# Patient Record
Sex: Female | Born: 1975
Health system: Southern US, Community
[De-identification: ages and names within clinical notes are randomized; demographics above are authoritative.]

## PROBLEM LIST (undated history)

## (undated) DIAGNOSIS — J310 Chronic rhinitis: Secondary | ICD-10-CM

## (undated) DIAGNOSIS — I10 Essential (primary) hypertension: Secondary | ICD-10-CM

## (undated) DIAGNOSIS — M1990 Unspecified osteoarthritis, unspecified site: Secondary | ICD-10-CM

## (undated) DIAGNOSIS — K219 Gastro-esophageal reflux disease without esophagitis: Secondary | ICD-10-CM

## (undated) DIAGNOSIS — N920 Excessive and frequent menstruation with regular cycle: Secondary | ICD-10-CM

## (undated) DIAGNOSIS — L309 Dermatitis, unspecified: Secondary | ICD-10-CM

## (undated) DIAGNOSIS — R21 Rash and other nonspecific skin eruption: Secondary | ICD-10-CM

## (undated) DIAGNOSIS — Z9889 Other specified postprocedural states: Secondary | ICD-10-CM

## (undated) DIAGNOSIS — L292 Pruritus vulvae: Secondary | ICD-10-CM

## (undated) DIAGNOSIS — M069 Rheumatoid arthritis, unspecified: Secondary | ICD-10-CM

## (undated) DIAGNOSIS — M797 Fibromyalgia: Secondary | ICD-10-CM

## (undated) DIAGNOSIS — D649 Anemia, unspecified: Secondary | ICD-10-CM

## (undated) DIAGNOSIS — D219 Benign neoplasm of connective and other soft tissue, unspecified: Secondary | ICD-10-CM

## (undated) DIAGNOSIS — N946 Dysmenorrhea, unspecified: Secondary | ICD-10-CM

## (undated) DIAGNOSIS — IMO0001 Reserved for inherently not codable concepts without codable children: Secondary | ICD-10-CM

## (undated) DIAGNOSIS — R112 Nausea with vomiting, unspecified: Secondary | ICD-10-CM

## (undated) DIAGNOSIS — E559 Vitamin D deficiency, unspecified: Secondary | ICD-10-CM

## (undated) HISTORY — PX: ESOPHAGOGASTRODUODENOSCOPY ENDOSCOPY: SHX5814

## (undated) HISTORY — DX: Benign neoplasm of connective and other soft tissue, unspecified: D21.9

## (undated) HISTORY — DX: Pruritus vulvae: L29.2

## (undated) HISTORY — DX: Dysmenorrhea, unspecified: N94.6

## (undated) HISTORY — DX: Reserved for inherently not codable concepts without codable children: IMO0001

## (undated) HISTORY — DX: Gastro-esophageal reflux disease without esophagitis: K21.9

## (undated) HISTORY — DX: Excessive and frequent menstruation with regular cycle: N92.0

## (undated) HISTORY — DX: Vitamin D deficiency, unspecified: E55.9

## (undated) HISTORY — DX: Chronic rhinitis: J31.0

## (undated) HISTORY — DX: Anemia, unspecified: D64.9

## (undated) HISTORY — DX: Dermatitis, unspecified: L30.9

## (undated) HISTORY — PX: WISDOM TOOTH EXTRACTION: SHX21

## (undated) HISTORY — DX: Rash and other nonspecific skin eruption: R21

---

## 2001-05-26 ENCOUNTER — Emergency Department (HOSPITAL_COMMUNITY): Admission: EM | Admit: 2001-05-26 | Discharge: 2001-05-26 | Payer: Self-pay | Admitting: Emergency Medicine

## 2001-05-26 ENCOUNTER — Encounter: Payer: Self-pay | Admitting: Emergency Medicine

## 2001-06-09 ENCOUNTER — Encounter (HOSPITAL_COMMUNITY): Admission: RE | Admit: 2001-06-09 | Discharge: 2001-07-09 | Payer: Self-pay | Admitting: Family Medicine

## 2006-03-14 ENCOUNTER — Encounter: Admission: RE | Admit: 2006-03-14 | Discharge: 2006-03-14 | Payer: Self-pay | Admitting: Rheumatology

## 2006-04-29 ENCOUNTER — Encounter: Admission: RE | Admit: 2006-04-29 | Discharge: 2006-04-29 | Payer: Self-pay | Admitting: Rheumatology

## 2008-02-06 ENCOUNTER — Other Ambulatory Visit: Admission: RE | Admit: 2008-02-06 | Discharge: 2008-02-06 | Payer: Self-pay | Admitting: Obstetrics and Gynecology

## 2009-02-17 ENCOUNTER — Other Ambulatory Visit: Admission: RE | Admit: 2009-02-17 | Discharge: 2009-02-17 | Payer: Self-pay | Admitting: Obstetrics & Gynecology

## 2010-07-30 ENCOUNTER — Emergency Department (HOSPITAL_COMMUNITY)
Admission: EM | Admit: 2010-07-30 | Discharge: 2010-07-30 | Payer: Self-pay | Source: Home / Self Care | Admitting: Emergency Medicine

## 2010-08-02 ENCOUNTER — Ambulatory Visit (HOSPITAL_COMMUNITY)
Admission: RE | Admit: 2010-08-02 | Discharge: 2010-08-02 | Payer: Self-pay | Source: Home / Self Care | Attending: Family Medicine | Admitting: Family Medicine

## 2010-08-10 ENCOUNTER — Ambulatory Visit (HOSPITAL_COMMUNITY)
Admission: RE | Admit: 2010-08-10 | Discharge: 2010-08-10 | Payer: Self-pay | Source: Home / Self Care | Attending: Internal Medicine | Admitting: Internal Medicine

## 2010-08-10 ENCOUNTER — Ambulatory Visit: Admit: 2010-08-10 | Payer: Self-pay | Admitting: Internal Medicine

## 2010-08-28 NOTE — Op Note (Signed)
  NAMEMELAYA, Burton             ACCOUNT NO.:  [id number]  MEDICAL RECORD NO.:  [id number]          PATIENT TYPE:  AMB  LOCATION:  DAY                           FACILITY:  [facility]  PHYSICIAN:  Lionel December, M.D.    DATE OF BIRTH:  02-12-1976  DATE OF PROCEDURE:  08/10/2010 DATE OF DISCHARGE:                              OPERATIVE REPORT   PROCEDURE:  Esophagogastroduodenoscopy.  INDICATIONS:  Karen Burton is a 35 year old African American female with history of rheumatoid arthritis who has been experiencing mid epigastric pain for the last 2 weeks associated with nausea and early satiety.  She is on PPI chronically for GERD with good control of her heartburn.  Dr. Gerda Diss increased her PPI to double dose, but without symptomatic relief. Her LFTs have been normal and hepatobiliary ultrasound was also unremarkable.  She is on Celebrex and she generally takes 200 mg once a day with meals.  She denies melena or rectal bleeding.  She is undergoing diagnostic EGD.  Procedure risks were reviewed with the patient and informed consent was obtained.  MEDICATIONS FOR CONSCIOUS SEDATION: 1. Cetacaine spray for oropharyngeal topical anesthesia. 2. Demerol 50 mg IV. 3. Versed 5 mg IV in divided dose.  FINDINGS:  Procedure performed in endoscopy suite.  The patient's vital signs and O2 sats were monitored during the procedure and remained stable.  The patient was placed in left lateral recumbent position and Pentax videoscope was passed through oropharynx without any difficulty into esophagus.  Esophagus, mucosa of the esophagus normal.  GE junction was located at 39-cm from the incisors and was unremarkable.  Stomach, it was empty and distended very well with insufflation.  Folds of the proximal stomach are normal.  Examination of mucosa at body was normal.  In the antrum, she had numerous erosions, but none had a depth. Pyloric channel was patent.  Few more erosions were noted over  the angularis.  Fundus and cardia were unremarkable.  Duodenum.  In the bulb, there were few small erosions along with few more erosions involving the second part of the duodenum.  Endoscope was withdrawn.  On the way out, biopsy was taken from these antral erosions.  The patient tolerated the procedure well.  FINAL DIAGNOSES: 1. Multiple antral erosions, possibly related to NSAID therapy would     need to rule out H. pylori infection or other etiologies. 2. Few bulbar and postbulbar erosions.  RECOMMENDATIONS: 1. She will continue omeprazole at 20 mg b.i.d. and for the time-being     continue Celebrex 200 mg once a day with meals. 2. I will be contacting the patient with results of biopsy and further     recommendations.     Lionel December, M.D.     NR/MEDQ  D:  08/10/2010  T:  08/11/2010  Job:  [id number]  cc:   Lorin Picket A. Gerda Diss, MD Fax: [phone]  Pollyann Savoy, M.D. Fax: [phone]  Electronically Signed by Lionel December M.D. on 08/28/2010 07:19:08 AM

## 2010-10-09 LAB — URINALYSIS, ROUTINE W REFLEX MICROSCOPIC
Bilirubin Urine: NEGATIVE
Glucose, UA: NEGATIVE mg/dL
Hgb urine dipstick: NEGATIVE
Ketones, ur: 15 mg/dL — AB
Leukocytes, UA: NEGATIVE
Nitrite: NEGATIVE
Protein, ur: 30 mg/dL — AB
Specific Gravity, Urine: >1.03 — ABNORMAL HIGH (ref 1.005–1.030)
Urobilinogen, UA: 0.2 mg/dL (ref 0.0–1.0)
pH: 6 (ref 5.0–8.0)

## 2010-10-09 LAB — URINE MICROSCOPIC-ADD ON

## 2010-12-20 ENCOUNTER — Other Ambulatory Visit: Payer: Self-pay | Admitting: Adult Health

## 2010-12-20 ENCOUNTER — Other Ambulatory Visit (HOSPITAL_COMMUNITY)
Admission: RE | Admit: 2010-12-20 | Discharge: 2010-12-20 | Disposition: A | Discharge status: Home / Self Care | Payer: BC Managed Care – PPO | Source: Ambulatory Visit | Attending: Obstetrics and Gynecology | Admitting: Obstetrics and Gynecology

## 2010-12-20 ENCOUNTER — Other Ambulatory Visit: Payer: Self-pay | Admitting: Obstetrics & Gynecology

## 2010-12-20 DIAGNOSIS — Z01419 Encounter for gynecological examination (general) (routine) without abnormal findings: Secondary | ICD-10-CM | POA: Insufficient documentation

## 2010-12-20 DIAGNOSIS — Z113 Encounter for screening for infections with a predominantly sexual mode of transmission: Secondary | ICD-10-CM | POA: Insufficient documentation

## 2010-12-20 DIAGNOSIS — N852 Hypertrophy of uterus: Secondary | ICD-10-CM

## 2010-12-26 ENCOUNTER — Ambulatory Visit (HOSPITAL_COMMUNITY): Payer: BC Managed Care – PPO

## 2010-12-26 ENCOUNTER — Ambulatory Visit (HOSPITAL_COMMUNITY)
Admission: RE | Admit: 2010-12-26 | Discharge: 2010-12-26 | Disposition: A | Discharge status: Home / Self Care | Payer: BC Managed Care – PPO | Source: Ambulatory Visit | Attending: Obstetrics & Gynecology | Admitting: Obstetrics & Gynecology

## 2010-12-26 DIAGNOSIS — N852 Hypertrophy of uterus: Secondary | ICD-10-CM | POA: Insufficient documentation

## 2010-12-26 DIAGNOSIS — D259 Leiomyoma of uterus, unspecified: Secondary | ICD-10-CM | POA: Insufficient documentation

## 2012-12-08 ENCOUNTER — Other Ambulatory Visit: Payer: Self-pay | Admitting: Family Medicine

## 2013-01-12 ENCOUNTER — Ambulatory Visit (INDEPENDENT_AMBULATORY_CARE_PROVIDER_SITE_OTHER): Payer: BC Managed Care – PPO | Admitting: Family Medicine

## 2013-01-12 ENCOUNTER — Encounter: Payer: Self-pay | Admitting: Family Medicine

## 2013-01-12 VITALS — BP 124/84 | Wt 148.0 lb

## 2013-01-12 DIAGNOSIS — I10 Essential (primary) hypertension: Secondary | ICD-10-CM | POA: Insufficient documentation

## 2013-01-12 DIAGNOSIS — M069 Rheumatoid arthritis, unspecified: Secondary | ICD-10-CM

## 2013-01-12 DIAGNOSIS — R21 Rash and other nonspecific skin eruption: Secondary | ICD-10-CM | POA: Insufficient documentation

## 2013-01-12 DIAGNOSIS — K219 Gastro-esophageal reflux disease without esophagitis: Secondary | ICD-10-CM

## 2013-01-12 MED ORDER — OMEPRAZOLE 20 MG PO CPDR: 20.0000 mg | DELAYED_RELEASE_CAPSULE | Freq: Every day | ORAL | Status: DC

## 2013-01-12 MED ORDER — TRIAMCINOLONE ACETONIDE 0.1 % EX CREA: TOPICAL_CREAM | Freq: Two times a day (BID) | CUTANEOUS | Status: DC

## 2013-01-12 MED ORDER — AMLODIPINE BESYLATE 5 MG PO TABS: 5.0000 mg | ORAL_TABLET | Freq: Every day | ORAL | Status: DC

## 2013-01-12 MED ORDER — HYDROCHLOROTHIAZIDE 12.5 MG PO CAPS: 12.5000 mg | ORAL_CAPSULE | ORAL | Status: DC

## 2013-01-12 NOTE — Patient Instructions (Signed)
Try to cut down salt intake

## 2013-01-12 NOTE — Progress Notes (Signed)
  Subjective:    Patient ID: Karen Burton, female    DOB: 1975/09/27, 37 y.o.   MRN: [id number]  Hypertension This is a chronic problem. The current episode started more than 1 year ago. The problem has been waxing and waning since onset. Associated symptoms include malaise/fatigue. Pertinent negatives include no blurred vision or chest pain. There are no associated agents to hypertension. Past treatments include calcium channel blockers and diuretics. The current treatment provides moderate improvement. Compliance problems include exercise and diet.  There is no history of angina or CAD/MI. There is no history of chronic renal disease.   Not exercising regularly these days due to rheum arthritis Reflux only sig with tomto sauce. Patient finds that the omeprazole definitely helps her.   patient has had a particularly on her neck. Pruritic in nature. Steroid creams helped considerably in the past.  Ongoing challenges with rheumatoid arthritis. Continue injection. Handling it reasonably well. flareup of her eczema recently.  Review of Systems  Constitutional: Positive for malaise/fatigue.  Eyes: Negative for blurred vision.  Cardiovascular: Negative for chest pain.  ROS otherwise negative      Objective:   Physical Exam    alert no acute distress. HEENT normal. Vitals reviewed. Blood pressure 124/80 on repeat. Lungs clear. Heart regular rate and rhythm. Abdomen benign. Skin reveals eczema changes on the neck.     Assessment & Plan:   impression #1 hypertension good control. #2 reflux clinically stable while on medicines. #3 eczema ongoing with need for refill on meds. Plan diet exercise discussed in encourage. Try to cut down salt intake. Meds refilled. Avoid tomato products. Triamcinolone cream twice a day to affected area. Check every 6 months. Rheumatologist follows blood work closely. WSL

## 2013-02-20 ENCOUNTER — Ambulatory Visit (INDEPENDENT_AMBULATORY_CARE_PROVIDER_SITE_OTHER): Payer: BC Managed Care – PPO | Admitting: Family Medicine

## 2013-02-20 ENCOUNTER — Encounter: Payer: Self-pay | Admitting: Family Medicine

## 2013-02-20 VITALS — BP 138/86 | Temp 98.4°F | Wt 148.0 lb

## 2013-02-20 DIAGNOSIS — Z23 Encounter for immunization: Secondary | ICD-10-CM

## 2013-02-20 DIAGNOSIS — L0291 Cutaneous abscess, unspecified: Secondary | ICD-10-CM

## 2013-02-20 DIAGNOSIS — L0390 Cellulitis, unspecified: Secondary | ICD-10-CM

## 2013-02-20 MED ORDER — HYDROCODONE-ACETAMINOPHEN 5-325 MG PO TABS: 1.0000 | ORAL_TABLET | ORAL | Status: DC | PRN

## 2013-02-20 MED ORDER — DOXYCYCLINE HYCLATE 100 MG PO CAPS: 100.0000 mg | ORAL_CAPSULE | Freq: Two times a day (BID) | ORAL | Status: DC

## 2013-02-20 NOTE — Progress Notes (Signed)
Patient ID: Karen Burton, female   DOB: 1976/05/30, 37 y.o.   MRN: [id number] This patient comes in with the enlarged area tender on the chin causes pain discomfort soreness swelling she denies any injury with it. She states that it is very tender painful keeping her awake at night. Progressive over the past several days. PMH benign. Family history benign. No history of MRSA. She treated it to a spider bite. On physical examination cheeks are normal eardrums normal throat normal neck no masses she does have an enlarged abscess on the left side of the chin.  Procedure-consent was obtained. 1% lidocaine with epinephrine injected into the area. Less than a half cc. #11 blade was used after prepping with Betadine. Small incision was made with a large amount of pus. Culture was sent. Bandage was put on by the nurse. Tetanus shot given.  Abscess-should gradually get better now that it has been drained. Dr. Cecil Cobbs from twice a day for the next 7-10 days await culture warning signs were discussed followup if ongoing trouble cautioned pain medication causes drowsiness cautioned drowsiness.

## 2013-02-23 LAB — WOUND CULTURE: Gram Stain: NONE SEEN

## 2013-03-04 ENCOUNTER — Encounter: Payer: Self-pay | Admitting: *Deleted

## 2013-07-29 ENCOUNTER — Telehealth: Payer: Self-pay | Admitting: Family Medicine

## 2013-07-29 NOTE — Telephone Encounter (Signed)
error 

## 2013-08-15 ENCOUNTER — Encounter (HOSPITAL_COMMUNITY): Payer: Self-pay | Admitting: Emergency Medicine

## 2013-08-15 ENCOUNTER — Emergency Department (HOSPITAL_COMMUNITY)
Admission: EM | Admit: 2013-08-15 | Discharge: 2013-08-15 | Disposition: A | Discharge status: Home / Self Care | Payer: BC Managed Care – PPO | Attending: Emergency Medicine | Admitting: Emergency Medicine

## 2013-08-15 DIAGNOSIS — Z8719 Personal history of other diseases of the digestive system: Secondary | ICD-10-CM | POA: Insufficient documentation

## 2013-08-15 DIAGNOSIS — Z792 Long term (current) use of antibiotics: Secondary | ICD-10-CM | POA: Insufficient documentation

## 2013-08-15 DIAGNOSIS — Z8709 Personal history of other diseases of the respiratory system: Secondary | ICD-10-CM | POA: Insufficient documentation

## 2013-08-15 DIAGNOSIS — K219 Gastro-esophageal reflux disease without esophagitis: Secondary | ICD-10-CM | POA: Insufficient documentation

## 2013-08-15 DIAGNOSIS — Z79899 Other long term (current) drug therapy: Secondary | ICD-10-CM | POA: Insufficient documentation

## 2013-08-15 DIAGNOSIS — Z8639 Personal history of other endocrine, nutritional and metabolic disease: Secondary | ICD-10-CM | POA: Insufficient documentation

## 2013-08-15 DIAGNOSIS — K644 Residual hemorrhoidal skin tags: Secondary | ICD-10-CM | POA: Insufficient documentation

## 2013-08-15 DIAGNOSIS — Z872 Personal history of diseases of the skin and subcutaneous tissue: Secondary | ICD-10-CM | POA: Insufficient documentation

## 2013-08-15 DIAGNOSIS — IMO0002 Reserved for concepts with insufficient information to code with codable children: Secondary | ICD-10-CM | POA: Insufficient documentation

## 2013-08-15 DIAGNOSIS — M129 Arthropathy, unspecified: Secondary | ICD-10-CM | POA: Insufficient documentation

## 2013-08-15 DIAGNOSIS — Z862 Personal history of diseases of the blood and blood-forming organs and certain disorders involving the immune mechanism: Secondary | ICD-10-CM | POA: Insufficient documentation

## 2013-08-15 HISTORY — DX: Unspecified osteoarthritis, unspecified site: M19.90

## 2013-08-15 HISTORY — DX: Fibromyalgia: M79.7

## 2013-08-15 NOTE — ED Notes (Signed)
Rectal bleeding, thinks it may be hemorrhoids but not sure

## 2013-08-15 NOTE — Discharge Instructions (Signed)
Hemorrhoids Hemorrhoids are swollen veins around the rectum or anus. There are two types of hemorrhoids:   Internal hemorrhoids. These occur in the veins just inside the rectum. They may poke through to the outside and become irritated and painful.  External hemorrhoids. These occur in the veins outside the anus and can be felt as a painful swelling or hard lump near the anus. CAUSES  Pregnancy.   Obesity.   Constipation or diarrhea.   Straining to have a bowel movement.   Sitting for long periods on the toilet.  Heavy lifting or other activity that caused you to strain.  Anal intercourse. SYMPTOMS   Pain.   Anal itching or irritation.   Rectal bleeding.   Fecal leakage.   Anal swelling.   One or more lumps around the anus.  DIAGNOSIS  Your caregiver may be able to diagnose hemorrhoids by visual examination. Other examinations or tests that may be performed include:   Examination of the rectal area with a gloved hand (digital rectal exam).   Examination of anal canal using a small tube (scope).   A blood test if you have lost a significant amount of blood.  A test to look inside the colon (sigmoidoscopy or colonoscopy). TREATMENT Most hemorrhoids can be treated at home. However, if symptoms do not seem to be getting better or if you have a lot of rectal bleeding, your caregiver may perform a procedure to help make the hemorrhoids get smaller or remove them completely. Possible treatments include:   Placing a rubber band at the base of the hemorrhoid to cut off the circulation (rubber band ligation).   Injecting a chemical to shrink the hemorrhoid (sclerotherapy).   Using a tool to burn the hemorrhoid (infrared light therapy).   Surgically removing the hemorrhoid (hemorrhoidectomy).   Stapling the hemorrhoid to block blood flow to the tissue (hemorrhoid stapling).  HOME CARE INSTRUCTIONS   Eat foods with fiber, such as whole grains, beans,  nuts, fruits, and vegetables. Ask your doctor about taking products with added fiber in them (fibersupplements).  Increase fluid intake. Drink enough water and fluids to keep your urine clear or pale yellow.   Exercise regularly.   Go to the bathroom when you have the urge to have a bowel movement. Do not wait.   Avoid straining to have bowel movements.   Keep the anal area dry and clean. Use wet toilet paper or moist towelettes after a bowel movement.   Medicated creams and suppositories may be used or applied as directed.   Only take over-the-counter or prescription medicines as directed by your caregiver.   Take warm sitz baths for 15 20 minutes, 3 4 times a day to ease pain and discomfort.   Place ice packs on the hemorrhoids if they are tender and swollen. Using ice packs between sitz baths may be helpful.   Put ice in a plastic bag.   Place a towel between your skin and the bag.   Leave the ice on for 15 20 minutes, 3 4 times a day.   Do not use a donut-shaped pillow or sit on the toilet for long periods. This increases blood pooling and pain.  SEEK MEDICAL CARE IF:  You have increasing pain and swelling that is not controlled by treatment or medicine.  You have uncontrolled bleeding.  You have difficulty or you are unable to have a bowel movement.  You have pain or inflammation outside the area of the hemorrhoids. MAKE SURE YOU:    Understand these instructions.  Will watch your condition.  Will get help right away if you are not doing well or get worse. Document Released: 07/13/2000 Document Revised: 07/02/2012 Document Reviewed: 05/20/2012 [employer] Patient Information 2014 [employer].  

## 2013-08-15 NOTE — ED Provider Notes (Signed)
CSN: [id number]     Arrival date & time 08/15/13  1531 History  This chart was scribed for Virgel Manifold, MD by Jenne Campus, ED Scribe. This patient was seen in room [facility] and the patient's care was started at 5:07 PM.   Chief Complaint  Patient presents with  . Rectal Bleeding    The history is provided by the patient. No language interpreter was used.    HPI Comments: Karen Burton is a 38 y.o. female with a h/o IBS and hemorrhoids who presents to the Emergency Department complaining of rectal bleeding that she noted this morning with mild associated twisting abdominal pain. Pt states that she had a normal BM yesterday without complication. She states that she developed rectal pain after a second BM yesterday which gradually worsened after two to three more subsequent BMs. Pt states that she tried hemorrhoid cream with no improvement. Pt states that she became concerned when she noted a dime sized drop of bright red blood this morning. She believed the bleeding to be from her anus due to noting the blood with wiping both her vaginal and anus separately. She states that she became concerned when she noted a second dime sized area and then a third streak like area of blood on her panties this afternoon. She denies any trouble urinating or recent constipation. She denies having any rectal pain or bleeding when she without a recent accompanied BM.   Past Medical History  Diagnosis Date  . Vitamin D deficiency   . Eczema     mild  . Reflux   . Rhinitis     chonic  . Rash   . Arthritis   . Fibromyalgia    No past surgical history on file. No family history on file. History  Substance Use Topics  . Smoking status: Never Smoker   . Smokeless tobacco: Not on file  . Alcohol Use: Not on file   No OB history provided.  Review of Systems  Gastrointestinal: Positive for abdominal pain and anal bleeding. Negative for nausea, vomiting and constipation.       + for rectal pain   All other systems reviewed and are negative.    Allergies  Ceftin  Home Medications   Current Outpatient Rx  Name  Route  Sig  Dispense  Refill  . amLODipine (NORVASC) 5 MG tablet   Oral   Take 1 tablet (5 mg total) by mouth daily.   30 tablet   5   . DULoxetine (CYMBALTA) 60 MG capsule   Oral   Take 60 mg by mouth daily.         . Golimumab (SIMPONI [place])   Subcutaneous   Inject into the skin. Once a month         . hydrochlorothiazide (MICROZIDE) 12.5 MG capsule   Oral   Take 1 capsule (12.5 mg total) by mouth 1 day or 1 dose.   30 capsule   5     Needs office visit   . omeprazole (PRILOSEC) 20 MG capsule   Oral   Take 1 capsule (20 mg total) by mouth daily.   30 capsule   5   . triamcinolone cream (KENALOG) 0.1 %   Topical   Apply topically 2 (two) times daily.   60 g   2   . doxycycline (VIBRAMYCIN) 100 MG capsule   Oral   Take 1 capsule (100 mg total) by mouth 2 (two) times daily.   20 capsule  0   . HYDROcodone-acetaminophen (NORCO/VICODIN) 5-325 MG per tablet   Oral   Take 1 tablet by mouth every 4 (four) hours as needed for pain.   30 tablet   0   . Multiple Vitamins-Minerals (MULTIVITAMIN WITH MINERALS) tablet   Oral   Take 1 tablet by mouth daily.          Triage Vitals: BP 121/77  Pulse 73  Temp(Src) 99.1 F (37.3 C) (Oral)  Resp 18  Ht 5\' 5"  (1.651 m)  Wt 152 lb (68.947 kg)  BMI 25.29 kg/m2  SpO2 100%  LMP 08/04/2013  Physical Exam  Nursing note and vitals reviewed. Constitutional: She is oriented to person, place, and time. She appears well-developed and well-nourished. No distress.  HENT:  Head: Normocephalic and atraumatic.  Eyes: EOM are normal.  Neck: Normal range of motion.  Cardiovascular: Normal rate, regular rhythm and normal heart sounds.   Pulmonary/Chest: Effort normal and breath sounds normal.  Abdominal: Soft. She exhibits no distension. There is no tenderness.  Genitourinary:  Non-thrombosed external  hemorrhoid at the 9 to 10 o'clock position, no blood present in rectum, chaperone present  Musculoskeletal: Normal range of motion.  Neurological: She is alert and oriented to person, place, and time.  Skin: Skin is warm and dry.  Psychiatric: She has a normal mood and affect. Judgment normal.    ED Course  Procedures (including critical care time)  DIAGNOSTIC STUDIES: Oxygen Saturation is 100% on RA, normal by my interpretation.    COORDINATION OF CARE: 5:12 PM-Discussed treatment plan which includes a rectal exam with pt at bedside and pt agreed to plan.   5:13 PM- Rectal exam performed with chaperone present. Pt tolerated well with minimal discomfort.   5:14 PM-Informed pt of hemorrhoid noted on rectal exam. Discussed discharge plan which includes preparation H and other hemorrhoid creams as well as sitz baths with pt and pt agreed to plan. Also advised pt to follow up as needed and pt agreed. Addressed symptoms to return for with pt.   Labs Review Labs Reviewed - No data to display Imaging Review No results found.  EKG Interpretation   None       MDM   1. External hemorrhoid    38 year old female with a non-thrombosed external hemorrhoid. No active bleeding on my exam. Plan symptomatic treatment. Return precautions were discussed. At this point would follow with PCP. If symptoms persist or worsen may need surgical referral.  I personally preformed the services scribed in my presence. The recorded information has been reviewed is accurate. Virgel Manifold, MD.   Virgel Manifold, MD 08/18/13 [phone]

## 2013-09-25 ENCOUNTER — Other Ambulatory Visit: Payer: Self-pay | Admitting: Family Medicine

## 2013-10-26 ENCOUNTER — Encounter: Payer: Self-pay | Admitting: Family Medicine

## 2013-10-26 ENCOUNTER — Ambulatory Visit (INDEPENDENT_AMBULATORY_CARE_PROVIDER_SITE_OTHER): Payer: BC Managed Care – PPO | Admitting: Family Medicine

## 2013-10-26 VITALS — BP 120/70 | Temp 98.4°F | Ht 65.0 in | Wt 149.0 lb

## 2013-10-26 DIAGNOSIS — M549 Dorsalgia, unspecified: Secondary | ICD-10-CM

## 2013-10-26 MED ORDER — ETODOLAC 400 MG PO TABS: 400.0000 mg | ORAL_TABLET | Freq: Two times a day (BID) | ORAL | Status: DC

## 2013-10-26 MED ORDER — CHLORZOXAZONE 500 MG PO TABS: 500.0000 mg | ORAL_TABLET | Freq: Three times a day (TID) | ORAL | Status: DC | PRN

## 2013-10-26 MED ORDER — AMLODIPINE BESYLATE 5 MG PO TABS: ORAL_TABLET | ORAL | Status: DC

## 2013-10-26 MED ORDER — HYDROCHLOROTHIAZIDE 12.5 MG PO CAPS: ORAL_CAPSULE | ORAL | Status: DC

## 2013-10-26 MED ORDER — HYDROCODONE-ACETAMINOPHEN 5-325 MG PO TABS: 1.0000 | ORAL_TABLET | Freq: Four times a day (QID) | ORAL | Status: DC | PRN

## 2013-10-26 NOTE — Progress Notes (Signed)
   Subjective:    Patient ID: Karen Burton, female    DOB: [birth year], 38 y.o.   MRN: [id number]  Back Pain This is a new problem. The current episode started in the past 7 days. The problem occurs intermittently. The problem is unchanged. The pain is present in the gluteal. The quality of the pain is described as stabbing. The pain does not radiate. The pain is at a severity of 10/10. The pain is moderate. The pain is the same all the time. The symptoms are aggravated by bending. She has tried NSAIDs for the symptoms. The treatment provided no relief.   Patient also needs a refill on her amlodipine. Patient has been instructed that she needs to schedule a appointment at the front desk when she leaves today for a chronic office visit. Patient was advised that the back pain would only be addressed at this visit because it is a same day visit. Patient verbalized understanding. Helping mother with fluids etc.  Severe right lumbar pain, fairly severe and sharp  Pt took trhee tyl  Tried a tramadol orig given via rheumatolgist  Substantial pain righ lumbar, region, stiff and sore and tender and worse with motion Bad pain with flewion of neck and thorax  Feels loopy and drowsy on tramadol  Did not rad down the leg, but felt bad  Exercise,  Rheum sees every four mo or so  Down to just a half tab of pred  Review of Systems  Musculoskeletal: Positive for back pain.   Chest pain no vomiting no diarrhea    Objective:   Physical Exam  Alert moderate malaise. Lungs clear. Heart regular in rhythm. Right lumbar region tender to palpation. Negative straight leg raise. Negative spinal tenderness. Negative CVAT      Assessment & Plan:  Impression lumbar strain plan Lodine twice a day with food. Chlorzoxazone 3 times a day. Local measures discussed. WSL

## 2013-10-26 NOTE — Patient Instructions (Signed)
Back Exercises Back exercises help treat and prevent back injuries. The goal of back exercises is to increase the strength of your abdominal and back muscles and the flexibility of your back. These exercises should be started when you no longer have back pain. Back exercises include:  Pelvic Tilt. Lie on your back with your knees bent. Tilt your pelvis until the lower part of your back is against the floor. Hold this position 5 to 10 sec and repeat 5 to 10 times.  Knee to Chest. Pull first 1 knee up against your chest and hold for 20 to 30 seconds, repeat this with the other knee, and then both knees. This may be done with the other leg straight or bent, whichever feels better.  Sit-Ups or Curl-Ups. Bend your knees 90 degrees. Start with tilting your pelvis, and do a partial, slow sit-up, lifting your trunk only 30 to 45 degrees off the floor. Take at least 2 to 3 seconds for each sit-up. Do not do sit-ups with your knees out straight. If partial sit-ups are difficult, simply do the above but with only tightening your abdominal muscles and holding it as directed.  Hip-Lift. Lie on your back with your knees flexed 90 degrees. Push down with your feet and shoulders as you raise your hips a couple inches off the floor; hold for 10 seconds, repeat 5 to 10 times.  Back arches. Lie on your stomach, propping yourself up on bent elbows. Slowly press on your hands, causing an arch in your low back. Repeat 3 to 5 times. Any initial stiffness and discomfort should lessen with repetition over time.  Shoulder-Lifts. Lie face down with arms beside your body. Keep hips and torso pressed to floor as you slowly lift your head and shoulders off the floor. Do not overdo your exercises, especially in the beginning. Exercises may cause you some mild back discomfort which lasts for a few minutes; however, if the pain is more severe, or lasts for more than 15 minutes, do not continue exercises until you see your caregiver.  Improvement with exercise therapy for back problems is slow.  See your caregivers for assistance with developing a proper back exercise program. Document Released: 08/23/2004 Document Revised: 10/08/2011 Document Reviewed: 05/17/2011 [employer] Patient Information 2014 [employer].  

## 2013-11-16 ENCOUNTER — Ambulatory Visit (INDEPENDENT_AMBULATORY_CARE_PROVIDER_SITE_OTHER): Payer: BC Managed Care – PPO | Admitting: Family Medicine

## 2013-11-16 ENCOUNTER — Encounter: Payer: Self-pay | Admitting: Family Medicine

## 2013-11-16 VITALS — BP 122/72 | Ht 65.0 in | Wt 151.0 lb

## 2013-11-16 DIAGNOSIS — I10 Essential (primary) hypertension: Secondary | ICD-10-CM

## 2013-11-16 DIAGNOSIS — K219 Gastro-esophageal reflux disease without esophagitis: Secondary | ICD-10-CM

## 2013-11-16 MED ORDER — HYDROCHLOROTHIAZIDE 12.5 MG PO CAPS: ORAL_CAPSULE | ORAL | Status: DC

## 2013-11-16 MED ORDER — AMLODIPINE BESYLATE 5 MG PO TABS: ORAL_TABLET | ORAL | Status: DC

## 2013-11-16 NOTE — Progress Notes (Signed)
   Subjective:    Patient ID: Karen Burton, female    DOB: 1976/01/02, 38 y.o.   MRN: [id number]  HPI  Patient is here today for a med check.  She needs a refill on her HTN meds.  Pt was seen here for back pain in March, and states her pain is doing a lot better.  Back overall is sig better  bp med faithfully, watches salt intake, Often prepared meals tho whch has sig salt  BP usually pretty good on the med  cymbalta has helped rxed by dr deveshwar every three mo, tries to walk with clients regularly  Patient reports low back pain is better. Less discomfort. Able to get around now. Generally does not exercise with her back.  Sees the rheumatologist every 3 months.  Review of Systems No chest pain no headache no abdominal pain no change in bowel habits no blood in stool ROS otherwise negative    Objective:   Physical Exam  Alert no apparent distress blood pressure excellent H&T normal. Lungs clear. Heart regular in rhythm. Back no particular tenderness abdomen benign      Assessment & Plan:  Impression 1 hypertension good control. #2 reflux clinically stable plan medications refilled. Diet exercise discussed. Check every 6 months. WSL

## 2014-04-07 ENCOUNTER — Encounter: Payer: Self-pay | Admitting: Family Medicine

## 2014-04-07 ENCOUNTER — Ambulatory Visit (INDEPENDENT_AMBULATORY_CARE_PROVIDER_SITE_OTHER): Payer: BC Managed Care – PPO | Admitting: Family Medicine

## 2014-04-07 VITALS — BP 118/80 | Temp 98.4°F | Ht 65.5 in | Wt 150.0 lb

## 2014-04-07 DIAGNOSIS — J329 Chronic sinusitis, unspecified: Secondary | ICD-10-CM

## 2014-04-07 DIAGNOSIS — J310 Chronic rhinitis: Secondary | ICD-10-CM

## 2014-04-07 MED ORDER — AMOXICILLIN-POT CLAVULANATE 875-125 MG PO TABS: 1.0000 | ORAL_TABLET | Freq: Two times a day (BID) | ORAL | Status: AC

## 2014-04-07 NOTE — Progress Notes (Signed)
   Subjective:    Patient ID: Karen Burton, female    DOB: March 24, 1976, 38 y.o.   MRN: [id number]  Sinusitis This is a new problem. The current episode started 1 to 4 weeks ago. Associated symptoms include coughing, ear pain, headaches and sneezing. (Sinus drainage, nausea) Past treatments include acetaminophen (mucinex, benadryl, aleve).   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   Respiratory: Positive for cough.   Neurological: Positive for headaches.   no vomiting or diarrhea     Objective:   Physical Exam Alert mild malaise. Vital stable HEENT moderate nasal congestion frontal tenderness. Pharynx some erythema neck supple. Lungs clear. Heart regular rate and rhythm.       Assessment & Plan:  Impression 1 rhinosinusitis, wondered if patient also has element of tension headaches with considerable recent stress. Discussed. Plan antibiotics prescribed. Symptomatic care discussed. Warning signs discussed. WSL

## 2014-05-17 ENCOUNTER — Ambulatory Visit: Payer: BC Managed Care – PPO | Admitting: Family Medicine

## 2014-09-10 ENCOUNTER — Ambulatory Visit (INDEPENDENT_AMBULATORY_CARE_PROVIDER_SITE_OTHER): Payer: BLUE CROSS/BLUE SHIELD | Admitting: Family Medicine

## 2014-09-10 ENCOUNTER — Encounter: Payer: Self-pay | Admitting: Family Medicine

## 2014-09-10 VITALS — BP 132/94 | Ht 65.5 in | Wt 152.0 lb

## 2014-09-10 DIAGNOSIS — I10 Essential (primary) hypertension: Secondary | ICD-10-CM

## 2014-09-10 MED ORDER — AMLODIPINE BESYLATE 5 MG PO TABS: ORAL_TABLET | ORAL | Status: DC

## 2014-09-10 MED ORDER — HYDROCHLOROTHIAZIDE 12.5 MG PO CAPS: ORAL_CAPSULE | ORAL | Status: DC

## 2014-09-10 NOTE — Progress Notes (Signed)
   Subjective:    Patient ID: Karen Burton, female    DOB: [birth year], 39 y.o.   MRN: [id number]  Hypertension This is a chronic problem. The current episode started more than 1 year ago. There are no compliance problems.   needs refill on amlodipine and HCTZ.  soreness on left side of neck. Started yesterday.   Exercising off and on, walks some   School working on the nursing degree, Higher education careers adviser,  woring plus full time  Tries to eat the right things  Tends to eat micro wave dinners  Eating more take outch nugg off and on  Sees dr devawhswar four to five months,   Review of Systems No headache no chest pain no back pain no abdominal pain no change in bowel habits    Objective:   Physical Exam Alert no acute distress. Lungs clear. Heart regular in rhythm. H&T in normal.       Assessment & Plan:  Impression 1 hypertension good control #2 nonspecific neck pain muscular skeletal plan medications refilled. Diet discussed. Exercise discussed. Recheck in 6 months. WSL

## 2014-10-01 ENCOUNTER — Encounter: Payer: Self-pay | Admitting: Family Medicine

## 2014-10-01 ENCOUNTER — Ambulatory Visit (INDEPENDENT_AMBULATORY_CARE_PROVIDER_SITE_OTHER): Payer: BLUE CROSS/BLUE SHIELD | Admitting: Family Medicine

## 2014-10-01 VITALS — BP 114/80 | Temp 98.0°F | Ht 65.5 in | Wt 154.0 lb

## 2014-10-01 DIAGNOSIS — J310 Chronic rhinitis: Secondary | ICD-10-CM

## 2014-10-01 DIAGNOSIS — J329 Chronic sinusitis, unspecified: Secondary | ICD-10-CM

## 2014-10-01 MED ORDER — AMOXICILLIN-POT CLAVULANATE 875-125 MG PO TABS: 1.0000 | ORAL_TABLET | Freq: Two times a day (BID) | ORAL | Status: DC

## 2014-10-01 NOTE — Progress Notes (Signed)
   Subjective:    Patient ID: Karen Burton, female    DOB: April 30, 1976, 39 y.o.   MRN: [id number]  Cough This is a new problem. The current episode started 1 to 4 weeks ago. The problem has been unchanged. The problem occurs every few hours. The cough is non-productive. Associated symptoms include headaches. Associated symptoms comments: Nausea, sneezing. Nothing aggravates the symptoms. Treatments tried: tylenol, aleve, nyquil, dayquil. The treatment provided no relief.   Patient states that she has no other concerns at this time.  Frontal cong and gunkiness  Diminished energy  achey at times   Deep cough  Review of Systems  Respiratory: Positive for cough.   Neurological: Positive for headaches.       Objective:   Physical Exam  Alert vital stable. Moderate malaise. Frontal maxillary tenderness evident pharynx erythematous. Neck supple lungs clear. Heart regular in rhythm.      Assessment & Plan:  Impression acute rhinosinusitis possible element of bronchitis plan antibiotics prescribed. Symptom care discussed. Warning signs discussed. WSL

## 2014-11-08 ENCOUNTER — Ambulatory Visit (INDEPENDENT_AMBULATORY_CARE_PROVIDER_SITE_OTHER): Payer: BLUE CROSS/BLUE SHIELD | Admitting: Family Medicine

## 2014-11-08 ENCOUNTER — Encounter: Payer: Self-pay | Admitting: Family Medicine

## 2014-11-08 VITALS — BP 138/90 | Temp 98.4°F | Ht 65.5 in | Wt 153.0 lb

## 2014-11-08 DIAGNOSIS — J301 Allergic rhinitis due to pollen: Secondary | ICD-10-CM

## 2014-11-08 DIAGNOSIS — J019 Acute sinusitis, unspecified: Secondary | ICD-10-CM

## 2014-11-08 DIAGNOSIS — B9689 Other specified bacterial agents as the cause of diseases classified elsewhere: Secondary | ICD-10-CM

## 2014-11-08 MED ORDER — LEVOFLOXACIN 500 MG PO TABS: 500.0000 mg | ORAL_TABLET | Freq: Every day | ORAL | Status: DC

## 2014-11-08 NOTE — Progress Notes (Signed)
   Subjective:    Patient ID: Karen Burton, female    DOB: 1976-07-11, 39 y.o.   MRN: [id number]  Sinus Problem This is a new problem. The current episode started in the past 7 days. Associated symptoms include congestion, coughing, ear pain, headaches and a sore throat. Pertinent negatives include no shortness of breath. Treatments tried: OTC cold med.   Started thursady  No seasonal allergies Low fever 99.7   Review of Systems  Constitutional: Negative for fever and activity change.  HENT: Positive for congestion, ear pain, rhinorrhea and sore throat.   Eyes: Negative for discharge.  Respiratory: Positive for cough. Negative for shortness of breath and wheezing.   Cardiovascular: Negative for chest pain.  Neurological: Positive for headaches.       Objective:   Physical Exam  Constitutional: She appears well-developed.  HENT:  Head: Normocephalic.  Nose: Nose normal.  Mouth/Throat: Oropharynx is clear and moist. No oropharyngeal exudate.  Neck: Neck supple.  Cardiovascular: Normal rate and normal heart sounds.   No murmur heard. Pulmonary/Chest: Effort normal and breath sounds normal. She has no wheezes.  Lymphadenopathy:    She has no cervical adenopathy.  Skin: Skin is warm and dry.  Nursing note and vitals reviewed.         Assessment & Plan:  Viral syndrome Secondary sinusitis Antibiotics prescribed Warning signs discussed Follow-up if problems

## 2015-04-08 ENCOUNTER — Encounter: Payer: Self-pay | Admitting: Family Medicine

## 2015-04-08 ENCOUNTER — Ambulatory Visit (INDEPENDENT_AMBULATORY_CARE_PROVIDER_SITE_OTHER): Payer: BLUE CROSS/BLUE SHIELD | Admitting: Family Medicine

## 2015-04-08 VITALS — BP 120/80 | Ht 65.5 in | Wt 153.1 lb

## 2015-04-08 DIAGNOSIS — M129 Arthropathy, unspecified: Secondary | ICD-10-CM

## 2015-04-08 DIAGNOSIS — M19071 Primary osteoarthritis, right ankle and foot: Secondary | ICD-10-CM

## 2015-04-08 MED ORDER — PREDNISONE 20 MG PO TABS: ORAL_TABLET | ORAL | Status: DC

## 2015-04-08 NOTE — Progress Notes (Signed)
   Subjective:    Patient ID: Karen Burton, female    DOB: 09-05-1975, 39 y.o.   MRN: [id number]  Ankle Pain  The incident occurred more than 1 week ago. The incident occurred at home. There was no injury mechanism. The pain is present in the right ankle. The quality of the pain is described as aching. The pain is moderate. She reports no foreign bodies present. The symptoms are aggravated by weight bearing. She has tried NSAIDs and acetaminophen for the symptoms. The treatment provided mild relief.   Patient states that she no other concerns at this time.   Takes bp meds primarily at night,  Started last weekend, very bad, could hardly walk  Hobbled around, throbbing and aching. Could not put heardly any wight on it   Rheum arthritis mostly in the wrists and sometimes in the knees  Usually with rheum arthritis other joints swell but this one has not   Tylenol and aleave advil causes some s e 's  To reflux    Review of Systems No fever no chills no injury    Objective:   Physical Exam  Alert vitals stable. Blood pressure good on repeat. Lungs clear. Heart regular in rhythm. HEENT normal. Ankles without edema. However tenderness and pain with involved ankle with flexion and extension      Assessment & Plan:  Impression #1 ankle arthritis. Likely related to rheumatoid arthritis discussed #2 hypertension stable plan prednisone taper. Local measures discussed WSL

## 2015-07-14 ENCOUNTER — Encounter: Payer: Self-pay | Admitting: Family Medicine

## 2015-07-14 ENCOUNTER — Ambulatory Visit (INDEPENDENT_AMBULATORY_CARE_PROVIDER_SITE_OTHER): Payer: BLUE CROSS/BLUE SHIELD | Admitting: Family Medicine

## 2015-07-14 VITALS — Temp 98.5°F | Ht 65.5 in | Wt 153.8 lb

## 2015-07-14 DIAGNOSIS — J019 Acute sinusitis, unspecified: Secondary | ICD-10-CM | POA: Diagnosis not present

## 2015-07-14 DIAGNOSIS — B9689 Other specified bacterial agents as the cause of diseases classified elsewhere: Secondary | ICD-10-CM

## 2015-07-14 MED ORDER — AMOXICILLIN-POT CLAVULANATE 875-125 MG PO TABS: 1.0000 | ORAL_TABLET | Freq: Two times a day (BID) | ORAL | Status: AC

## 2015-07-14 NOTE — Progress Notes (Signed)
   Subjective:    Patient ID: Karen Burton, female    DOB: 11-09-1975, 39 y.o.   MRN: [id number]  Sore Throat  This is a new problem. The current episode started yesterday. Associated symptoms include abdominal pain, congestion, ear pain and headaches. Associated symptoms comments: Body aches. She has tried acetaminophen (benadryl) for the symptoms.   Nose running off and on last wk, nostril dry  Sickness hit hard yest  Throat hurt bad, chest achey and feeling weird  Felt cong in th e chest  Energy level not good   Muscle aches and headaches  feeling rough    Review of Systems  HENT: Positive for congestion and ear pain.   Gastrointestinal: Positive for abdominal pain.  Neurological: Positive for headaches.       Objective:   Physical Exam   Alert moderate malaise hydration good HEENT moderate nasal congestion frontal neck supple lungs occasional rhonchi heart regular in     Assessment & Plan:  Impression 1 post viral rhinosinusitis/bronchitis plan antibiotics prescribed. Symptom care discussed warning signs discussed WSL

## 2015-08-12 ENCOUNTER — Other Ambulatory Visit: Payer: Self-pay | Admitting: Family Medicine

## 2015-09-16 ENCOUNTER — Encounter: Payer: Self-pay | Admitting: Family Medicine

## 2015-09-16 ENCOUNTER — Ambulatory Visit (INDEPENDENT_AMBULATORY_CARE_PROVIDER_SITE_OTHER): Payer: BLUE CROSS/BLUE SHIELD | Admitting: Family Medicine

## 2015-09-16 VITALS — BP 120/76 | Ht 65.5 in | Wt 150.4 lb

## 2015-09-16 DIAGNOSIS — R071 Chest pain on breathing: Secondary | ICD-10-CM

## 2015-09-16 DIAGNOSIS — I10 Essential (primary) hypertension: Secondary | ICD-10-CM

## 2015-09-16 DIAGNOSIS — J301 Allergic rhinitis due to pollen: Secondary | ICD-10-CM

## 2015-09-16 NOTE — Progress Notes (Signed)
   Subjective:    Patient ID: Karen Burton, female    DOB: 29-Nov-1975, 40 y.o.   MRN: [id number] Patient presents with several distinct concerns   Hypertension This is a chronic problem. The current episode started more than 1 year ago. The problem has been gradually improving since onset. There are no associated agents to hypertension. There are no known risk factors for coronary artery disease. Treatments tried: amlodipine, hctz. The current treatment provides moderate improvement. There are no compliance problems.   BP night time compliance, taking meds regulaly    Patient has a pain in her upper abdomen., upper mid abd tend erness,no lifting, no sig exercise, cp sharp and transient   noticedf when stretching,    Patient has some sinus issues also. (Dry nose, slight congestion), tendency to get congested or stuffy or nasal disch also got sore s in the nose, got dry, used saline spray  Sore and tender   Review of Systems No headache no chest pain other than that noted in present illness no back pain positive joint pain from rheumatoid arthritis no change in bowel habits    Objective:   Physical Exam Alert no major distress. Vitals stable blood pressure good on repeat. HEENT slight nasal congestion lungs clear. Heart regular in rhythm. Tenderness at the xiphoid process no abdominal tenderness per se no costal margin tenderness per se       Assessment & Plan:  Impression 1 hypertension good control meds reviewed to maintain same #2 chronic rhinitis primarily dry nasal passages #3 chest pain cartilage inflammation at site of xiphoid process plan anti-inflammatory medicine when necessary she recalls no injury or overuse symptom care discussed. From nasal passages saline hydration and intermittent antibiotics if necessary. Blood pressure meds refilled recheck in 6 months diet exercise discussed WSL

## 2015-09-27 ENCOUNTER — Other Ambulatory Visit: Payer: Self-pay | Admitting: Family Medicine

## 2015-12-20 ENCOUNTER — Encounter: Payer: Self-pay | Admitting: Adult Health

## 2015-12-20 ENCOUNTER — Ambulatory Visit (INDEPENDENT_AMBULATORY_CARE_PROVIDER_SITE_OTHER): Payer: BLUE CROSS/BLUE SHIELD | Admitting: Adult Health

## 2015-12-20 ENCOUNTER — Other Ambulatory Visit (HOSPITAL_COMMUNITY)
Admission: RE | Admit: 2015-12-20 | Discharge: 2015-12-20 | Disposition: A | Discharge status: Home / Self Care | Payer: BLUE CROSS/BLUE SHIELD | Source: Ambulatory Visit | Attending: Adult Health | Admitting: Adult Health

## 2015-12-20 VITALS — BP 130/90 | HR 86 | Ht 65.0 in | Wt 146.5 lb

## 2015-12-20 DIAGNOSIS — Z01419 Encounter for gynecological examination (general) (routine) without abnormal findings: Secondary | ICD-10-CM | POA: Diagnosis present

## 2015-12-20 DIAGNOSIS — Z01411 Encounter for gynecological examination (general) (routine) with abnormal findings: Secondary | ICD-10-CM

## 2015-12-20 DIAGNOSIS — D500 Iron deficiency anemia secondary to blood loss (chronic): Secondary | ICD-10-CM | POA: Diagnosis not present

## 2015-12-20 DIAGNOSIS — Z1151 Encounter for screening for human papillomavirus (HPV): Secondary | ICD-10-CM | POA: Insufficient documentation

## 2015-12-20 DIAGNOSIS — D219 Benign neoplasm of connective and other soft tissue, unspecified: Secondary | ICD-10-CM

## 2015-12-20 DIAGNOSIS — N920 Excessive and frequent menstruation with regular cycle: Secondary | ICD-10-CM | POA: Diagnosis not present

## 2015-12-20 DIAGNOSIS — N946 Dysmenorrhea, unspecified: Secondary | ICD-10-CM | POA: Insufficient documentation

## 2015-12-20 DIAGNOSIS — L292 Pruritus vulvae: Secondary | ICD-10-CM | POA: Diagnosis not present

## 2015-12-20 DIAGNOSIS — D259 Leiomyoma of uterus, unspecified: Secondary | ICD-10-CM

## 2015-12-20 DIAGNOSIS — Z1212 Encounter for screening for malignant neoplasm of rectum: Secondary | ICD-10-CM | POA: Diagnosis not present

## 2015-12-20 DIAGNOSIS — D649 Anemia, unspecified: Secondary | ICD-10-CM | POA: Insufficient documentation

## 2015-12-20 HISTORY — DX: Pruritus vulvae: L29.2

## 2015-12-20 HISTORY — DX: Benign neoplasm of connective and other soft tissue, unspecified: D21.9

## 2015-12-20 HISTORY — DX: Excessive and frequent menstruation with regular cycle: N92.0

## 2015-12-20 HISTORY — DX: Dysmenorrhea, unspecified: N94.6

## 2015-12-20 LAB — HEMOCCULT GUIAC POC 1CARD (OFFICE): FECAL OCCULT BLD: NEGATIVE

## 2015-12-20 MED ORDER — NYSTATIN-TRIAMCINOLONE 100000-0.1 UNIT/GM-% EX CREA
1.0000 | TOPICAL_CREAM | Freq: Two times a day (BID) | CUTANEOUS | Status: DC
Start: 2015-12-20 — End: 2018-01-16

## 2015-12-20 NOTE — Patient Instructions (Signed)
[place] next week then see me 2-3 days later Physical in 1 year Pap in 3 if normal Get mammogram at [facility], [phone]

## 2015-12-20 NOTE — Progress Notes (Signed)
Patient ID: Karen Burton, female   DOB: [birth year], 40 y.o.   MRN: [id number] History of Present Illness: Karen Burton is a 40 year old black female, in for well woman gyn exam and pap and complains of heavy period and cramps with clots, has pain left leg with period and ?rash and itch left labia after period.Periods last about 7 days and heavy first 2-3 days, she wears depends at night. PCP is Dr Mickie Hillier and she sees Dr Marjory Lies for RA. Her HGB was 9.0 and HCT 29.2,12/02/15 at Dr Arma Heading office.   Current Medications, Allergies, Past Medical History, Past Surgical History, Family History and Social History were reviewed in [employer] record.     Review of Systems: Patient denies any headaches, hearing loss, fatigue, blurred vision, shortness of breath, chest pain, problems with bowel movements, urination, or intercourse(not having sex at present). No mood swings.    Physical Exam:BP 130/90 mmHg  Pulse 86  Ht 5\' 5"  (1.651 m)  Wt 146 lb 8 oz (66.452 kg)  BMI 24.38 kg/m2  LMP 12/11/2015 General:  Well developed, well nourished, no acute distress Skin:  Warm and dry Neck:  Midline trachea, normal thyroid, good ROM, no lymphadenopathy Lungs; Clear to auscultation bilaterally Breast:  No dominant palpable mass, retraction, or nipple discharge Cardiovascular: Regular rate and rhythm Abdomen:  Soft, non tender, no hepatosplenomegaly Pelvic:  External genitalia is normal in appearance, no lesions.  The vagina is normal in appearance. Urethra has no lesions or masses. The cervix is smooth, pap with HPV performed.  Uterus is felt to be enlarged with ?fibroid on right.  No adnexal masses or tenderness noted.Bladder is non tender, no masses felt. Rectal: Good sphincter tone, no polyps, or hemorrhoids felt.  Hemoccult negative. Extremities/musculoskeletal:  No swelling or varicosities noted, no clubbing or cyanosis Psych:  No mood changes, alert and cooperative,seems  happy   Impression: Well woman gyn exam and pap Menorrhagia Dysmenorrhea Fibroids Vulva itch Anemia     Plan: Gyn [place] next week to assess uterus See me 2-3 days after [place] to discuss options Rx mytrex cream use 2-3 x daily prn with 2 refills Physical in 1 year, pap in 3 if normal  Get mammogram now and yearly, number given to [facility]

## 2015-12-21 ENCOUNTER — Other Ambulatory Visit: Payer: Self-pay | Admitting: Adult Health

## 2015-12-21 DIAGNOSIS — Z1231 Encounter for screening mammogram for malignant neoplasm of breast: Secondary | ICD-10-CM

## 2015-12-21 LAB — CYTOLOGY - PAP

## 2015-12-22 ENCOUNTER — Encounter: Payer: Self-pay | Admitting: Family Medicine

## 2015-12-22 ENCOUNTER — Ambulatory Visit (INDEPENDENT_AMBULATORY_CARE_PROVIDER_SITE_OTHER): Payer: BLUE CROSS/BLUE SHIELD | Admitting: Family Medicine

## 2015-12-22 VITALS — BP 122/80 | Resp 15 | Ht 66.0 in | Wt 146.2 lb

## 2015-12-22 DIAGNOSIS — Z139 Encounter for screening, unspecified: Secondary | ICD-10-CM

## 2015-12-22 DIAGNOSIS — D649 Anemia, unspecified: Secondary | ICD-10-CM | POA: Diagnosis not present

## 2015-12-22 DIAGNOSIS — I10 Essential (primary) hypertension: Secondary | ICD-10-CM | POA: Diagnosis not present

## 2015-12-22 DIAGNOSIS — M05711 Rheumatoid arthritis with rheumatoid factor of right shoulder without organ or systems involvement: Secondary | ICD-10-CM | POA: Diagnosis not present

## 2015-12-22 NOTE — Progress Notes (Signed)
   Subjective:    Patient ID: Karen Burton, female    DOB: Feb 15, 1976, 40 y.o.   MRN: [id number]  HPI The patient comes in today for a wellness visit. On further history does had a preventative women's visit with her OB/GYN just 48 hours ago. Discussed with patient that her insurance will not allow for 2 of them. In any rate patient has several substantial issues to discuss which warrants a separate visit anyway  Patient is developed progressive anemia. Hemoglobin quite low at times. Having particularly heavy cycles. History of fibroids. No recent ultrasound. One to be done soon.  Patient feels some fatigue and tiredness with her anemia.  Also notes on therapy for rheumatoid arthritis. Patient was not aware rheumatoid arthritis is also associated with anemia.  Patient needs a form filled out for school. Going back to nursing. Up-to-date on some of her vaccines but not on others.  A review of their health history was completed.  A review of medications was also completed.  Any needed refills; None  Eating habits: Patient states eats well. Tries to eat more fresh fruit, and vegetables. Eats lean meats.  Falls/  MVA accidents in past few months: None   Regular exercise: Patient states walks 30 minutes daily.  Specialist pt sees on regular basis: Rheumatology.  Preventative health issues were discussed.   Additional concerns: Patient would like to discuss results of Rheumatology.  Had female physical at GYN  Review of Systems Positive joint pain positive fatigue no headache no chest pain no abdominal pain no change in bowel habits no blood in stool ROS otherwise negative    Objective:   Physical Exam  Alert vitals stable. Blood pressure good on repeat. HEENT normal. Lungs clear. Heart regular in rhythm. Ankles without edema. Some chronic changes noted joints and hands      Assessment & Plan:  Impression anemia multi-factorial but most likely significant etiology is iron  deficiency anemia. Discussed at length. Needs to press on with workup of fibroids. Also needs additional blood work. Also reviewed done of all requirements for nursing done in presence of patient today. 25 minutes with the patient most in discussion of her complex needs. She needs a second MMR [facility]. Patient's had TB blood work which should suffice for testing. She needs testing of both varus Ella and rubella. WSL

## 2015-12-22 NOTE — Patient Instructions (Signed)
TB blood work should suffice for your tb testing  Two MMR ar required  Rubella and varicella blood work is now mandatory  Ideally, you would wait after your second MMR tat least a month to ck the rubella titer stuatus but you dont have the luxury of time here  Iron gluconate tablets one twice per day faith fully  Also stay with the vit yu are on   Take one scoop of miralax faithfully

## 2015-12-23 LAB — VARICELLA ZOSTER ABS, IGG/IGM: VARICELLA: 1420 {index} (ref >165)

## 2015-12-23 LAB — RUBELLA SCREEN: Rubella Antibodies, IGG: 4.79 {index}

## 2015-12-23 LAB — FERRITIN: Ferritin: 8 ng/mL — ABNORMAL LOW (ref 15–150)

## 2015-12-30 ENCOUNTER — Telehealth: Payer: Self-pay | Admitting: Family Medicine

## 2015-12-30 ENCOUNTER — Ambulatory Visit (INDEPENDENT_AMBULATORY_CARE_PROVIDER_SITE_OTHER): Payer: BLUE CROSS/BLUE SHIELD

## 2015-12-30 DIAGNOSIS — N946 Dysmenorrhea, unspecified: Secondary | ICD-10-CM

## 2015-12-30 DIAGNOSIS — D259 Leiomyoma of uterus, unspecified: Secondary | ICD-10-CM

## 2015-12-30 DIAGNOSIS — N920 Excessive and frequent menstruation with regular cycle: Secondary | ICD-10-CM

## 2015-12-30 NOTE — Progress Notes (Signed)
Pelvic/TV US today for AUB and known fibroids.  Anteverted uterus with numerous fibroids. The uterus measures 9.0 x 6.1 x 9.2 cm. Endometrium is thickened at 18.4 mm. Bilateral ovaries appear normal and mobile.  The four most distinctive fibroids measure as follows: Fib #1: Right, 4.9 x 4.6 x 4.7 cm.  Fib #2: Lateral Right, 4.3 x 4.0 x 3.8 cm. Fib #3: Anterior Fundus, 4.6 x 4.3x 4.1 cm.  Fib #4: Left Posterior, 3.7 x. 3.3 x 3.2 cm.

## 2015-12-30 NOTE — Telephone Encounter (Signed)
Pt dropped off a form to be filled out. Form was given to [facility] to be filled in.

## 2015-12-30 NOTE — Telephone Encounter (Signed)
Immunization portion filled out based off of vaccine information provided on shot record.Form forwarded to Dr.Steve for signature on immunization section of form. Form in Yellow folder in Dr.Steve's office.

## 2016-01-02 ENCOUNTER — Ambulatory Visit (HOSPITAL_COMMUNITY)
Admission: RE | Admit: 2016-01-02 | Discharge: 2016-01-02 | Disposition: A | Discharge status: Home / Self Care | Payer: BLUE CROSS/BLUE SHIELD | Source: Ambulatory Visit | Attending: Adult Health | Admitting: Adult Health

## 2016-01-02 DIAGNOSIS — Z1231 Encounter for screening mammogram for malignant neoplasm of breast: Secondary | ICD-10-CM

## 2016-01-03 ENCOUNTER — Ambulatory Visit (INDEPENDENT_AMBULATORY_CARE_PROVIDER_SITE_OTHER): Payer: BLUE CROSS/BLUE SHIELD | Admitting: Adult Health

## 2016-01-03 ENCOUNTER — Encounter: Payer: Self-pay | Admitting: Adult Health

## 2016-01-03 VITALS — BP 132/80 | HR 77 | Ht 65.0 in | Wt 148.0 lb

## 2016-01-03 DIAGNOSIS — N920 Excessive and frequent menstruation with regular cycle: Secondary | ICD-10-CM

## 2016-01-03 DIAGNOSIS — N946 Dysmenorrhea, unspecified: Secondary | ICD-10-CM | POA: Diagnosis not present

## 2016-01-03 DIAGNOSIS — D259 Leiomyoma of uterus, unspecified: Secondary | ICD-10-CM

## 2016-01-03 MED ORDER — NORETHIN-ETH ESTRAD-FE BIPHAS 1 MG-10 MCG / 10 MCG PO TABS: 1.0000 | ORAL_TABLET | Freq: Every day | ORAL | Status: DC

## 2016-01-03 NOTE — Progress Notes (Signed)
Subjective:     Patient ID: Karen Burton, female   DOB: [birth year], 40 y.o.   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   Review of Systems  +heavy, painful periods  Reviewed past medical,surgical, social and family history. Reviewed medications and allergies.     Objective:   Physical Exam BP 132/80 mmHg  Pulse 77  Ht 5\' 5"  (1.651 m)  Wt 148 lb (67.132 kg)  BMI 24.63 kg/m2  LMP 12/11/2015 Reviewed [place] with pt:   Uterus 9.0 x 6.1 x 9.2 cm, numerous fibroids visualized.  Endometrium 18.4 mm, symmetrical, thickened.  Right ovary 4.1 x 1.7 x 2.9 cm, mobile.  Left ovary 2.1 x 1.6 x 1.4 cm, mobile.  No adnexal tenderness noted with TV exam.   Technologist Comments:  Pelvic/TV US today for AUB and known fibroids. Anteverted uterus with numerous fibroids. The uterus measures 9.0 x 6.1 x 9.2 cm. Endometrium is thickened at 18.4 mm. Bilateral ovaries appear normal and mobile.  The four most distinctive fibroids measure as follows: Fib #1: Right, 4.9 x 4.6 x 4.7 cm.  Fib #2: Lateral Right, 4.3 x 4.0 x 3.8 cm. Fib #3: Anterior Fundus, 4.6 x 4.3x 4.1 cm.  Fib #4: Left Posterior, 3.7 x. 3.3 x 3.2 cm.  Discussed with Dr Elonda Husky, will try low dose OCs first, but discussed myomectomy and she wants to consider that. Face time 10 minutes.  Assessment:     Fibroids Menorrhagia Dysmenorrhea     Plan:     Rx lo loestrin disp 1 pack take 1 daily with 11 refills, start with next period Return in 2 weeks to discuss myomectomy with Dr Elonda Husky Review handout on myomectomy and fibroids

## 2016-01-03 NOTE — Patient Instructions (Signed)
Start lo loestrin with next period Return in 2 weeks to talk myomectomy with Dr Elonda Husky Uterine Fibroids Uterine fibroids are tissue masses (tumors) that can develop in the womb (uterus). They are also called leiomyomas. This type of tumor is not cancerous (benign) and does not spread to other parts of the body outside of the pelvic area, which is between the hip bones. Occasionally, fibroids may develop in the fallopian tubes, in the cervix, or on the support structures (ligaments) that surround the uterus. You can have one or many fibroids. Fibroids can vary in size, weight, and where they grow in the uterus. Some can become quite large. Most fibroids do not require medical treatment. CAUSES A fibroid can develop when a single uterine cell keeps growing (replicating). Most cells in the human body have a control mechanism that keeps them from replicating without control. SIGNS AND SYMPTOMS Symptoms may include:   Heavy bleeding during your period.  Bleeding or spotting between periods.  Pelvic pain and pressure.  Bladder problems, such as needing to urinate more often (urinary frequency) or urgently.  Inability to reproduce offspring (infertility).  Miscarriages. DIAGNOSIS Uterine fibroids are diagnosed through a physical exam. Your health care provider may feel the lumpy tumors during a pelvic exam. Ultrasonography and an MRI may be done to determine the size, location, and number of fibroids. TREATMENT Treatment may include:  Watchful waiting. This involves getting the fibroid checked by your health care provider to see if it grows or shrinks. Follow your health care provider's recommendations for how often to have this checked.  Hormone medicines. These can be taken by mouth or given through an intrauterine device (IUD).  Surgery.  Removing the fibroids (myomectomy) or the uterus (hysterectomy).  Removing blood supply to the fibroids (uterine artery embolization). If fibroids  interfere with your fertility and you want to become pregnant, your health care provider may recommend having the fibroids removed.  HOME CARE INSTRUCTIONS  Keep all follow-up visits as directed by your health care provider. This is important.  Take medicines only as directed by your health care provider.  If you were prescribed a hormone treatment, take the hormone medicines exactly as directed.  Do not take aspirin, because it can cause bleeding.  Ask your health care provider about taking iron pills and increasing the amount of dark green, leafy vegetables in your diet. These actions can help to boost your blood iron levels, which may be affected by heavy menstrual bleeding.  Pay close attention to your period and tell your health care provider about any changes, such as:  Increased blood flow that requires you to use more pads or tampons than usual per month.  A change in the number of days that your period lasts per month.  A change in symptoms that are associated with your period, such as abdominal cramping or back pain. SEEK MEDICAL CARE IF:  You have pelvic pain, back pain, or abdominal cramps that cannot be controlled with medicines.  You have an increase in bleeding between and during periods.  You soak tampons or pads in a half hour or less.  You feel lightheaded, extra tired, or weak. SEEK IMMEDIATE MEDICAL CARE IF:  You faint.  You have a sudden increase in pelvic pain.   This information is not intended to replace advice given to you by your health care provider. Make sure you discuss any questions you have with your health care provider.   Document Released: 07/13/2000 Document Revised: 08/06/2014 Document  Reviewed: 01/12/2014 Elsevier Interactive Patient Education 2016 [employer] Myomectomy Myomectomy is surgery to remove a noncancerous tumor (myoma) from the uterus. Myomas are tumors made up of fibrous tissue. They are often called fibroid tumors. Fibroid  tumors can range from the size of a pea to the size of a grapefruit. In a myomectomy, the fibroid tumor is removed without removing the uterus. Because these tumors are rarely cancerous, this surgery is usually done only if the tumor is growing or causing symptoms such as pain, pressure, bleeding, or pain with intercourse. LET [facility] CARE PROVIDER KNOW ABOUT:  Any allergies you have.  All medicines you are taking, including vitamins, herbs, eye drops, creams, and over-the-counter medicines.  Previous problems you or members of your family have had with the use of anesthetics.  Any blood disorders you have.  Previous surgeries you have had.  Medical conditions you have. RISKS AND COMPLICATIONS  Generally, this is a safe procedure. However, as with any procedure, complications can occur. Possible complications include:  Excessive bleeding.  Infection.  Injury to nearby organs.  Blood clots in the legs, chest, or brain.  Scar tissue on other organs and in the pelvis. This may require another surgery to remove the scar tissue. BEFORE THE PROCEDURE  Ask your health care provider about changing or stopping your regular medicines. Avoid taking aspirin or blood thinners as directed by your health care provider.  Do not  eat or drink anything after midnight on the night before surgery.  If you smoke, do not  smoke for 2 weeks before the surgery.  Do not  drink alcohol the day before the surgery.  Arrange for someone to drive you home after the procedure or after your hospital stay. Also arrange for someone to help you with activities during your recovery. PROCEDURE You will be given medicine to make you sleep through the procedure (general anesthetic). Any of the following methods may be used to perform a myomectomy:  Small monitors will be put on your body. They are used to check your heart, blood pressure, and oxygen level.  An IV access tube will be put into one of your  veins. Medicine will be able to flow directly into your body through this IV tube.  You might be given a medicine to help you relax (sedative).  You will be given a medicine to make you sleep (general anesthetic). A breathing tube will be placed into your lungs during the procedure.  A thin, flexible tube (catheter) will be inserted into your bladder to collect urine.  Any of the following methods may be used to perform a myomectomy:  Hysteroscopic myomectomy--This method may be used when the fibroid tumor is inside the cavity of the uterus. A long, thin tube that is like a telescope (hysteroscope) is inserted inside the uterus. A saline solution is put into your uterus. This expands the uterus and allows the surgeon to see the fibroids. Tools are passed through the hysteroscope to remove the fibroid tumor in pieces.  Laparoscopic myomectomy--A few small cuts (incisions) are made in the lower abdomen. A thin, lighted tube with a tiny camera on the end (laparoscope) is inserted through one of the incisions. This gives the surgeon a good view of the area. The fibroid tumor is removed through the other incisions. The incisions are then closed with stitches (sutures) or staples.  Abdominal myomectomy--This method is used when the fibroid tumor cannot be removed with a hysteroscope or laparoscope. The surgery is  performed through a larger surgical incision in the abdomen. The fibroid tumor is removed through this incision. The incision is closed with sutures or staples. AFTER THE PROCEDURE  If you had a laparoscopic or hysteroscopic myomectomy, you may be able to go home the same day, or you may need to stay in the hospital overnight.  If you had an abdominal myomectomy, you may need to stay in the hospital for a few days.  Your IV access tube and catheter will be removed in 1-2 days.  You may be given medicine for pain or to help you sleep.  You may be given an antibiotic medicine, if needed.     This information is not intended to replace advice given to you by your health care provider. Make sure you discuss any questions you have with your health care provider.   Document Released: 05/13/2007 Document Revised: 05/06/2013 Document Reviewed: 02/25/2013 Elsevier Interactive Patient Education [employer].

## 2016-01-16 ENCOUNTER — Encounter: Payer: Self-pay | Admitting: Obstetrics & Gynecology

## 2016-01-16 ENCOUNTER — Ambulatory Visit (INDEPENDENT_AMBULATORY_CARE_PROVIDER_SITE_OTHER): Payer: BLUE CROSS/BLUE SHIELD | Admitting: Obstetrics & Gynecology

## 2016-01-16 ENCOUNTER — Other Ambulatory Visit: Payer: Self-pay | Admitting: Obstetrics & Gynecology

## 2016-01-16 VITALS — BP 110/80 | HR 68 | Wt 145.0 lb

## 2016-01-16 DIAGNOSIS — N920 Excessive and frequent menstruation with regular cycle: Secondary | ICD-10-CM

## 2016-01-16 DIAGNOSIS — N946 Dysmenorrhea, unspecified: Secondary | ICD-10-CM

## 2016-01-16 DIAGNOSIS — D259 Leiomyoma of uterus, unspecified: Secondary | ICD-10-CM

## 2016-01-16 NOTE — Progress Notes (Signed)
Patient ID: Karen Burton, female   DOB: October 16, 1975, 40 y.o.   MRN: [id number] Follow up appointment for results  Chief Complaint  Patient presents with  . discuss    Myomectomy    Blood pressure 110/80, pulse 68, weight 145 lb (65.772 kg), last menstrual period 01/05/2016.  GYNECOLOGIC SONOGRAM   Karen Burton is a 40 y.o. G0P0000 with LMP 12/11/15 for a pelvic sonogram for AUB and known fibroids.  Uterus 9.0 x 6.1 x 9.2 cm, numerous fibroids visualized.  Endometrium 18.4 mm, symmetrical, thickened.  Right ovary 4.1 x 1.7 x 2.9 cm, mobile.  Left ovary 2.1 x 1.6 x 1.4 cm, mobile.  No adnexal tenderness noted with TV exam.   Technologist Comments:  Pelvic/TV US today for AUB and known fibroids. Anteverted uterus with numerous fibroids. The uterus measures 9.0 x 6.1 x 9.2 cm. Endometrium is thickened at 18.4 mm. Bilateral ovaries appear normal and mobile.  The four most distinctive fibroids measure as follows: Fib #1: Right, 4.9 x 4.6 x 4.7 cm.  Fib #2: Lateral Right, 4.3 x 4.0 x 3.8 cm. Fib #3: Anterior Fundus, 4.6 x 4.3x 4.1 cm.  Fib #4: Left Posterior, 3.7 x. 3.3 x 3.2 cm.     Georga Bora 12/30/2015 11:46 AM Clinical Impression and recommendations:  I have reviewed the sonogram results above. There has been progression in total uterine size as compared to ultrasound from 2012, with current uterine weight estimated at 250-300 g, 12-14 week size  Combined with the patient's current clinical course, below are my impressions and any appropriate recommendations for management based on the sonographic findings:  Patient will be seen in follow-up by Dr. Elonda Husky, and she wishes to consider myomectomy  FERGUSON,JOHN V     MEDS ordered this encounter: No orders of the defined types were placed in this encounter.    Orders for this encounter: No orders of the defined types were placed in this  encounter.    Plan: refeferred for consult for a uterine artery embolization for relatively small myltiple myomata as opposed to having a myomectomy  Consult scheduled with pt present  Follow Up:     Face to face time:  15 minutes  Greater than 50% of the visit time was spent in counseling and coordination of care with the patient.  The summary and outline of the counseling and care coordination is summarized in the note above.   All questions were answered.  Past Medical History  Diagnosis Date  . Vitamin D deficiency   . Eczema     mild  . Reflux   . Rhinitis     chonic  . Rash   . Arthritis   . Fibromyalgia   . Anemia   . Menorrhagia with regular cycle 12/20/2015  . Dysmenorrhea 12/20/2015  . Fibroids 12/20/2015  . Vulvar itching 12/20/2015    Past Surgical History  Procedure Laterality Date  . Esophagogastroduodenoscopy endoscopy      OB History    Gravida Para Term Preterm AB TAB SAB Ectopic Multiple Living   0 0 0 0 0 0 0 0 0 0       Allergies  Allergen Reactions  . Ceftin [Cefuroxime Axetil]     Itchy Rash on neck, no hives    Social History   Social History  . Marital Status: Single    Spouse Name: N/A  . Number of Children: N/A  . Years of Education: N/A   Social History Main Topics  .  Smoking status: Never Smoker   . Smokeless tobacco: Never Used  . Alcohol Use: No  . Drug Use: No  . Sexual Activity: Not Currently    Birth Control/ Protection: None   Other Topics Concern  . None   Social History Narrative    Family History  Problem Relation Age of Onset  . Diabetes Mother   . Hypertension Mother   . Asthma Mother   . Cancer Mother     breast  . Thyroid disease Mother   . Arthritis Mother   . Graves' disease Mother   . Cancer Maternal Grandmother     pancreatic

## 2016-01-23 ENCOUNTER — Ambulatory Visit: Payer: BLUE CROSS/BLUE SHIELD | Admitting: Family Medicine

## 2016-01-24 ENCOUNTER — Telehealth: Payer: Self-pay | Admitting: Family Medicine

## 2016-01-24 ENCOUNTER — Ambulatory Visit: Payer: BLUE CROSS/BLUE SHIELD | Admitting: Family Medicine

## 2016-01-24 NOTE — Telephone Encounter (Signed)
Ov tom hgb and ov

## 2016-01-24 NOTE — Telephone Encounter (Signed)
Spoke with patient and informed her per Dr.Steve Luking- Office visit tomorrow for hemoglobin. Patient verbalized understanding.

## 2016-01-24 NOTE — Telephone Encounter (Signed)
Pt is having issue with her teeth, needs to see an oral surgeon for extraction But he is telling her that her hemoglobin at a 9 is unacceptable and won't  Do this until she has a further testing. She is on the books here for  Friday for this but they want her do her tooth extraction Thursday morning. Can we get her in sometime tomorrow for the hemoglobin? She is having to take Ibuprofen like candy for the past few days from the pain that the exposed root is  Causing her.   Please advise

## 2016-01-25 ENCOUNTER — Ambulatory Visit (INDEPENDENT_AMBULATORY_CARE_PROVIDER_SITE_OTHER): Payer: BLUE CROSS/BLUE SHIELD | Admitting: Family Medicine

## 2016-01-25 ENCOUNTER — Encounter: Payer: Self-pay | Admitting: Family Medicine

## 2016-01-25 VITALS — BP 122/74 | Ht 66.0 in | Wt 146.0 lb

## 2016-01-25 DIAGNOSIS — D649 Anemia, unspecified: Secondary | ICD-10-CM

## 2016-01-25 LAB — POCT HEMOGLOBIN: HEMOGLOBIN: 11.5 g/dL — AB (ref 12.2–16.2)

## 2016-01-25 NOTE — Progress Notes (Signed)
   Subjective:    Patient ID: Karen Burton, female    DOB: 18-May-1976, 40 y.o.   MRN: [id number]  HPIFollow up on anemia. Takes iron bid. Hb today 11.5.   Pt needs copy of hemoglobin to take to dentist to have tooth extracted. Results added to after visit summary.   Pt having rough time with tooth, filling fell out, and still has sig residual pain and swelling    Review of Systems No headache, no major weight loss or weight gain, no chest pain no back pain abdominal pain no change in bowel habits complete ROS otherwise negative Overall more energy. Some symptoms with iron tablets.    Objective:   Physical Exam  Alert vital stable lungs clear. Heart rare rhythm blood pressure good H&T normal      Assessment & Plan:  Impression anemia much improved discussed. Some GI sensitivity plan decrease iron tablets one daily. Continue following up with OB/GYN for interventions for menorrhagia WSL

## 2016-01-25 NOTE — Patient Instructions (Signed)
Results for orders placed or performed in visit on 01/25/16  POCT hemoglobin  Result Value Ref Range   Hemoglobin 11.5 (A) 12.2 - 16.2 g/dL

## 2016-01-27 ENCOUNTER — Ambulatory Visit: Payer: BLUE CROSS/BLUE SHIELD | Admitting: Nurse Practitioner

## 2016-02-07 ENCOUNTER — Ambulatory Visit
Admission: RE | Admit: 2016-02-07 | Discharge: 2016-02-07 | Disposition: A | Discharge status: Home / Self Care | Payer: BLUE CROSS/BLUE SHIELD | Source: Ambulatory Visit | Attending: Obstetrics & Gynecology | Admitting: Obstetrics & Gynecology

## 2016-02-07 ENCOUNTER — Other Ambulatory Visit (HOSPITAL_COMMUNITY): Payer: Self-pay | Admitting: Interventional Radiology

## 2016-02-07 ENCOUNTER — Other Ambulatory Visit: Payer: Self-pay | Admitting: Radiology

## 2016-02-07 DIAGNOSIS — D259 Leiomyoma of uterus, unspecified: Secondary | ICD-10-CM

## 2016-02-07 DIAGNOSIS — N920 Excessive and frequent menstruation with regular cycle: Secondary | ICD-10-CM

## 2016-02-07 HISTORY — PX: IR GENERIC HISTORICAL: IMG1180011

## 2016-02-07 NOTE — Consult Note (Signed)
Chief Complaint: Systematic uterine fibroids, dysfunctional uterine bleeding.   Referring Physician(s): Eure,Luther H  History of Present Illness: Karen Burton is a 40 y.o. female with symptomatic uterine fibroids resulting in dysfunctional uterine bleeding. Review of her menstrual cycle performed. No current menopausal symptoms. Last menstrual cycle 01/26/2016. Menstrual cycle is proximally once a month. Cycle length is 7 days with 5 heavy days with passage of fresh blood clots. This requires frequent pad change every 2 hours. No interperiod bleeding. In addition to menorrhagia, she has bulk related symptoms with urinary frequency, abdominal pain and pressure, and back pain. Recent ultrasound confirms at least 4 uterine fibroids, largest measuring 5 cm. Uterine size approximately 12-14 weeks. She has been started on birth control pills by her OB/GYN. No recent fibroid surgery. No recent GYN infection. Recent Pap smear 12/20/2015 was negative.  Past Medical History  Diagnosis Date  . Vitamin D deficiency   . Eczema     mild  . Reflux   . Rhinitis     chonic  . Rash   . Arthritis   . Fibromyalgia   . Anemia   . Menorrhagia with regular cycle 12/20/2015  . Dysmenorrhea 12/20/2015  . Fibroids 12/20/2015  . Vulvar itching 12/20/2015    Past Surgical History  Procedure Laterality Date  . Esophagogastroduodenoscopy endoscopy      Allergies: Ceftin  Medications: Prior to Admission medications   Medication Sig Start Date End Date Taking? Authorizing Provider  acetaminophen (TYLENOL) 500 MG tablet Take 1,000 mg by mouth as needed.   Yes Historical Provider, MD  amLODipine (NORVASC) 5 MG tablet TAKE 1 TABLET BY MOUTH DAILY FOR BLOOD PRESSURE. 09/27/15  Yes Mikey Kirschner, MD  docusate sodium (COLACE) 100 MG capsule Take 100 mg by mouth 2 (two) times daily.   Yes Historical Provider, MD  DULoxetine (CYMBALTA) 60 MG capsule Take 60 mg by mouth daily.   Yes Historical  Provider, MD  ferrous sulfate 325 (65 FE) MG tablet Take 325 mg by mouth 2 (two) times daily with a meal.   Yes Historical Provider, MD  Golimumab ([place]) Inject into the skin. Takes once monthly.   Yes Historical Provider, MD  hydrochlorothiazide (MICROZIDE) 12.5 MG capsule TAKE ONE CAPSULE BY MOUTH EVERY MORNING. 09/27/15  Yes Mikey Kirschner, MD  ibuprofen (ADVIL,MOTRIN) 200 MG tablet Take 200 mg by mouth as needed.   Yes Historical Provider, MD  Multiple Vitamins-Minerals (MULTIVITAMIN WITH MINERALS) tablet Take 1 tablet by mouth daily.   Yes Historical Provider, MD  Norethindrone-Ethinyl Estradiol-Fe Biphas (LO LOESTRIN FE) 1 MG-10 MCG / 10 MCG tablet Take 1 tablet by mouth daily. Take 1 daily by mouth 01/03/16  Yes Estill Dooms, NP  nystatin-triamcinolone (MYCOLOG II) cream Apply 1 application topically 2 (two) times daily. 12/20/15  Yes Estill Dooms, NP  triamcinolone cream (KENALOG) 0.1 % Apply 1 application topically as needed.   Yes Historical Provider, MD  amoxicillin (AMOXIL) 500 MG capsule Reported on 02/07/2016 01/23/16   Historical Provider, MD     Family History  Problem Relation Age of Onset  . Diabetes Mother   . Hypertension Mother   . Asthma Mother   . Cancer Mother     breast  . Thyroid disease Mother   . Arthritis Mother   . Graves' disease Mother   . Cancer Maternal Grandmother     pancreatic    Social History   Social History  . Marital  Status: Single    Spouse Name: N/A  . Number of Children: N/A  . Years of Education: N/A   Social History Main Topics  . Smoking status: Never Smoker   . Smokeless tobacco: Never Used  . Alcohol Use: No  . Drug Use: No  . Sexual Activity: Not Currently    Birth Control/ Protection: None   Other Topics Concern  . Not on file   Social History Narrative     Review of Systems: A 12 point ROS discussed and pertinent positives are indicated in the HPI above.  All other systems are negative.  Review of  Systems  Vital Signs: BP 126/82 mmHg  Pulse 89  Temp(Src) 97.9 F (36.6 C) (Oral)  Resp 14  Ht 5\' 5"  (1.651 m)  Wt 146 lb (66.225 kg)  BMI 24.30 kg/m2  SpO2 99%  LMP 01/26/2016 (Exact Date)  Physical Exam  Constitutional: She is oriented to person, place, and time. She appears well-developed and well-nourished. No distress.  Cardiovascular: Normal rate, regular rhythm and intact distal pulses.   No murmur heard. Pulmonary/Chest: Effort normal and breath sounds normal. No respiratory distress.  Abdominal: Soft. Bowel sounds are normal. She exhibits no distension and no mass. There is no tenderness. No hernia.  Fibroid uterus is not palpable on abdominal exam.  Neurological: She is alert and oriented to person, place, and time.  Skin: Skin is warm and dry. She is not diaphoretic. No erythema. No pallor.  Psychiatric: She has a normal mood and affect. Her behavior is normal.    Mallampati Score:   2  Imaging: No results found.  Labs:  CBC:  Recent Labs  01/25/16 1421  HGB 11.5*     Assessment and Plan:  Systematic uterine fibroids resulting in menorrhagia, urinary frequency, abdominal and pelvic pain and pressure. Fibroid uterus has enlarged by ultrasound compared to 2012. Treatment options were reviewed today. Specifically, our discussion centered around uterine fibroid embolization. The procedure, risks, benefits and alternatives were reviewed as well as the expected goals and outcomes. All questions were addressed. She has a clear understanding of the procedure. She would like to proceed with obtaining a pelvic MRI without and with contrast to determine if her fibroid anatomy is amenable to embolization therapy. This will be scheduled in the next few days.  Plan: Schedule for pelvic MRI without and with contrast to assess fibroid anatomy.  Thank you for this interesting consult.  I greatly enjoyed meeting Karen Burton and look forward to participating in their  care.  A copy of this report was sent to the requesting provider on this date.  Electronically Signed: Greggory Keen 02/07/2016, 10:56 AM   I spent a total of  30 Minutes   in face to face in clinical consultation, greater than 50% of which was counseling/coordinating care for this patient with systematic uterine fibroids.

## 2016-02-09 LAB — CREATININE WITH EST GFR
Creat: 0.82 mg/dL (ref 0.50–1.10)
GFR, Est African American: >89 mL/min (ref >=60)

## 2016-02-09 LAB — BUN: BUN: 9 mg/dL (ref 7–25)

## 2016-02-17 ENCOUNTER — Ambulatory Visit (HOSPITAL_COMMUNITY)
Admission: RE | Admit: 2016-02-17 | Discharge: 2016-02-17 | Disposition: A | Discharge status: Home / Self Care | Payer: BLUE CROSS/BLUE SHIELD | Source: Ambulatory Visit | Attending: Interventional Radiology | Admitting: Interventional Radiology

## 2016-02-17 DIAGNOSIS — D259 Leiomyoma of uterus, unspecified: Secondary | ICD-10-CM | POA: Insufficient documentation

## 2016-02-17 DIAGNOSIS — N920 Excessive and frequent menstruation with regular cycle: Secondary | ICD-10-CM

## 2016-02-17 MED ORDER — GADOBENATE DIMEGLUMINE 529 MG/ML IV SOLN
15.0000 mL | Freq: Once | INTRAVENOUS | Status: AC | PRN
  Administered 2016-02-17: 13 mL via INTRAVENOUS

## 2016-02-27 ENCOUNTER — Other Ambulatory Visit (HOSPITAL_COMMUNITY): Payer: Self-pay | Admitting: Interventional Radiology

## 2016-02-27 DIAGNOSIS — D259 Leiomyoma of uterus, unspecified: Secondary | ICD-10-CM

## 2016-03-11 ENCOUNTER — Other Ambulatory Visit: Payer: Self-pay | Admitting: Family Medicine

## 2016-03-13 ENCOUNTER — Other Ambulatory Visit: Payer: Self-pay | Admitting: Interventional Radiology

## 2016-03-13 DIAGNOSIS — D250 Submucous leiomyoma of uterus: Secondary | ICD-10-CM

## 2016-04-13 ENCOUNTER — Encounter: Payer: Self-pay | Admitting: Family Medicine

## 2016-04-13 ENCOUNTER — Ambulatory Visit (INDEPENDENT_AMBULATORY_CARE_PROVIDER_SITE_OTHER): Payer: BLUE CROSS/BLUE SHIELD | Admitting: Family Medicine

## 2016-04-13 VITALS — BP 130/82 | Temp 98.4°F | Ht 66.0 in | Wt 148.4 lb

## 2016-04-13 DIAGNOSIS — J019 Acute sinusitis, unspecified: Secondary | ICD-10-CM

## 2016-04-13 DIAGNOSIS — J029 Acute pharyngitis, unspecified: Secondary | ICD-10-CM

## 2016-04-13 DIAGNOSIS — B9689 Other specified bacterial agents as the cause of diseases classified elsewhere: Secondary | ICD-10-CM

## 2016-04-13 LAB — POCT RAPID STREP A (OFFICE): RAPID STREP A SCREEN: NEGATIVE

## 2016-04-13 MED ORDER — AMOXICILLIN-POT CLAVULANATE 875-125 MG PO TABS: 1.0000 | ORAL_TABLET | Freq: Two times a day (BID) | ORAL | 0 refills | Status: AC

## 2016-04-13 NOTE — Progress Notes (Signed)
   Subjective:    Patient ID: Karen Burton, female    DOB: [birth year], 40 y.o.   MRN: [id number]  Sore Throat   This is a new problem. The current episode started in the past 7 days. Associated symptoms include headaches.   Frontal h a  Took two aleaves helped a little, shelped some  Pos prodctn of gunky and stuff and dranage  Burning also in the throat  Took benadryl and claritin  irrit in the throat  No sig cough   Results for orders placed or performed in visit on 04/13/16  POCT rapid strep A  Result Value Ref Range   Rapid Strep A Screen Negative Negative      Review of Systems  Neurological: Positive for headaches.       Objective:   Physical Exam  Alert, mild malaise. Hydration good Vitals stable. frontal/ maxillary tenderness evident positive nasal congestion. pharynx normal neck supple  lungs clear/no crackles or wheezes. heart regular in rhythm       Assessment & Plan:  Impression rhinosinusitis likely post viral, discussed with patient. plan antibiotics prescribed. Questions answered. Symptomatic care discussed. warning signs discussed. WSL

## 2016-04-23 ENCOUNTER — Other Ambulatory Visit: Payer: Self-pay | Admitting: Family Medicine

## 2016-04-23 MED ORDER — HYDROCHLOROTHIAZIDE 12.5 MG PO CAPS: 12.5000 mg | ORAL_CAPSULE | Freq: Every morning | ORAL | 0 refills | Status: DC

## 2016-04-23 MED ORDER — AMLODIPINE BESYLATE 5 MG PO TABS: 5.0000 mg | ORAL_TABLET | Freq: Every day | ORAL | 0 refills | Status: DC

## 2016-04-23 NOTE — Telephone Encounter (Signed)
Prescriptions sent electronically to pharmacy. Patient notified. °

## 2016-04-23 NOTE — Telephone Encounter (Signed)
Patient requesting refill on amlodipine 5mg  and hydrochlorothiazide 12.5 mg also most out of medication.

## 2016-04-27 ENCOUNTER — Encounter: Payer: Self-pay | Admitting: Interventional Radiology

## 2016-06-13 ENCOUNTER — Other Ambulatory Visit: Payer: Self-pay | Admitting: Family Medicine

## 2016-06-15 ENCOUNTER — Encounter: Payer: Self-pay | Admitting: Rheumatology

## 2016-06-15 ENCOUNTER — Ambulatory Visit (INDEPENDENT_AMBULATORY_CARE_PROVIDER_SITE_OTHER): Payer: BLUE CROSS/BLUE SHIELD | Admitting: Rheumatology

## 2016-06-15 VITALS — BP 123/80 | HR 80 | Resp 12 | Ht 65.0 in | Wt 155.0 lb

## 2016-06-15 DIAGNOSIS — Z79899 Other long term (current) drug therapy: Secondary | ICD-10-CM | POA: Diagnosis not present

## 2016-06-15 DIAGNOSIS — M62838 Other muscle spasm: Secondary | ICD-10-CM | POA: Diagnosis not present

## 2016-06-15 DIAGNOSIS — M797 Fibromyalgia: Secondary | ICD-10-CM

## 2016-06-15 DIAGNOSIS — G4700 Insomnia, unspecified: Secondary | ICD-10-CM | POA: Diagnosis not present

## 2016-06-15 DIAGNOSIS — M0579 Rheumatoid arthritis with rheumatoid factor of multiple sites without organ or systems involvement: Secondary | ICD-10-CM | POA: Diagnosis not present

## 2016-06-15 LAB — COMPLETE METABOLIC PANEL WITH GFR
ALT: 18 U/L (ref 6–29)
AST: 22 U/L (ref 10–30)
Albumin: 3.7 g/dL (ref 3.6–5.1)
Alkaline Phosphatase: 50 U/L (ref 33–115)
BUN: 10 mg/dL (ref 7–25)
CALCIUM: 9.1 mg/dL (ref 8.6–10.2)
CHLORIDE: 107 mmol/L (ref 98–110)
CO2: 28 mmol/L (ref 20–31)
Creat: 0.68 mg/dL (ref 0.50–1.10)
GFR, Est Non African American: >89 mL/min (ref >=60)
Glucose, Bld: 79 mg/dL (ref 65–99)
POTASSIUM: 4.1 mmol/L (ref 3.5–5.3)
Sodium: 141 mmol/L (ref 135–146)
Total Bilirubin: 0.3 mg/dL (ref 0.2–1.2)
Total Protein: 6.6 g/dL (ref 6.1–8.1)

## 2016-06-15 LAB — CBC WITH DIFFERENTIAL/PLATELET
Basophils Absolute: 39 cells/uL (ref 0–200)
Basophils Relative: 1 %
EOS ABS: 78 {cells}/uL (ref 15–500)
Eosinophils Relative: 2 %
HEMATOCRIT: 39.8 % (ref 35.0–45.0)
Hemoglobin: 12.7 g/dL (ref 11.7–15.5)
LYMPHS PCT: 29 %
Lymphs Abs: 1131 cells/uL (ref 850–3900)
MCH: 29.2 pg (ref 27.0–33.0)
MCHC: 31.9 g/dL — AB (ref 32.0–36.0)
MCV: 91.5 fL (ref 80.0–100.0)
MONO ABS: 351 {cells}/uL (ref 200–950)
MPV: 10 fL (ref 7.5–12.5)
Monocytes Relative: 9 %
NEUTROS PCT: 59 %
Neutro Abs: 2301 cells/uL (ref 1500–7800)
Platelets: 277 10*3/uL (ref 140–400)
RBC: 4.35 MIL/uL (ref 3.80–5.10)
RDW: 14.5 % (ref 11.0–15.0)
WBC: 3.9 10*3/uL (ref 3.8–10.8)

## 2016-06-15 MED ORDER — METHOCARBAMOL 500 MG PO TABS: 500.0000 mg | ORAL_TABLET | Freq: Three times a day (TID) | ORAL | 2 refills | Status: AC

## 2016-06-15 NOTE — Progress Notes (Signed)
Rheumatology Medication Review by a Pharmacist Does the patient feel that his/her medications are working for him/her?  Yes Has the patient been experiencing any side effects to the medications prescribed?  No Does the patient have any problems obtaining medications?  No  Issues to address at subsequent visits: None   Pharmacist comments:  Karen Burton is a pleasant 40yo F who presents to clinic for follow up of her Rheumatoid Arthritis.  She is currently taking Simponi 50 mg once a month.  She is due for her standing labs (CBC, CMP) today, and will be due for her TB Gold test again in January 2018.  Lab orders placed today.  Patient did not have any medication related questions at this time.    Karen Burton, Pharm.D., BCPS Clinical Pharmacist Pager: [phone] Phone: [phone] 06/15/2016 10:06 AM

## 2016-06-15 NOTE — Patient Instructions (Signed)
Standing Labs We placed an order today for your standing lab work.    Please come back and get your standing labs in February 2018 and every 3 months.  You will be due for your TB Gold test in January 2018.    We have open lab Monday through Friday from 8:30-11:30 AM and 1-4 PM at the office of Dr. Tresa Moore, PA.   The office is located at [address] [place], [place], [place] [address] No appointment is necessary.   Labs are drawn by [facility].  You may receive a bill from [facility] for your lab work.

## 2016-06-15 NOTE — Progress Notes (Signed)
Office Visit Note  Patient: Karen Burton             Date of Birth: 08/13/1975           MRN: [id number]             PCP: Mickie Hillier, MD Referring: Mikey Kirschner, MD Visit Date: 06/15/2016 Occupation: @GUAROCC @    Subjective:  No chief complaint on file. Follow-up on rheumatoid arthritis, high risk prescription (Simponi subcutaneous), fatigue and fibromyalgia.  History of Present Illness: Karen Burton is a 40 y.o. female  Last seen in our office on 12/16/2015. Patient is doing well with her rheumatoid arthritis on that visit as she is on this visit. There is no synovitis on examination Uses simponi without any problems except she does have long-standing anemia.   Patient's fibromyalgia is about 5 on a scale of 0-10. When she has a flare goes high as 8.  She has a lot of pain to the trapezius muscle area but we have decided not to do any cortisone injection at this time. She will try Voltaren gel first. She also has agreed to try a muscle relaxer to see if that will help her. 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 We discussed this at length and she knows to avoid it if it causes her drowsiness and she will call us and let us know and we can switch her to baclofen.   Activities of Daily Living:  Patient reports morning stiffness for 15 minutes.   Patient Denies nocturnal pain.  Difficulty dressing/grooming: Denies Difficulty climbing stairs: Denies Difficulty getting out of chair: Denies Difficulty using hands for taps, buttons, cutlery, and/or writing: Denies   Review of Systems  Constitutional: Positive for fatigue.  HENT: Negative for mouth sores and mouth dryness.   Eyes: Negative for dryness.  Respiratory: Negative for shortness of breath.   Gastrointestinal: Negative for constipation and diarrhea.  Musculoskeletal: Positive for myalgias and myalgias.  Skin: Negative for  sensitivity to sunlight.  Psychiatric/Behavioral: Positive for sleep disturbance. Negative for decreased concentration.    PMFS History:  Patient Active Problem List   Diagnosis Date Noted  . Menorrhagia with regular cycle 12/20/2015  . Dysmenorrhea 12/20/2015  . Fibroids 12/20/2015  . Vulvar itching 12/20/2015  . Anemia 12/20/2015  . Essential hypertension, benign 01/12/2013  . Esophageal reflux 01/12/2013  . Rheumatoid arthritis ([place]) 01/12/2013  . Rash and nonspecific skin eruption 01/12/2013    Past Medical History:  Diagnosis Date  . Anemia   . Arthritis   . Dysmenorrhea 12/20/2015  . Eczema    mild  . Fibroids 12/20/2015  . Fibromyalgia   . Menorrhagia with regular cycle 12/20/2015  . Rash   . Reflux   . Rhinitis    chonic  . Vitamin D deficiency   . Vulvar itching 12/20/2015    Family History  Problem Relation Age of Onset  . Diabetes Mother   . Hypertension Mother   . Asthma Mother   . Cancer Mother     breast  . Thyroid disease Mother   . Arthritis Mother   . Graves' disease Mother   . Cancer Maternal Grandmother     pancreatic   Past Surgical History:  Procedure Laterality Date  . ESOPHAGOGASTRODUODENOSCOPY ENDOSCOPY    . IR GENERIC HISTORICAL  02/07/2016   IR RADIOLOGIST EVAL & MGMT 02/07/2016 [name], MD [facility]  INTERV RAD  . WISDOM TOOTH EXTRACTION     Social History   Social History Narrative  . No narrative on file     Objective: Vital Signs: BP 123/80 (BP Location: Left Arm, Patient Position: Sitting, Cuff Size: Large)   Pulse 80   Resp 12   Ht 5\' 5"  (1.651 m)   Wt 155 lb (70.3 kg)   LMP 06/06/2016   BMI 25.79 kg/m    Physical Exam  Constitutional: She is oriented to person, place, and time. She appears well-developed and well-nourished.  HENT:  Head: Normocephalic and atraumatic.  Eyes: EOM are normal. Pupils are equal, round, and reactive to light.  Cardiovascular: Normal rate, regular rhythm and normal heart sounds.  Exam  reveals no gallop and no friction rub.   No murmur heard. Pulmonary/Chest: Effort normal and breath sounds normal. She has no wheezes. She has no rales.  Abdominal: Soft. Bowel sounds are normal. She exhibits no distension. There is no tenderness. There is no guarding. No hernia.  Musculoskeletal: Normal range of motion. She exhibits no edema, tenderness or deformity.  Lymphadenopathy:    She has no cervical adenopathy.  Neurological: She is alert and oriented to person, place, and time. Coordination normal.  Skin: Skin is warm and dry. Capillary refill takes less than 2 seconds. No rash noted.  Psychiatric: She has a normal mood and affect. Her behavior is normal.  Nursing note and vitals reviewed.    Musculoskeletal Exam:  Full range of motion of all joints Grip strength is equal and strong bilaterally Fiber myalgia tender points are 18 out of 18 positive.  CDAI Exam: CDAI Homunculus Exam:   Joint Counts:  CDAI Tender Joint count: 0 CDAI Swollen Joint count: 0  Global Assessments:  Patient Global Assessment: 5 Provider Global Assessment: 5  CDAI Calculated Score: 10    Investigation: No additional findings.   Imaging: No results found.  Speciality Comments: No specialty comments available.    Procedures:  No procedures performed Allergies: Ceftin [cefuroxime axetil]; Humira [adalimumab]; and Methotrexate derivatives   Assessment / Plan:     Visit Diagnoses: High risk medication use - Plan: CBC with Differential/Platelet, COMPLETE METABOLIC PANEL WITH GFR, CBC with Differential/Platelet, COMPLETE METABOLIC PANEL WITH GFR, Quantiferon tb gold assay (blood), Comprehensive metabolic panel, CBC with Differential/Platelet, CANCELED: Quantiferon tb gold assay (blood)  Rheumatoid arthritis with rheumatoid factor of multiple sites without organ or systems involvement (HCC)  Fibromyalgia  Insomnia, unspecified type   We have reviewed the patient's CBC with  differential CMP with GFR. The 03/07/2016 labs show CMP with GFR normal CBC with differential normal. She will get blood drawn today.  She will need CBC with differential CMP with GFR every 3 months starting February 2018.  I called in Robaxin 500 mg 1 by mouth 3 times a day when necessary dispense 30 days supply with 2 refills  She does not need a biologic refill at this time. She will call us when she does.  I've ordered TB gold today. 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  Orders: Orders Placed This Encounter  Procedures  . CBC with Differential/Platelet  . COMPLETE METABOLIC PANEL WITH GFR  . CBC with Differential/Platelet  . COMPLETE METABOLIC PANEL WITH GFR  . Quantiferon tb gold assay (blood)  . Comprehensive metabolic panel  . CBC with Differential/Platelet   Meds ordered this  encounter  Medications  . methocarbamol (ROBAXIN) 500 MG tablet    Sig: Take 1 tablet (500 mg total) by mouth 3 (three) times daily. Don't take if it makes you drowsy. Use as needed for muscle spasms.    Dispense:  90 tablet    Refill:  2    Order Specific Question:   Supervising Provider    Answer:   Bo Merino [phone]    Face-to-face time spent with patient was 30 minutes. 50% of time was spent in counseling and coordination of care.  Follow-Up Instructions: Return in about 5 months (around 11/13/2016) for RA, [place], [place].   Eliezer Lofts, PA-C   I examined and evaluated the patient with Eliezer Lofts PA. The plan of care was discussed as noted above.  Bo Merino, MD

## 2016-06-18 LAB — QUANTIFERON TB GOLD ASSAY (BLOOD)
INTERFERON GAMMA RELEASE ASSAY: NEGATIVE
Mitogen-Nil: 6.56 IU/mL
QUANTIFERON NIL VALUE: 0.02 [IU]/mL
Quantiferon Tb Ag Minus Nil Value: 0 IU/mL

## 2016-06-18 NOTE — Progress Notes (Signed)
#  1: TB gold is negative  #2: CBC with differential and CMP with GFR within normal limits.  #3: No change in treatment#4 please tell patient lab results and send copy to her PCP#5 thank you

## 2016-06-25 ENCOUNTER — Telehealth: Payer: Self-pay | Admitting: Pharmacist

## 2016-06-25 NOTE — Telephone Encounter (Signed)
Received a fax from patient's insurance regarding medication underuse of Simponi injections.  The last fill of the medication was 04/30/16.  "This patient may be taking their biologic medication differently than we expected based on our pharmacy claims record.  There could be many reasons for this.  If you have given this patient new instructions for use, please consider providing a new prescription to reflect this update.  You may have discontinued the medicine or changed the patient to an alternative.  If you haven't made a change to this patient's biologic therapy, your patient may be having difficulty due to side effects, cost, schedules or other reasons.  Please talk to your patient about the importance of adherence to their medicines and ways you can help them address individual barriers to adherence."    Patient had appointment on 06/15/16 and reported adherence with her medication.  She denied any medication related questions or concerns at that time.  I called patient to discuss.  There was no answer.  Left message for patient to call me back.    [name], Pharm.D., BCPS Clinical Pharmacist Pager: [phone] Phone: [phone] 06/25/2016 3:09 PM

## 2016-06-28 NOTE — Telephone Encounter (Signed)
Called patient regarding the letter from her insurance company.  She reports adherence with her monthly Simponi injections.  She denies any questions or concerns regarding the medication and confirms she had her prescription refilled this month.    [name], Pharm.D., BCPS Clinical Pharmacist Pager: [phone] Phone: [phone] 06/28/2016 10:45 AM

## 2016-07-12 ENCOUNTER — Telehealth: Payer: Self-pay | Admitting: Family Medicine

## 2016-07-12 MED ORDER — AMLODIPINE BESYLATE 5 MG PO TABS: 5.0000 mg | ORAL_TABLET | Freq: Every day | ORAL | 0 refills | Status: DC

## 2016-07-12 MED ORDER — HYDROCHLOROTHIAZIDE 12.5 MG PO CAPS: 12.5000 mg | ORAL_CAPSULE | Freq: Every morning | ORAL | 0 refills | Status: DC

## 2016-07-12 NOTE — Telephone Encounter (Signed)
Requesting 1 month refill on hydrochlorothiazide (MICROZIDE) 12.5 MG capsule and amLODipine (NORVASC) 5 MG tablet.  She has a follow up appointment with Dr. Richardson Landry on 08/03/16.  [employer]

## 2016-07-12 NOTE — Telephone Encounter (Signed)
Left message notifying patient refill sent to pharmacy.

## 2016-08-03 ENCOUNTER — Encounter: Payer: Self-pay | Admitting: Family Medicine

## 2016-08-03 ENCOUNTER — Ambulatory Visit (INDEPENDENT_AMBULATORY_CARE_PROVIDER_SITE_OTHER): Payer: BLUE CROSS/BLUE SHIELD | Admitting: Family Medicine

## 2016-08-03 VITALS — BP 134/80 | Ht 65.0 in | Wt 154.0 lb

## 2016-08-03 DIAGNOSIS — I10 Essential (primary) hypertension: Secondary | ICD-10-CM

## 2016-08-03 MED ORDER — HYDROCHLOROTHIAZIDE 12.5 MG PO CAPS: 12.5000 mg | ORAL_CAPSULE | Freq: Every morning | ORAL | 5 refills | Status: DC

## 2016-08-03 MED ORDER — AMLODIPINE BESYLATE 5 MG PO TABS: 5.0000 mg | ORAL_TABLET | Freq: Every day | ORAL | 5 refills | Status: DC

## 2016-08-03 NOTE — Progress Notes (Signed)
   Subjective:    Patient ID: Karen Burton, female    DOB: 03-21-1976, 41 y.o.   MRN: [id number]  Hypertension  This is a chronic problem. The current episode started more than 1 year ago. Compliance problems include exercise and diet.    Pt states no concerns.   Compliant with b p meds  Was walkin ,  Diet is good, watches salt intake closely, eat s a  Lot of microwave dinners,watching salt more closely  Blood pressure medicine and blood pressure levels reviewed today with patient. Compliant with blood pressure medicine. States does not miss a dose. No obvious side effects. Blood pressure generally good when checked elsewhere. Watching salt intake.  bp meds meed to improve diet and exr, but vey compliant   Review of Systems No headache, no major weight loss or weight gain, no chest pain no back pain abdominal pain no change in bowel habits complete ROS otherwise negative     Objective:   Physical Exam  Alert vitals stable, NAD. Blood pressure good on repeat. HEENT normal. Lungs clear. Heart regular rate and rhythm.       Assessment & Plan:  Impression hypertension good control discussed maintain same meds plan diet exercise discussed. Medications refilled. Recheck in 6 months WSL

## 2016-08-13 ENCOUNTER — Encounter: Payer: Self-pay | Admitting: Family Medicine

## 2016-08-13 ENCOUNTER — Ambulatory Visit (INDEPENDENT_AMBULATORY_CARE_PROVIDER_SITE_OTHER): Payer: BLUE CROSS/BLUE SHIELD | Admitting: Family Medicine

## 2016-08-13 VITALS — BP 132/88 | Temp 98.9°F | Ht 65.0 in | Wt 158.0 lb

## 2016-08-13 DIAGNOSIS — J329 Chronic sinusitis, unspecified: Secondary | ICD-10-CM | POA: Diagnosis not present

## 2016-08-13 DIAGNOSIS — J310 Chronic rhinitis: Secondary | ICD-10-CM

## 2016-08-13 MED ORDER — AZITHROMYCIN 250 MG PO TABS: ORAL_TABLET | ORAL | 0 refills | Status: DC

## 2016-08-13 NOTE — Progress Notes (Signed)
   Subjective:    Patient ID: Karen Burton, female    DOB: 01-Apr-1976, 41 y.o.   MRN: [id number]  Sinusitis  This is a new problem. Episode onset: 3 days. Associated symptoms include congestion, ear pain, headaches and a sore throat. (Nausea) Treatments tried: coldeeze, coricidin.   fri bad headache out of the blue  Next day bad throat pain   Bad thru out the weekend  Severe pain.   Ears painful Upper part of back and chrst   Chills and achey  Pt got flu shot  Little coughin gnot a lot  Today severe this morn and seere thrat pain    Review of Systems  HENT: Positive for congestion, ear pain and sore throat.   Neurological: Positive for headaches.       Objective:   Physical Exam Alert, mild malaise. Hydration good Vitals stable. frontal/ maxillary tenderness evident positive nasal congestion. pharynx normal neck supple  lungs clear/no crackles or wheezes. heart regular in rhythm        Assessment & Plan:  Impression rhinosinusitis likely post viral could well of started as the flu discussed, discussed with patient. plan antibiotics prescribed. Questions answered. Symptomatic care discussed. warning signs discussed. WSL

## 2016-08-27 ENCOUNTER — Other Ambulatory Visit: Payer: Self-pay | Admitting: Rheumatology

## 2016-08-27 NOTE — Telephone Encounter (Signed)
Last Visit: 06/15/16 Next Visit: 11/16/16 Labs: 06/15/16 WNL TB Gold: 06/15/16 Neg  Okay to refill Simponi?

## 2016-08-29 ENCOUNTER — Telehealth: Payer: Self-pay | Admitting: Adult Health

## 2016-08-29 NOTE — Telephone Encounter (Signed)
Left message x 1. JSY 

## 2016-08-29 NOTE — Telephone Encounter (Signed)
Pt called stating that she went to the pharmacy to ask for a refill of her Birth control medication. The pharmacy told her that her insurance will not pay for that birth control medication anymore. Pt would like to know if [facility] could call her something else like it or something her insurance will be able to accept. Please contact pt

## 2016-08-29 NOTE — Telephone Encounter (Signed)
Spoke with pt. Pt is on Lo Loestrin but it's to expensive. Pt to come by and get coupon card. [place]

## 2016-09-12 ENCOUNTER — Other Ambulatory Visit: Payer: Self-pay

## 2016-09-12 DIAGNOSIS — Z79899 Other long term (current) drug therapy: Secondary | ICD-10-CM

## 2016-09-12 LAB — COMPREHENSIVE METABOLIC PANEL
ALBUMIN: 3.8 g/dL (ref 3.6–5.1)
ALT: 19 U/L (ref 6–29)
AST: 24 U/L (ref 10–30)
Alkaline Phosphatase: 56 U/L (ref 33–115)
BUN: 11 mg/dL (ref 7–25)
CHLORIDE: 105 mmol/L (ref 98–110)
CO2: 27 mmol/L (ref 20–31)
CREATININE: 0.87 mg/dL (ref 0.50–1.10)
Calcium: 9.4 mg/dL (ref 8.6–10.2)
Glucose, Bld: 83 mg/dL (ref 65–99)
Potassium: 3.8 mmol/L (ref 3.5–5.3)
SODIUM: 140 mmol/L (ref 135–146)
Total Bilirubin: 0.4 mg/dL (ref 0.2–1.2)
Total Protein: 6.6 g/dL (ref 6.1–8.1)

## 2016-09-12 LAB — CBC WITH DIFFERENTIAL/PLATELET
BASOS PCT: 0 %
Basophils Absolute: 0 cells/uL (ref 0–200)
EOS ABS: 80 {cells}/uL (ref 15–500)
Eosinophils Relative: 2 %
HEMATOCRIT: 38.9 % (ref 35.0–45.0)
Hemoglobin: 12.8 g/dL (ref 11.7–15.5)
Lymphocytes Relative: 28 %
Lymphs Abs: 1120 cells/uL (ref 850–3900)
MCH: 29.1 pg (ref 27.0–33.0)
MCHC: 32.9 g/dL (ref 32.0–36.0)
MCV: 88.4 fL (ref 80.0–100.0)
MONO ABS: 440 {cells}/uL (ref 200–950)
MPV: 10.3 fL (ref 7.5–12.5)
Monocytes Relative: 11 %
NEUTROS ABS: 2360 {cells}/uL (ref 1500–7800)
Neutrophils Relative %: 59 %
Platelets: 197 10*3/uL (ref 140–400)
RBC: 4.4 MIL/uL (ref 3.80–5.10)
RDW: 14.5 % (ref 11.0–15.0)
WBC: 4 10*3/uL (ref 3.8–10.8)

## 2016-09-17 ENCOUNTER — Telehealth: Payer: Self-pay | Admitting: *Deleted

## 2016-09-17 MED ORDER — NORETHIN ACE-ETH ESTRAD-FE 1-20 MG-MCG(24) PO TABS
1.0000 | ORAL_TABLET | Freq: Every day | ORAL | 11 refills | Status: DC
Start: 2016-09-17 — End: 2016-09-17

## 2016-09-17 NOTE — Telephone Encounter (Signed)
Karen Burton is not covered by her insurance, is $170 and coupons are not valid for cash pay. Is there alternative? Please advise.

## 2016-09-17 NOTE — Telephone Encounter (Signed)
Pt says lo loestrin costs toomuch with new plan, will try loestrin 24 fe generic

## 2016-09-26 ENCOUNTER — Telehealth: Payer: Self-pay | Admitting: Pharmacist

## 2016-09-26 NOTE — Telephone Encounter (Signed)
Received a letter from [employer] company regarding underuse of Simponi.  "Based on pharmacy claims, this patient may be non-adherent to their biologic medication."  Last refill date was 08/09/16.    I called patient to discuss.  Patient confirms she is late for her Simponi dose.  She reports she had her last injection in mid-January, but has not had her Simponi yet this month.  Noted refill of Simponi was sent to [place] on 08/27/16.  Patient reports she missed the calls from the pharmacy to schedule delivery of her medication, and she forgot to call them back.  Patient denies any adverse effects from the medication and denies any concerns with medication cost.  I reviewed importance of adherence with Simponi to prevent flares.  Patient voiced understanding and reports she is going to do better about being adherent to her medications.  Advised setting a remind on her phone and patient voiced understanding.    [name], Pharm.D., BCPS, CPP Clinical Pharmacist Pager: [phone] Phone: [phone] 09/26/2016 1:13 PM

## 2016-11-02 NOTE — Progress Notes (Signed)
Office Visit Note  Patient: Karen Burton             Date of Birth: 1976/01/01           MRN: [id number]             PCP: Mickie Hillier, MD Referring: Mikey Kirschner, MD Visit Date: 11/09/2016 Occupation: @GUAROCC @    Subjective:  Fatigue   History of Present Illness: Karen Burton is a 41 y.o. female with history of sero positive rheumatoid arthritis. She states she's been having increased fatigue lately. She states for the last few months she was having discomfort and pain in her right wrist and she was experiencing some popping sensation. She never developed any swelling. She has some generalized pain from fibromyalgia. She states her lower extremity feels weak and tired.   Activities of Daily Living:  Patient reports morning stiffness for 0 minute.   Patient Denies nocturnal pain.  Difficulty dressing/grooming: Denies Difficulty climbing stairs: Denies Difficulty getting out of chair: Denies Difficulty using hands for taps, buttons, cutlery, and/or writing: Denies   Review of Systems  Constitutional: Positive for fatigue. Negative for night sweats, weight gain, weight loss and weakness.  HENT: Negative for mouth sores, trouble swallowing, trouble swallowing, mouth dryness and nose dryness.   Eyes: Negative for pain, redness, visual disturbance and dryness.  Respiratory: Negative for cough, shortness of breath and difficulty breathing.   Cardiovascular: Negative for chest pain, palpitations, hypertension, irregular heartbeat and swelling in legs/feet.  Gastrointestinal: Negative for blood in stool, constipation and diarrhea.  Endocrine: Negative for increased urination.  Genitourinary: Negative for vaginal dryness.  Musculoskeletal: Positive for arthralgias, joint pain and morning stiffness. Negative for joint swelling, myalgias, muscle weakness, muscle tenderness and myalgias.  Skin: Negative for color change, rash, hair loss, skin tightness, ulcers and  sensitivity to sunlight.  Allergic/Immunologic: Negative for susceptible to infections.  Neurological: Negative for dizziness, memory loss and night sweats.  Hematological: Negative for swollen glands.  Psychiatric/Behavioral: Positive for sleep disturbance. Negative for depressed mood. The patient is not nervous/anxious.     PMFS History:  Patient Active Problem List   Diagnosis Date Noted  . ANA positive 11/06/2016  . Fibromyalgia 11/06/2016  . Menorrhagia with regular cycle 12/20/2015  . Dysmenorrhea 12/20/2015  . Fibroids 12/20/2015  . Vulvar itching 12/20/2015  . Anemia 12/20/2015  . Essential hypertension, benign 01/12/2013  . Esophageal reflux 01/12/2013  . Rheumatoid arthritis ([place]) 01/12/2013  . Rash and nonspecific skin eruption 01/12/2013    Past Medical History:  Diagnosis Date  . Anemia   . Arthritis   . Dysmenorrhea 12/20/2015  . Eczema    mild  . Fibroids 12/20/2015  . Fibromyalgia   . Menorrhagia with regular cycle 12/20/2015  . Rash   . Reflux   . Rhinitis    chonic  . Vitamin D deficiency   . Vulvar itching 12/20/2015    Family History  Problem Relation Age of Onset  . Diabetes Mother   . Hypertension Mother   . Asthma Mother   . Cancer Mother     breast  . Thyroid disease Mother   . Arthritis Mother   . Graves' disease Mother   . Cancer Maternal Grandmother     pancreatic   Past Surgical History:  Procedure Laterality Date  . ESOPHAGOGASTRODUODENOSCOPY ENDOSCOPY    . IR GENERIC HISTORICAL  02/07/2016   IR RADIOLOGIST EVAL & MGMT 02/07/2016 [name], MD [facility] INTERV RAD  .  WISDOM TOOTH EXTRACTION     Social History   Social History Narrative  . No narrative on file     Objective: Vital Signs: BP 122/70   Pulse 78   Resp 16   Wt 158 lb (71.7 kg)   LMP 11/08/2016   BMI 26.29 kg/m    Physical Exam  Constitutional: She is oriented to person, place, and time. She appears well-developed and well-nourished.  HENT:  Head:  Normocephalic and atraumatic.  Eyes: Conjunctivae and EOM are normal.  Neck: Normal range of motion.  Cardiovascular: Normal rate, regular rhythm, normal heart sounds and intact distal pulses.   Pulmonary/Chest: Effort normal and breath sounds normal.  Abdominal: Soft. Bowel sounds are normal.  Lymphadenopathy:    She has no cervical adenopathy.  Neurological: She is alert and oriented to person, place, and time.  Skin: Skin is warm and dry. Capillary refill takes less than 2 seconds.  Psychiatric: She has a normal mood and affect. Her behavior is normal.  Nursing note and vitals reviewed.    Musculoskeletal Exam: C-spine and thoracic lumbar spine good range of motion. Shoulder joints elbow joints wrist joint MCPs PIPs DIPs with good range of motion with no synovitis. Hip joints knee joints ankles MTPs PIPs DIPs with good range of motion with no synovitis. Fibromyalgia tender points were 18 out of 18 positive. She generalize hyperalgesia.  CDAI Exam: CDAI Homunculus Exam:   Joint Counts:  CDAI Tender Joint count: 0 CDAI Swollen Joint count: 0  Global Assessments:  Patient Global Assessment: 3 Provider Global Assessment: 3  CDAI Calculated Score: 6    Investigation: Findings:  08/24/2015 After informed consent was obtained, ultrasound examination of bilateral hands was  performed using 12 megahertz transducer, gray scale and power Doppler.  Bilateral 2nd, 3rd and 5th MCP joints and bilateral wrist joints, both dorsal and volar aspects were evaluated to look for any synovitis or tenosynovitis.  The findings were she had some joint space narrowing in bilateral 2nd and 3rd MCP joints without any synovitis and tenosynovitis.  There was no synovitis/tenosynovitis noted in wrist joint.  Right median nerve was 0.09 cm2 and left median nerve was 0.08 cm2, which were within normal limits.    09/12/2016 CBC normal, CMP normal    Imaging: Xr Foot 2 Views Left  Result Date: 11/09/2016 No  MTP, PIP, DIP or intertarsal joint space narrowing was noted. No erosive changes were noted. Impression: Normal x-ray of the left foot  Xr Foot 2 Views Right  Result Date: 11/09/2016 No MTP PIP or DIP narrowing was noted. No erosive changes were noted. No intertarsal space narrowing was noted. Impression: Normal x-ray of the right foot  Xr Hand 2 View Left  Result Date: 11/09/2016 No MCP PIP/DIP or intercarpal joint space narrowing was noted. No erosive changes were noted. Impression: Normal x-ray of hand  Xr Hand 2 View Right  Result Date: 11/09/2016 No MCP PIP/DIP or intercarpal joint space narrowing was noted. No erosive changes were noted. Impression: Normal x-ray of hand   Speciality Comments: No specialty comments available.    Procedures:  No procedures performed Allergies: Ceftin [cefuroxime axetil]; Humira [adalimumab]; and Methotrexate derivatives   Assessment / Plan:     Visit Diagnoses: Rheumatoid arthritis involving multiple sites with positive rheumatoid factor (HCC) - Positive RF, negative anti-CCP, positive ANA. She had no synovitis on examination today. She had no tenderness on palpation over the right wrist joint. She complains of ongoing discomfort in her hands  and feet but no swelling. X-rays obtained of her hands and feet today were within normal limits. We will continue current treatment for right now.  High risk medication use - On Simponi subcutaneous, TB: 06/09/2017. Her labs were normal in February which include CBC and CMP  Pain in both hands - Plan: XR Hand 2 View Right, XR Hand 2 View Left. Her x-rays today were unremarkable without any joint space narrowing or erosive changes.  Pain in both feet - Plan: XR Foot 2 Views Right, XR Foot 2 Views Left. Her x-rays were within normal limits without any erosive changes.  Fibromyalgia: She is continues to have active disease with generalized pain and positive tender points.  Other fatigue: Appears to be related  to insomnia. Good sleep hygiene was discussed.  Other medical problems are listed as follows:  History of hypertension  History of gastroesophageal reflux (GERD)  Menorrhagia with regular cycle - Chronic anemia due to excessive bleeding     Orders: Orders Placed This Encounter  Procedures  . XR Hand 2 View Right  . XR Hand 2 View Left  . XR Foot 2 Views Right  . XR Foot 2 Views Left   No orders of the defined types were placed in this encounter.   Face-to-face time spent with patient was 30 minutes. 50% of time was spent in counseling and coordination of care.  Follow-Up Instructions: Return in about 5 months (around 04/11/2017) for Rheumatoid arthritis.   Bo Merino, MD  Note - This record has been created using Editor, commissioning.  Chart creation errors have been sought, but may not always  have been located. Such creation errors do not reflect on  the standard of medical care.

## 2016-11-06 DIAGNOSIS — R768 Other specified abnormal immunological findings in serum: Secondary | ICD-10-CM | POA: Insufficient documentation

## 2016-11-06 DIAGNOSIS — M797 Fibromyalgia: Secondary | ICD-10-CM | POA: Insufficient documentation

## 2016-11-09 ENCOUNTER — Encounter: Payer: Self-pay | Admitting: Rheumatology

## 2016-11-09 ENCOUNTER — Ambulatory Visit (INDEPENDENT_AMBULATORY_CARE_PROVIDER_SITE_OTHER): Payer: Self-pay

## 2016-11-09 ENCOUNTER — Ambulatory Visit (INDEPENDENT_AMBULATORY_CARE_PROVIDER_SITE_OTHER): Payer: BLUE CROSS/BLUE SHIELD | Admitting: Rheumatology

## 2016-11-09 VITALS — BP 122/70 | HR 78 | Resp 16 | Wt 158.0 lb

## 2016-11-09 DIAGNOSIS — M797 Fibromyalgia: Secondary | ICD-10-CM

## 2016-11-09 DIAGNOSIS — R5383 Other fatigue: Secondary | ICD-10-CM

## 2016-11-09 DIAGNOSIS — M79642 Pain in left hand: Secondary | ICD-10-CM | POA: Diagnosis not present

## 2016-11-09 DIAGNOSIS — M79671 Pain in right foot: Secondary | ICD-10-CM

## 2016-11-09 DIAGNOSIS — M79641 Pain in right hand: Secondary | ICD-10-CM | POA: Diagnosis not present

## 2016-11-09 DIAGNOSIS — Z79899 Other long term (current) drug therapy: Secondary | ICD-10-CM

## 2016-11-09 DIAGNOSIS — Z8679 Personal history of other diseases of the circulatory system: Secondary | ICD-10-CM | POA: Diagnosis not present

## 2016-11-09 DIAGNOSIS — Z8719 Personal history of other diseases of the digestive system: Secondary | ICD-10-CM

## 2016-11-09 DIAGNOSIS — M0579 Rheumatoid arthritis with rheumatoid factor of multiple sites without organ or systems involvement: Secondary | ICD-10-CM

## 2016-11-09 DIAGNOSIS — M79672 Pain in left foot: Secondary | ICD-10-CM

## 2016-11-09 DIAGNOSIS — N920 Excessive and frequent menstruation with regular cycle: Secondary | ICD-10-CM | POA: Diagnosis not present

## 2016-11-16 ENCOUNTER — Ambulatory Visit: Payer: BLUE CROSS/BLUE SHIELD | Admitting: Rheumatology

## 2016-11-26 ENCOUNTER — Other Ambulatory Visit: Payer: Self-pay | Admitting: Rheumatology

## 2016-11-26 NOTE — Telephone Encounter (Signed)
11/09/16 last visit  04/05/17 next visit Ok to refill per Dr Estanislado Pandy

## 2016-12-05 ENCOUNTER — Other Ambulatory Visit: Payer: Self-pay | Admitting: Rheumatology

## 2016-12-05 NOTE — Telephone Encounter (Signed)
11/09/16 last visit  04/05/17 next visit Labs: 09/12/16 WNL TB Gold: 06/15/16 Neg  Okay to refill Simponi?

## 2016-12-05 NOTE — Telephone Encounter (Signed)
Ok, labs due this month

## 2017-01-11 ENCOUNTER — Telehealth: Payer: Self-pay | Admitting: Pharmacist

## 2017-01-11 NOTE — Telephone Encounter (Signed)
I received a note from patient's insurance "Based on pharmacy claims, this patient may be non-adherent to their biologic medication... Medication history: 11/05/16."  I reviewed patient's chart.  Simponi was refilled on 12/05/16.  I spoke to [place] at [place] who confirms patient filled prescription on 12/07/16 and 01/08/17.    I spoke to patient who confirms she is taking Simponi 50 mg subcutaneous once monthly as prescribed.  I did notice that she is past due for labs as most recent labs were drawn 09/12/16.  Patient confirms she will go get labs next week.  Patient denies any further questions or concerns regarding her medications at this time.   [name], Pharm.D., BCPS, CPP Clinical Pharmacist Pager: [phone] Phone: [phone] 01/11/2017 3:45 PM

## 2017-02-20 ENCOUNTER — Other Ambulatory Visit: Payer: Self-pay | Admitting: *Deleted

## 2017-02-20 ENCOUNTER — Telehealth: Payer: Self-pay | Admitting: Rheumatology

## 2017-02-20 DIAGNOSIS — Z79899 Other long term (current) drug therapy: Secondary | ICD-10-CM

## 2017-02-20 NOTE — Telephone Encounter (Signed)
Lab orders released and faxed.  

## 2017-02-20 NOTE — Telephone Encounter (Signed)
Please release orders for [facility] lab on [address] in [place]. Patient will be going this afternoon to have labs drawn.

## 2017-02-27 LAB — CBC WITH DIFFERENTIAL/PLATELET
BASOS ABS: 0 {cells}/uL (ref 0–200)
Basophils Relative: 0 %
EOS ABS: 45 {cells}/uL (ref 15–500)
EOS PCT: 1 %
HCT: 40.4 % (ref 35.0–45.0)
HEMOGLOBIN: 13.3 g/dL (ref 11.7–15.5)
LYMPHS ABS: 1305 {cells}/uL (ref 850–3900)
Lymphocytes Relative: 29 %
MCH: 30 pg (ref 27.0–33.0)
MCHC: 32.9 g/dL (ref 32.0–36.0)
MCV: 91.2 fL (ref 80.0–100.0)
MPV: 10.3 fL (ref 7.5–12.5)
Monocytes Absolute: 495 cells/uL (ref 200–950)
Monocytes Relative: 11 %
NEUTROS ABS: 2655 {cells}/uL (ref 1500–7800)
NEUTROS PCT: 59 %
Platelets: 275 10*3/uL (ref 140–400)
RBC: 4.43 MIL/uL (ref 3.80–5.10)
RDW: 13.8 % (ref 11.0–15.0)
WBC: 4.5 10*3/uL (ref 3.8–10.8)

## 2017-02-28 LAB — COMPREHENSIVE METABOLIC PANEL
ALK PHOS: 49 U/L (ref 33–115)
ALT: 26 U/L (ref 6–29)
AST: 23 U/L (ref 10–30)
Albumin: 3.8 g/dL (ref 3.6–5.1)
BUN: 10 mg/dL (ref 7–25)
CALCIUM: 8.8 mg/dL (ref 8.6–10.2)
CO2: 27 mmol/L (ref 20–31)
Chloride: 102 mmol/L (ref 98–110)
Creat: 0.87 mg/dL (ref 0.50–1.10)
GLUCOSE: 78 mg/dL (ref 65–99)
POTASSIUM: 4 mmol/L (ref 3.5–5.3)
Sodium: 138 mmol/L (ref 135–146)
Total Bilirubin: 0.3 mg/dL (ref 0.2–1.2)
Total Protein: 6.7 g/dL (ref 6.1–8.1)

## 2017-02-28 NOTE — Progress Notes (Signed)
Within normal limits

## 2017-03-05 ENCOUNTER — Other Ambulatory Visit: Payer: Self-pay | Admitting: Rheumatology

## 2017-03-05 ENCOUNTER — Other Ambulatory Visit: Payer: Self-pay | Admitting: Family Medicine

## 2017-03-05 NOTE — Telephone Encounter (Signed)
11/09/16 last visit  04/05/17 next visit  Okay to refill per Dr. Estanislado Pandy

## 2017-03-13 ENCOUNTER — Encounter: Payer: Self-pay | Admitting: Family Medicine

## 2017-03-13 ENCOUNTER — Ambulatory Visit (INDEPENDENT_AMBULATORY_CARE_PROVIDER_SITE_OTHER): Payer: BLUE CROSS/BLUE SHIELD | Admitting: Family Medicine

## 2017-03-13 VITALS — BP 128/86 | Ht 65.0 in | Wt 165.0 lb

## 2017-03-13 DIAGNOSIS — I10 Essential (primary) hypertension: Secondary | ICD-10-CM | POA: Diagnosis not present

## 2017-03-13 MED ORDER — HYDROCHLOROTHIAZIDE 12.5 MG PO CAPS: 12.5000 mg | ORAL_CAPSULE | Freq: Every morning | ORAL | 5 refills | Status: DC

## 2017-03-13 MED ORDER — AMLODIPINE BESYLATE 5 MG PO TABS: 5.0000 mg | ORAL_TABLET | Freq: Every day | ORAL | 5 refills | Status: DC

## 2017-03-13 NOTE — Progress Notes (Signed)
   Subjective:    Patient ID: Edwyna Shell, female    DOB: [birth year], 41 y.o.   MRN: [id number]  Hypertension  This is a chronic problem. The current episode started more than 1 year ago.   Grad with [employer]. 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   BP often a bit elev at work, syst often around 150 or so   Watching salt intake  Work fairly stressful and will bump up the b p  Review of Systems No headache, no major weight loss or weight gain, no chest pain no back pain abdominal pain no change in bowel habits complete ROS otherwise negative     Objective:   Physical Exam Alert vitals stable, NAD. Blood pressure good on repeat. HEENT normal. Lungs clear. Heart regular rate and rhythm.        Assessment & Plan:  Impression hypertension good control discussed maintain same meds. Compliance discussed. Recheck in 6 months. Blood work done elsewhere reviewed for patient

## 2017-03-29 ENCOUNTER — Other Ambulatory Visit: Payer: Self-pay | Admitting: Rheumatology

## 2017-03-29 NOTE — Telephone Encounter (Signed)
11/09/16 last visit  04/05/17 next visit Labs: 02/27/17 WNL TB Gold: 06/15/16 Neg  Okay to refill per Dr. Estanislado Pandy

## 2017-03-31 NOTE — Progress Notes (Signed)
Office Visit Note  Patient: Karen Burton             Date of Birth: 09-25-1975           MRN: [id number]             PCP: Mikey Kirschner, MD Referring: Mikey Kirschner, MD Visit Date: 04/05/2017 Occupation: @GUAROCC @    Subjective:  Pain in hands.   History of Present Illness: Karen Burton is a 41 y.o. female with history of some sero positive rheumatoid arthritis. She states she's been doing quite well on Simponi subcutaneous. She braided her hair couple of weeks ago and after that she started experiencing some discomfort in her hands and her feet as she was standing for prolonged time. The pain is getting better. She denies any joint swelling. I'll see her fatigue is better  Activities of Daily Living:  Patient reports morning stiffness for 2  minute.   Patient Denies nocturnal pain.  Difficulty dressing/grooming: Reports Difficulty climbing stairs: Denies Difficulty getting out of chair: Denies Difficulty using hands for taps, buttons, cutlery, and/or writing: Denies   Review of Systems  Constitutional: Positive for fatigue, night sweats and weight gain. Negative for weight loss and weakness.  HENT: Positive for mouth dryness and nose dryness. Negative for mouth sores, trouble swallowing and trouble swallowing.   Eyes: Negative for pain, redness, visual disturbance and dryness.  Respiratory: Negative.  Negative for cough, shortness of breath and difficulty breathing.   Cardiovascular: Positive for hypertension. Negative for chest pain, palpitations, irregular heartbeat and swelling in legs/feet.  Gastrointestinal: Negative.  Negative for blood in stool, constipation and diarrhea.  Endocrine: Negative.  Negative for increased urination.  Genitourinary: Negative.  Negative for nocturia and vaginal dryness.  Musculoskeletal: Positive for arthralgias, joint pain and joint swelling. Negative for myalgias, muscle weakness, morning stiffness, muscle tenderness and  myalgias.  Skin: Negative.  Negative for color change, rash, hair loss, skin tightness, ulcers and sensitivity to sunlight.  Allergic/Immunologic: Negative for susceptible to infections.  Neurological: Negative.  Positive for night sweats. Negative for dizziness, headaches and memory loss.  Hematological: Negative.  Negative for swollen glands.  Psychiatric/Behavioral: Negative.  Negative for depressed mood and sleep disturbance. The patient is not nervous/anxious.     PMFS History:  Patient Active Problem List   Diagnosis Date Noted  . ANA positive 11/06/2016  . Fibromyalgia 11/06/2016  . Menorrhagia with regular cycle 12/20/2015  . Dysmenorrhea 12/20/2015  . Fibroids 12/20/2015  . Vulvar itching 12/20/2015  . Anemia 12/20/2015  . Essential hypertension, benign 01/12/2013  . Esophageal reflux 01/12/2013  . Rheumatoid arthritis ([place]) 01/12/2013  . Rash and nonspecific skin eruption 01/12/2013    Past Medical History:  Diagnosis Date  . Anemia   . Arthritis   . Dysmenorrhea 12/20/2015  . Eczema    mild  . Fibroids 12/20/2015  . Fibromyalgia   . Menorrhagia with regular cycle 12/20/2015  . Rash   . Reflux   . Rhinitis    chonic  . Vitamin D deficiency   . Vulvar itching 12/20/2015    Family History  Problem Relation Age of Onset  . Diabetes Mother   . Hypertension Mother   . Asthma Mother   . Cancer Mother        breast  . Thyroid disease Mother   . Arthritis Mother   . Graves' disease Mother   . Cancer Maternal Grandmother        pancreatic  Past Surgical History:  Procedure Laterality Date  . ESOPHAGOGASTRODUODENOSCOPY ENDOSCOPY    . IR GENERIC HISTORICAL  02/07/2016   IR RADIOLOGIST EVAL & MGMT 02/07/2016 [name], MD [facility] INTERV RAD  . WISDOM TOOTH EXTRACTION     Social History   Social History Narrative  . No narrative on file     Objective: Vital Signs: BP (!) 145/98   Pulse 95   Resp 14   Ht 5\' 5"  (1.651 m)   Wt 216 lb (98 kg)   BMI 35.94  kg/m    Physical Exam  Constitutional: She is oriented to person, place, and time. She appears well-developed and well-nourished.  HENT:  Head: Normocephalic and atraumatic.  Eyes: Conjunctivae and EOM are normal.  Neck: Normal range of motion.  Cardiovascular: Normal rate, regular rhythm, normal heart sounds and intact distal pulses.   Pulmonary/Chest: Effort normal and breath sounds normal.  Abdominal: Soft. Bowel sounds are normal.  Lymphadenopathy:    She has no cervical adenopathy.  Neurological: She is alert and oriented to person, place, and time.  Skin: Skin is warm and dry. Capillary refill takes less than 2 seconds.  Psychiatric: She has a normal mood and affect. Her behavior is normal.  Nursing note and vitals reviewed.    Musculoskeletal Exam: C-spine or acid lumbar spine good range of motion. Shoulder joints although joints wrist joint MCPs PIPs DIPs with good range of motion with no synovitis. Hip joints knee joints ankles MTPs PIPs DIPs are good range of motion with no synovitis.  CDAI Exam: CDAI Homunculus Exam:   Joint Counts:  CDAI Tender Joint count: 0 CDAI Swollen Joint count: 0  Global Assessments:  Patient Global Assessment: 4 Provider Global Assessment: 2  CDAI Calculated Score: 6    Investigation: No additional findings.TB Gold: Negative in 05/2016 CBC Latest Ref Rng & Units 02/27/2017 09/12/2016 06/15/2016  WBC 3.8 - 10.8 K/uL 4.5 4.0 3.9  Hemoglobin 11.7 - 15.5 g/dL 13.3 12.8 12.7  Hematocrit 35.0 - 45.0 % 40.4 38.9 39.8  Platelets 140 - 400 K/uL 275 197 277   CMP Latest Ref Rng & Units 02/27/2017 09/12/2016 06/15/2016  Glucose 65 - 99 mg/dL 78 83 79  BUN 7 - 25 mg/dL 10 11 10   Creatinine 0.50 - 1.10 mg/dL 0.87 0.87 0.68  Sodium 135 - 146 mmol/L 138 140 141  Potassium 3.5 - 5.3 mmol/L 4.0 3.8 4.1  Chloride 98 - 110 mmol/L 102 105 107  CO2 20 - 31 mmol/L 27 27 28   Calcium 8.6 - 10.2 mg/dL 8.8 9.4 9.1  Total Protein 6.1 - 8.1 g/dL 6.7 6.6 6.6    Total Bilirubin 0.2 - 1.2 mg/dL 0.3 0.4 0.3  Alkaline Phos 33 - 115 U/L 49 56 50  AST 10 - 30 U/L 23 24 22   ALT 6 - 29 U/L 26 19 18     Imaging: No results found.  Speciality Comments: No specialty comments available.    Procedures:  No procedures performed Allergies: Ceftin [cefuroxime axetil]; Humira [adalimumab]; and Methotrexate derivatives   Assessment / Plan:     Visit Diagnoses: Rheumatoid arthritis involving multiple sites with positive rheumatoid factor (HCC) - Positive RF, negative anti-CCP, positive ANA. She had mild flare with increased pain and discomfort after braiding her hair but the symptoms have subsided now. She had no synovitis on examination today.  High risk medication use - Simponi subcutaneous -we'll check labs in November and then every 3 months. Plan: CBC with Differential/Platelet, COMPLETE  METABOLIC PANEL WITH GFR, Quantiferon tb gold assay (blood) is also due in November.  Fibromyalgia: She does have some generalized pain and discomfort from underlying fibromyalgia. Cymbalta does help a refill on Cymbalta was also given.  Other fatigue: She reports ongoing fatigue due to insomnia.  History of insomnia: Good sleep hygiene was discussed.  History of hypertension: Blood pressure is a still elevated at advised her to monitor blood pressure closely.  History of gastroesophageal reflux (GERD)  Menorrhagia with regular cycle - Chronic anemia due to excessive bleeding     Orders: Orders Placed This Encounter  Procedures  . CBC with Differential/Platelet  . COMPLETE METABOLIC PANEL WITH GFR  . Quantiferon tb gold assay (blood)   Meds ordered this encounter  Medications  . DULoxetine (CYMBALTA) 60 MG capsule    Sig: Take 1 capsule (60 mg total) by mouth daily.    Dispense:  30 capsule    Refill:  5    Face-to-face time spent with patient was 30 minutes. Greater than 50% of time was spent in counseling and coordination of care.  Follow-Up  Instructions: Return in about 5 months (around 09/05/2017) for Rheumatoid arthritis FMS .   Bo Merino, MD  Note - This record has been created using Editor, commissioning.  Chart creation errors have been sought, but may not always  have been located. Such creation errors do not reflect on  the standard of medical care.

## 2017-04-05 ENCOUNTER — Encounter: Payer: Self-pay | Admitting: Rheumatology

## 2017-04-05 ENCOUNTER — Ambulatory Visit (INDEPENDENT_AMBULATORY_CARE_PROVIDER_SITE_OTHER): Payer: BLUE CROSS/BLUE SHIELD | Admitting: Rheumatology

## 2017-04-05 VITALS — BP 145/98 | HR 95 | Resp 14 | Ht 65.0 in | Wt 216.0 lb

## 2017-04-05 DIAGNOSIS — Z8679 Personal history of other diseases of the circulatory system: Secondary | ICD-10-CM

## 2017-04-05 DIAGNOSIS — M797 Fibromyalgia: Secondary | ICD-10-CM | POA: Diagnosis not present

## 2017-04-05 DIAGNOSIS — Z79899 Other long term (current) drug therapy: Secondary | ICD-10-CM

## 2017-04-05 DIAGNOSIS — Z87898 Personal history of other specified conditions: Secondary | ICD-10-CM | POA: Diagnosis not present

## 2017-04-05 DIAGNOSIS — Z8719 Personal history of other diseases of the digestive system: Secondary | ICD-10-CM | POA: Diagnosis not present

## 2017-04-05 DIAGNOSIS — N920 Excessive and frequent menstruation with regular cycle: Secondary | ICD-10-CM | POA: Diagnosis not present

## 2017-04-05 DIAGNOSIS — M0579 Rheumatoid arthritis with rheumatoid factor of multiple sites without organ or systems involvement: Secondary | ICD-10-CM | POA: Diagnosis not present

## 2017-04-05 DIAGNOSIS — R5383 Other fatigue: Secondary | ICD-10-CM | POA: Diagnosis not present

## 2017-04-05 MED ORDER — DULOXETINE HCL 60 MG PO CPEP: 60.0000 mg | ORAL_CAPSULE | Freq: Every day | ORAL | 5 refills | Status: DC

## 2017-04-05 NOTE — Patient Instructions (Signed)
Standing Labs We placed an order today for your standing lab work.    Please come back and get your standing labs in November with TB gold and every 3 months  We have open lab Monday through Friday from 8:30-11:30 AM and 1:30-4 PM at the office of Dr. Bo Merino.   The office is located at [address] [place], [place], [place] [address] No appointment is necessary.   Labs are drawn by [facility].  You may receive a bill from [facility] for your lab work. If you have any questions regarding directions or hours of operation,  please call [phone].

## 2017-06-14 ENCOUNTER — Other Ambulatory Visit: Payer: Self-pay | Admitting: Family Medicine

## 2017-06-14 DIAGNOSIS — Z1231 Encounter for screening mammogram for malignant neoplasm of breast: Secondary | ICD-10-CM

## 2017-06-24 ENCOUNTER — Ambulatory Visit (HOSPITAL_COMMUNITY)
Admission: RE | Admit: 2017-06-24 | Discharge: 2017-06-24 | Disposition: A | Discharge status: Home / Self Care | Payer: BLUE CROSS/BLUE SHIELD | Source: Ambulatory Visit | Attending: Family Medicine | Admitting: Family Medicine

## 2017-06-24 ENCOUNTER — Encounter (HOSPITAL_COMMUNITY): Payer: Self-pay

## 2017-06-24 DIAGNOSIS — Z1231 Encounter for screening mammogram for malignant neoplasm of breast: Secondary | ICD-10-CM | POA: Insufficient documentation

## 2017-07-08 ENCOUNTER — Other Ambulatory Visit: Payer: Self-pay | Admitting: Rheumatology

## 2017-07-08 DIAGNOSIS — Z79899 Other long term (current) drug therapy: Secondary | ICD-10-CM

## 2017-07-10 NOTE — Telephone Encounter (Signed)
Last Visit: 04/05/17 Next visit: 09/06/17 Labs: 02/27/17 WNL  TB Gold : 06/15/16 Neg   Patient to update labs 07/11/17  Okay to refill 30 day supply per Dr. Estanislado Pandy.

## 2017-07-14 LAB — CBC WITH DIFFERENTIAL/PLATELET
BASOS ABS: 40 {cells}/uL (ref 0–200)
BASOS PCT: 0.8 %
EOS ABS: 50 {cells}/uL (ref 15–500)
EOS PCT: 1 %
HEMATOCRIT: 37.1 % (ref 35.0–45.0)
HEMOGLOBIN: 12.7 g/dL (ref 11.7–15.5)
LYMPHS ABS: 1565 {cells}/uL (ref 850–3900)
MCH: 30.1 pg (ref 27.0–33.0)
MCHC: 34.2 g/dL (ref 32.0–36.0)
MCV: 87.9 fL (ref 80.0–100.0)
MPV: 10.7 fL (ref 7.5–12.5)
Monocytes Relative: 9.8 %
NEUTROS ABS: 2855 {cells}/uL (ref 1500–7800)
Neutrophils Relative %: 57.1 %
Platelets: 248 10*3/uL (ref 140–400)
RBC: 4.22 10*6/uL (ref 3.80–5.10)
RDW: 12.6 % (ref 11.0–15.0)
Total Lymphocyte: 31.3 %
WBC mixed population: 490 cells/uL (ref 200–950)
WBC: 5 10*3/uL (ref 3.8–10.8)

## 2017-07-14 LAB — COMPLETE METABOLIC PANEL WITH GFR
AG Ratio: 1.4 (calc) (ref 1.0–2.5)
ALBUMIN MSPROF: 3.8 g/dL (ref 3.6–5.1)
ALKALINE PHOSPHATASE (APISO): 45 U/L (ref 33–115)
ALT: 27 U/L (ref 6–29)
AST: 29 U/L (ref 10–30)
BUN: 11 mg/dL (ref 7–25)
CO2: 30 mmol/L (ref 20–32)
CREATININE: 0.84 mg/dL (ref 0.50–1.10)
Calcium: 8.9 mg/dL (ref 8.6–10.2)
Chloride: 104 mmol/L (ref 98–110)
GFR, EST AFRICAN AMERICAN: 100 mL/min/{1.73_m2} (ref >=60)
GFR, EST NON AFRICAN AMERICAN: 86 mL/min/{1.73_m2} (ref >=60)
GLOBULIN: 2.8 g/dL (ref 1.9–3.7)
Glucose, Bld: 103 mg/dL (ref 65–139)
Potassium: 3.5 mmol/L (ref 3.5–5.3)
SODIUM: 140 mmol/L (ref 135–146)
TOTAL PROTEIN: 6.6 g/dL (ref 6.1–8.1)
Total Bilirubin: 0.3 mg/dL (ref 0.2–1.2)

## 2017-07-14 LAB — QUANTIFERON-TB GOLD PLUS
Mitogen-NIL: >10 IU/mL
NIL: 0.03 [IU]/mL
QUANTIFERON-TB GOLD PLUS: NEGATIVE
TB1-NIL: 0 IU/mL
TB2-NIL: 0 IU/mL

## 2017-07-22 ENCOUNTER — Other Ambulatory Visit: Payer: Self-pay | Admitting: Rheumatology

## 2017-07-24 NOTE — Telephone Encounter (Signed)
Last Visit: 04/05/17 Next visit: 09/06/17 Labs: 12/14/18WNL  TB Gold : 07/12/17 Neg   Okay to refill per Dr. Estanislado Pandy

## 2017-08-12 ENCOUNTER — Ambulatory Visit (INDEPENDENT_AMBULATORY_CARE_PROVIDER_SITE_OTHER): Payer: BLUE CROSS/BLUE SHIELD | Admitting: Adult Health

## 2017-08-12 ENCOUNTER — Encounter: Payer: Self-pay | Admitting: Adult Health

## 2017-08-12 VITALS — BP 140/100 | HR 106 | Ht 65.25 in | Wt 169.0 lb

## 2017-08-12 DIAGNOSIS — Z3041 Encounter for surveillance of contraceptive pills: Secondary | ICD-10-CM | POA: Diagnosis not present

## 2017-08-12 DIAGNOSIS — Z01411 Encounter for gynecological examination (general) (routine) with abnormal findings: Secondary | ICD-10-CM

## 2017-08-12 DIAGNOSIS — D259 Leiomyoma of uterus, unspecified: Secondary | ICD-10-CM

## 2017-08-12 DIAGNOSIS — Z01419 Encounter for gynecological examination (general) (routine) without abnormal findings: Secondary | ICD-10-CM | POA: Insufficient documentation

## 2017-08-12 DIAGNOSIS — Z1211 Encounter for screening for malignant neoplasm of colon: Secondary | ICD-10-CM

## 2017-08-12 DIAGNOSIS — Z1212 Encounter for screening for malignant neoplasm of rectum: Secondary | ICD-10-CM

## 2017-08-12 DIAGNOSIS — I10 Essential (primary) hypertension: Secondary | ICD-10-CM

## 2017-08-12 LAB — HEMOCCULT GUIAC POC 1CARD (OFFICE): FECAL OCCULT BLD: NEGATIVE

## 2017-08-12 MED ORDER — JUNEL FE 24 1-20 MG-MCG(24) PO TABS: 1.0000 | ORAL_TABLET | Freq: Every day | ORAL | 4 refills | Status: DC

## 2017-08-12 NOTE — Progress Notes (Addendum)
Patient ID: Karen Burton, female   DOB: [birth year], 42 y.o.   MRN: [id number] History of Present Illness: Karen Burton is a 42 year old black female in for well woman gyn exam, she had normal pap with negative HPV 12/20/15. PCP is Dr Mickie Hillier and she sees Dr Rennis Chris.    Current Medications, Allergies, Past Medical History, Past Surgical History, Family History and Social History were reviewed in [employer] record.     Review of Systems: Patient denies any headaches, hearing loss, fatigue, blurred vision, shortness of breath, chest pain, abdominal pain, problems with bowel movements, urination, or intercourse(not having sex). No joint pain or mood swings.Has been sweating more. 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   Physical Exam:BP (!) 140/100 (BP Location: Right Arm, Patient Position: Sitting, Cuff Size: Normal)   Pulse (!) 106   Ht 5' 5.25" (1.657 m)   Wt 169 lb (76.7 kg)   BMI 27.91 kg/m  General:  Well developed, well nourished, no acute distress Skin:  Warm and dry Neck:  Midline trachea, normal thyroid, good ROM, no lymphadenopathy Lungs; Clear to auscultation bilaterally Breast:  No dominant palpable mass, retraction, or nipple discharge Cardiovascular: Regular rate and rhythm Abdomen:  Soft, non tender, no hepatosplenomegaly Pelvic:  External genitalia is normal in appearance, no lesions.  The vagina is normal in appearance. Urethra has no lesions or masses. The cervix is smooth.  Uterus is felt to be about 14 week size.  No adnexal masses or tenderness noted.Bladder is non tender, no masses felt. Rectal: Good sphincter tone, no polyps, or hemorrhoids felt.  Hemoccult negative. Extremities/musculoskeletal:  No swelling or varicosities noted, no clubbing or cyanosis Psych:  No mood changes, alert and  cooperative,seems happy PHQ 2 score 1.  Impression:  1. Encounter for well woman exam with routine gynecological exam   2. Screening for colorectal cancer   3. Uterine leiomyoma, unspecified location   4. Encounter for surveillance of contraceptive pills   5. Essential hypertension      Plan: Meds ordered this encounter  Medications  . JUNEL FE 24 1-20 MG-MCG(24) tablet    Sig: Take 1 tablet by mouth daily.    Dispense:  3 Package    Refill:  4    Order Specific Question:   Supervising Provider    Answer:   Tania Ade H [[id number]]  Pap and physical in 1 year Mammogram yearly Check BP in F/U Labs with PCP and Dr Estanislado Pandy

## 2017-08-16 ENCOUNTER — Encounter: Payer: Self-pay | Admitting: Family Medicine

## 2017-08-16 ENCOUNTER — Ambulatory Visit: Payer: BLUE CROSS/BLUE SHIELD | Admitting: Family Medicine

## 2017-08-16 VITALS — BP 134/88 | Ht 65.0 in | Wt 171.0 lb

## 2017-08-16 DIAGNOSIS — I10 Essential (primary) hypertension: Secondary | ICD-10-CM | POA: Diagnosis not present

## 2017-08-16 MED ORDER — AMLODIPINE BESYLATE 5 MG PO TABS: 5.0000 mg | ORAL_TABLET | Freq: Every day | ORAL | 5 refills | Status: DC

## 2017-08-16 MED ORDER — HYDROCHLOROTHIAZIDE 12.5 MG PO CAPS: 12.5000 mg | ORAL_CAPSULE | Freq: Every morning | ORAL | 5 refills | Status: DC

## 2017-08-16 NOTE — Patient Instructions (Signed)
Exercising to Stay Healthy Exercising regularly is important. It has many health benefits, such as:  Improving your overall fitness, flexibility, and endurance.  Increasing your bone density.  Helping with weight control.  Decreasing your body fat.  Increasing your muscle strength.  Reducing stress and tension.  Improving your overall health.  In order to become healthy and stay healthy, it is recommended that you do moderate-intensity and vigorous-intensity exercise. You can tell that you are exercising at a moderate intensity if you have a higher heart rate and faster breathing, but you are still able to hold a conversation. You can tell that you are exercising at a vigorous intensity if you are breathing much harder and faster and cannot hold a conversation while exercising. How often should I exercise? Choose an activity that you enjoy and set realistic goals. Your health care provider can help you to make an activity plan that works for you. Exercise regularly as directed by your health care provider. This may include:  Doing resistance training twice each week, such as: ? Push-ups. ? Sit-ups. ? Lifting weights. ? Using resistance bands.  Doing a given intensity of exercise for a given amount of time. Choose from these options: ? 150 minutes of moderate-intensity exercise every week. ? 75 minutes of vigorous-intensity exercise every week. ? A mix of moderate-intensity and vigorous-intensity exercise every week.  Children, pregnant women, people who are out of shape, people who are overweight, and older adults may need to consult a health care provider for individual recommendations. If you have any sort of medical condition, be sure to consult your health care provider before starting a new exercise program. What are some exercise ideas? Some moderate-intensity exercise ideas include:  Walking at a rate of 1 mile in 15  minutes.  Biking.  Hiking.  Golfing.  Dancing.  Some vigorous-intensity exercise ideas include:  Walking at a rate of at least 4.5 miles per hour.  Jogging or running at a rate of 5 miles per hour.  Biking at a rate of at least 10 miles per hour.  Lap swimming.  Roller-skating or in-line skating.  Cross-country skiing.  Vigorous competitive sports, such as football, basketball, and soccer.  Jumping rope.  Aerobic dancing.  What are some everyday activities that can help me to get exercise?  Yard work, such as: ? Pushing a lawn mower. ? Raking and bagging leaves.  Washing and waxing your car.  Pushing a stroller.  Shoveling snow.  Gardening.  Washing windows or floors. How can I be more active in my day-to-day activities?  Use the stairs instead of the elevator.  Take a walk during your lunch break.  If you drive, park your car farther away from work or school.  If you take public transportation, get off one stop early and walk the rest of the way.  Make all of your phone calls while standing up and walking around.  Get up, stretch, and walk around every 30 minutes throughout the day. What guidelines should I follow while exercising?  Do not exercise so much that you hurt yourself, feel dizzy, or get very short of breath.  Consult your health care provider before starting a new exercise program.  Wear comfortable clothes and shoes with good support.  Drink plenty of water while you exercise to prevent dehydration or heat stroke. Body water is lost during exercise and must be replaced.  Work out until you breathe faster and your heart beats faster. This information is not   intended to replace advice given to you by your health care provider. Make sure you discuss any questions you have with your health care provider. Document Released: 08/18/2010 Document Revised: 12/22/2015 Document Reviewed: 12/17/2013 Elsevier Interactive Patient Education  2018  Elsevier Inc.  

## 2017-08-16 NOTE — Progress Notes (Signed)
   Subjective:    Patient ID: Karen Burton, female    DOB: 1975/09/07, 42 y.o.   MRN: [id number]  HPI Patient is here today to follow up on Htn. She states she does eat healthy and does not get any exercise.She is taking Norvasc 5 mg daily and Hctz 12.5mg  one daily.   Blood pressure medicine and blood pressure levels reviewed today with patient. Compliant with blood pressure medicine. States does not miss a dose. No obvious side effects. Blood pressure generally good when checked elsewhere. Watching salt intake.   She sees Dr.Deveshrar Rheumatologist .She does not have any concerns today.    Not exrcising much at this point, considering kick boxing     Review of Systems No headache, no major weight loss or weight gain, no chest pain no back pain abdominal pain no change in bowel habits complete ROS otherwise negative     Objective:   Physical Exam  Alert vitals stable, NAD. Blood pressure good on repeat. HEENT normal. Lungs clear. Heart regular rate and rhythm.   Impression    Assessment & Plan:  Hypertension.  Good control.  Discussed.  Compliance with medication discussed.  Diet exercise discussed.  Meds refilled.  Recheck in 6 months continues to see specialist for chronic rheumatoid arthritis

## 2017-09-06 ENCOUNTER — Ambulatory Visit: Payer: BLUE CROSS/BLUE SHIELD | Admitting: Rheumatology

## 2017-09-09 NOTE — Progress Notes (Signed)
Office Visit Note  Patient: Karen Burton             Date of Birth: 22-Dec-1975           MRN: [id number]             PCP: Mikey Kirschner, MD Referring: Mikey Kirschner, MD Visit Date: 09/23/2017 Occupation: @GUAROCC @    Subjective:  Pain in multiple joints    History of Present Illness: Karen Burton is a 42 y.o. female with history of seropositive rheumatoid arthritis and fibromyalgia.  Patient states she is having increased pain in her hands, feet, and knees.  She states her hands, feet, ankles and knees have been swelling.  She states that her stiffness has been lasting all day.  She states that her right wrist has been popping and tender.  Her left thumb has also been very tender.  She states that she used to wear carpal tunnel braces on her bilateral wrists at night, which helped with her pain and swelling.  She reports numbness in her right 4th and 5th digits at night.  She has not been wearing the braces because they are old and worn down.  She states she has a new job that is not as physically demanding, yet she has been having more pain and stiffness.  She has been on Simponi injectable for about 3 years.   She reports her fibromyalgia causes pain in her mid back and trapezius region.  She states that trapezius trigger point injections helped with her muscle spasms and headaches.  She did get dimpling in this area, which as reversed.  She continues to have fatigue.  Her insomnia has improved.    Activities of Daily Living:  Patient reports morning stiffness for  all day.   Patient Denies nocturnal pain.  Difficulty dressing/grooming: Denies Difficulty climbing stairs: Denies Difficulty getting out of chair: Denies Difficulty using hands for taps, buttons, cutlery, and/or writing: Denies   Review of Systems  Constitutional: Positive for fatigue. Negative for weakness.  HENT: Positive for nose dryness ([place] ). Negative for mouth sores and mouth dryness.   Eyes:  Positive for dryness. Negative for pain, redness and visual disturbance.  Respiratory: Negative for cough, hemoptysis, shortness of breath and difficulty breathing.   Cardiovascular: Positive for hypertension. Negative for chest pain, palpitations, irregular heartbeat and swelling in legs/feet.  Gastrointestinal: Positive for constipation (takes stool softener). Negative for blood in stool and diarrhea.  Endocrine: Negative for increased urination.  Genitourinary: Negative for painful urination.  Musculoskeletal: Positive for arthralgias, joint pain, joint swelling, morning stiffness and muscle tenderness. Negative for myalgias, muscle weakness and myalgias.  Skin: Negative for color change, rash, hair loss, nodules/bumps, redness, skin tightness, ulcers and sensitivity to sunlight.  Allergic/Immunologic: Negative for susceptible to infections.  Neurological: Negative for dizziness, numbness and headaches.  Hematological: Negative for swollen glands.  Psychiatric/Behavioral: Positive for sleep disturbance. Negative for depressed mood. The patient is not nervous/anxious.     PMFS History:  Patient Active Problem List   Diagnosis Date Noted  . Encounter for surveillance of contraceptive pills 08/12/2017  . Encounter for well woman exam with routine gynecological exam 08/12/2017  . ANA positive 11/06/2016  . Fibromyalgia 11/06/2016  . Menorrhagia with regular cycle 12/20/2015  . Dysmenorrhea 12/20/2015  . Uterine leiomyoma 12/20/2015  . Vulvar itching 12/20/2015  . Anemia 12/20/2015  . Essential hypertension 01/12/2013  . Esophageal reflux 01/12/2013  . Rheumatoid arthritis ([place]) 01/12/2013  .  Rash and nonspecific skin eruption 01/12/2013    Past Medical History:  Diagnosis Date  . Anemia   . Arthritis   . Dysmenorrhea 12/20/2015  . Eczema    mild  . Fibroids 12/20/2015  . Fibromyalgia   . Menorrhagia with regular cycle 12/20/2015  . Rash   . Reflux   . Rhinitis    chonic  .  Vitamin D deficiency   . Vulvar itching 12/20/2015    Family History  Problem Relation Age of Onset  . Diabetes Mother   . Hypertension Mother   . Asthma Mother   . Cancer Mother        breast  . Thyroid disease Mother   . Arthritis Mother   . Syriah Delisi' disease Mother   . Cancer Maternal Grandmother        pancreatic   Past Surgical History:  Procedure Laterality Date  . ESOPHAGOGASTRODUODENOSCOPY ENDOSCOPY    . IR GENERIC HISTORICAL  02/07/2016   IR RADIOLOGIST EVAL & MGMT 02/07/2016 [name], MD [facility] INTERV RAD  . WISDOM TOOTH EXTRACTION     Social History   Social History Narrative  . Not on file     Objective: Vital Signs: BP (!) 140/93 (BP Location: Left Arm, Patient Position: Sitting, Cuff Size: Normal)   Pulse 84   Resp 17   Ht 5\' 5"  (1.651 m)   Wt 174 lb (78.9 kg)   BMI 28.96 kg/m    Physical Exam  Constitutional: She is oriented to person, place, and time. She appears well-developed and well-nourished.  HENT:  Head: Normocephalic and atraumatic.  Eyes: Conjunctivae and EOM are normal.  Neck: Normal range of motion.  Cardiovascular: Normal rate, regular rhythm, normal heart sounds and intact distal pulses.  Pulmonary/Chest: Effort normal and breath sounds normal.  Abdominal: Soft. Bowel sounds are normal.  Lymphadenopathy:    She has no cervical adenopathy.  Neurological: She is alert and oriented to person, place, and time.  Skin: Skin is warm and dry. Capillary refill takes less than 2 seconds.  Psychiatric: She has a normal mood and affect. Her behavior is normal.  Nursing note and vitals reviewed.    Musculoskeletal Exam: C-spine, thoracic, and lumbar spine good ROM.  No midline spinal tenderness.  No SI joint tenderness. Shoulder joints, elbow joints, wrist joints, MCPs, PIPs, and DIPs good ROM with no synovitis.  She has tenderness of her left 1st PIP.  Hip joints, knee joints, ankle joints, MTPs, PIPs, and DIPs good ROM with no synovitis.  She has  bilateral knee crepitus.  No warmth or effusion.  She has discomfort with left hip ROM.  No warmth or effusion of ankles. No trochanteric bursa tenderness.    CDAI Exam: CDAI Homunculus Exam:   Joint Counts:  CDAI Tender Joint count: 0 CDAI Swollen Joint count: 0  Global Assessments:  Patient Global Assessment: 8   CDAI Calculated Score: 8    Investigation: No additional findings.TB Gold: 07/12/2017 Negative  CBC Latest Ref Rng & Units 07/12/2017 02/27/2017 09/12/2016  WBC 3.8 - 10.8 Thousand/uL 5.0 4.5 4.0  Hemoglobin 11.7 - 15.5 g/dL 12.7 13.3 12.8  Hematocrit 35.0 - 45.0 % 37.1 40.4 38.9  Platelets 140 - 400 Thousand/uL 248 275 197   CMP Latest Ref Rng & Units 07/12/2017 02/27/2017 09/12/2016  Glucose 65 - 139 mg/dL 103 78 83  BUN 7 - 25 mg/dL 11 10 11   Creatinine 0.50 - 1.10 mg/dL 0.84 0.87 0.87  Sodium 135 - 146  mmol/L 140 138 140  Potassium 3.5 - 5.3 mmol/L 3.5 4.0 3.8  Chloride 98 - 110 mmol/L 104 102 105  CO2 20 - 32 mmol/L 30 27 27   Calcium 8.6 - 10.2 mg/dL 8.9 8.8 9.4  Total Protein 6.1 - 8.1 g/dL 6.6 6.7 6.6  Total Bilirubin 0.2 - 1.2 mg/dL 0.3 0.3 0.4  Alkaline Phos 33 - 115 U/L - 49 56  AST 10 - 30 U/L 29 23 24   ALT 6 - 29 U/L 27 26 19     Imaging: No results found.  Speciality Comments: No specialty comments available.    Procedures:  No procedures performed Allergies: Ceftin [cefuroxime axetil]; Humira [adalimumab]; and Methotrexate derivatives   Assessment / Plan:     Visit Diagnoses: Rheumatoid arthritis involving multiple sites with positive rheumatoid factor (HCC) - Positive RF, negative anti-CCP, positive ANA: She has no synovitis on exam.  She is on Simponi once a month.  She is due for her next injection on September 27, 2017. She reports she has been experiencing breakthrough pain 3 weeks after her injections.  She is going to be started on Arava 10 mg by mouth daily.  She will have labs rechecked in 2 weeks, if her labs are normal she will increase her  dose to 20 mg daily.  Recheck labs 2 weeks after increasing her dose to 20 mg.  She will then have labs in 2 month and then every 3 months once stable.  We discussed the indications, contraindications, and side effects.  All questions were addressed.  Consent was obtained today in the office.  A prescription was sent to the pharmacy.   Medication counseling:  TB Gold: 07/10/17  Pregnancy status:  She is on Junel Fe 24  Patient was counseled on the purpose, proper use, and adverse effects of leflunomide including risk of infection, nausea/diarrhea/weight loss, increase in blood pressure, rash, hair loss, tingling in the hands and feet, and signs and symptoms of interstitial lung disease.  Discussed the importance of frequent monitoring of liver function and blood counts, and patient was provided with instructions for standing labs.  Discussed importance of birth control while on leflunomide due to risk of congenital abnormalities, and patient confirms she is on OCPs.  Provided patient with educational materials on leflunomide and answered all questions.  Patient consented to [place] use, and consent will be uploaded into the media tab.     High risk medication use - Simponi inj once a month. TB gold 07/12/17. CBC/CMP 07/12/17.  Standing orders are in.  She will have labs in 2 weeks x2 and then 2 months, and then every 3 months.   Fibromyalgia: She is having generalized pain and muscle tension.  She has muscle tenderness and muscle tension in the trapezius region.    Other fatigue: She continues to have chronic fatigue.   History of insomnia: Her insomnia has improved.  Good sleep hygiene was discussed.   Other medical conditions are listed as follows:   History of gastroesophageal reflux (GERD)  History of hypertension  Menorrhagia with regular cycle - Chronic anemia due to excessive bleeding      Orders: No orders of the defined types were placed in this encounter.  No orders of the  defined types were placed in this encounter.   Face-to-face time spent with patient was 30 minutes. 50% of time was spent in counseling and coordination of care.  Follow-Up Instructions: Return in about 5 months (around 02/20/2018) for Rheumatoid arthritis, Fibromyalgia.  Ofilia Neas, PA-C  Note - This record has been created using Dragon software.  Chart creation errors have been sought, but may not always  have been located. Such creation errors do not reflect on  the standard of medical care.

## 2017-09-17 ENCOUNTER — Encounter: Payer: Self-pay | Admitting: Rheumatology

## 2017-09-23 ENCOUNTER — Encounter: Payer: Self-pay | Admitting: Physician Assistant

## 2017-09-23 ENCOUNTER — Ambulatory Visit: Payer: BLUE CROSS/BLUE SHIELD | Admitting: Physician Assistant

## 2017-09-23 VITALS — BP 140/93 | HR 84 | Resp 17 | Ht 65.0 in | Wt 174.0 lb

## 2017-09-23 DIAGNOSIS — N920 Excessive and frequent menstruation with regular cycle: Secondary | ICD-10-CM

## 2017-09-23 DIAGNOSIS — Z87898 Personal history of other specified conditions: Secondary | ICD-10-CM | POA: Diagnosis not present

## 2017-09-23 DIAGNOSIS — M797 Fibromyalgia: Secondary | ICD-10-CM

## 2017-09-23 DIAGNOSIS — M0579 Rheumatoid arthritis with rheumatoid factor of multiple sites without organ or systems involvement: Secondary | ICD-10-CM | POA: Diagnosis not present

## 2017-09-23 DIAGNOSIS — R5383 Other fatigue: Secondary | ICD-10-CM

## 2017-09-23 DIAGNOSIS — Z8719 Personal history of other diseases of the digestive system: Secondary | ICD-10-CM | POA: Diagnosis not present

## 2017-09-23 DIAGNOSIS — Z8679 Personal history of other diseases of the circulatory system: Secondary | ICD-10-CM

## 2017-09-23 DIAGNOSIS — Z79899 Other long term (current) drug therapy: Secondary | ICD-10-CM

## 2017-09-23 MED ORDER — LEFLUNOMIDE 10 MG PO TABS: ORAL_TABLET | ORAL | 2 refills | Status: DC

## 2017-09-23 NOTE — Patient Instructions (Addendum)
Standing Labs We placed an order today for your standing lab work.    Please come back and get your standing labs in 2 weeks x2, then 2 months, and then every 3 months  We have open lab Monday through Friday from 8:30-11:30 AM and 1:30-4 PM at the office of Dr. Bo Merino.   The office is located at [address], [place], [place], [place] [address] No appointment is necessary.   Labs are drawn by [facility].  You may receive a bill from [facility] for your lab work. If you have any questions regarding directions or hours of operation,  please call [phone].     Leflunomide tablets What is this medicine? LEFLUNOMIDE (le FLOO na mide) is for rheumatoid arthritis. This medicine may be used for other purposes; ask your health care provider or pharmacist if you have questions. COMMON BRAND NAME(S): Arava What should I tell my health care provider before I take this medicine? They need to know if you have any of these conditions: -alcoholism -bone marrow problems -fever or infection -immune system problems -kidney disease -liver disease -an unusual or allergic reaction to leflunomide, teriflunomide, other medicines, lactose, foods, dyes, or preservatives -pregnant or trying to get pregnant -breast-feeding How should I use this medicine? Take this medicine by mouth with a full glass of water. Follow the directions on the prescription label. Take your medicine at regular intervals. Do not take your medicine more often than directed. Do not stop taking except on your doctor's advice. Talk to your pediatrician regarding the use of this medicine in children. Special care may be needed. Overdosage: If you think you have taken too much of this medicine contact a poison control center or emergency room at once. NOTE: This medicine is only for you. Do not share this medicine with others. What if I miss a dose? If you miss a dose, take it as soon as you can. If it is almost time for your  next dose, take only that dose. Do not take double or extra doses. What may interact with this medicine? Do not take this medicine with any of the following medications: -teriflunomide This medicine may also interact with the following medications: -charcoal -cholestyramine -methotrexate -NSAIDs, medicines for pain and inflammation, like ibuprofen or naproxen -phenytoin -rifampin -tolbutamide -vaccines -warfarin This list may not describe all possible interactions. Give your health care provider a list of all the medicines, herbs, non-prescription drugs, or dietary supplements you use. Also tell them if you smoke, drink alcohol, or use illegal drugs. Some items may interact with your medicine. What should I watch for while using this medicine? Visit your doctor or health care professional for regular checks on your progress. You will need frequent blood checks while you are receiving the medicine. If you get a cold or other infection while receiving this medicine, call your doctor or health care professional. Do not treat yourself. The medicine may increase your risk of getting an infection. If you are a woman who has the potential to become pregnant, discuss birth control options with your doctor or health care professional. Dennis Bast must not be pregnant, and you must be using a reliable form of birth control. The medicine may harm an unborn baby. Immediately call your doctor if you think you might be pregnant. Alcoholic drinks may increase possible damage to your liver. Do not drink alcohol while taking this medicine. What side effects may I notice from receiving this medicine? Side effects that you should report to  your doctor or health care professional as soon as possible: -allergic reactions like skin rash, itching or hives, swelling of the face, lips, or tongue -cough -difficulty breathing or shortness of breath -fever, chills or any other sign of infection -redness, blistering, peeling  or loosening of the skin, including inside the mouth -unusual bleeding or bruising -unusually weak or tired -vomiting -yellowing of eyes or skin Side effects that usually do not require medical attention (report to your doctor or health care professional if they continue or are bothersome): -diarrhea -hair loss -headache -nausea This list may not describe all possible side effects. Call your doctor for medical advice about side effects. You may report side effects to FDA at 1-800-FDA-1088. Where should I keep my medicine? Keep out of the reach of children. Store at room temperature between 15 and 30 degrees C (59 and 86 degrees F). Protect from moisture and light. Throw away any unused medicine after the expiration date. NOTE: This sheet is a summary. It may not cover all possible information. If you have questions about this medicine, talk to your doctor, pharmacist, or health care provider.  2018 Elsevier/Gold Standard (2013-07-14 10:53:11)

## 2017-10-11 ENCOUNTER — Other Ambulatory Visit: Payer: Self-pay | Admitting: Rheumatology

## 2017-10-13 ENCOUNTER — Encounter: Payer: Self-pay | Admitting: Rheumatology

## 2017-10-14 ENCOUNTER — Other Ambulatory Visit: Payer: Self-pay | Admitting: Rheumatology

## 2017-10-14 MED ORDER — GOLIMUMAB 50 MG/0.5ML ~~LOC~~ SOAJ: 50.0000 mg | SUBCUTANEOUS | 0 refills | Status: DC

## 2017-10-14 NOTE — Telephone Encounter (Signed)
Last visit: 09/23/17 Next Visit: 02/19/18  Okay to refill per Dr. Estanislado Pandy

## 2017-10-14 NOTE — Telephone Encounter (Addendum)
Last visit: 09/23/17 Next Visit: 02/19/18 Labs: 07/12/17 WNL TB Gold: 07/12/17 Neg   Left message to advise patient she is due to update labs.   Okay to refill 30 day supply per Dr. Estanislado Pandy

## 2017-10-14 NOTE — Telephone Encounter (Signed)
Cymbalta was given for the treatment of fibromyalgia.  I usually do not treat fibromyalgia.  She should contact her PCP for other treatment options.

## 2017-10-15 NOTE — Telephone Encounter (Signed)
I had detailed discussion with the patient.  She did not have any synovitis on examination during her last visit.  As Karen Burton is expensive I have advised her to discontinue Arava.  She will continue Simponi for now.  She states that Cymbalta has been effective in controlling her fibromyalgia symptoms.  She has been having some discomfort in her thumb.  I have advised her to use Tylenol.

## 2017-11-01 ENCOUNTER — Encounter: Payer: Self-pay | Admitting: Rheumatology

## 2017-11-01 ENCOUNTER — Telehealth: Payer: Self-pay | Admitting: Rheumatology

## 2017-11-01 DIAGNOSIS — Z79899 Other long term (current) drug therapy: Secondary | ICD-10-CM | POA: Diagnosis not present

## 2017-11-01 NOTE — Telephone Encounter (Signed)
Lab orders have been faxed. Patient has been advised.

## 2017-11-01 NOTE — Telephone Encounter (Signed)
Patient called stating that she would like to get her bloodwork done today during lunch.  Patient works at [employer] and there is a [place] at the office.  Please fax orders to [phone]

## 2017-11-02 LAB — CBC WITH DIFFERENTIAL/PLATELET
Basophils Absolute: 0 10*3/uL (ref 0.0–0.2)
Basos: 0 %
EOS (ABSOLUTE): 0.1 10*3/uL (ref 0.0–0.4)
Eos: 2 %
Hematocrit: 37.9 % (ref 34.0–46.6)
Hemoglobin: 12.9 g/dL (ref 11.1–15.9)
Immature Grans (Abs): 0 10*3/uL (ref 0.0–0.1)
Immature Granulocytes: 0 %
Lymphocytes Absolute: 1.2 10*3/uL (ref 0.7–3.1)
Lymphs: 25 %
MCH: 29.9 pg (ref 26.6–33.0)
MCHC: 34 g/dL (ref 31.5–35.7)
MCV: 88 fL (ref 79–97)
Monocytes Absolute: 0.6 10*3/uL (ref 0.1–0.9)
Monocytes: 13 %
Neutrophils Absolute: 2.7 10*3/uL (ref 1.4–7.0)
Neutrophils: 60 %
Platelets: 228 10*3/uL (ref 150–379)
RBC: 4.31 x10E6/uL (ref 3.77–5.28)
RDW: 13.6 % (ref 12.3–15.4)
WBC: 4.6 10*3/uL (ref 3.4–10.8)

## 2017-11-02 LAB — CMP14+EGFR
A/G RATIO: 1.4 (ref 1.2–2.2)
ALBUMIN: 3.9 g/dL (ref 3.5–5.5)
ALK PHOS: 52 IU/L (ref 39–117)
ALT: 31 IU/L (ref 0–32)
AST: 33 IU/L (ref 0–40)
BUN / CREAT RATIO: 11 (ref 9–23)
BUN: 9 mg/dL (ref 6–24)
CHLORIDE: 102 mmol/L (ref 96–106)
CO2: 27 mmol/L (ref 20–29)
Calcium: 9.3 mg/dL (ref 8.7–10.2)
Creatinine, Ser: 0.8 mg/dL (ref 0.57–1.00)
GFR calc Af Amer: 105 mL/min/{1.73_m2} (ref >59)
GFR calc non Af Amer: 91 mL/min/{1.73_m2} (ref >59)
GLOBULIN, TOTAL: 2.8 g/dL (ref 1.5–4.5)
GLUCOSE: 66 mg/dL (ref 65–99)
POTASSIUM: 3.8 mmol/L (ref 3.5–5.2)
SODIUM: 142 mmol/L (ref 134–144)
Total Protein: 6.7 g/dL (ref 6.0–8.5)

## 2017-11-11 ENCOUNTER — Other Ambulatory Visit: Payer: Self-pay | Admitting: Rheumatology

## 2017-11-11 ENCOUNTER — Encounter: Payer: Self-pay | Admitting: Rheumatology

## 2017-11-11 MED ORDER — DULOXETINE HCL 60 MG PO CPEP: 60.0000 mg | ORAL_CAPSULE | Freq: Every day | ORAL | 2 refills | Status: DC

## 2017-11-11 NOTE — Telephone Encounter (Signed)
Last visit: 09/23/17 Next Visit: 02/19/18  Okay to refill per Dr. Estanislado Pandy.

## 2017-11-12 MED ORDER — GOLIMUMAB 50 MG/0.5ML ~~LOC~~ SOAJ: 50.0000 mg | SUBCUTANEOUS | 0 refills | Status: DC

## 2017-11-12 NOTE — Telephone Encounter (Signed)
Last visit: 09/23/17 Next Visit: 02/19/18 Labs: 11/01/17 WNL TB Gold: 07/12/17 Neg   Okay to refill per Dr. Estanislado Pandy

## 2017-11-15 ENCOUNTER — Telehealth: Payer: Self-pay

## 2017-11-15 NOTE — Telephone Encounter (Signed)
Received a prior authorization request for Simponi from pharmacy. Authorization has been submitted to pts insurance via cover my meds. Will update once we have a response.   Chanceler Pullin, [place], CPhT 3:47 PM

## 2017-11-18 ENCOUNTER — Telehealth: Payer: Self-pay | Admitting: Rheumatology

## 2017-11-18 NOTE — Telephone Encounter (Signed)
Prior authorization was submitted via cover my meds on 11/15/17 (See previous telephone note). Called [employer] to check the status of PA. Spoke with Aldona Bar who states that the authorization is still pending. Normal turnaround time is 24 to 72 hours. We should have a response by today or tomorrow.   Phone: [phone] Reference number: [id number]  Called pt to update. Left message.  Gabby Rackers, [place], CPhT 2:02 PM

## 2017-11-18 NOTE — Telephone Encounter (Signed)
Per patient [facility] pharmacy is sending over prior auth for patients Simponi due to insurance change. Patient request that be sent in ASAP since she is due for injection.

## 2017-11-19 ENCOUNTER — Telehealth: Payer: Self-pay | Admitting: Pharmacist

## 2017-11-19 NOTE — Telephone Encounter (Signed)
Called patient to schedule an appointment for the  [facility]. I was unable to reach the patient so I left a HIPAA-compliant message requesting that the patient return my call.   

## 2017-11-19 NOTE — Telephone Encounter (Signed)
Called [employer] to check status of pts PA for Simponi. Spoke with Nilda Simmer who states that the authorization is still being processed. Normal turnaround time is 24 to 72 BUSINESS hours. We may have a response tomorrow.   Called pt to update. Left message. Will update once we have a response.   Jayesh Marbach, [place], CPhT 3:30 PM

## 2017-11-20 ENCOUNTER — Telehealth: Payer: Self-pay | Admitting: Pharmacist

## 2017-11-20 NOTE — Telephone Encounter (Signed)
Called patient to schedule an appointment for the [place] [facility]. I was unable to reach the patient so I left a HIPAA-compliant message requesting that the patient return my call.   

## 2017-11-21 NOTE — Telephone Encounter (Signed)
Received a fax from [facility] asking if pt has experienced or maintained a 20% or greater improvement in tender joint count or swollen joint count while on therapy. After reviewing pts profile I called the secure automated line and answered yes. Will update once we have a response.   Phone: [phone] Reference number: [id number]  Will send document to scan center  Demetrios Loll, CPhT 8:43 AM

## 2017-11-22 NOTE — Telephone Encounter (Signed)
Called insurance to check the status of pts authorization. Spoke with Jess who states that they received information from the clinic and it was updated in pts profile today. It is pending a response. It usually take 24 to 72 hours.   Called pt to update. She states that she has not had an injection for the month of April. Per Seth Bake, LPN pt can pick up a sample from the clinic. Patient will try to come on Monday or send someone to get one for her.   Will update once we have a response from insurance.   Janina Trafton 4:21 PM

## 2017-11-25 ENCOUNTER — Telehealth: Payer: Self-pay

## 2017-11-25 NOTE — Telephone Encounter (Signed)
Received voicemail from Friday night, patient was returning my call. Called patient back and appt scheduled for tomorrow at 5:30 pm

## 2017-11-25 NOTE — Telephone Encounter (Signed)
Called pts insurance to check the status of authorization. Spoke with Jess who states that the authorization is pending. We should have a response within 24 to 72 hours. Will update once we have a response.  Patient came in today to get a sample.  Reference number: [id number]  Demetrios Loll, CPhT 1:41 PM

## 2017-11-26 ENCOUNTER — Ambulatory Visit (INDEPENDENT_AMBULATORY_CARE_PROVIDER_SITE_OTHER): Payer: BLUE CROSS/BLUE SHIELD | Admitting: Pharmacist

## 2017-11-26 DIAGNOSIS — Z79899 Other long term (current) drug therapy: Secondary | ICD-10-CM

## 2017-11-26 MED ORDER — GOLIMUMAB 50 MG/0.5ML ~~LOC~~ SOAJ: 50.0000 mg | SUBCUTANEOUS | 1 refills | Status: DC

## 2017-11-26 NOTE — Progress Notes (Signed)
   S: Patient presents to [facility] for review of their specialty medication therapy.  Patient is currently taking Simponi for rheumatoid arthritis. Patient is managed by Dr. Estanislado Pandy for this.   She was on Humira in the past but the injections were very painful so she was switched to Simponi. She has had no issues taking the Simponi.  Adherence: denies any missed doses  Efficacy: reports that overall it works well for her but she had breakthrough pain on it and was prescribed [place]. She did not start it due to the high copay and concern about side effects. She is wondering if her pain is fibromyalgia related.   Dosing:  Rheumatoid arthritis: SubQ: 50 mg once a month (in combination with methotrexate)  Dose adjustments: Renal: no dose adjustments (has not been studied) Hepatic: no dose adjustments (has not been studied)  Screening: TB test: completed December 2018 Hepatitis: completed per patient  Monitoring: S/sx of infection: denies CBC: see below, regularly monitored S/sx of hypersensitivity: denies S/sx of malignancy: denies S/sx of heart failure: denies S/sx of autoimmune disorder: denies   O:     Lab Results  Component Value Date   WBC 4.6 11/01/2017   HGB 12.9 11/01/2017   HCT 37.9 11/01/2017   MCV 88 11/01/2017   PLT 228 11/01/2017      Chemistry      Component Value Date/Time   NA 142 11/01/2017 1247   K 3.8 11/01/2017 1247   CL 102 11/01/2017 1247   CO2 27 11/01/2017 1247   BUN 9 11/01/2017 1247   CREATININE 0.80 11/01/2017 1247   CREATININE 0.84 07/12/2017 1346      Component Value Date/Time   CALCIUM 9.3 11/01/2017 1247   ALKPHOS 52 11/01/2017 1247   AST 33 11/01/2017 1247   ALT 31 11/01/2017 1247   BILITOT <0.2 11/01/2017 1247       A/P: 1. Medication review: Patient currently on Simponi for rheumatoid arthritis. Reviewed the medication with the patient, including the following: Simponi, golimumab, is a TNF? blocker.  Patient  educated on purpose, proper use and potential adverse effects of Simponi. There is an increased risk of infection and malignancy with this medication. Do not give patients live vaccinations while they are on this medication. No recommendations for changes. Patient to follow up with Dr. Estanislado Pandy regarding continued pain and decision to not start [place].     Christella Hartigan, PharmD, BCPS, BCACP, CPP Clinical Pharmacist Practitioner  [phone]

## 2017-11-27 MED FILL — SIMPONI 50 MG/0.5ML SOAJ: 50 | 28 days supply | Qty: 1 | Fill #0

## 2017-11-28 NOTE — Telephone Encounter (Signed)
Received a fax from [facility] regarding a prior authorization approval for [place] from 11/15/2017 to 11/15/2018.   Reference [id number] Phone [phone]  Will send document to scan center.  Called pt to update. Left message.  Nevea Spiewak, [place], CPhT 8:14 AM

## 2017-12-30 ENCOUNTER — Encounter: Payer: Self-pay | Admitting: Rheumatology

## 2017-12-31 NOTE — Telephone Encounter (Signed)
Attempted to contact patient and left message for patient to call the office. When patient calls office will offer appointment.

## 2018-01-03 NOTE — Progress Notes (Signed)
Office Visit Note  Patient: Karen Burton             Date of Birth: 06-07-1976           MRN: [id number]             PCP: Mikey Kirschner, MD Referring: Mikey Kirschner, MD Visit Date: 01/16/2018 Occupation: @GUAROCC @    Subjective:  Bilateral hand pain   History of Present Illness: Karen Burton is a 42 y.o. female with history of seropositive rheumatoid arthritis and fibromyalgia.  Her last Simponi subcutaneous injection was on 11/28/2017.  She reports that she never started on Arava due to the cost and her insurance not covering it.  She reports that she has a new insurance but has not tried to see how much therapy would be.  She reports that in the past she has tried Humira as well as methotrexate.  Methotrexate caused leukopenia and we discontinued it.  She reports that she has been having pain in bilateral hands and her right ankle.  She reports that her fibromyalgia has also been flaring.  She states that she went for massage in March and developed increased pain afterwards.  She reports that her fatigue and insomnia have been worsening.  She has been waking up feeling unrested.  She states that she is having tenderness in the trapezius region bilaterally.  She states she has cortisone injections in the past that provided temporary relief but would not like a cortisone injection today.   Activities of Daily Living:  Patient reports morning stiffness for a few hours.   Patient Reports nocturnal pain.  Difficulty dressing/grooming: Denies Difficulty climbing stairs: Reports Difficulty getting out of chair: Denies Difficulty using hands for taps, buttons, cutlery, and/or writing: Reports   Review of Systems  Constitutional: Positive for fatigue.  HENT: Negative for mouth sores and mouth dryness.   Eyes: Positive for dryness. Negative for pain and visual disturbance.  Respiratory: Negative for cough, hemoptysis, shortness of breath and difficulty breathing.     Cardiovascular: Negative for chest pain, palpitations, hypertension and swelling in legs/feet.  Gastrointestinal: Positive for constipation and diarrhea. Negative for abdominal pain and blood in stool.  Endocrine: Negative for increased urination.  Genitourinary: Negative for painful urination and pelvic pain.  Musculoskeletal: Positive for arthralgias, joint pain, joint swelling and morning stiffness. Negative for myalgias, muscle weakness, muscle tenderness and myalgias.  Skin: Negative for color change, pallor, rash, hair loss, nodules/bumps, skin tightness, ulcers and sensitivity to sunlight.  Allergic/Immunologic: Negative for susceptible to infections.  Neurological: Negative for dizziness, light-headedness, numbness, headaches and memory loss.  Hematological: Negative for swollen glands.  Psychiatric/Behavioral: Negative for depressed mood, confusion and sleep disturbance. The patient is not nervous/anxious.     PMFS History:  Patient Active Problem List   Diagnosis Date Noted  . Encounter for surveillance of contraceptive pills 08/12/2017  . Encounter for well woman exam with routine gynecological exam 08/12/2017  . ANA positive 11/06/2016  . Fibromyalgia 11/06/2016  . Menorrhagia with regular cycle 12/20/2015  . Dysmenorrhea 12/20/2015  . Uterine leiomyoma 12/20/2015  . Vulvar itching 12/20/2015  . Anemia 12/20/2015  . Essential hypertension 01/12/2013  . Esophageal reflux 01/12/2013  . Rheumatoid arthritis ([place]) 01/12/2013  . Rash and nonspecific skin eruption 01/12/2013    Past Medical History:  Diagnosis Date  . Anemia   . Arthritis   . Dysmenorrhea 12/20/2015  . Eczema    mild  . Fibroids 12/20/2015  .  Fibromyalgia   . Menorrhagia with regular cycle 12/20/2015  . Rash   . Reflux   . Rhinitis    chonic  . Vitamin D deficiency   . Vulvar itching 12/20/2015    Family History  Problem Relation Age of Onset  . Diabetes Mother   . Hypertension Mother   . Asthma  Mother   . Cancer Mother        breast  . Thyroid disease Mother   . Arthritis Mother   . Graves' disease Mother   . Cancer Maternal Grandmother        pancreatic   Past Surgical History:  Procedure Laterality Date  . ESOPHAGOGASTRODUODENOSCOPY ENDOSCOPY    . IR GENERIC HISTORICAL  02/07/2016   IR RADIOLOGIST EVAL & MGMT 02/07/2016 [name], MD [facility] INTERV RAD  . WISDOM TOOTH EXTRACTION     Social History   Social History Narrative  . Not on file     Objective: Vital Signs: BP (!) 145/98 (BP Location: Left Arm, Patient Position: Sitting, Cuff Size: Normal)   Pulse 89   Resp 15   Ht 5\' 5"  (1.651 m)   Wt 174 lb (78.9 kg)   BMI 28.96 kg/m    Physical Exam  Constitutional: She is oriented to person, place, and time. She appears well-developed and well-nourished.  HENT:  Head: Normocephalic and atraumatic.  Eyes: Conjunctivae and EOM are normal.  Neck: Normal range of motion.  Cardiovascular: Normal rate, regular rhythm, normal heart sounds and intact distal pulses.  Pulmonary/Chest: Effort normal and breath sounds normal.  Abdominal: Soft. Bowel sounds are normal.  Lymphadenopathy:    She has no cervical adenopathy.  Neurological: She is alert and oriented to person, place, and time.  Skin: Skin is warm and dry. Capillary refill takes less than 2 seconds.  Psychiatric: She has a normal mood and affect. Her behavior is normal.  Nursing note and vitals reviewed.    Musculoskeletal Exam: C-spine limited range of motion with crepitus.  Thoracic and lumbar spine good range of motion.  She has midline spinal tenderness in the thoracic region.  She has no SI joint tenderness.  Shoulder joints, elbow joints, wrist joints, MCPs, PIPs, DIPs good range of motion with no synovitis.  She has complete fist formation bilaterally.  She has tenderness of her right third MCP joint.  Hip joints, knee joints, ankle joints, MTPs, PIPs, DIPs good range of motion with no synovitis.  No warmth  or effusion of bilateral knee joints.  She has tenderness of bilateral trochanteric bursa.  CDAI Exam: CDAI Homunculus Exam:   Tenderness:  Right hand: 3rd PIP RLE: tibiotalar  Joint Counts:  CDAI Tender Joint count: 1 CDAI Swollen Joint count: 0  Global Assessments:  Patient Global Assessment: 6 Provider Global Assessment: 6  CDAI Calculated Score: 13    Investigation: No additional findings. CBC Latest Ref Rng & Units 11/01/2017 07/12/2017 02/27/2017  WBC 3.4 - 10.8 x10E3/uL 4.6 5.0 4.5  Hemoglobin 11.1 - 15.9 g/dL 12.9 12.7 13.3  Hematocrit 34.0 - 46.6 % 37.9 37.1 40.4  Platelets 150 - 379 x10E3/uL 228 248 275   CMP Latest Ref Rng & Units 11/01/2017 07/12/2017 02/27/2017  Glucose 65 - 99 mg/dL 66 103 78  BUN 6 - 24 mg/dL 9 11 10   Creatinine 0.57 - 1.00 mg/dL 0.80 0.84 0.87  Sodium 134 - 144 mmol/L 142 140 138  Potassium 3.5 - 5.2 mmol/L 3.8 3.5 4.0  Chloride 96 - 106 mmol/L 102  104 102  CO2 20 - 29 mmol/L 27 30 27   Calcium 8.7 - 10.2 mg/dL 9.3 8.9 8.8  Total Protein 6.0 - 8.5 g/dL 6.7 6.6 6.7  Total Bilirubin 0.0 - 1.2 mg/dL <0.2 0.3 0.3  Alkaline Phos 39 - 117 IU/L 52 - 49  AST 0 - 40 IU/L 33 29 23  ALT 0 - 32 IU/L 31 27 26      Imaging: No results found.  Speciality Comments: No specialty comments available.    Procedures:  No procedures performed Allergies: Ceftin [cefuroxime axetil]; Humira [adalimumab]; and Methotrexate derivatives   Assessment / Plan:     Visit Diagnoses: Rheumatoid arthritis involving multiple sites with positive rheumatoid factor (HCC) - Positive RF, negative anti-CCP, positive ANA: She has no synovitis on exam.  She has tenderness of her right third PIP joint.  No tenderness of MCPs or PIPs.  Her last [place] subcutaneous injection was on 11/28/2017.  After her last visit she never started on [place] due to the cost since her insurance did not cover it.  She recently has a new IT consultant and is going to try getting the refill filled  with this insurance company.  A new prescription was sent to the pharmacy today.  She will take Arava 10 mg by mouth once daily for 2 weeks if labs are stable she will increase to 20 mg daily.  She is advised to notify us if she continues to have joint pain or joint swelling.  We will see her back in 3 months to see how she is doing on [place] and Simponi combination.  High risk medication use - Simponi inj once a month. TB gold 07/12/17.  She will return for lab work in 2 weeks x 2, then 2 months, then every 3 months when she is stable.  Fibromyalgia: Her fibromyalgia has been flaring more frequently.  She has generalized muscle aches and muscle tenderness due to fibromyalgia.  She is generalized hyperalgesia on exam today.  She has tenderness in bilateral trapezius muscles.  She declined this today.  She also has tenderness of bilateral trochanteric bursa.  She has worsening insomnia and fatigue.  She has not been sleeping well due to the pain and has been waking up feeling unrested.  We discussed the importance of exercising on a regular basis.  Other fatigue: Chronic and related to insomnia.  She was advised to continue to stay active and exercise on a regular basis.  History of insomnia: She is been having worsening insomnia due to the level of pain that she has been in.  We discussed good sleep hygiene.  Other medical conditions are listed as follows:   History of gastroesophageal reflux (GERD)  History of hypertension    Orders: No orders of the defined types were placed in this encounter.  Meds ordered this encounter  Medications  . leflunomide (ARAVA) 10 MG tablet    Sig: Take 1 tablet by mouth daily for 2 weeks. If labs are stable in 2 weeks, take 2 tablets by mouth daily.    Dispense:  60 tablet    Refill:  2     Follow-Up Instructions: Return in about 3 months (around 04/18/2018) for Rheumatoid arthritis, Fibromyalgia.   Ofilia Neas, PA-C  Note - This record has been  created using Dragon software.  Chart creation errors have been sought, but may not always  have been located. Such creation errors do not reflect on  the standard of medical care.

## 2018-01-13 MED FILL — SIMPONI 50 MG/0.5ML SOAJ: 50 | 28 days supply | Qty: 1 | Fill #1

## 2018-01-16 ENCOUNTER — Encounter: Payer: Self-pay | Admitting: Physician Assistant

## 2018-01-16 ENCOUNTER — Ambulatory Visit: Payer: 59 | Admitting: Physician Assistant

## 2018-01-16 VITALS — BP 145/98 | HR 89 | Resp 15 | Ht 65.0 in | Wt 174.0 lb

## 2018-01-16 DIAGNOSIS — Z8719 Personal history of other diseases of the digestive system: Secondary | ICD-10-CM

## 2018-01-16 DIAGNOSIS — M0579 Rheumatoid arthritis with rheumatoid factor of multiple sites without organ or systems involvement: Secondary | ICD-10-CM

## 2018-01-16 DIAGNOSIS — Z79899 Other long term (current) drug therapy: Secondary | ICD-10-CM

## 2018-01-16 DIAGNOSIS — R5383 Other fatigue: Secondary | ICD-10-CM | POA: Diagnosis not present

## 2018-01-16 DIAGNOSIS — M797 Fibromyalgia: Secondary | ICD-10-CM

## 2018-01-16 DIAGNOSIS — Z8679 Personal history of other diseases of the circulatory system: Secondary | ICD-10-CM

## 2018-01-16 DIAGNOSIS — Z87898 Personal history of other specified conditions: Secondary | ICD-10-CM

## 2018-01-16 MED ORDER — LEFLUNOMIDE 10 MG PO TABS: ORAL_TABLET | ORAL | 2 refills | Status: DC

## 2018-01-16 NOTE — Patient Instructions (Signed)
Standing Labs We placed an order today for your standing lab work.    Please come back and get your standing labs in 2 weeks x2, then in 2 months, and every 3 months   We have open lab Monday through Friday from 8:30-11:30 AM and 1:30-4:00 PM  at the office of Dr. Bo Merino.   You may experience shorter wait times on Monday and Friday afternoons. The office is located at [address] [place], [place], [place] [address] No appointment is necessary.   Labs are drawn by [facility].  You may receive a bill from [facility] for your lab work. If you have any questions regarding directions or hours of operation,  please call [phone].

## 2018-02-04 ENCOUNTER — Other Ambulatory Visit: Payer: Self-pay | Admitting: Rheumatology

## 2018-02-04 NOTE — Telephone Encounter (Signed)
Last Visit: 01/16/18 Next Visit: 05/23/18 Labs: 11/01/17 WNL TB Gold: 07/12/17 Neg   Attempted to contact patient and left message for patient to call the office.

## 2018-02-05 ENCOUNTER — Other Ambulatory Visit: Payer: Self-pay | Admitting: *Deleted

## 2018-02-05 ENCOUNTER — Encounter: Payer: Self-pay | Admitting: Family Medicine

## 2018-02-05 ENCOUNTER — Encounter: Payer: Self-pay | Admitting: Rheumatology

## 2018-02-05 DIAGNOSIS — Z79899 Other long term (current) drug therapy: Secondary | ICD-10-CM

## 2018-02-06 DIAGNOSIS — Z79899 Other long term (current) drug therapy: Secondary | ICD-10-CM | POA: Diagnosis not present

## 2018-02-06 NOTE — Telephone Encounter (Signed)
Pt called  to discuss lab work can send information through my chart per pt

## 2018-02-07 ENCOUNTER — Other Ambulatory Visit: Payer: Self-pay | Admitting: Pharmacist

## 2018-02-07 ENCOUNTER — Encounter: Payer: Self-pay | Admitting: *Deleted

## 2018-02-07 ENCOUNTER — Ambulatory Visit: Payer: 59 | Admitting: Family Medicine

## 2018-02-07 ENCOUNTER — Encounter: Payer: Self-pay | Admitting: Family Medicine

## 2018-02-07 VITALS — BP 132/92 | Ht 65.0 in | Wt 173.4 lb

## 2018-02-07 DIAGNOSIS — J329 Chronic sinusitis, unspecified: Secondary | ICD-10-CM

## 2018-02-07 DIAGNOSIS — J310 Chronic rhinitis: Secondary | ICD-10-CM

## 2018-02-07 LAB — CBC WITH DIFFERENTIAL/PLATELET
BASOS ABS: 0 10*3/uL (ref 0.0–0.2)
Basos: 0 %
EOS (ABSOLUTE): 0.1 10*3/uL (ref 0.0–0.4)
EOS: 3 %
HEMATOCRIT: 40 % (ref 34.0–46.6)
HEMOGLOBIN: 13.1 g/dL (ref 11.1–15.9)
IMMATURE GRANS (ABS): 0 10*3/uL (ref 0.0–0.1)
Immature Granulocytes: 0 %
LYMPHS ABS: 1.7 10*3/uL (ref 0.7–3.1)
LYMPHS: 43 %
MCH: 30 pg (ref 26.6–33.0)
MCHC: 32.8 g/dL (ref 31.5–35.7)
MCV: 92 fL (ref 79–97)
MONOCYTES: 11 %
Monocytes Absolute: 0.4 10*3/uL (ref 0.1–0.9)
Neutrophils Absolute: 1.8 10*3/uL (ref 1.4–7.0)
Neutrophils: 43 %
Platelets: 226 10*3/uL (ref 150–450)
RBC: 4.37 x10E6/uL (ref 3.77–5.28)
RDW: 13.8 % (ref 12.3–15.4)
WBC: 4 10*3/uL (ref 3.4–10.8)

## 2018-02-07 LAB — CMP14+EGFR
A/G RATIO: 1.5 (ref 1.2–2.2)
ALBUMIN: 4.2 g/dL (ref 3.5–5.5)
ALK PHOS: 56 IU/L (ref 39–117)
ALT: 31 IU/L (ref 0–32)
AST: 32 IU/L (ref 0–40)
BUN/Creatinine Ratio: 12 (ref 9–23)
BUN: 9 mg/dL (ref 6–24)
CHLORIDE: 101 mmol/L (ref 96–106)
CO2: 25 mmol/L (ref 20–29)
Calcium: 9.3 mg/dL (ref 8.7–10.2)
Creatinine, Ser: 0.76 mg/dL (ref 0.57–1.00)
GFR calc Af Amer: 112 mL/min/{1.73_m2} (ref >59)
GFR calc non Af Amer: 97 mL/min/{1.73_m2} (ref >59)
GLUCOSE: 74 mg/dL (ref 65–99)
Globulin, Total: 2.8 g/dL (ref 1.5–4.5)
POTASSIUM: 4 mmol/L (ref 3.5–5.2)
Sodium: 139 mmol/L (ref 134–144)
TOTAL PROTEIN: 7 g/dL (ref 6.0–8.5)

## 2018-02-07 MED ORDER — AMLODIPINE BESYLATE 5 MG PO TABS: 5.0000 mg | ORAL_TABLET | Freq: Every day | ORAL | 5 refills | Status: DC

## 2018-02-07 MED ORDER — DOXYCYCLINE HYCLATE 100 MG PO TABS: 100.0000 mg | ORAL_TABLET | Freq: Two times a day (BID) | ORAL | 0 refills | Status: DC

## 2018-02-07 MED ORDER — GOLIMUMAB 50 MG/0.5ML ~~LOC~~ SOAJ: SUBCUTANEOUS | 0 refills | Status: DC

## 2018-02-07 MED ORDER — HYDROCHLOROTHIAZIDE 12.5 MG PO CAPS: 12.5000 mg | ORAL_CAPSULE | Freq: Every morning | ORAL | 5 refills | Status: DC

## 2018-02-07 MED ORDER — FLUCONAZOLE 150 MG PO TABS: ORAL_TABLET | ORAL | 0 refills | Status: DC

## 2018-02-07 NOTE — Progress Notes (Signed)
   Subjective:    Patient ID: Karen Burton, female    DOB: 03-17-1976, 42 y.o.   MRN: [id number]  Hypertension  This is a chronic problem. There are no compliance problems.    Pt now on higher dose of arava going forward   Blood pressure medicine and blood pressure levels reviewed today with patient. Compliant with blood pressure medicine. States does not miss a dose. No obvious side effects. Blood pressure generally good when checked elsewhere. Watching salt intake.  Mostly syst   in the 120s  Notes sneezing and cong and tend and soreness in the nasal pasage  Sinus problems since about April. Not able to come to office due to work hours. Used Claritin, Benadryl. Sneezing and can only breathe out of one nostril. Pt states she has had sores in nostril.   alt nasal cong  Review of Systems No headache, no major weight loss or weight gain, no chest pain no back pain abdominal pain no change in bowel habits complete ROS otherwise negative     Objective:   Physical Exam   Alert and oriented, vitals reviewed and stable, NAD ENT-TM's and ext canals WNL bilat via otoscopic exam Soft palate, tonsils and post pharynx positive nasal discharge congestion and soreness otherwise WNL via oropharyngeal exam Neck-symmetric, no masses; thyroid nonpalpable and nontender Pulmonary-no tachypnea or accessory muscle use; Clear without wheezes via auscultation Card--no abnrml murmurs, rhythm reg and rate WNL Carotid pulses symmetric, without bruits      Assessment & Plan:  1 impression hypertension.  Good control discussed to maintain same meds rationale discussed  2.  Chronic rhinitis with elements of nasal sores will cover with.  Also maintain allergy treatment discussed  3.  Rheumatoid arthritis.  Discussed continues to be followed by specialist  We will also cover with Diflucan with history of yeast infections  Greater than 50% of this 25 minute face to face visit was spent in  counseling and discussion and coordination of care regarding the above diagnosis/diagnosies

## 2018-02-13 ENCOUNTER — Ambulatory Visit: Payer: BLUE CROSS/BLUE SHIELD | Admitting: Family Medicine

## 2018-02-14 ENCOUNTER — Encounter: Payer: Self-pay | Admitting: Rheumatology

## 2018-02-14 ENCOUNTER — Other Ambulatory Visit: Payer: Self-pay | Admitting: Rheumatology

## 2018-02-14 NOTE — Telephone Encounter (Signed)
Last Visit: 01/16/18 Next Visit: 05/23/18  Okay to refill per Dr. Estanislado Pandy

## 2018-02-19 ENCOUNTER — Ambulatory Visit: Payer: 59 | Admitting: Physician Assistant

## 2018-02-19 MED FILL — SIMPONI 50 MG/0.5ML SOAJ: 50 | 28 days supply | Qty: 1 | Fill #0

## 2018-02-21 ENCOUNTER — Encounter: Payer: Self-pay | Admitting: Rheumatology

## 2018-02-21 ENCOUNTER — Other Ambulatory Visit: Payer: Self-pay | Admitting: *Deleted

## 2018-02-21 DIAGNOSIS — Z79899 Other long term (current) drug therapy: Secondary | ICD-10-CM

## 2018-02-22 LAB — CMP14+EGFR
ALT: 27 IU/L (ref 0–32)
AST: 29 IU/L (ref 0–40)
Albumin/Globulin Ratio: 1.4 (ref 1.2–2.2)
Albumin: 3.9 g/dL (ref 3.5–5.5)
Alkaline Phosphatase: 48 IU/L (ref 39–117)
BUN/Creatinine Ratio: 10 (ref 9–23)
BUN: 8 mg/dL (ref 6–24)
Bilirubin Total: <0.2 mg/dL (ref 0.0–1.2)
CALCIUM: 9 mg/dL (ref 8.7–10.2)
CHLORIDE: 103 mmol/L (ref 96–106)
CO2: 23 mmol/L (ref 20–29)
Creatinine, Ser: 0.82 mg/dL (ref 0.57–1.00)
GFR calc non Af Amer: 89 mL/min/{1.73_m2} (ref >59)
GFR, EST AFRICAN AMERICAN: 102 mL/min/{1.73_m2} (ref >59)
GLUCOSE: 90 mg/dL (ref 65–99)
Globulin, Total: 2.8 g/dL (ref 1.5–4.5)
Potassium: 4.1 mmol/L (ref 3.5–5.2)
Sodium: 139 mmol/L (ref 134–144)
TOTAL PROTEIN: 6.7 g/dL (ref 6.0–8.5)

## 2018-02-22 LAB — CBC WITH DIFFERENTIAL/PLATELET
BASOS ABS: 0 10*3/uL (ref 0.0–0.2)
Basos: 1 %
EOS (ABSOLUTE): 0.1 10*3/uL (ref 0.0–0.4)
Eos: 4 %
Hematocrit: 40.7 % (ref 34.0–46.6)
Hemoglobin: 13.6 g/dL (ref 11.1–15.9)
IMMATURE GRANS (ABS): 0 10*3/uL (ref 0.0–0.1)
IMMATURE GRANULOCYTES: 0 %
LYMPHS: 32 %
Lymphocytes Absolute: 1.1 10*3/uL (ref 0.7–3.1)
MCH: 30.6 pg (ref 26.6–33.0)
MCHC: 33.4 g/dL (ref 31.5–35.7)
MCV: 92 fL (ref 79–97)
Monocytes Absolute: 0.5 10*3/uL (ref 0.1–0.9)
Monocytes: 15 %
NEUTROS PCT: 48 %
Neutrophils Absolute: 1.7 10*3/uL (ref 1.4–7.0)
PLATELETS: 194 10*3/uL (ref 150–450)
RBC: 4.45 x10E6/uL (ref 3.77–5.28)
RDW: 13.6 % (ref 12.3–15.4)
WBC: 3.4 10*3/uL (ref 3.4–10.8)

## 2018-03-01 ENCOUNTER — Encounter: Payer: Self-pay | Admitting: Rheumatology

## 2018-03-01 ENCOUNTER — Other Ambulatory Visit: Payer: Self-pay | Admitting: Internal Medicine

## 2018-03-03 NOTE — Telephone Encounter (Signed)
REFILL request

## 2018-03-04 ENCOUNTER — Encounter (HOSPITAL_COMMUNITY): Payer: Self-pay | Admitting: Emergency Medicine

## 2018-03-04 ENCOUNTER — Other Ambulatory Visit: Payer: Self-pay

## 2018-03-04 ENCOUNTER — Emergency Department (HOSPITAL_COMMUNITY)
Admission: EM | Admit: 2018-03-04 | Discharge: 2018-03-05 | Disposition: A | Discharge status: Home / Self Care | Payer: 59 | Attending: Emergency Medicine | Admitting: Emergency Medicine

## 2018-03-04 ENCOUNTER — Emergency Department (HOSPITAL_COMMUNITY): Payer: 59

## 2018-03-04 DIAGNOSIS — T7840XA Allergy, unspecified, initial encounter: Secondary | ICD-10-CM | POA: Diagnosis not present

## 2018-03-04 DIAGNOSIS — Z79899 Other long term (current) drug therapy: Secondary | ICD-10-CM | POA: Insufficient documentation

## 2018-03-04 DIAGNOSIS — R0789 Other chest pain: Secondary | ICD-10-CM | POA: Diagnosis not present

## 2018-03-04 DIAGNOSIS — R079 Chest pain, unspecified: Secondary | ICD-10-CM | POA: Diagnosis not present

## 2018-03-04 DIAGNOSIS — M6282 Rhabdomyolysis: Secondary | ICD-10-CM | POA: Diagnosis not present

## 2018-03-04 DIAGNOSIS — I10 Essential (primary) hypertension: Secondary | ICD-10-CM | POA: Diagnosis not present

## 2018-03-04 DIAGNOSIS — R5383 Other fatigue: Secondary | ICD-10-CM | POA: Diagnosis present

## 2018-03-04 NOTE — ED Triage Notes (Signed)
Pt c/o sore throat/neck, and c/o chest soreness and back pain since last week. Pt states she recently increased her new med for rheumatoid arthritis.

## 2018-03-05 ENCOUNTER — Ambulatory Visit: Payer: 59 | Admitting: Physician Assistant

## 2018-03-05 ENCOUNTER — Encounter: Payer: Self-pay | Admitting: Physician Assistant

## 2018-03-05 VITALS — BP 132/88 | HR 71 | Resp 13 | Ht 65.0 in | Wt 176.6 lb

## 2018-03-05 DIAGNOSIS — Z8679 Personal history of other diseases of the circulatory system: Secondary | ICD-10-CM

## 2018-03-05 DIAGNOSIS — Z87898 Personal history of other specified conditions: Secondary | ICD-10-CM

## 2018-03-05 DIAGNOSIS — M6282 Rhabdomyolysis: Secondary | ICD-10-CM | POA: Diagnosis not present

## 2018-03-05 DIAGNOSIS — Z8719 Personal history of other diseases of the digestive system: Secondary | ICD-10-CM | POA: Diagnosis not present

## 2018-03-05 DIAGNOSIS — I10 Essential (primary) hypertension: Secondary | ICD-10-CM | POA: Diagnosis not present

## 2018-03-05 DIAGNOSIS — R0781 Pleurodynia: Secondary | ICD-10-CM

## 2018-03-05 DIAGNOSIS — R748 Abnormal levels of other serum enzymes: Secondary | ICD-10-CM

## 2018-03-05 DIAGNOSIS — M0579 Rheumatoid arthritis with rheumatoid factor of multiple sites without organ or systems involvement: Secondary | ICD-10-CM

## 2018-03-05 DIAGNOSIS — T7840XA Allergy, unspecified, initial encounter: Secondary | ICD-10-CM | POA: Diagnosis not present

## 2018-03-05 DIAGNOSIS — Z79899 Other long term (current) drug therapy: Secondary | ICD-10-CM

## 2018-03-05 DIAGNOSIS — R5383 Other fatigue: Secondary | ICD-10-CM

## 2018-03-05 DIAGNOSIS — M797 Fibromyalgia: Secondary | ICD-10-CM

## 2018-03-05 LAB — HEPATIC FUNCTION PANEL
ALBUMIN: 3.5 g/dL (ref 3.5–5.0)
ALT: 38 U/L (ref 0–44)
AST: 44 U/L — ABNORMAL HIGH (ref 15–41)
Alkaline Phosphatase: 49 U/L (ref 38–126)
Bilirubin, Direct: <0.1 mg/dL (ref 0.0–0.2)
TOTAL PROTEIN: 7.3 g/dL (ref 6.5–8.1)
Total Bilirubin: 0.5 mg/dL (ref 0.3–1.2)

## 2018-03-05 LAB — CBC WITH DIFFERENTIAL/PLATELET
BASOS ABS: 0 10*3/uL (ref 0.0–0.1)
BASOS PCT: 0 %
EOS PCT: 4 %
Eosinophils Absolute: 0.2 10*3/uL (ref 0.0–0.7)
HEMATOCRIT: 39.1 % (ref 36.0–46.0)
Hemoglobin: 12.7 g/dL (ref 12.0–15.0)
Lymphocytes Relative: 29 %
Lymphs Abs: 1.6 10*3/uL (ref 0.7–4.0)
MCH: 29.9 pg (ref 26.0–34.0)
MCHC: 32.5 g/dL (ref 30.0–36.0)
MCV: 92 fL (ref 78.0–100.0)
MONO ABS: 0.8 10*3/uL (ref 0.1–1.0)
Monocytes Relative: 14 %
NEUTROS ABS: 2.9 10*3/uL (ref 1.7–7.7)
Neutrophils Relative %: 53 %
PLATELETS: 202 10*3/uL (ref 150–400)
RBC: 4.25 MIL/uL (ref 3.87–5.11)
RDW: 12.9 % (ref 11.5–15.5)
WBC: 5.5 10*3/uL (ref 4.0–10.5)

## 2018-03-05 LAB — BASIC METABOLIC PANEL
ANION GAP: 5 (ref 5–15)
BUN: 14 mg/dL (ref 6–20)
CALCIUM: 8.9 mg/dL (ref 8.9–10.3)
CO2: 27 mmol/L (ref 22–32)
Chloride: 105 mmol/L (ref 98–111)
Creatinine, Ser: 0.77 mg/dL (ref 0.44–1.00)
GFR calc non Af Amer: >60 mL/min (ref >60)
Glucose, Bld: 103 mg/dL — ABNORMAL HIGH (ref 70–99)
Potassium: 3.5 mmol/L (ref 3.5–5.1)
Sodium: 137 mmol/L (ref 135–145)

## 2018-03-05 LAB — TROPONIN I: Troponin I: <0.03 ng/mL (ref <0.03)

## 2018-03-05 LAB — CK
CK TOTAL: 679 U/L — AB (ref 38–234)
Total CK: 578 U/L — ABNORMAL HIGH (ref 38–234)

## 2018-03-05 LAB — D-DIMER, QUANTITATIVE (NOT AT ARMC)

## 2018-03-05 MED ORDER — SODIUM CHLORIDE 0.9 % IV BOLUS
1000.0000 mL | Freq: Once | INTRAVENOUS | Status: AC
  Administered 2018-03-05: 1000 mL via INTRAVENOUS

## 2018-03-05 MED ORDER — GI COCKTAIL ~~LOC~~
30.0000 mL | Freq: Once | ORAL | Status: AC
  Administered 2018-03-05: 30 mL via ORAL
  Filled 2018-03-05: qty 30

## 2018-03-05 NOTE — Progress Notes (Deleted)
Office Visit Note  Patient: Karen Burton             Date of Birth: May 19, 1976           MRN: [id number]             PCP: Mikey Kirschner, MD Referring: Mikey Kirschner, MD Visit Date: 03/07/2018 Occupation: @GUAROCC @  Subjective:  No chief complaint on file.   History of Present Illness: Karen Burton is a 42 y.o. female ***   Activities of Daily Living:  Patient reports morning stiffness for *** {minute/hour:19697}.   Patient {ACTIONS;DENIES/REPORTS:21021675::"Denies"} nocturnal pain.  Difficulty dressing/grooming: {ACTIONS;DENIES/REPORTS:21021675::"Denies"} Difficulty climbing stairs: {ACTIONS;DENIES/REPORTS:21021675::"Denies"} Difficulty getting out of chair: {ACTIONS;DENIES/REPORTS:21021675::"Denies"} Difficulty using hands for taps, buttons, cutlery, and/or writing: {ACTIONS;DENIES/REPORTS:21021675::"Denies"}  No Rheumatology ROS completed.   PMFS History:  Patient Active Problem List   Diagnosis Date Noted  . Encounter for surveillance of contraceptive pills 08/12/2017  . Encounter for well woman exam with routine gynecological exam 08/12/2017  . ANA positive 11/06/2016  . Fibromyalgia 11/06/2016  . Menorrhagia with regular cycle 12/20/2015  . Dysmenorrhea 12/20/2015  . Uterine leiomyoma 12/20/2015  . Vulvar itching 12/20/2015  . Anemia 12/20/2015  . Essential hypertension 01/12/2013  . Esophageal reflux 01/12/2013  . Rheumatoid arthritis ([place]) 01/12/2013  . Rash and nonspecific skin eruption 01/12/2013    Past Medical History:  Diagnosis Date  . Anemia   . Arthritis   . Dysmenorrhea 12/20/2015  . Eczema    mild  . Fibroids 12/20/2015  . Fibromyalgia   . Menorrhagia with regular cycle 12/20/2015  . Rash   . Reflux   . Rhinitis    chonic  . Vitamin D deficiency   . Vulvar itching 12/20/2015    Family History  Problem Relation Age of Onset  . Diabetes Mother   . Hypertension Mother   . Asthma Mother   . Cancer Mother        breast  .  Thyroid disease Mother   . Arthritis Mother   . Graves' disease Mother   . Cancer Maternal Grandmother        pancreatic   Past Surgical History:  Procedure Laterality Date  . ESOPHAGOGASTRODUODENOSCOPY ENDOSCOPY    . IR GENERIC HISTORICAL  02/07/2016   IR RADIOLOGIST EVAL & MGMT 02/07/2016 [name], MD [facility] INTERV RAD  . WISDOM TOOTH EXTRACTION     Social History   Social History Narrative  . Not on file    Objective: Vital Signs: There were no vitals taken for this visit.   Physical Exam   Musculoskeletal Exam: ***  CDAI Exam: No CDAI exam completed.   Investigation: No additional findings.  Imaging: Dg Chest 2 View  Result Date: 03/04/2018 CLINICAL DATA:  Acute onset of lower bilateral chest pain and sore throat. Mid chest pain. EXAM: CHEST - 2 VIEW COMPARISON:  None. FINDINGS: The lungs are well-aerated and clear. There is no evidence of focal opacification, pleural effusion or pneumothorax. The heart is normal in size; the mediastinal contour is within normal limits. No acute osseous abnormalities are seen. IMPRESSION: No acute cardiopulmonary process seen. Electronically Signed   By: Garald Balding M.D.   On: 03/04/2018 22:58    Recent Labs: Lab Results  Component Value Date   WBC 5.5 03/04/2018   HGB 12.7 03/04/2018   PLT 202 03/04/2018   NA 137 03/04/2018   K 3.5 03/04/2018   CL 105 03/04/2018   CO2 27 03/04/2018   GLUCOSE  103 (H) 03/04/2018   BUN 14 03/04/2018   CREATININE 0.77 03/04/2018   BILITOT 0.5 03/04/2018   ALKPHOS 49 03/04/2018   AST 44 (H) 03/04/2018   ALT 38 03/04/2018   PROT 7.3 03/04/2018   ALBUMIN 3.5 03/04/2018   CALCIUM 8.9 03/04/2018   GFRAA >60 03/04/2018   QFTBGOLDPLUS NEGATIVE 07/12/2017    Speciality Comments: No specialty comments available.  Procedures:  No procedures performed Allergies: Ceftin [cefuroxime axetil]; Humira [adalimumab]; and Methotrexate derivatives   Assessment / Plan:     Visit Diagnoses:  Rheumatoid arthritis involving multiple sites with positive rheumatoid factor (HCC) - Positive RF, negative anti-CCP, positive ANA:  High risk medication use - Simponi inj once a month. TB gold 07/12/17  Fibromyalgia  Other fatigue  History of insomnia  History of gastroesophageal reflux (GERD)  History of hypertension   Orders: No orders of the defined types were placed in this encounter.  No orders of the defined types were placed in this encounter.   Face-to-face time spent with patient was *** minutes. Greater than 50% of time was spent in counseling and coordination of care.  Follow-Up Instructions: No follow-ups on file.   Ofilia Neas, PA-C  Note - This record has been created using Dragon software.  Chart creation errors have been sought, but may not always  have been located. Such creation errors do not reflect on  the standard of medical care.

## 2018-03-05 NOTE — Discharge Instructions (Addendum)
There is no evidence of heart attack or blood clot in the lung.  Your muscle enzymes are mildly elevated and you should increase your hydration at home.  Follow-up with your doctor to recheck your CK levels in 2 days.  You should also discuss the side effects of your new medication with Dr. Estanislado Pandy.  Return to the ED if you develop new or worsening symptoms.

## 2018-03-05 NOTE — ED Provider Notes (Signed)
[facility] EMERGENCY DEPARTMENT Provider Note   CSN: [id number] Arrival date & time: 03/04/18  2108     History   Chief Complaint Chief Complaint  Patient presents with  . Allergic Reaction    HPI Karen Burton is a 42 y.o. female with past medical history most significant for rheumatoid arthritis, fibromyalgia and HTN presenting with a 1 week history of myalgias, fatigue and a globus sensation in her throat.  She states the globus started initially and slowly evolved into back, chest and abdominal soreness which she describes as muscle soreness "like when you have coughed too much" but she has not been coughing.  She was recently started ramping dose of Arava for her RA and is concerned she may be reacting to this medicine.  She denies pain or swelling in her extremities, also denies fevers, chills, headache, rash.  She has developed mid sternal chest pain with deep inspiration but denies shortness of breath, dizziness, palpations.  She has had no treatments nor has she found any alleviators for her symptoms.   The history is provided by the patient.    Past Medical History:  Diagnosis Date  . Anemia   . Arthritis   . Dysmenorrhea 12/20/2015  . Eczema    mild  . Fibroids 12/20/2015  . Fibromyalgia   . Menorrhagia with regular cycle 12/20/2015  . Rash   . Reflux   . Rhinitis    chonic  . Vitamin D deficiency   . Vulvar itching 12/20/2015    Patient Active Problem List   Diagnosis Date Noted  . Encounter for surveillance of contraceptive pills 08/12/2017  . Encounter for well woman exam with routine gynecological exam 08/12/2017  . ANA positive 11/06/2016  . Fibromyalgia 11/06/2016  . Menorrhagia with regular cycle 12/20/2015  . Dysmenorrhea 12/20/2015  . Uterine leiomyoma 12/20/2015  . Vulvar itching 12/20/2015  . Anemia 12/20/2015  . Essential hypertension 01/12/2013  . Esophageal reflux 01/12/2013  . Rheumatoid arthritis ([place]) 01/12/2013  . Rash and  nonspecific skin eruption 01/12/2013    Past Surgical History:  Procedure Laterality Date  . ESOPHAGOGASTRODUODENOSCOPY ENDOSCOPY    . IR GENERIC HISTORICAL  02/07/2016   IR RADIOLOGIST EVAL & MGMT 02/07/2016 [name], MD [facility] INTERV RAD  . WISDOM TOOTH EXTRACTION       OB History    Gravida  1   Para      Term      Preterm      AB  1   Living        SAB      TAB      Ectopic      Multiple      Live Births               Home Medications    Prior to Admission medications   Medication Sig Start Date End Date Taking? Authorizing Provider  acetaminophen (TYLENOL) 500 MG tablet Take 1,000 mg by mouth as needed.    [provider]  amLODipine (NORVASC) 5 MG tablet Take 1 tablet (5 mg total) by mouth daily. for blood pressure 02/07/18   Mikey Kirschner, MD  docusate sodium (COLACE) 100 MG capsule Take 100 mg by mouth daily.     [provider]  doxycycline (VIBRA-TABS) 100 MG tablet Take 1 tablet (100 mg total) by mouth 2 (two) times daily. 02/07/18   Mikey Kirschner, MD  DULoxetine (CYMBALTA) 60 MG capsule TAKE 1 CAPSULE BY MOUTH  ONCE DAILY 02/14/18   Bo Merino, MD  ferrous sulfate 325 (65 FE) MG tablet Take 325 mg by mouth daily with breakfast.     [provider]  fluconazole (DIFLUCAN) 150 MG tablet One p o three d apart 02/07/18   Mikey Kirschner, MD  Golimumab (SIMPONI) 50 MG/0.5ML SOAJ INJECT 50MG  UNDER THE SKIN ONCE EVERY 30 DAYS 02/07/18   Tresa Garter, MD  hydrochlorothiazide (MICROZIDE) 12.5 MG capsule Take 1 capsule (12.5 mg total) by mouth every morning. 02/07/18   Mikey Kirschner, MD  ibuprofen (ADVIL,MOTRIN) 200 MG tablet Take 200 mg by mouth as needed.    [provider]  JUNEL FE 24 1-20 MG-MCG(24) tablet Take 1 tablet by mouth daily. 08/12/17   Estill Dooms, NP  leflunomide (ARAVA) 10 MG tablet Take 1 tablet by mouth daily for 2 weeks. If labs are stable in 2 weeks, take 2 tablets by mouth  daily. 01/16/18   Ofilia Neas, PA-C  Multiple Vitamins-Minerals (MULTIVITAMIN WOMEN PO) Take by mouth daily.    [provider]  naproxen sodium (ANAPROX) 220 MG tablet Take 220 mg by mouth as needed.     [provider]  omeprazole (PRILOSEC) 20 MG capsule Take 20 mg by mouth daily as needed.    [provider]    Family History Family History  Problem Relation Age of Onset  . Diabetes Mother   . Hypertension Mother   . Asthma Mother   . Cancer Mother        breast  . Thyroid disease Mother   . Arthritis Mother   . Graves' disease Mother   . Cancer Maternal Grandmother        pancreatic    Social History Social History   Tobacco Use  . Smoking status: Never Smoker  . Smokeless tobacco: Never Used  Substance Use Topics  . Alcohol use: No  . Drug use: Never     Allergies   Ceftin [cefuroxime axetil]; Humira [adalimumab]; and Methotrexate derivatives   Review of Systems Review of Systems  Constitutional: Negative for chills and fever.  HENT: Negative for congestion, trouble swallowing and voice change.        Negative except as mentioned in HPI.   Eyes: Negative.   Respiratory: Negative for chest tightness, shortness of breath, wheezing and stridor.   Cardiovascular: Positive for chest pain.  Gastrointestinal: Negative for abdominal pain and nausea.  Genitourinary: Negative.   Musculoskeletal: Positive for myalgias. Negative for arthralgias, joint swelling and neck pain.  Skin: Negative.  Negative for rash and wound.  Neurological: Negative for dizziness, weakness, light-headedness, numbness and headaches.  Psychiatric/Behavioral: Negative.      Physical Exam Updated Vital Signs BP (!) 158/97   Pulse 71   Temp 98.4 F (36.9 C) (Oral)   Resp 17   Ht 5\' 5"  (1.651 m)   Wt 78.9 kg (174 lb)   LMP 10/02/2017 (Within Weeks)   SpO2 100%   BMI 28.96 kg/m   Physical Exam  Constitutional: She is oriented to person, place, and time.  She appears well-developed and well-nourished.  HENT:  Head: Normocephalic and atraumatic.  Eyes: Conjunctivae are normal.  Neck: Normal range of motion.  Cardiovascular: Normal rate, regular rhythm, normal heart sounds and intact distal pulses. Exam reveals no friction rub.  No murmur heard. Pulmonary/Chest: Effort normal and breath sounds normal. She has no wheezes. She exhibits tenderness.  midsternal tenderness  Abdominal: Soft. Bowel sounds are normal.  She exhibits no mass. There is no tenderness. There is no guarding.  Musculoskeletal: Normal range of motion.  Neurological: She is alert and oriented to person, place, and time. No cranial nerve deficit.  Skin: Skin is warm and dry. Capillary refill takes less than 2 seconds.  Psychiatric: She has a normal mood and affect.  Nursing note and vitals reviewed.    ED Treatments / Results  Labs (all labs ordered are listed, but only abnormal results are displayed) Labs Reviewed  BASIC METABOLIC PANEL - Abnormal; Notable for the following components:      Result Value   Glucose, Bld 103 (*)    All other components within normal limits  HEPATIC FUNCTION PANEL - Abnormal; Notable for the following components:   AST 44 (*)    All other components within normal limits  CK - Abnormal; Notable for the following components:   Total CK 679 (*)    All other components within normal limits  CBC WITH DIFFERENTIAL/PLATELET  TROPONIN I  D-DIMER, QUANTITATIVE (NOT AT [facility])    EKG EKG Interpretation  Date/Time:  Wednesday March 05 2018 00:07:56 EDT Ventricular Rate:  69 PR Interval:    QRS Duration: 90 QT Interval:  382 QTC Calculation: 410 R Axis:   58 Text Interpretation:  Sinus rhythm Short PR interval No previous ECGs available Confirmed by Ezequiel Essex [phone]) on 03/05/2018 12:11:17 AM   Radiology Dg Chest 2 View  Result Date: 03/04/2018 CLINICAL DATA:  Acute onset of lower bilateral chest pain and sore throat. Mid chest  pain. EXAM: CHEST - 2 VIEW COMPARISON:  None. FINDINGS: The lungs are well-aerated and clear. There is no evidence of focal opacification, pleural effusion or pneumothorax. The heart is normal in size; the mediastinal contour is within normal limits. No acute osseous abnormalities are seen. IMPRESSION: No acute cardiopulmonary process seen. Electronically Signed   By: Garald Balding M.D.   On: 03/04/2018 22:58    Procedures Procedures (including critical care time)  Medications Ordered in ED Medications - No data to display   Initial Impression / Assessment and Plan / ED Course  I have reviewed the triage vital signs and the nursing notes.  Pertinent labs & imaging results that were available during my care of the patient were reviewed by me and considered in my medical decision making (see chart for details).     Pt with multiple sx of unclear etiology, not clearly SE of her new med [place] per med profile.  She does have significantly elevated CK, suggesting rhabdomyolysis.  She was given IV fluids, after which labs will be rechecked.   Discussed with Dr Wyvonnia Dusky who will follow and dispo pt.  Final Clinical Impressions(s) / ED Diagnoses   Final diagnoses:  None    ED Discharge Orders    None       Landis Martins 03/05/18 0126    Ezequiel Essex, MD 03/05/18 [phone]

## 2018-03-05 NOTE — Progress Notes (Signed)
Office Visit Note  Patient: Karen Burton             Date of Birth: 1976/07/18           MRN: [id number]             PCP: Mikey Kirschner, MD Referring: Mikey Kirschner, MD Visit Date: 03/05/2018 Occupation: @GUAROCC @  Subjective:  Muscle aches    History of Present Illness: Karen Burton is a 42 y.o. female with history of seropositive rheumatoid arthritis and fibromyalgia.  Patient was started on [place] in January 16, 2018.  She presented to the emergency department last night for evaluation of chest tightness.  CK level was elevated.  She is advised to follow-up in our office.  She was concerned that this was a side effect of Arava.  She denies any other side effects since starting on Areva.  She denies any GI side effects.  She states she has been tolerating it well.  She states that she was given a GI cocktail as well as fluids to help with the hoarseness and globus sensation.  She states that her symptoms started about 1 week ago.  She states that she is having a lot of muscle aches and chest tightness.  She states that the pain is most severe if she is taking deep breaths.  She reports that she did not notice any benefit while on Areva.  She states that she continues to perform Symphony subcutaneous injections once monthly and her last injection was on Monday.  She denies any muscle aches or muscle tenderness in her upper and lower extremities at this time.  She states she is having pain in bilateral hands and bilateral feet.  She states that she is noticed some swelling in her feet as well as hand stiffness.  Activities of Daily Living:  Patient reports morning stiffness for 30-60 minutes.   Patient Reports nocturnal pain.  Difficulty dressing/grooming: Denies Difficulty climbing stairs: Reports Difficulty getting out of chair: Reports Difficulty using hands for taps, buttons, cutlery, and/or writing: Denies  Review of Systems  Constitutional: Positive for fatigue.  HENT:  Positive for sore throat, voice change (Hoarseness) and nose dryness. Negative for mouth sores and mouth dryness.   Eyes: Positive for dryness. Negative for pain and visual disturbance.  Respiratory: Negative for cough, hemoptysis, shortness of breath and difficulty breathing.   Cardiovascular: Negative for chest pain, palpitations, hypertension and swelling in legs/feet.  Gastrointestinal: Negative for blood in stool, constipation and diarrhea.  Endocrine: Negative for increased urination.  Genitourinary: Negative for painful urination.  Musculoskeletal: Positive for arthralgias, joint pain, myalgias, morning stiffness and myalgias. Negative for joint swelling, muscle weakness and muscle tenderness.  Skin: Negative for color change, pallor, rash, hair loss, nodules/bumps, skin tightness, ulcers and sensitivity to sunlight.  Allergic/Immunologic: Negative for susceptible to infections.  Neurological: Negative for dizziness, numbness, headaches and weakness.  Hematological: Negative for swollen glands.  Psychiatric/Behavioral: Negative for depressed mood and sleep disturbance. The patient is not nervous/anxious.     PMFS History:  Patient Active Problem List   Diagnosis Date Noted  . Encounter for surveillance of contraceptive pills 08/12/2017  . Encounter for well woman exam with routine gynecological exam 08/12/2017  . ANA positive 11/06/2016  . Fibromyalgia 11/06/2016  . Menorrhagia with regular cycle 12/20/2015  . Dysmenorrhea 12/20/2015  . Uterine leiomyoma 12/20/2015  . Vulvar itching 12/20/2015  . Anemia 12/20/2015  . Essential hypertension 01/12/2013  . Esophageal reflux 01/12/2013  .  Rheumatoid arthritis ([place]) 01/12/2013  . Rash and nonspecific skin eruption 01/12/2013    Past Medical History:  Diagnosis Date  . Anemia   . Arthritis   . Dysmenorrhea 12/20/2015  . Eczema    mild  . Fibroids 12/20/2015  . Fibromyalgia   . Menorrhagia with regular cycle 12/20/2015  . Rash    . Reflux   . Rhinitis    chonic  . Vitamin D deficiency   . Vulvar itching 12/20/2015    Family History  Problem Relation Age of Onset  . Diabetes Mother   . Hypertension Mother   . Asthma Mother   . Cancer Mother        breast  . Thyroid disease Mother   . Arthritis Mother   . Graves' disease Mother   . Cancer Maternal Grandmother        pancreatic   Past Surgical History:  Procedure Laterality Date  . ESOPHAGOGASTRODUODENOSCOPY ENDOSCOPY    . IR GENERIC HISTORICAL  02/07/2016   IR RADIOLOGIST EVAL & MGMT 02/07/2016 [name], MD [facility] INTERV RAD  . WISDOM TOOTH EXTRACTION     Social History   Social History Narrative  . Not on file    Objective: Vital Signs: BP 132/88 (BP Location: Left Arm, Patient Position: Sitting, Cuff Size: Normal)   Pulse 71   Resp 13   Ht 5\' 5"  (1.651 m)   Wt 176 lb 9.6 oz (80.1 kg)   BMI 29.39 kg/m    Physical Exam  Constitutional: She is oriented to person, place, and time. She appears well-developed and well-nourished.  HENT:  Head: Normocephalic and atraumatic.  Eyes: Conjunctivae and EOM are normal.  Neck: Normal range of motion.  Cardiovascular: Normal rate, regular rhythm, normal heart sounds and intact distal pulses. Exam reveals no friction rub.  No murmur heard. Pulmonary/Chest: Effort normal and breath sounds normal. She has no wheezes. She has no rales. She exhibits tenderness.  Pleuritic chest pain  Abdominal: Soft. Bowel sounds are normal.  Lymphadenopathy:    She has no cervical adenopathy.  Neurological: She is alert and oriented to person, place, and time.  Skin: Skin is warm and dry. Capillary refill takes less than 2 seconds.  Psychiatric: She has a normal mood and affect. Her behavior is normal.  Nursing note and vitals reviewed.    Musculoskeletal Exam: C-spine, thoracic spine, lumbar spine good range of motion.  No midline spinal tenderness.  No SI joint tenderness.  Shoulder joints, elbow joints, wrist  joints, MCPs, PIPs, DIPs good range of motion no synovitis.  She is complete fist formation bilaterally.  Hip joints, knee joints, ankle joints, MTPs, PIPs, DIPs good range of motion no synovitis.  No warmth or effusion of bilateral knee joints.  Tenderness of trochanteric bursa bilaterally.  CDAI Exam: CDAI Homunculus Exam:   Joint Counts:  CDAI Tender Joint count: 0 CDAI Swollen Joint count: 0  Global Assessments:  Patient Global Assessment: 3 Provider Global Assessment: 3  CDAI Calculated Score: 6   Investigation: No additional findings.  Imaging: Dg Chest 2 View  Result Date: 03/04/2018 CLINICAL DATA:  Acute onset of lower bilateral chest pain and sore throat. Mid chest pain. EXAM: CHEST - 2 VIEW COMPARISON:  None. FINDINGS: The lungs are well-aerated and clear. There is no evidence of focal opacification, pleural effusion or pneumothorax. The heart is normal in size; the mediastinal contour is within normal limits. No acute osseous abnormalities are seen. IMPRESSION: No acute cardiopulmonary process seen.  Electronically Signed   By: Garald Balding M.D.   On: 03/04/2018 22:58    Recent Labs: Lab Results  Component Value Date   WBC 5.5 03/04/2018   HGB 12.7 03/04/2018   PLT 202 03/04/2018   NA 137 03/04/2018   K 3.5 03/04/2018   CL 105 03/04/2018   CO2 27 03/04/2018   GLUCOSE 103 (H) 03/04/2018   BUN 14 03/04/2018   CREATININE 0.77 03/04/2018   BILITOT 0.5 03/04/2018   ALKPHOS 49 03/04/2018   AST 44 (H) 03/04/2018   ALT 38 03/04/2018   PROT 7.3 03/04/2018   ALBUMIN 3.5 03/04/2018   CALCIUM 8.9 03/04/2018   GFRAA >60 03/04/2018   QFTBGOLDPLUS NEGATIVE 07/12/2017    Speciality Comments: No specialty comments available.  Procedures:  No procedures performed Allergies: Ceftin [cefuroxime axetil]; Humira [adalimumab]; and Methotrexate derivatives   Assessment / Plan:     Visit Diagnoses: Rheumatoid arthritis involving multiple sites with positive rheumatoid factor  (HCC) - Positive RF, negative anti-CCP, positive ANA: She has no active synovitis on exam.  She has not had any recent rheumatoid arthritis flares.  Her last sub-cutaneous injection of Simponi was on Monday.  She started on Arava 10 mg by mouth 1 tablet daily on 01/16/2018.  She return for lab work and her labs are stable so she increase her dose to 20 mg by mouth daily.  Last week she noticed chest tightness as well as muscle tenderness in the thoracic region.  She presented to the ED last night for evaluation.  EKG was non-ischemic. D-dimer negative. Troponin x2 negative.  CK was elevated but trended down following administration of fluids. She continues to have pleuritic chest pain as well as a globus sensation.  She was advised to discontinue Arava.  She will continue on Simponi sq injections once monthly.  She will follow up with [place] in 1 month.   High risk medication use - Simponi, [place].  BMP and CBC were drawn on 03/04/2018.  AST was mildly elevated.  We will continue to monitor.  Fibromyalgia: She continues to have generalized myalgias.  She does not exercise on a regular basis.  Elevated CK -CT was elevated to 679 and after administration of fluids trended down to 578.  Troponin is negative x2.  D-dimer negative.  She continues to have midsternal chest tenderness.  She has pleuritic chest pain.  We will check aldolase, myositis panel and, CK.  Plan: Aldolase, Myositis Assessr Plus Jo-1 Autoabs, CK Total (and CKMB)  Pleuritic chest pain -D-dimer is negative on 03/04/2018.  Troponin negative x2, CK elevated but trended down following administration of fluids.  We will recheck CK today.  A referral to pulmonology will be placed today due to new onset pleuritic chest pain.  Lungs were clear to auscultation today.  plan: Ambulatory referral to Pulmonology   Other fatigue: Chronic.  History of insomnia: Chronic.  Other medical conditions are listed as follows:  History of hypertension  History of  gastroesophageal reflux (GERD)   Orders: Orders Placed This Encounter  Procedures  . Aldolase  . Myositis Assessr Plus Jo-1 Autoabs  . CK Total (and CKMB)  . Ambulatory referral to Pulmonology   No orders of the defined types were placed in this encounter.   Face-to-face time spent with patient was 30 minutes. Greater than 50% of time was spent in counseling and coordination of care.  Follow-Up Instructions: Return in about 1 month (around 04/02/2018) for Rheumatoid arthritis, Fibromyalgia.   Geni Bers  [place]end[place] Ka   I examined and evaluated the patient with Hazel Sams PA.  Patient had no synovitis on examination today.  We advised her to discontinue Arava and continue with Simponi subcu only.  She also was found to have elevated CK in the hospital.  We will check the labs as mentioned above.  The plan of care was discussed as noted above.  Bo Merino, MD  Note - This record has been created using Editor, commissioning.  Chart creation errors have been sought, but may not always  have been located. Such creation errors do not reflect on  the standard of medical care.

## 2018-03-06 ENCOUNTER — Telehealth: Payer: Self-pay | Admitting: Family Medicine

## 2018-03-06 NOTE — Telephone Encounter (Signed)
Patient notified and verbalized understanding. 

## 2018-03-06 NOTE — Telephone Encounter (Signed)
Patient went to ER on 03/04/18 for chest pain.  She was told to follow up with PCP and have another CK test completed.  She said her rheumatologist did this yesterday and wants to know if its necessary for her to see Dr. Richardson Landry to follow up?

## 2018-03-06 NOTE — Telephone Encounter (Signed)
No not nec since they did it

## 2018-03-07 ENCOUNTER — Ambulatory Visit: Payer: 59 | Admitting: Physician Assistant

## 2018-03-15 LAB — MYOSITIS ASSESSR PLUS JO-1 AUTOABS
EJ Autoabs: NOT DETECTED
Jo-1 Autoabs: <1 AI
Ku Autoabs: NOT DETECTED
MI-2 AUTOABS: NOT DETECTED
OJ Autoabs: NOT DETECTED
PL-12 AUTOABS: NOT DETECTED
PL-7 Autoabs: NOT DETECTED
SRP Autoabs: NOT DETECTED

## 2018-03-15 LAB — CK TOTAL AND CKMB (NOT AT ARMC)
CK, MB: 3.6 ng/mL (ref 0–5.0)
Relative Index: 0.7 (ref 0–4.0)
Total CK: 506 U/L — ABNORMAL HIGH (ref 29–143)

## 2018-03-15 LAB — ALDOLASE: Aldolase: 6.3 U/L (ref <=8.1)

## 2018-03-17 ENCOUNTER — Telehealth: Payer: Self-pay | Admitting: *Deleted

## 2018-03-17 DIAGNOSIS — M0579 Rheumatoid arthritis with rheumatoid factor of multiple sites without organ or systems involvement: Secondary | ICD-10-CM

## 2018-03-17 NOTE — Telephone Encounter (Signed)
Lab order placed.

## 2018-03-17 NOTE — Progress Notes (Signed)
Please advise patient to come to the office to have CK drawn one day this week.  Myositis panel WNL.  Aldolase WNL.

## 2018-03-26 NOTE — Progress Notes (Signed)
Office Visit Note  Patient: Karen Burton             Date of Birth: [birth year]           MRN: [id number]             PCP: Mikey Kirschner, MD Referring: Mikey Kirschner, MD Visit Date: 04/09/2018 Occupation: @GUAROCC @  Subjective:  Elevated CK  History of Present Illness: Karen Burton is a 42 y.o. female with history of seropositive rheumatoid arthritis and elevated CK. She discontinued Arava 1 month ago.  She plans on giving her next Simponi sq injection today.  She developed itching of upper and lower extremities about 3 weeks ago.  She has been using topical creams as well as taking Zyrtec and Benadryl daily, which have been slowly improving the itching.  She denies any rashes.  She denies changing any detergents recently.  She states her skin feels sensitive to warm water.  She states the pleuritic chest pain and coughing has resolved.  She denies any shortness of breath.  She reports she has not scheduled an appointment with the cardiologist or pulmunologist yet.  She denies any muscle weakness at this time.  She has some muscle tenderness, which she feels is due to fibromyalgia.  She denies any muscle cramps.  She denies any difficulty getting up from a chair or raising arms above head. She continues to have chronic fatigue.  She denies any joint pain or joint swelling.  She denies any joint stiffness.     Activities of Daily Living:  Patient reports morning stiffness for 0  minutes.   Patient Denies nocturnal pain.  Difficulty dressing/grooming: Denies Difficulty climbing stairs: Reports Difficulty getting out of chair: Denies Difficulty using hands for taps, buttons, cutlery, and/or writing: Denies  Review of Systems  Constitutional: Positive for fatigue.  HENT: Negative for mouth sores, mouth dryness and nose dryness.   Eyes: Negative for pain, visual disturbance and dryness.  Respiratory: Negative for cough, hemoptysis, shortness of breath and difficulty  breathing.   Cardiovascular: Negative for chest pain, palpitations, hypertension and swelling in legs/feet.  Gastrointestinal: Positive for constipation. Negative for blood in stool and diarrhea.  Endocrine: Negative for increased urination.  Genitourinary: Negative for painful urination.  Musculoskeletal: Negative for arthralgias, joint pain, joint swelling, myalgias, muscle weakness, morning stiffness, muscle tenderness and myalgias.  Skin: Negative for color change, pallor, rash, hair loss, nodules/bumps, skin tightness, ulcers and sensitivity to sunlight.  Allergic/Immunologic: Negative for susceptible to infections.  Neurological: Negative for dizziness, numbness, headaches and weakness.  Hematological: Negative for swollen glands.  Psychiatric/Behavioral: Negative for depressed mood and sleep disturbance. The patient is not nervous/anxious.     PMFS History:  Patient Active Problem List   Diagnosis Date Noted  . Encounter for surveillance of contraceptive pills 08/12/2017  . Encounter for well woman exam with routine gynecological exam 08/12/2017  . ANA positive 11/06/2016  . Fibromyalgia 11/06/2016  . Menorrhagia with regular cycle 12/20/2015  . Dysmenorrhea 12/20/2015  . Uterine leiomyoma 12/20/2015  . Vulvar itching 12/20/2015  . Anemia 12/20/2015  . Essential hypertension 01/12/2013  . Esophageal reflux 01/12/2013  . Rheumatoid arthritis ([place]) 01/12/2013  . Rash and nonspecific skin eruption 01/12/2013    Past Medical History:  Diagnosis Date  . Anemia   . Arthritis   . Dysmenorrhea 12/20/2015  . Eczema    mild  . Fibroids 12/20/2015  . Fibromyalgia   . Menorrhagia with regular cycle 12/20/2015  .  Rash   . Reflux   . Rhinitis    chonic  . Vitamin D deficiency   . Vulvar itching 12/20/2015    Family History  Problem Relation Age of Onset  . Diabetes Mother   . Hypertension Mother   . Asthma Mother   . Cancer Mother        breast  . Thyroid disease Mother     . Arthritis Mother   . Graves' disease Mother   . Cancer Maternal Grandmother        pancreatic   Past Surgical History:  Procedure Laterality Date  . ESOPHAGOGASTRODUODENOSCOPY ENDOSCOPY    . IR GENERIC HISTORICAL  02/07/2016   IR RADIOLOGIST EVAL & MGMT 02/07/2016 [name], MD [facility] INTERV RAD  . WISDOM TOOTH EXTRACTION     Social History   Social History Narrative  . Not on file    Objective: Vital Signs: BP 126/84 (BP Location: Left Arm, Patient Position: Sitting, Cuff Size: Normal)   Pulse 96   Resp 12   Ht 5\' 5"  (1.651 m)   Wt 175 lb 12.8 oz (79.7 kg)   BMI 29.25 kg/m    Physical Exam  Constitutional: She is oriented to person, place, and time. She appears well-developed and well-nourished.  HENT:  Head: Normocephalic and atraumatic.  Eyes: Conjunctivae and EOM are normal.  Neck: Normal range of motion.  Cardiovascular: Normal rate, regular rhythm, normal heart sounds and intact distal pulses.  Pulmonary/Chest: Effort normal and breath sounds normal.  Abdominal: Soft. Bowel sounds are normal.  Lymphadenopathy:    She has no cervical adenopathy.  Neurological: She is alert and oriented to person, place, and time.  Skin: Skin is warm and dry. Capillary refill takes less than 2 seconds.  Excoriations on arms and legs   Psychiatric: She has a normal mood and affect. Her behavior is normal.  Nursing note and vitals reviewed.    Musculoskeletal Exam: C-spine, thoracic spine, and lumbar spine good ROM.  No midline spinal tenderness.  No SI joint tenderness.  Shoulder joints, elbow joints, wrist joints, MCPs, PIPs, and DIPs good ROM with no synovitis.  She has complete fist formation.  Hip joints, knee joints, ankle joints, MTPs, PIPs, and DIPs good ROM with no synovitis.  No warmth or effusion of knee joints.  No tenderness of trochanteric bursa bilaterally. 5/5 strength of upper and lower extremities bilaterally.  No difficulty rising from a chair or raising arms above  her head.      CDAI Exam: CDAI Score: 0.8  Patient Global Assessment: 4 (mm); Provider Global Assessment: 4 (mm) Swollen: 0 ; Tender: 0  Joint Exam   Not documented   There is currently no information documented on the homunculus. Go to the Rheumatology activity and complete the homunculus joint exam.  Investigation: No additional findings.  Imaging: No results found.  Recent Labs: Lab Results  Component Value Date   WBC 4.5 04/04/2018   HGB 13.1 04/04/2018   PLT 233 04/04/2018   NA 142 04/04/2018   K 3.4 (L) 04/04/2018   CL 101 04/04/2018   CO2 23 04/04/2018   GLUCOSE 88 04/04/2018   BUN 9 04/04/2018   CREATININE 0.84 04/04/2018   BILITOT <0.2 04/04/2018   ALKPHOS 56 04/04/2018   AST 37 04/04/2018   ALT 33 (H) 04/04/2018   PROT 7.0 04/04/2018   ALBUMIN 4.0 04/04/2018   CALCIUM 9.1 04/04/2018   GFRAA 99 04/04/2018   QFTBGOLDPLUS NEGATIVE 07/12/2017  Speciality Comments: No specialty comments available.  Procedures:  No procedures performed Allergies: Ceftin [cefuroxime axetil]; Humira [adalimumab]; and Methotrexate derivatives   Assessment / Plan:     Visit Diagnoses: Rheumatoid arthritis involving multiple sites with positive rheumatoid factor (HCC) - Positive RF, negative anti-CCP, positive ANA:  She has no synovitis on exam.  She has not joint pain at this time.  She discontinued Arava 1 month ago due to developing skin itching.  No rash was evident.  She has been taking Zyrtec and Benadryl on a daily basis, which have been improving the itching.  She has excoriations present on arms and legs.  She was encouraged to not scratch due to the risk of a secondary infection.  She is going to be injecting her next Simponi sq injection today.  She was advised to notify us if she develops increased joint pain or joint swelling.  She will follow up in 1 month.    High risk medication use -  Simponi sq injections once a month. D/c Arava due to developing skin irritation  but no rash evident. CBC and CMP were drawn yesterday.   Elevated CK: Myositis panel negative, aldolase WNL.  CK is 578. She has 5/5 muscle strength of upper and lower extremities.  She had no difficulty getting up from a chair or raising her arms above her head. She will seeing Dr. Einar Gip on Monday at 12:30 pm.   Pleuritic chest pain: Resolved.  She has no chest pain, shortness of breath, or coughing at this time.  She plans on still going for a pulmonology and cardiology consult.  She is scheduled to see Dr. Einar Gip on Monday at 12:30 pm.   Fibromyalgia: She continues to have generalized muscle aches due to fibromyalgia.  She has no muscle weakness at this time.  She has chronic fatigue and insomnia.    Other fatigue: Chonic  History of insomnia: Chronic   Other medical conditions are listed as follows:   History of hypertension  History of gastroesophageal reflux (GERD)   Orders: No orders of the defined types were placed in this encounter.  No orders of the defined types were placed in this encounter.   Face-to-face time spent with patient was 30 minutes. Greater than 50% of time was spent in counseling and coordination of care.  Follow-Up Instructions: Return in about 4 weeks (around 05/07/2018) for Rheumatoid arthritis, Elevated CK .   Ofilia Neas, PA-C   I examined and evaluated the patient with Hazel Sams PA.  Patient had no synovitis on my examination.  She has been having pruritus but no rash was noted.  The pruritus is improved over time.  She believes that pruritus was related to her liver.  Her pleuritic chest pain has improved.  Her CKs have been elevated.  Today on examination her muscle strength was good in all 4 extremities.  Although she had cardiology work-up while she was in the emergency room as she has been having persistent chest pain and elevated CK we referred her to cardiology.  Patient has appointment next Monday.  If the cardiology work-up is negative I may  consider getting EMG nerve conduction and muscle biopsy.  The plan of care was discussed as noted above.  Bo Merino, MD  Note - This record has been created using Editor, commissioning.  Chart creation errors have been sought, but may not always  have been located. Such creation errors do not reflect on  the standard of medical care.

## 2018-04-02 MED FILL — SIMPONI 50 MG/0.5ML SOAJ: 50 | 28 days supply | Qty: 1 | Fill #1

## 2018-04-03 ENCOUNTER — Encounter: Payer: Self-pay | Admitting: Rheumatology

## 2018-04-03 ENCOUNTER — Encounter: Payer: Self-pay | Admitting: Family Medicine

## 2018-04-03 ENCOUNTER — Telehealth: Payer: Self-pay | Admitting: Rheumatology

## 2018-04-03 NOTE — Telephone Encounter (Signed)
Patient left a voicemail stating her legs and arms are very itchy.  Patient states she doesn't have a rash, but can't stop itching.  Patient requested a return call.

## 2018-04-03 NOTE — Telephone Encounter (Signed)
Advised patient to follow up with PCP for the itching she is experiencing. Patient verbalized understanding.

## 2018-04-03 NOTE — Telephone Encounter (Signed)
Attempted to contact patient and left message on machine to advise patient to call the office.  

## 2018-04-04 ENCOUNTER — Other Ambulatory Visit: Payer: Self-pay | Admitting: *Deleted

## 2018-04-04 ENCOUNTER — Other Ambulatory Visit: Payer: Self-pay | Admitting: Physician Assistant

## 2018-04-04 DIAGNOSIS — M0579 Rheumatoid arthritis with rheumatoid factor of multiple sites without organ or systems involvement: Secondary | ICD-10-CM | POA: Diagnosis not present

## 2018-04-04 DIAGNOSIS — Z79899 Other long term (current) drug therapy: Secondary | ICD-10-CM | POA: Diagnosis not present

## 2018-04-04 NOTE — Telephone Encounter (Signed)
It is possible that this could be either allergy to something you ingested or possibly to something the patient was exposed to it is even possible it could be a medication allergy causing this  I would recommend for now Kenalog cream applied twice daily as needed, 60 g, 4 refills I also recommend a standard follow-up visit with Dr. Richardson Landry in the near future to look into this issue closer

## 2018-04-07 ENCOUNTER — Telehealth (INDEPENDENT_AMBULATORY_CARE_PROVIDER_SITE_OTHER): Payer: Self-pay | Admitting: *Deleted

## 2018-04-07 NOTE — Telephone Encounter (Signed)
Spoke with Kenney Houseman and advised we do need CK added to her lab work. Test has been added.

## 2018-04-07 NOTE — Telephone Encounter (Signed)
Received call from [facility] with [facility] wanting to know if you are wanting a CK done on this pt. She already drew CBC and CMP, if you are needing a CK please add order and needs test code.   Please call back at [phone] ext [phone] Kenney Houseman)

## 2018-04-08 ENCOUNTER — Telehealth: Payer: Self-pay | Admitting: *Deleted

## 2018-04-08 DIAGNOSIS — R748 Abnormal levels of other serum enzymes: Secondary | ICD-10-CM

## 2018-04-08 DIAGNOSIS — R079 Chest pain, unspecified: Secondary | ICD-10-CM

## 2018-04-08 LAB — COMPREHENSIVE METABOLIC PANEL
ALT: 33 IU/L — AB (ref 0–32)
AST: 37 IU/L (ref 0–40)
Albumin/Globulin Ratio: 1.3 (ref 1.2–2.2)
Albumin: 4 g/dL (ref 3.5–5.5)
Alkaline Phosphatase: 56 IU/L (ref 39–117)
BUN/Creatinine Ratio: 11 (ref 9–23)
BUN: 9 mg/dL (ref 6–24)
CHLORIDE: 101 mmol/L (ref 96–106)
CO2: 23 mmol/L (ref 20–29)
CREATININE: 0.84 mg/dL (ref 0.57–1.00)
Calcium: 9.1 mg/dL (ref 8.7–10.2)
GFR calc non Af Amer: 86 mL/min/{1.73_m2} (ref >59)
GFR, EST AFRICAN AMERICAN: 99 mL/min/{1.73_m2} (ref >59)
GLUCOSE: 88 mg/dL (ref 65–99)
Globulin, Total: 3 g/dL (ref 1.5–4.5)
Potassium: 3.4 mmol/L — ABNORMAL LOW (ref 3.5–5.2)
Sodium: 142 mmol/L (ref 134–144)
TOTAL PROTEIN: 7 g/dL (ref 6.0–8.5)

## 2018-04-08 LAB — CBC WITH DIFFERENTIAL/PLATELET
BASOS ABS: 0 10*3/uL (ref 0.0–0.2)
Basos: 1 %
EOS (ABSOLUTE): 0.3 10*3/uL (ref 0.0–0.4)
Eos: 6 %
HEMOGLOBIN: 13.1 g/dL (ref 11.1–15.9)
Hematocrit: 38.1 % (ref 34.0–46.6)
IMMATURE GRANS (ABS): 0 10*3/uL (ref 0.0–0.1)
Immature Granulocytes: 0 %
LYMPHS ABS: 1.3 10*3/uL (ref 0.7–3.1)
Lymphs: 30 %
MCH: 30 pg (ref 26.6–33.0)
MCHC: 34.4 g/dL (ref 31.5–35.7)
MCV: 87 fL (ref 79–97)
MONOS ABS: 0.7 10*3/uL (ref 0.1–0.9)
Monocytes: 15 %
NEUTROS ABS: 2.2 10*3/uL (ref 1.4–7.0)
Neutrophils: 48 %
Platelets: 233 10*3/uL (ref 150–450)
RBC: 4.36 x10E6/uL (ref 3.77–5.28)
RDW: 12.1 % — ABNORMAL LOW (ref 12.3–15.4)
WBC: 4.5 10*3/uL (ref 3.4–10.8)

## 2018-04-08 LAB — SPECIMEN STATUS REPORT

## 2018-04-08 LAB — CK: CK TOTAL: 578 U/L — AB (ref 24–173)

## 2018-04-08 NOTE — Telephone Encounter (Signed)
-----   Message from Bo Merino, MD sent at 04/08/2018 12:48 PM EDT ----- I discussed lab results with patient.  I also called Dr. Einar Gip who will work in patient soon to evaluate for elevated CK and chest pain.  Patient had an evaluation in the emergency room which was negative.  If the cause of his elevated CK is not expl ained then we may have to consider a muscle biopsy.  Please send a referral to Dr. Einar Gip as soon as possible.

## 2018-04-08 NOTE — Progress Notes (Signed)
Potassium borderline low.  Please forward results to PCP.  ALT borderline elevated.  We will discuss labs further at her appointment tomorrow.

## 2018-04-08 NOTE — Progress Notes (Signed)
I discussed lab results with patient.  I also called Dr. Einar Gip who will work in patient soon to evaluate for elevated CK and chest pain.  Patient had an evaluation in the emergency room which was negative.  If the cause of his elevated CK is not explained then we may have to consider a muscle biopsy.  Please send a referral to Dr. Einar Gip as soon as possible.

## 2018-04-09 ENCOUNTER — Encounter: Payer: Self-pay | Admitting: Physician Assistant

## 2018-04-09 ENCOUNTER — Ambulatory Visit: Payer: 59 | Admitting: Rheumatology

## 2018-04-09 ENCOUNTER — Encounter: Payer: Self-pay | Admitting: *Deleted

## 2018-04-09 VITALS — BP 126/84 | HR 96 | Resp 12 | Ht 65.0 in | Wt 175.8 lb

## 2018-04-09 DIAGNOSIS — R0781 Pleurodynia: Secondary | ICD-10-CM | POA: Diagnosis not present

## 2018-04-09 DIAGNOSIS — Z87898 Personal history of other specified conditions: Secondary | ICD-10-CM | POA: Diagnosis not present

## 2018-04-09 DIAGNOSIS — Z8719 Personal history of other diseases of the digestive system: Secondary | ICD-10-CM

## 2018-04-09 DIAGNOSIS — R5383 Other fatigue: Secondary | ICD-10-CM | POA: Diagnosis not present

## 2018-04-09 DIAGNOSIS — M0579 Rheumatoid arthritis with rheumatoid factor of multiple sites without organ or systems involvement: Secondary | ICD-10-CM | POA: Diagnosis not present

## 2018-04-09 DIAGNOSIS — R748 Abnormal levels of other serum enzymes: Secondary | ICD-10-CM

## 2018-04-09 DIAGNOSIS — Z8679 Personal history of other diseases of the circulatory system: Secondary | ICD-10-CM

## 2018-04-09 DIAGNOSIS — M797 Fibromyalgia: Secondary | ICD-10-CM

## 2018-04-09 DIAGNOSIS — Z79899 Other long term (current) drug therapy: Secondary | ICD-10-CM

## 2018-04-14 ENCOUNTER — Other Ambulatory Visit: Payer: Self-pay | Admitting: Rheumatology

## 2018-04-14 DIAGNOSIS — R748 Abnormal levels of other serum enzymes: Secondary | ICD-10-CM | POA: Diagnosis not present

## 2018-04-14 DIAGNOSIS — I10 Essential (primary) hypertension: Secondary | ICD-10-CM | POA: Diagnosis not present

## 2018-04-14 DIAGNOSIS — R0789 Other chest pain: Secondary | ICD-10-CM | POA: Diagnosis not present

## 2018-04-14 DIAGNOSIS — M0579 Rheumatoid arthritis with rheumatoid factor of multiple sites without organ or systems involvement: Secondary | ICD-10-CM | POA: Diagnosis not present

## 2018-04-14 NOTE — Telephone Encounter (Signed)
Last visit: 04/09/2018 Next visit: 05/06/2018  Okay to refill per Dr. Estanislado Pandy.

## 2018-04-17 ENCOUNTER — Ambulatory Visit: Payer: 59 | Admitting: Internal Medicine

## 2018-04-17 ENCOUNTER — Encounter: Payer: Self-pay | Admitting: Internal Medicine

## 2018-04-17 VITALS — Resp 16 | Ht 65.0 in | Wt 174.0 lb

## 2018-04-17 DIAGNOSIS — R091 Pleurisy: Secondary | ICD-10-CM | POA: Diagnosis not present

## 2018-04-17 DIAGNOSIS — R0602 Shortness of breath: Secondary | ICD-10-CM | POA: Diagnosis not present

## 2018-04-17 NOTE — Patient Instructions (Signed)
Your lungs appear normal, will get a lung function test to assure that your lung function is normal.  Call us back if the pain returns.

## 2018-04-17 NOTE — Progress Notes (Signed)
[facility] Consultation      Assessment and Plan:  Pleurisy/Pleuritic chest pain.  --Elevated CK, uncertain that this is related.  --Her chest pain appears to have resolved, will continue to monitor, she is asked to call us back if her symptoms of pleurisy return. - We will check a complete pulmonary function test to look for evidence of lung disease.  Patient can call us when she has had the test performed for results.  She can otherwise follow-up with [place] on an as-needed basis.  Orders Placed This Encounter  Procedures  . Pulmonary Function Test [facility] Only    Standing Status:   Future    Standing Expiration Date:   04/18/2019    Order Specific Question:   Full PFT: includes the following: basic spirometry, spirometry pre & post bronchodilator, diffusion capacity (DLCO), lung volumes    Answer:   Full PFT    Order Specific Question:   Diffusion capacity (DLCO)    Answer:   Yes    Order Specific Question:   Lung volumes    Answer:   Yes    Order Specific Question:   This test can only be performed at    Answer:   [facility]      Date: 04/17/2018  MRN# [id number] Karen Burton 19-Dec-1975    Karen Burton is a 42 y.o. old female seen in consultation for chief complaint of:    Chief Complaint  Patient presents with  . Consult    referred by Hazel Sams for eval pleuritic chest pain x 4-5 times last month and once in Sept.    HPI:  She went to the hospital about a month ago because of chest pain when she took a deep breath. It persisted, she went to the ED, the chest wall was non-tender. Workup at that time showed negative D-Dimer, and troponins, however her CK was elevated at 679. Since that time it has been persistently elevated above 500. She has RA and was taking Arava, but stopped at that time. She takes Golimuamab injection. The chest pain resolved about 2 days after the ED visit.  She has not had persistent pain since that time.  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  She denies reflux, she has sinus drainage. She has a dog, and sleep with her.  She has no dyspnea, and is not limited in her ADL's by dyspnea. She can not take a deep breath without discomfort.   **CBC 04/04/2018>> absolute eosinophil count 300. **CXR 03/04/18>>imaging personally reviewed; mild chronic bronchitis, otherwise unremarkable.   PMHX:   Past Medical History:  Diagnosis Date  . Anemia   . Arthritis   . Dysmenorrhea 12/20/2015  . Eczema    mild  . Fibroids 12/20/2015  . Fibromyalgia   . Menorrhagia with regular cycle 12/20/2015  . Rash   . Reflux   . Rhinitis    chonic  . Vitamin D deficiency   . Vulvar itching 12/20/2015   Surgical Hx:  Past Surgical History:  Procedure Laterality Date  . ESOPHAGOGASTRODUODENOSCOPY ENDOSCOPY    . IR GENERIC HISTORICAL  02/07/2016   IR RADIOLOGIST EVAL & MGMT 02/07/2016 [name], MD [facility] INTERV RAD  . WISDOM TOOTH EXTRACTION     Family Hx:  Family History  Problem Relation Age of Onset  . Diabetes Mother   . Hypertension Mother   . Asthma Mother   . Cancer Mother  breast  . Thyroid disease Mother   . Arthritis Mother   . Graves' disease Mother   . Cancer Maternal Grandmother        pancreatic   Social Hx:   Social History   Tobacco Use  . Smoking status: Never Smoker  . Smokeless tobacco: Never Used  Substance Use Topics  . Alcohol use: No  . Drug use: Never   Medication:    Current Outpatient Medications:  .  acetaminophen (TYLENOL) 500 MG tablet, Take 1,000 mg by mouth as needed., Disp: , Rfl:  .  amLODipine (NORVASC) 5 MG tablet, Take 1 tablet (5 mg total) by mouth daily. for blood pressure, Disp: 30 tablet, Rfl: 5 .  cetirizine (ZYRTEC) 10 MG tablet, Take 10 mg by mouth daily., Disp: , Rfl:  .  diphenhydrAMINE (BENADRYL) 25 MG tablet, Take 25 mg by mouth every 6 (six) hours as needed., Disp: , Rfl:  .  docusate sodium (COLACE) 100 MG capsule, Take 100 mg by  mouth daily. , Disp: , Rfl:  .  DULoxetine (CYMBALTA) 60 MG capsule, TAKE 1 CAPSULE BY MOUTH ONCE DAILY, Disp: 30 capsule, Rfl: 1 .  ferrous sulfate 325 (65 FE) MG tablet, Take 325 mg by mouth daily with breakfast. , Disp: , Rfl:  .  Golimumab (SIMPONI) 50 MG/0.5ML SOAJ, INJECT 50MG  UNDER THE SKIN ONCE EVERY 30 DAYS, Disp: 1.5 mL, Rfl: 0 .  ibuprofen (ADVIL,MOTRIN) 200 MG tablet, Take 200 mg by mouth as needed., Disp: , Rfl:  .  JUNEL FE 24 1-20 MG-MCG(24) tablet, Take 1 tablet by mouth daily., Disp: 3 Package, Rfl: 4 .  Multiple Vitamins-Minerals (MULTIVITAMIN WOMEN PO), Take by mouth daily., Disp: , Rfl:  .  naproxen sodium (ANAPROX) 220 MG tablet, Take 220 mg by mouth as needed. , Disp: , Rfl:  .  losartan-hydrochlorothiazide (HYZAAR) 50-12.5 MG tablet, Take 1 tablet by mouth daily., Disp: , Rfl: 2   Allergies:  Ceftin [cefuroxime axetil]; Humira [adalimumab]; and Methotrexate derivatives  Review of Systems: Gen:  Denies  fever, sweats, chills HEENT: Denies blurred vision, double vision. bleeds, sore throat Cvc:  No dizziness, chest pain. Resp:   Denies cough or sputum production, shortness of breath Gi: Denies swallowing difficulty, stomach pain. Gu:  Denies bladder incontinence, burning urine Ext:   No Joint pain, stiffness. Skin: No skin rash,  hives  Endoc:  No polyuria, polydipsia. Psych: No depression, insomnia. Other:  All other systems were reviewed with the patient and were negative other that what is mentioned in the HPI.   Physical Examination:   VS: Resp 16   Ht 5\' 5"  (1.651 m)   Wt 174 lb (78.9 kg)   BMI 28.96 kg/m   General Appearance: No distress  Neuro:without focal findings,  speech normal,  HEENT: PERRLA, EOM intact.   Pulmonary: normal breath sounds, No wheezing.  CardiovascularNormal S1,S2.  No m/r/g.   Abdomen: Benign, Soft, non-tender. Renal:  No costovertebral tenderness  GU:  No performed at this time. Endoc: No evident thyromegaly, no signs of  acromegaly. Skin:   warm, no rashes, no ecchymosis  Extremities: normal, no cyanosis, clubbing.  Other findings:    LABORATORY PANEL:   CBC No results for input(s): WBC, HGB, HCT, PLT in the last 168 hours. ------------------------------------------------------------------------------------------------------------------  Chemistries  No results for input(s): NA, K, CL, CO2, GLUCOSE, BUN, CREATININE, CALCIUM, MG, AST, ALT, ALKPHOS, BILITOT in the last 168 hours.  Invalid input(s): GFRCGP ------------------------------------------------------------------------------------------------------------------  Cardiac Enzymes No results for  input(s): TROPONINI in the last 168 hours. ------------------------------------------------------------  RADIOLOGY:  No results found.     Thank  you for the consultation and for allowing [facility] to assist in the care of your patient. Our recommendations are noted above.  Please contact us if we can be of further service.   Marda Stalker, M.D., F.C.C.P.  Board Certified in Internal Medicine, Pulmonary Medicine, [place], and Sleep Medicine.  [facility] Office Number: [phone]   04/17/2018

## 2018-04-22 NOTE — Progress Notes (Deleted)
Office Visit Note  Patient: Karen Burton             Date of Birth: [birth year]           MRN: [id number]             PCP: Mikey Kirschner, MD Referring: Mikey Kirschner, MD Visit Date: 05/06/2018 Occupation: @GUAROCC @  Subjective:  No chief complaint on file.   History of Present Illness: Karen Burton is a 42 y.o. female ***   Activities of Daily Living:  Patient reports morning stiffness for *** {minute/hour:19697}.   Patient {ACTIONS;DENIES/REPORTS:21021675::"Denies"} nocturnal pain.  Difficulty dressing/grooming: {ACTIONS;DENIES/REPORTS:21021675::"Denies"} Difficulty climbing stairs: {ACTIONS;DENIES/REPORTS:21021675::"Denies"} Difficulty getting out of chair: {ACTIONS;DENIES/REPORTS:21021675::"Denies"} Difficulty using hands for taps, buttons, cutlery, and/or writing: {ACTIONS;DENIES/REPORTS:21021675::"Denies"}  No Rheumatology ROS completed.   PMFS History:  Patient Active Problem List   Diagnosis Date Noted  . Encounter for surveillance of contraceptive pills 08/12/2017  . Encounter for well woman exam with routine gynecological exam 08/12/2017  . ANA positive 11/06/2016  . Fibromyalgia 11/06/2016  . Menorrhagia with regular cycle 12/20/2015  . Dysmenorrhea 12/20/2015  . Uterine leiomyoma 12/20/2015  . Vulvar itching 12/20/2015  . Anemia 12/20/2015  . Essential hypertension 01/12/2013  . Esophageal reflux 01/12/2013  . Rheumatoid arthritis ([place]) 01/12/2013  . Rash and nonspecific skin eruption 01/12/2013    Past Medical History:  Diagnosis Date  . Anemia   . Arthritis   . Dysmenorrhea 12/20/2015  . Eczema    mild  . Fibroids 12/20/2015  . Fibromyalgia   . Menorrhagia with regular cycle 12/20/2015  . Rash   . Reflux   . Rhinitis    chonic  . Vitamin D deficiency   . Vulvar itching 12/20/2015    Family History  Problem Relation Age of Onset  . Diabetes Mother   . Hypertension Mother   . Asthma Mother   . Cancer Mother        breast  .  Thyroid disease Mother   . Arthritis Mother   . Graves' disease Mother   . Cancer Maternal Grandmother        pancreatic   Past Surgical History:  Procedure Laterality Date  . ESOPHAGOGASTRODUODENOSCOPY ENDOSCOPY    . IR GENERIC HISTORICAL  02/07/2016   IR RADIOLOGIST EVAL & MGMT 02/07/2016 Karen Mariscal, MD [facility] INTERV RAD  . WISDOM TOOTH EXTRACTION     Social History   Social History Narrative  . Not on file    Objective: Vital Signs: There were no vitals taken for this visit.   Physical Exam   Musculoskeletal Exam: ***  CDAI Exam: CDAI Score: Not documented Patient Global Assessment: Not documented; Provider Global Assessment: Not documented Swollen: Not documented; Tender: Not documented Joint Exam   Not documented   There is currently no information documented on the homunculus. Go to the Rheumatology activity and complete the homunculus joint exam.  Investigation: No additional findings.  Imaging: No results found.  Recent Labs: Lab Results  Component Value Date   WBC 4.5 04/04/2018   HGB 13.1 04/04/2018   PLT 233 04/04/2018   NA 142 04/04/2018   K 3.4 (L) 04/04/2018   CL 101 04/04/2018   CO2 23 04/04/2018   GLUCOSE 88 04/04/2018   BUN 9 04/04/2018   CREATININE 0.84 04/04/2018   BILITOT <0.2 04/04/2018   ALKPHOS 56 04/04/2018   AST 37 04/04/2018   ALT 33 (H) 04/04/2018   PROT 7.0 04/04/2018   ALBUMIN 4.0 04/04/2018  CALCIUM 9.1 04/04/2018   GFRAA 99 04/04/2018   QFTBGOLDPLUS NEGATIVE 07/12/2017    Speciality Comments: No specialty comments available.  Procedures:  No procedures performed Allergies: Ceftin [cefuroxime axetil]; Humira [adalimumab]; and Methotrexate derivatives   Assessment / Plan:     Visit Diagnoses: Rheumatoid arthritis involving multiple sites with positive rheumatoid factor (HCC) - Positive RF, negative anti-CCP, positive ANA  High risk medication use  Elevated CK - Myositis panel negative, aldolase WNL.  CK is 578.  She has 5/5 muscle strength of upper and lower extremities.    Pleuritic chest pain - Dr. Einar Gip  Fibromyalgia  Other fatigue  History of insomnia  History of hypertension  History of gastroesophageal reflux (GERD)   Orders: No orders of the defined types were placed in this encounter.  No orders of the defined types were placed in this encounter.   Face-to-face time spent with patient was *** minutes. Greater than 50% of time was spent in counseling and coordination of care.  Follow-Up Instructions: No follow-ups on file.   Ofilia Neas, PA-C  Note - This record has been created using Dragon software.  Chart creation errors have been sought, but may not always  have been located. Such creation errors do not reflect on  the standard of medical care.

## 2018-04-25 DIAGNOSIS — R0789 Other chest pain: Secondary | ICD-10-CM | POA: Diagnosis not present

## 2018-05-01 ENCOUNTER — Ambulatory Visit: Payer: 59 | Attending: Internal Medicine

## 2018-05-01 DIAGNOSIS — R0602 Shortness of breath: Secondary | ICD-10-CM | POA: Insufficient documentation

## 2018-05-01 DIAGNOSIS — R091 Pleurisy: Secondary | ICD-10-CM | POA: Insufficient documentation

## 2018-05-01 MED ORDER — ALBUTEROL SULFATE (2.5 MG/3ML) 0.083% IN NEBU
2.5000 mg | INHALATION_SOLUTION | Freq: Once | RESPIRATORY_TRACT | Status: AC
  Administered 2018-05-01: 2.5 mg via RESPIRATORY_TRACT
  Filled 2018-05-01: qty 3

## 2018-05-02 DIAGNOSIS — I10 Essential (primary) hypertension: Secondary | ICD-10-CM | POA: Diagnosis not present

## 2018-05-02 DIAGNOSIS — M0579 Rheumatoid arthritis with rheumatoid factor of multiple sites without organ or systems involvement: Secondary | ICD-10-CM | POA: Diagnosis not present

## 2018-05-06 ENCOUNTER — Ambulatory Visit: Payer: 59 | Admitting: Physician Assistant

## 2018-05-07 MED FILL — SIMPONI 50 MG/0.5ML SOAJ: 50 | 28 days supply | Qty: 1 | Fill #2

## 2018-05-08 NOTE — Progress Notes (Signed)
Office Visit Note  Patient: Karen Burton             Date of Birth: 1976-06-15           MRN: [id number]             PCP: Mikey Kirschner, MD Referring: Mikey Kirschner, MD Visit Date: 05/22/2018 Occupation: @GUAROCC @  Subjective:  Right ankle pain   History of Present Illness: Karen Burton is a 42 y.o. female with history of seropositive rheumatoid arthritis and fibromyalgia.  Patient is on Simponi subcutaneous injections once monthly.  She denies any recent flares of rheumatoid arthritis.  She reports she is having some discomfort in her right ankle but denies any joint swelling.  She states that she continues to muscle aches and muscle tenderness due to fibromyalgia.  She denies any muscle weakness.  She states that she was evaluated by Dr. Einar Gip who did not feel the CK level is related to her heart.  She was also evaluated by pulmonology and all the testing came back normal per patient.  She denies any shortness of breath, chest pain, or pleuritic chest pain.  She states that she did stress test performed which caused muscle fatigue and muscle burning in her lower extremities when she was walking on the treadmill.   Activities of Daily Living:  Patient reports morning stiffness for 0 none.   Patient Denies nocturnal pain.  Difficulty dressing/grooming: Denies Difficulty climbing stairs: Reports Difficulty getting out of chair: Denies Difficulty using hands for taps, buttons, cutlery, and/or writing: Denies  Review of Systems  Constitutional: Positive for fatigue.  HENT: Positive for mouth dryness and nose dryness. Negative for mouth sores.   Eyes: Positive for dryness. Negative for pain and visual disturbance.  Respiratory: Negative for cough, hemoptysis, shortness of breath and difficulty breathing.   Cardiovascular: Negative for chest pain, palpitations, hypertension and swelling in legs/feet.  Gastrointestinal: Positive for constipation. Negative for blood in  stool and diarrhea.  Endocrine: Negative for increased urination.  Genitourinary: Negative for difficulty urinating and painful urination.  Musculoskeletal: Positive for arthralgias, joint pain, joint swelling and muscle tenderness. Negative for myalgias, muscle weakness, morning stiffness and myalgias.  Skin: Negative for color change, pallor, rash, hair loss, nodules/bumps, skin tightness, ulcers and sensitivity to sunlight.  Allergic/Immunologic: Negative for susceptible to infections.  Neurological: Negative for dizziness, headaches and weakness.  Hematological: Negative for bruising/bleeding tendency and swollen glands.  Psychiatric/Behavioral: Positive for sleep disturbance. Negative for depressed mood. The patient is not nervous/anxious.     PMFS History:  Patient Active Problem List   Diagnosis Date Noted  . Encounter for surveillance of contraceptive pills 08/12/2017  . Encounter for well woman exam with routine gynecological exam 08/12/2017  . ANA positive 11/06/2016  . Fibromyalgia 11/06/2016  . Menorrhagia with regular cycle 12/20/2015  . Dysmenorrhea 12/20/2015  . Uterine leiomyoma 12/20/2015  . Vulvar itching 12/20/2015  . Anemia 12/20/2015  . Essential hypertension 01/12/2013  . Esophageal reflux 01/12/2013  . Rheumatoid arthritis ([place]) 01/12/2013  . Rash and nonspecific skin eruption 01/12/2013    Past Medical History:  Diagnosis Date  . Anemia   . Arthritis   . Dysmenorrhea 12/20/2015  . Eczema    mild  . Fibroids 12/20/2015  . Fibromyalgia   . Menorrhagia with regular cycle 12/20/2015  . Rash   . Reflux   . Rhinitis    chonic  . Vitamin D deficiency   . Vulvar itching 12/20/2015  Family History  Problem Relation Age of Onset  . Diabetes Mother   . Hypertension Mother   . Asthma Mother   . Cancer Mother        breast  . Thyroid disease Mother   . Arthritis Mother   . Graves' disease Mother   . Cancer Maternal Grandmother        pancreatic    Past Surgical History:  Procedure Laterality Date  . ESOPHAGOGASTRODUODENOSCOPY ENDOSCOPY    . IR GENERIC HISTORICAL  02/07/2016   IR RADIOLOGIST EVAL & MGMT 02/07/2016 [name], MD [facility] INTERV RAD  . WISDOM TOOTH EXTRACTION     Social History   Social History Narrative  . Not on file    Objective: Vital Signs: BP 130/85 (BP Location: Left Arm, Patient Position: Sitting, Cuff Size: Normal)   Pulse 91   Resp 14   Ht 5\' 5"  (1.651 m)   Wt 177 lb 3.2 oz (80.4 kg)   LMP 05/18/2018   BMI 29.49 kg/m    Physical Exam  Constitutional: She is oriented to person, place, and time. She appears well-developed and well-nourished.  HENT:  Head: Normocephalic and atraumatic.  Eyes: Conjunctivae and EOM are normal.  Neck: Normal range of motion.  Cardiovascular: Normal rate, regular rhythm, normal heart sounds and intact distal pulses.  Pulmonary/Chest: Effort normal and breath sounds normal.  Abdominal: Soft. Bowel sounds are normal.  Lymphadenopathy:    She has no cervical adenopathy.  Neurological: She is alert and oriented to person, place, and time.  Skin: Skin is warm and dry. Capillary refill takes less than 2 seconds.  Psychiatric: She has a normal mood and affect. Her behavior is normal.  Nursing note and vitals reviewed.    Musculoskeletal Exam: C-spine, thoracic spine, lumbar spine good range of motion.  No midline spinal tenderness.  No SI joint tenderness.  Shoulder joints, elbow joints, wrist joints, MCPs, PIPs, DIPs good range of motion with no synovitis.  Hip joints, knee joints, ankle joints, MTPs, PIPs and DIPs good range of motion with no synovitis.  No warmth or effusion bilateral knee joints.  Tenderness of trochanteric bursa bilaterally.  She has generalized muscle aches and muscle tenderness.  CDAI Exam: CDAI Score: 1  Patient Global Assessment: 5 (mm); Provider Global Assessment: 5 (mm) Swollen: 0 ; Tender: 0  Joint Exam   Not documented   There is  currently no information documented on the homunculus. Go to the Rheumatology activity and complete the homunculus joint exam.  Investigation: No additional findings.  Imaging: No results found.  Recent Labs: Lab Results  Component Value Date   WBC 4.5 04/04/2018   HGB 13.1 04/04/2018   PLT 233 04/04/2018   NA 142 04/04/2018   K 3.4 (L) 04/04/2018   CL 101 04/04/2018   CO2 23 04/04/2018   GLUCOSE 88 04/04/2018   BUN 9 04/04/2018   CREATININE 0.84 04/04/2018   BILITOT <0.2 04/04/2018   ALKPHOS 56 04/04/2018   AST 37 04/04/2018   ALT 33 (H) 04/04/2018   PROT 7.0 04/04/2018   ALBUMIN 4.0 04/04/2018   CALCIUM 9.1 04/04/2018   GFRAA 99 04/04/2018   QFTBGOLDPLUS NEGATIVE 07/12/2017    Speciality Comments: No specialty comments available.  Procedures:  No procedures performed Allergies: Ceftin [cefuroxime axetil]; Humira [adalimumab]; and Methotrexate derivatives   Assessment / Plan:     Visit Diagnoses: Rheumatoid arthritis involving multiple sites with positive rheumatoid factor (HCC) -  Positive RF, negative anti-CCP, positive ANA:  She has no synovitis on exam.  She has not had any recent rheumatoid arthritis flares.  She has occasional discomfort in her right ankle joint but no synovitis or tenderness on exam today.  She is on Symphony subcutaneous injections once monthly.  She is clinically doing well at this time.  She will continue on this current treatment regimen.  She was advised to notify us if she develops increased joint pain or joint swelling.  She will follow up in the office in 5 months.   High risk medication use -  Simponi sq injections once a month. D/c Arava due to developing skin irritation but no rash evident.  CBC and CMP were drawn on 04/04/2018.  She will return for lab work in December and every 3 months.  Elevated CK: CK 131 04/16/18.  She continues to have muscle aches and muscle tenderness likely due to fibromyalgia.  She has no muscle weakness at this  time.  Pleuritic chest pain - Resolved.  She was evaluated by Dr. Einar Gip and Dr. Ashby Dawes.  According to Dr. Mathis Fare note on 04/17/18 her pleuritic chest pain had resolved at the time of the consultation. PFT was normal per patient.  She also had a stress test that was normal per patient. We will try to obtain results. CK was 131 on 04/16/18.   Fibromyalgia: She has generalized muscle aches muscle tenderness due to fibromyalgia.  She has no muscle weakness at this time.  She continues to have chronic fatigue.  She was encouraged to stay active and exercise on a regular basis.   Other fatigue: Chronic  History of insomnia: She has been sleeping well at night.    Other medical conditions are listed as follows:   History of hypertension  History of gastroesophageal reflux (GERD)  Menorrhagia with regular cycle   Orders: No orders of the defined types were placed in this encounter.  No orders of the defined types were placed in this encounter.     Follow-Up Instructions: Return in about 5 months (around 10/21/2018) for Rheumatoid arthritis, Fibromyalgia.   Ofilia Neas, PA-C  Note - This record has been created using Dragon software.  Chart creation errors have been sought, but may not always  have been located. Such creation errors do not reflect on  the standard of medical care.

## 2018-05-12 ENCOUNTER — Telehealth: Payer: Self-pay | Admitting: Internal Medicine

## 2018-05-12 NOTE — Telephone Encounter (Signed)
Please call regarding PFT results.

## 2018-05-15 DIAGNOSIS — R079 Chest pain, unspecified: Secondary | ICD-10-CM | POA: Diagnosis not present

## 2018-05-16 DIAGNOSIS — R0789 Other chest pain: Secondary | ICD-10-CM | POA: Diagnosis not present

## 2018-05-16 DIAGNOSIS — R9439 Abnormal result of other cardiovascular function study: Secondary | ICD-10-CM | POA: Diagnosis not present

## 2018-05-20 DIAGNOSIS — I10 Essential (primary) hypertension: Secondary | ICD-10-CM | POA: Diagnosis not present

## 2018-05-20 DIAGNOSIS — M0579 Rheumatoid arthritis with rheumatoid factor of multiple sites without organ or systems involvement: Secondary | ICD-10-CM | POA: Diagnosis not present

## 2018-05-20 DIAGNOSIS — R748 Abnormal levels of other serum enzymes: Secondary | ICD-10-CM | POA: Diagnosis not present

## 2018-05-20 DIAGNOSIS — R0789 Other chest pain: Secondary | ICD-10-CM | POA: Diagnosis not present

## 2018-05-22 ENCOUNTER — Encounter: Payer: Self-pay | Admitting: Physician Assistant

## 2018-05-22 ENCOUNTER — Ambulatory Visit: Payer: 59 | Admitting: Physician Assistant

## 2018-05-22 VITALS — BP 130/85 | HR 91 | Resp 14 | Ht 65.0 in | Wt 177.2 lb

## 2018-05-22 DIAGNOSIS — M0579 Rheumatoid arthritis with rheumatoid factor of multiple sites without organ or systems involvement: Secondary | ICD-10-CM

## 2018-05-22 DIAGNOSIS — Z87898 Personal history of other specified conditions: Secondary | ICD-10-CM | POA: Diagnosis not present

## 2018-05-22 DIAGNOSIS — Z79899 Other long term (current) drug therapy: Secondary | ICD-10-CM

## 2018-05-22 DIAGNOSIS — R0781 Pleurodynia: Secondary | ICD-10-CM

## 2018-05-22 DIAGNOSIS — Z8719 Personal history of other diseases of the digestive system: Secondary | ICD-10-CM

## 2018-05-22 DIAGNOSIS — N920 Excessive and frequent menstruation with regular cycle: Secondary | ICD-10-CM

## 2018-05-22 DIAGNOSIS — M797 Fibromyalgia: Secondary | ICD-10-CM | POA: Diagnosis not present

## 2018-05-22 DIAGNOSIS — R5383 Other fatigue: Secondary | ICD-10-CM | POA: Diagnosis not present

## 2018-05-22 DIAGNOSIS — R748 Abnormal levels of other serum enzymes: Secondary | ICD-10-CM | POA: Diagnosis not present

## 2018-05-22 DIAGNOSIS — Z8679 Personal history of other diseases of the circulatory system: Secondary | ICD-10-CM | POA: Diagnosis not present

## 2018-05-23 ENCOUNTER — Ambulatory Visit: Payer: 59 | Admitting: Physician Assistant

## 2018-05-23 ENCOUNTER — Telehealth: Payer: Self-pay

## 2018-05-23 NOTE — Telephone Encounter (Signed)
LM at home for patient to call for results.

## 2018-05-23 NOTE — Telephone Encounter (Signed)
Completely normal.

## 2018-05-26 NOTE — Telephone Encounter (Signed)
Called patient and left message with her mother. Normal test results PFT patient may call back if need more information.

## 2018-06-12 ENCOUNTER — Other Ambulatory Visit: Payer: Self-pay | Admitting: Rheumatology

## 2018-06-12 NOTE — Telephone Encounter (Signed)
Last Visit: 05/22/18 Next Visit: 10/23/18  Okay to refill per Dr. Estanislado Pandy

## 2018-06-13 ENCOUNTER — Other Ambulatory Visit: Payer: Self-pay | Admitting: Rheumatology

## 2018-06-13 ENCOUNTER — Other Ambulatory Visit: Payer: Self-pay | Admitting: Internal Medicine

## 2018-06-13 ENCOUNTER — Other Ambulatory Visit: Payer: Self-pay | Admitting: Pharmacist

## 2018-06-13 MED ORDER — GOLIMUMAB 50 MG/0.5ML ~~LOC~~ SOAJ
SUBCUTANEOUS | 0 refills | Status: DC
Start: 2018-06-13 — End: 2018-07-11

## 2018-06-13 MED FILL — SIMPONI 50 MG/0.5ML SOAJ: 50 | 28 days supply | Qty: 1 | Fill #0

## 2018-06-13 NOTE — Telephone Encounter (Signed)
Last Visit: 05/22/18 Next Visit: 10/23/18 Labs: 04/04/18 Potassium borderline low. ALT borderline elevated.  Tb Gold: 07/12/17 Neg   Okay to refill per Dr. Estanislado Pandy

## 2018-07-10 ENCOUNTER — Other Ambulatory Visit: Payer: Self-pay | Admitting: Internal Medicine

## 2018-07-11 ENCOUNTER — Other Ambulatory Visit: Payer: Self-pay | Admitting: Pharmacist

## 2018-07-11 MED ORDER — GOLIMUMAB 50 MG/0.5ML ~~LOC~~ SOAJ: SUBCUTANEOUS | 0 refills | Status: DC

## 2018-07-11 MED FILL — SIMPONI 50 MG/0.5ML SOAJ: 50 | 28 days supply | Qty: 1 | Fill #0

## 2018-07-21 ENCOUNTER — Encounter: Payer: Self-pay | Admitting: Rheumatology

## 2018-07-21 DIAGNOSIS — Z9225 Personal history of immunosupression therapy: Secondary | ICD-10-CM

## 2018-07-21 DIAGNOSIS — Z79899 Other long term (current) drug therapy: Secondary | ICD-10-CM

## 2018-08-04 DIAGNOSIS — Z79899 Other long term (current) drug therapy: Secondary | ICD-10-CM | POA: Diagnosis not present

## 2018-08-04 DIAGNOSIS — Z9225 Personal history of immunosupression therapy: Secondary | ICD-10-CM | POA: Diagnosis not present

## 2018-08-06 ENCOUNTER — Encounter: Payer: Self-pay | Admitting: Rheumatology

## 2018-08-07 LAB — QUANTIFERON-TB GOLD PLUS
QuantiFERON Mitogen Value: >10 IU/mL
QuantiFERON Nil Value: 0.03 IU/mL
QuantiFERON TB1 Ag Value: 0.03 IU/mL
QuantiFERON TB2 Ag Value: 0.03 IU/mL
QuantiFERON-TB Gold Plus: NEGATIVE

## 2018-08-07 LAB — CMP14+EGFR
ALT: 26 IU/L (ref 0–32)
AST: 25 IU/L (ref 0–40)
Albumin/Globulin Ratio: 1.5 (ref 1.2–2.2)
Albumin: 4.1 g/dL (ref 3.5–5.5)
Alkaline Phosphatase: 65 IU/L (ref 39–117)
BUN/Creatinine Ratio: 11 (ref 9–23)
BUN: 10 mg/dL (ref 6–24)
Bilirubin Total: <0.2 mg/dL (ref 0.0–1.2)
CO2: 25 mmol/L (ref 20–29)
CREATININE: 0.89 mg/dL (ref 0.57–1.00)
Calcium: 9.7 mg/dL (ref 8.7–10.2)
Chloride: 100 mmol/L (ref 96–106)
GFR calc Af Amer: 92 mL/min/{1.73_m2} (ref >59)
GFR calc non Af Amer: 80 mL/min/{1.73_m2} (ref >59)
Globulin, Total: 2.7 g/dL (ref 1.5–4.5)
Glucose: 92 mg/dL (ref 65–99)
Potassium: 4.3 mmol/L (ref 3.5–5.2)
Sodium: 139 mmol/L (ref 134–144)
Total Protein: 6.8 g/dL (ref 6.0–8.5)

## 2018-08-07 LAB — CBC WITH DIFFERENTIAL/PLATELET
Basophils Absolute: 0 10*3/uL (ref 0.0–0.2)
Basos: 1 %
EOS (ABSOLUTE): 0.1 10*3/uL (ref 0.0–0.4)
Eos: 2 %
Hematocrit: 38.3 % (ref 34.0–46.6)
Hemoglobin: 13.2 g/dL (ref 11.1–15.9)
Immature Grans (Abs): 0 10*3/uL (ref 0.0–0.1)
Immature Granulocytes: 0 %
LYMPHS ABS: 1.8 10*3/uL (ref 0.7–3.1)
Lymphs: 31 %
MCH: 30.3 pg (ref 26.6–33.0)
MCHC: 34.5 g/dL (ref 31.5–35.7)
MCV: 88 fL (ref 79–97)
MONOS ABS: 0.5 10*3/uL (ref 0.1–0.9)
Monocytes: 9 %
Neutrophils Absolute: 3.2 10*3/uL (ref 1.4–7.0)
Neutrophils: 57 %
Platelets: 255 10*3/uL (ref 150–450)
RBC: 4.36 x10E6/uL (ref 3.77–5.28)
RDW: 12.6 % (ref 11.7–15.4)
WBC: 5.7 10*3/uL (ref 3.4–10.8)

## 2018-08-08 ENCOUNTER — Ambulatory Visit: Payer: 59 | Admitting: Family Medicine

## 2018-08-15 ENCOUNTER — Encounter: Payer: Self-pay | Admitting: Family Medicine

## 2018-08-15 ENCOUNTER — Ambulatory Visit: Payer: 59 | Admitting: Family Medicine

## 2018-08-15 VITALS — BP 124/84 | Ht 65.0 in | Wt 182.0 lb

## 2018-08-15 DIAGNOSIS — J019 Acute sinusitis, unspecified: Secondary | ICD-10-CM | POA: Diagnosis not present

## 2018-08-15 DIAGNOSIS — I10 Essential (primary) hypertension: Secondary | ICD-10-CM | POA: Diagnosis not present

## 2018-08-15 DIAGNOSIS — J029 Acute pharyngitis, unspecified: Secondary | ICD-10-CM

## 2018-08-15 LAB — POCT RAPID STREP A (OFFICE): RAPID STREP A SCREEN: NEGATIVE

## 2018-08-15 MED ORDER — HYDROCHLOROTHIAZIDE 12.5 MG PO TABS: 12.5000 mg | ORAL_TABLET | Freq: Every day | ORAL | 1 refills | Status: DC

## 2018-08-15 MED ORDER — LOSARTAN POTASSIUM 50 MG PO TABS: 50.0000 mg | ORAL_TABLET | Freq: Every day | ORAL | 1 refills | Status: DC

## 2018-08-15 MED ORDER — AMLODIPINE BESYLATE 5 MG PO TABS: 5.0000 mg | ORAL_TABLET | Freq: Every day | ORAL | 1 refills | Status: DC

## 2018-08-15 MED ORDER — DOXYCYCLINE HYCLATE 100 MG PO TABS: 100.0000 mg | ORAL_TABLET | Freq: Two times a day (BID) | ORAL | 0 refills | Status: DC

## 2018-08-15 NOTE — Progress Notes (Signed)
Subjective:    Patient ID: Karen Burton, female    DOB: 1975/08/25, 43 y.o.   MRN: [id number]  HPI  Patient is here today to follow up on her chronic health issues.   Hypertension: She has a history of Hypertension and is taking Norvasc 5 mg once per day and Losartan 50 mg once per day Hctz 12.5 once per day.  Reports compliance with medications.  Home blood pressure readings 423N systolically and in the 36R diastolically.  Denies any adverse effects.  She states a cardiologist placed her on the combination Losartan-Hctz 50-12.5 mg once per day,but no longer see him and will need someone to take over. Has been on this about 3 months. States cardiologist said all her testing was normal and only f/u prn.     Patient also with daily headaches, noticing once she gets going in the morning for the last 2-3 weeks, located frontally. No N/V. No photosensitivity. Reports sinus pressure. No nasal congestion, nostrils feel dry. Rhinorrhea at work. Today first day with sore throat. No cough. No fever.   Review of Systems  Constitutional: Negative for chills and fever.  HENT: Positive for rhinorrhea, sinus pressure and sore throat. Negative for congestion and ear pain.   Eyes: Negative for photophobia and visual disturbance.  Respiratory: Negative for cough, shortness of breath and wheezing.   Cardiovascular: Negative for chest pain and leg swelling.  Neurological: Positive for headaches.       Objective:   Physical Exam Vitals signs and nursing note reviewed.  Constitutional:      General: She is not in acute distress.    Appearance: She is well-developed. She is not toxic-appearing.  HENT:     Head: Normocephalic and atraumatic.     Right Ear: Tympanic membrane normal.     Left Ear: Tympanic membrane normal.     Nose: No congestion.     Right Sinus: Frontal sinus tenderness present. No maxillary sinus tenderness.     Left Sinus: Frontal sinus tenderness present. No maxillary sinus  tenderness.     Mouth/Throat:     Mouth: Mucous membranes are moist.     Pharynx: Oropharynx is clear.  Eyes:     General:        Right eye: No discharge.        Left eye: No discharge.  Neck:     Musculoskeletal: Neck supple. No neck rigidity.  Cardiovascular:     Rate and Rhythm: Normal rate and regular rhythm.     Heart sounds: Normal heart sounds.  Pulmonary:     Effort: Pulmonary effort is normal. No respiratory distress.     Breath sounds: Normal breath sounds. No wheezing or rales.  Lymphadenopathy:     Cervical: No cervical adenopathy.  Skin:    General: Skin is warm and dry.  Neurological:     Mental Status: She is alert and oriented to person, place, and time.           Assessment & Plan:  1. Essential hypertension HTN- Patient was seen today as part of a visit regarding hypertension. Discussed the importance of a healthy diet and regular physical activity, as well as the importance of compliance with medications.  Ideal goal is to keep blood pressure low, elevated levels certainly below 443/15 when possible.  The patient was counseled that keeping blood pressure under control will lessen the risk of complications. Low-salt diet such as DASH recommended, as well as regular physical activity. Patient  was advised to keep regular follow-ups.  Continue with current medications.  Refills given.  Follow-up in 6 months.  2. Acute rhinosinusitis Discussed likely post viral sinusitis.  Will treat with antibiotics.  Symptomatic care discussed.  Warning signs discussed.  Follow-up if symptoms worsen or fail to improve.  3. Sore throat - Plan: POCT rapid strep A, Strep A DNA probe Rapid strep negative today.  We will send backup and notify of results.

## 2018-08-16 LAB — SPECIMEN STATUS REPORT

## 2018-08-16 LAB — STREP A DNA PROBE: Strep Gp A Direct, DNA Probe: NEGATIVE

## 2018-08-18 ENCOUNTER — Other Ambulatory Visit: Payer: Self-pay | Admitting: Rheumatology

## 2018-08-18 NOTE — Telephone Encounter (Signed)
Last Visit: 05/22/18 Next Visit: 10/23/18  Okay to refill per Dr. Estanislado Pandy

## 2018-08-29 MED FILL — SIMPONI 50 MG/0.5ML SOAJ: 50 | 28 days supply | Qty: 1 | Fill #1

## 2018-10-08 ENCOUNTER — Other Ambulatory Visit: Payer: Self-pay | Admitting: Adult Health

## 2018-10-08 MED FILL — SIMPONI 50 MG/0.5ML SOAJ: 50 | 28 days supply | Qty: 1 | Fill #2

## 2018-10-14 ENCOUNTER — Encounter: Payer: Self-pay | Admitting: Rheumatology

## 2018-10-15 ENCOUNTER — Other Ambulatory Visit: Payer: Self-pay | Admitting: Rheumatology

## 2018-10-15 NOTE — Telephone Encounter (Signed)
Last Visit: 05/22/18 Next Visit: 10/23/18  Okay to refill per Dr. Estanislado Pandy

## 2018-10-21 ENCOUNTER — Telehealth: Payer: Self-pay | Admitting: *Deleted

## 2018-10-21 ENCOUNTER — Encounter: Payer: Self-pay | Admitting: Rheumatology

## 2018-10-21 ENCOUNTER — Other Ambulatory Visit: Payer: Self-pay | Admitting: *Deleted

## 2018-10-21 MED ORDER — GOLIMUMAB 50 MG/0.5ML ~~LOC~~ SOAJ: SUBCUTANEOUS | 0 refills | Status: DC

## 2018-10-21 NOTE — Telephone Encounter (Signed)
Patient requesting refill for Cymbalta and Simponi. Please advise.

## 2018-10-21 NOTE — Telephone Encounter (Signed)
Left message to advise patient prescription has been sent to the pharmacy. Advised patient we can cancel the follow up visit at this time and reschedule for a later date.

## 2018-10-21 NOTE — Telephone Encounter (Signed)
Last Visit: 05/22/18 Next Visit: 10/23/18 Labs: 08/04/18 WNL TB Gold: 08/04/18 Neg   Okay to refill per Dr. Estanislado Pandy

## 2018-10-21 NOTE — Telephone Encounter (Signed)
Prescription for Simponi was sent to the pharmacy today. Prescription for Cymbalta was sent in on 10/15/18 with one additional refill.

## 2018-10-23 ENCOUNTER — Ambulatory Visit: Payer: 59 | Admitting: Physician Assistant

## 2018-12-08 ENCOUNTER — Telehealth: Payer: Self-pay

## 2018-12-08 ENCOUNTER — Other Ambulatory Visit: Payer: Self-pay | Admitting: Rheumatology

## 2018-12-08 NOTE — Telephone Encounter (Signed)
Patient called back returning call. States someone had called her and was unable to answer and she is currently working. Would like a CB.  (Did not see a recent message)  CB [phone]

## 2018-12-08 NOTE — Telephone Encounter (Signed)
Please schedule patient for a follow up visit. Patient was due March 2020 Thanks!Karen Burton

## 2018-12-08 NOTE — Telephone Encounter (Signed)
LMOM for patient to call and schedule follow-up appointment.   °

## 2018-12-08 NOTE — Telephone Encounter (Signed)
Last Visit: 05/22/18 Next Visit: due March 2020. Message sent to the front to schedule patient.   Okay to refill per Dr. Estanislado Pandy

## 2018-12-10 NOTE — Progress Notes (Signed)
Office Visit Note  Patient: Karen Burton             Date of Birth: 07/21/1976           MRN: [id number]             PCP: Mikey Kirschner, MD Referring: Mikey Kirschner, MD Visit Date: 12/17/2018 Occupation: '@GUAROCC'$ @  Subjective:  Pain in both knee joints     History of Present Illness: Karen Burton is a 43 y.o. female with history of seropositive rheumatoid arthritis and fibromyalgia.  She is on Simponi sq injections every 30 days. She was due for her injection on 12/04/18 but she is out of simponi and her insurance requires a prior authorization. She is having tenderness in the left 3rd PIP joint pain.   She is having increased pain in both knee joints.  She denies any joint swelling.  She is having muscle aches and muscle tenderness due to fibromyalgia.  She is having trapezius muscle tension and tenderness.  She has chronic fatigue and daytime drowsiness.  She does not sleep well at night. She has tried taking melatonin 5 mg po at bedtime and benadryl 2 tablets.  She has difficulty falling asleep and staying asleep.    Activities of Daily Living:  Patient reports morning stiffness for 10 minutes.   Patient Denies nocturnal pain.  Difficulty dressing/grooming: Denies Difficulty climbing stairs: Reports Difficulty getting out of chair: Denies Difficulty using hands for taps, buttons, cutlery, and/or writing: Denies  Review of Systems  Constitutional: Positive for fatigue.  HENT: Negative for mouth sores, mouth dryness and nose dryness.   Eyes: Negative for pain, itching, visual disturbance and dryness.  Respiratory: Negative for cough, hemoptysis, shortness of breath, wheezing and difficulty breathing.   Cardiovascular: Negative for chest pain, palpitations, hypertension and swelling in legs/feet.  Gastrointestinal: Positive for constipation. Negative for abdominal pain, blood in stool and diarrhea.  Endocrine: Negative for increased urination.  Genitourinary:  Negative for painful urination and pelvic pain.  Musculoskeletal: Positive for morning stiffness. Negative for arthralgias, joint pain, joint swelling, myalgias, muscle weakness, muscle tenderness and myalgias.  Skin: Negative for color change, pallor, rash, hair loss, nodules/bumps, redness, skin tightness, ulcers and sensitivity to sunlight.  Allergic/Immunologic: Negative for susceptible to infections.  Neurological: Positive for headaches. Negative for dizziness, numbness, memory loss and weakness.  Hematological: Negative for swollen glands.  Psychiatric/Behavioral: Positive for sleep disturbance. Negative for depressed mood and confusion. The patient is not nervous/anxious.     PMFS History:  Patient Active Problem List   Diagnosis Date Noted  . Encounter for surveillance of contraceptive pills 08/12/2017  . Encounter for well woman exam with routine gynecological exam 08/12/2017  . ANA positive 11/06/2016  . Fibromyalgia 11/06/2016  . Menorrhagia with regular cycle 12/20/2015  . Dysmenorrhea 12/20/2015  . Uterine leiomyoma 12/20/2015  . Vulvar itching 12/20/2015  . Anemia 12/20/2015  . Essential hypertension 01/12/2013  . Esophageal reflux 01/12/2013  . Rheumatoid arthritis ([place]) 01/12/2013  . Rash and nonspecific skin eruption 01/12/2013    Past Medical History:  Diagnosis Date  . Anemia   . Arthritis   . Dysmenorrhea 12/20/2015  . Eczema    mild  . Fibroids 12/20/2015  . Fibromyalgia   . Menorrhagia with regular cycle 12/20/2015  . Rash   . Reflux   . Rhinitis    chonic  . Vitamin D deficiency   . Vulvar itching 12/20/2015    Family History  Problem  Relation Age of Onset  . Diabetes Mother   . Hypertension Mother   . Asthma Mother   . Cancer Mother        breast  . Thyroid disease Mother   . Arthritis Mother   . Graves' disease Mother   . Cancer Maternal Grandmother        pancreatic   Past Surgical History:  Procedure Laterality Date  .  ESOPHAGOGASTRODUODENOSCOPY ENDOSCOPY    . IR GENERIC HISTORICAL  02/07/2016   IR RADIOLOGIST EVAL & MGMT 02/07/2016 [name], MD [facility] INTERV RAD  . WISDOM TOOTH EXTRACTION     Social History   Social History Narrative  . Not on file   Immunization History  Administered Date(s) Administered  . DT 02/20/2013  . Influenza-Unspecified 07/18/2009, 05/16/2016, 06/03/2017, 04/07/2018  . Pneumococcal Polysaccharide-23 05/08/2007     Objective: Vital Signs: BP (!) 134/92 (BP Location: Right Arm, Patient Position: Sitting, Cuff Size: Normal)   Pulse 90   Resp 13   Ht '5\' 5"'$  (1.651 m)   Wt 186 lb 12.8 oz (84.7 kg)   BMI 31.09 kg/m    Physical Exam Vitals signs and nursing note reviewed.  Constitutional:      Appearance: She is well-developed.  HENT:     Head: Normocephalic and atraumatic.  Eyes:     Conjunctiva/sclera: Conjunctivae normal.  Neck:     Musculoskeletal: Normal range of motion.  Cardiovascular:     Rate and Rhythm: Normal rate and regular rhythm.     Heart sounds: Normal heart sounds.  Pulmonary:     Effort: Pulmonary effort is normal.     Breath sounds: Normal breath sounds.  Abdominal:     General: Bowel sounds are normal.     Palpations: Abdomen is soft.  Lymphadenopathy:     Cervical: No cervical adenopathy.  Skin:    General: Skin is warm and dry.     Capillary Refill: Capillary refill takes less than 2 seconds.  Neurological:     Mental Status: She is alert and oriented to person, place, and time.  Psychiatric:        Behavior: Behavior normal.      Musculoskeletal Exam: C-spine, thoracic spine, and lumbar spine good ROM.  No midline spinal tenderness.  No SI joint tenderness.  Shoulder joints, elbow joints, wrist joints, MCPs, PIPs, and DIPs good ROM with no synovitis.  Complete fist formation bilaterally.  Hip joints, knee joints, ankle joints, MTPs, PIPs, and DIPs good ROM with no synovitis.  No warmth or effusion of knee joints.  No tenderness or  swelling of ankle joints.    CDAI Exam: CDAI Score: Not documented Patient Global Assessment: Not documented; Provider Global Assessment: Not documented Swollen: Not documented; Tender: Not documented Joint Exam   Not documented   There is currently no information documented on the homunculus. Go to the Rheumatology activity and complete the homunculus joint exam.  Investigation: No additional findings.  Imaging: No results found.  Recent Labs: Lab Results  Component Value Date   WBC 5.7 08/04/2018   HGB 13.2 08/04/2018   PLT 255 08/04/2018   NA 139 08/04/2018   K 4.3 08/04/2018   CL 100 08/04/2018   CO2 25 08/04/2018   GLUCOSE 92 08/04/2018   BUN 10 08/04/2018   CREATININE 0.89 08/04/2018   BILITOT <0.2 08/04/2018   ALKPHOS 65 08/04/2018   AST 25 08/04/2018   ALT 26 08/04/2018   PROT 6.8 08/04/2018   ALBUMIN 4.1  08/04/2018   CALCIUM 9.7 08/04/2018   GFRAA 92 08/04/2018   QFTBGOLDPLUS Negative 08/04/2018    Speciality Comments: No specialty comments available.  Procedures:  No procedures performed Allergies: Ceftin [cefuroxime axetil]; Humira [adalimumab]; and Methotrexate derivatives   Assessment / Plan:     Visit Diagnoses: Rheumatoid arthritis involving multiple sites with positive rheumatoid factor ([place]): She has no synovitis on exam.  She has not had any recent flares.  She was due for her last Simponi sq injection on 12/04/18, but she ran out of the prescription and she could not have a refill until a prior authorization was performed.  She was provided a sample of Simponi today in the office and plans on injecting this afternoon.  She is clinically doing well on Simponi 50 mg sq injections every 30 days, and she will continue on this current treatment regimen.  We will work on the prior authorization.  She was advised to notify us if she develops increased joint pain or joint swelling.  She will follow up in 5 months.    High risk medication use -She is  currently taking Simponi 50 mg subcu every 30 days.  Last TB gold negative on 08/04/2018 and will monitor yearly.  Most recent CBC/CMP within normal limits on 08/04/2018.  Due for CBC/CMP today and will monitor every 3 months.  Standing orders placed.  She received her flu shot in September and previously Pneumovax 23. - Plan: CBC with Differential/Platelet, CBC with Differential/Platelet, COMPLETE METABOLIC PANEL WITH GFR, OFB51+WCHE  Elevated CK: She has no muscle weakness or increased muscle tenderness at this time.  Fibromyalgia: She continues to have generalized muscle aches and muscle tenderness, especially in bilateral lower extremities.  She has trapezius muscle tension and muscle tenderness bilaterally.  She has chronic fatigue related to insomnia.  We discussed the importance of regular exercise and good sleep hygiene.  She will continue taking Cymbalta 60 mg po daily.    Other fatigue: She has chronic fatigue.  We discussed the importance of staying active and exercising on a regular basis.  We dicussed trying to split up exercise for 10 minutes TID so she does not overexert herself.    History of insomnia: She continues to have difficulty falling asleep and staying asleep.  She takes melatonin 5 mg po at bedtime and benadryl 2 tablets po at bedtime.  She does not experience nocturnal pain. Good sleep hygiene was discussed.  She requested a prescription for a sleep aid.  A prescription for trazodone 50 mg 1 tablet by mouth at bedtime was sent to the pharmacy.  She was advised to follow up with PCP if this is ineffective or she cannot tolerate it.   Other medical conditions are listed as follows:   History of hypertension  History of gastroesophageal reflux (GERD)   Orders: Orders Placed This Encounter  Procedures  . CBC with Differential/Platelet  . CBC with Differential/Platelet  . COMPLETE METABOLIC PANEL WITH GFR  . CMP14+EGFR   No orders of the defined types were placed in this  encounter.   Face-to-face time spent with patient was 25 minutes. Greater than 50% of time was spent in counseling and coordination of care.  Follow-Up Instructions: Return in about 5 months (around 05/19/2019) for Rheumatoid arthritis, Fibromyalgia.   Ofilia Neas, PA-C  Note - This record has been created using Dragon software.  Chart creation errors have been sought, but may not always  have been located. Such creation errors do not  reflect on  the standard of medical care.

## 2018-12-17 ENCOUNTER — Encounter: Payer: Self-pay | Admitting: Physician Assistant

## 2018-12-17 ENCOUNTER — Other Ambulatory Visit: Payer: Self-pay | Admitting: Pharmacist

## 2018-12-17 ENCOUNTER — Other Ambulatory Visit: Payer: Self-pay

## 2018-12-17 ENCOUNTER — Ambulatory Visit: Payer: 59 | Admitting: Physician Assistant

## 2018-12-17 ENCOUNTER — Telehealth: Payer: Self-pay | Admitting: Pharmacist

## 2018-12-17 VITALS — BP 134/92 | HR 90 | Resp 13 | Ht 65.0 in | Wt 186.8 lb

## 2018-12-17 DIAGNOSIS — Z87898 Personal history of other specified conditions: Secondary | ICD-10-CM

## 2018-12-17 DIAGNOSIS — Z8719 Personal history of other diseases of the digestive system: Secondary | ICD-10-CM | POA: Diagnosis not present

## 2018-12-17 DIAGNOSIS — Z8679 Personal history of other diseases of the circulatory system: Secondary | ICD-10-CM | POA: Diagnosis not present

## 2018-12-17 DIAGNOSIS — M797 Fibromyalgia: Secondary | ICD-10-CM

## 2018-12-17 DIAGNOSIS — Z79899 Other long term (current) drug therapy: Secondary | ICD-10-CM

## 2018-12-17 DIAGNOSIS — R5383 Other fatigue: Secondary | ICD-10-CM | POA: Diagnosis not present

## 2018-12-17 DIAGNOSIS — R748 Abnormal levels of other serum enzymes: Secondary | ICD-10-CM | POA: Diagnosis not present

## 2018-12-17 DIAGNOSIS — M0579 Rheumatoid arthritis with rheumatoid factor of multiple sites without organ or systems involvement: Secondary | ICD-10-CM

## 2018-12-17 MED ORDER — TRAZODONE HCL 50 MG PO TABS: 50.0000 mg | ORAL_TABLET | Freq: Every day | ORAL | 0 refills | Status: DC

## 2018-12-17 MED ORDER — GOLIMUMAB 50 MG/0.5ML ~~LOC~~ SOAJ: SUBCUTANEOUS | 0 refills | Status: DC

## 2018-12-17 NOTE — Progress Notes (Signed)
Medication Samples have been provided to the patient.  Drug name: Simponi     Strength: 50mg       Qty: 1 LOT: [id number]  Exp.Date: 11/2019  Dosing instructions: inject 50mg  into the skin every 30 days.   The patient has been instructed regarding the correct time, dose, and frequency of taking this medication, including desired effects and most common side effects.   Dorthy Hustead C Courteney Alderete 11:07 AM 12/17/2018

## 2018-12-17 NOTE — Telephone Encounter (Signed)
Received a Prior Authorization request from [facility] for Karen Burton. Authorization has been submitted to patient's insurance via Cover My Meds. Will update once we receive a response.

## 2018-12-17 NOTE — Patient Instructions (Signed)
Standing Labs We placed an order today for your standing lab work.    Please come back and get your standing labs in August and every 3 months   We have open lab Monday through Friday from 8:30-11:30 AM and 1:30-4:00 PM  at the office of Dr. Bo Merino.   You may experience shorter wait times on Monday and Friday afternoons. The office is located at [address] [place], [place], [place] [address] No appointment is necessary.   Labs are drawn by [facility].  You may receive a bill from [facility] for your lab work.  If you wish to have your labs drawn at another location, please call the office 24 hours in advance to send orders.  If you have any questions regarding directions or hours of operation,  please call [phone].   Just as a reminder please drink plenty of water prior to coming for your lab work. Thanks!

## 2018-12-18 LAB — CBC WITH DIFFERENTIAL/PLATELET
Absolute Monocytes: 555 cells/uL (ref 200–950)
Basophils Absolute: 20 cells/uL (ref 0–200)
Basophils Relative: 0.4 %
Eosinophils Absolute: 160 cells/uL (ref 15–500)
Eosinophils Relative: 3.2 %
HCT: 38.1 % (ref 35.0–45.0)
Hemoglobin: 13.4 g/dL (ref 11.7–15.5)
Lymphs Abs: 1150 cells/uL (ref 850–3900)
MCH: 30.9 pg (ref 27.0–33.0)
MCHC: 35.2 g/dL (ref 32.0–36.0)
MCV: 88 fL (ref 80.0–100.0)
MPV: 10.4 fL (ref 7.5–12.5)
Monocytes Relative: 11.1 %
Neutro Abs: 3115 cells/uL (ref 1500–7800)
Neutrophils Relative %: 62.3 %
Platelets: 262 10*3/uL (ref 140–400)
RBC: 4.33 10*6/uL (ref 3.80–5.10)
RDW: 12.7 % (ref 11.0–15.0)
Total Lymphocyte: 23 %
WBC: 5 10*3/uL (ref 3.8–10.8)

## 2018-12-18 LAB — COMPLETE METABOLIC PANEL WITH GFR
AG Ratio: 1.3 (calc) (ref 1.0–2.5)
ALT: 41 U/L — ABNORMAL HIGH (ref 6–29)
AST: 40 U/L — ABNORMAL HIGH (ref 10–30)
Albumin: 3.8 g/dL (ref 3.6–5.1)
Alkaline phosphatase (APISO): 58 U/L (ref 31–125)
BUN: 11 mg/dL (ref 7–25)
CO2: 27 mmol/L (ref 20–32)
Calcium: 9.1 mg/dL (ref 8.6–10.2)
Chloride: 103 mmol/L (ref 98–110)
Creat: 0.82 mg/dL (ref 0.50–1.10)
GFR, Est African American: 102 mL/min/{1.73_m2} (ref >=60)
GFR, Est Non African American: 88 mL/min/{1.73_m2} (ref >=60)
Globulin: 3 g/dL (calc) (ref 1.9–3.7)
Glucose, Bld: 98 mg/dL (ref 65–99)
Potassium: 4.2 mmol/L (ref 3.5–5.3)
Sodium: 139 mmol/L (ref 135–146)
Total Bilirubin: 0.3 mg/dL (ref 0.2–1.2)
Total Protein: 6.8 g/dL (ref 6.1–8.1)

## 2018-12-18 NOTE — Progress Notes (Signed)
CBC WNL.  LFTs are elevated.  Please advise patient to avoid NSAIDs, tylenol, and alcohol.  Please forward labs to PCP.

## 2018-12-19 ENCOUNTER — Encounter: Payer: Self-pay | Admitting: Rheumatology

## 2018-12-23 ENCOUNTER — Encounter: Payer: Self-pay | Admitting: *Deleted

## 2018-12-23 NOTE — Telephone Encounter (Signed)
Received a fax from [facility] regarding a prior authorization for [facility]. Authorization has been APPROVED from 12/17/2018 to 12/16/2019.   Left voicemail notifying patient of approval.  Will send document to scan center.    Authorization # [id number] Phone # [phone]

## 2019-01-07 ENCOUNTER — Other Ambulatory Visit: Payer: Self-pay | Admitting: Adult Health

## 2019-01-07 MED FILL — SIMPONI 50 MG/0.5ML SOAJ: 50 | 27 days supply | Qty: 1 | Fill #0

## 2019-01-27 MED FILL — SIMPONI 50 MG/0.5ML SOAJ: 50 | 27 days supply | Qty: 1 | Fill #1

## 2019-02-06 ENCOUNTER — Ambulatory Visit (INDEPENDENT_AMBULATORY_CARE_PROVIDER_SITE_OTHER): Payer: 59 | Admitting: Family Medicine

## 2019-02-06 ENCOUNTER — Other Ambulatory Visit: Payer: Self-pay

## 2019-02-06 DIAGNOSIS — F5101 Primary insomnia: Secondary | ICD-10-CM

## 2019-02-06 DIAGNOSIS — I10 Essential (primary) hypertension: Secondary | ICD-10-CM

## 2019-02-06 MED ORDER — AMLODIPINE BESYLATE 5 MG PO TABS: 5.0000 mg | ORAL_TABLET | Freq: Every day | ORAL | 1 refills | Status: DC

## 2019-02-06 MED ORDER — LOSARTAN POTASSIUM 50 MG PO TABS: 50.0000 mg | ORAL_TABLET | Freq: Every day | ORAL | 1 refills | Status: DC

## 2019-02-06 MED ORDER — HYDROCHLOROTHIAZIDE 12.5 MG PO TABS: 12.5000 mg | ORAL_TABLET | Freq: Every day | ORAL | 1 refills | Status: DC

## 2019-02-06 NOTE — Progress Notes (Addendum)
   Subjective:    Patient ID: Karen Burton, female    DOB: January 01, 1976, 43 y.o.   MRN: [id number] Audio only Hypertension This is a chronic problem. The current episode started more than 1 year ago. Treatments tried: norvasc, hctz, losartan. There are no compliance problems.     Patient been having trouble going to sleep and been using otc meds with some success  Virtual Visit via Video Note  I connected with Karen Burton on 02/06/19 at  9:30 AM EDT by a video enabled telemedicine application and verified that I am speaking with the correct person using two identifiers.  Location: Patient: home Provider: office   I discussed the limitations of evaluation and management by telemedicine and the availability of in person appointments. The patient expressed understanding and agreed to proceed.  History of Present Illness:    Observations/Objective:   Assessment and Plan:   Follow Up Instructions:    I discussed the assessment and treatment plan with the patient. The patient was provided an opportunity to ask questions and all were answered. The patient agreed with the plan and demonstrated an understanding of the instructions.   The patient was advised to call back or seek an in-person evaluation if the symptoms worsen or if the condition fails to improve as anticipated.  I provided 18 minutes of non-face-to-face time during this encounter.     Review of Systems No headache, no major weight loss or weight gain, no chest pain no back pain abdominal pain no change in bowel habits complete ROS otherwise negative     Objective:   Physical Exam Virtual       Assessment & Plan:  Impression hypertension.  Control discussed to maintain same meds  2.  Insomnia.  Discussed.  Will try increasing melatonin from 5 to 10 mg.  Also work more hard on exercise medications refilled diet exercise discussed follow-up in 6 months

## 2019-02-13 ENCOUNTER — Encounter: Payer: Self-pay | Admitting: Adult Health

## 2019-02-13 ENCOUNTER — Other Ambulatory Visit (HOSPITAL_COMMUNITY)
Admission: RE | Admit: 2019-02-13 | Discharge: 2019-02-13 | Disposition: A | Discharge status: Home / Self Care | Payer: 59 | Source: Ambulatory Visit | Attending: Adult Health | Admitting: Adult Health

## 2019-02-13 ENCOUNTER — Ambulatory Visit (INDEPENDENT_AMBULATORY_CARE_PROVIDER_SITE_OTHER): Payer: 59 | Admitting: Adult Health

## 2019-02-13 ENCOUNTER — Other Ambulatory Visit: Payer: Self-pay

## 2019-02-13 ENCOUNTER — Ambulatory Visit: Payer: 59 | Admitting: Family Medicine

## 2019-02-13 VITALS — BP 131/93 | HR 87 | Ht 65.0 in | Wt 193.3 lb

## 2019-02-13 DIAGNOSIS — Z01419 Encounter for gynecological examination (general) (routine) without abnormal findings: Secondary | ICD-10-CM | POA: Insufficient documentation

## 2019-02-13 DIAGNOSIS — I10 Essential (primary) hypertension: Secondary | ICD-10-CM

## 2019-02-13 DIAGNOSIS — Z1211 Encounter for screening for malignant neoplasm of colon: Secondary | ICD-10-CM

## 2019-02-13 DIAGNOSIS — Z3041 Encounter for surveillance of contraceptive pills: Secondary | ICD-10-CM

## 2019-02-13 DIAGNOSIS — D219 Benign neoplasm of connective and other soft tissue, unspecified: Secondary | ICD-10-CM

## 2019-02-13 DIAGNOSIS — Z1212 Encounter for screening for malignant neoplasm of rectum: Secondary | ICD-10-CM | POA: Diagnosis not present

## 2019-02-13 LAB — HEMOCCULT GUIAC POC 1CARD (OFFICE): Fecal Occult Blood, POC: NEGATIVE

## 2019-02-13 MED ORDER — JUNEL FE 24 1-20 MG-MCG(24) PO TABS: ORAL_TABLET | ORAL | 4 refills | Status: DC

## 2019-02-13 NOTE — Progress Notes (Signed)
Patient ID: ECHO PROPP, female   DOB: Apr 21, 1976, 43 y.o.   MRN: [id number] History of Present Illness: Karen Burton is a 43 year old black female, single, G1P0 in for well woman gyn exam and pap. She is LPN.  PCP is Dr Mickie Hillier.  Current Medications, Allergies, Past Medical History, Past Surgical History, Family History and Social History were reviewed in [employer] record.     Review of Systems: Patient denies any headaches, hearing loss,  blurred vision, shortness of breath, chest pain, abdominal pain, problems with  urination, or intercourse(not currently active). No joint pain or mood swings. Has constipation, chronic Feels like stomach getting bigger Does not sleep well and feels tired at times.  Periods light with OCs   Physical Exam:BP (!) 131/93 (BP Location: Left Arm, Patient Position: Sitting, Cuff Size: Normal)   Pulse 87   Ht 5\' 5"  (1.651 m)   Wt 193 lb 4.8 oz (87.7 kg)   LMP 02/07/2019   BMI 32.17 kg/m  General:  Well developed, well nourished, no acute distress Skin:  Warm and dry Neck:  Midline trachea, normal thyroid, good ROM, no lymphadenopathy Lungs; Clear to auscultation bilaterally Breast:  No dominant palpable mass, retraction, or nipple discharge Cardiovascular: Regular rate and rhythm Abdomen:  Soft, non tender, no hepatosplenomegaly Pelvic:  External genitalia is normal in appearance, no lesions.  The vagina is normal in appearance. Urethra has no lesions or masses. The cervix is smooth, pap with HPV and genotyping 16/18 performed.  Uterus is felt to be about 16 weeks in size, with anterior fibroid.  No adnexal masses or tenderness noted.Bladder is non tender, no masses felt. Rectal: Good sphincter tone, no polyps, or hemorrhoids felt.  Hemoccult negative. Extremities/musculoskeletal:  No swelling or varicosities noted, no clubbing or cyanosis Psych:  No mood changes, alert and cooperative,seems happy PHQ 9 is 0 Fall risk is  low. Examination chaperoned by Diona Fanti CMA.   Impression: 1. Encounter for gynecological examination with Papanicolaou smear of cervix   2. Screening for colorectal cancer   3. Fibroids   4. Encounter for surveillance of contraceptive pills   5. Essential hypertension       Plan: Pap with HPV and 16/18 genotyping sent  Meds ordered this encounter  Medications  . JUNEL FE 24 1-20 MG-MCG(24) tablet    Sig: TAKE 1 TABLET BY MOUTH ONCE DAILY    Dispense:  84 tablet    Refill:  4    Order Specific Question:   Supervising Provider    Answer:   Florian Buff [[id number]]  Return in about 3 weeks for GYN [place] to assess fibroids  Physical in 1 year Pap in 3 if normal

## 2019-02-13 NOTE — Addendum Note (Signed)
Addended by: Diona Fanti A on: 02/13/2019 01:42 PM   Modules accepted: Orders

## 2019-02-16 ENCOUNTER — Other Ambulatory Visit: Payer: Self-pay | Admitting: Rheumatology

## 2019-02-16 NOTE — Telephone Encounter (Signed)
Last Visit: 12/17/2018 Next Visit: 05/19/2019  Okay to refill per Dr. Estanislado Pandy.

## 2019-02-17 LAB — CYTOLOGY - PAP
Diagnosis: NEGATIVE
HPV: NOT DETECTED

## 2019-03-02 MED FILL — SIMPONI 50 MG/0.5ML SOAJ: 50 | 27 days supply | Qty: 1 | Fill #2

## 2019-03-06 ENCOUNTER — Other Ambulatory Visit: Payer: Self-pay | Admitting: Obstetrics & Gynecology

## 2019-03-06 DIAGNOSIS — D219 Benign neoplasm of connective and other soft tissue, unspecified: Secondary | ICD-10-CM

## 2019-03-09 ENCOUNTER — Ambulatory Visit (INDEPENDENT_AMBULATORY_CARE_PROVIDER_SITE_OTHER): Payer: 59

## 2019-03-09 ENCOUNTER — Other Ambulatory Visit: Payer: Self-pay

## 2019-03-09 DIAGNOSIS — D219 Benign neoplasm of connective and other soft tissue, unspecified: Secondary | ICD-10-CM | POA: Diagnosis not present

## 2019-03-09 NOTE — Progress Notes (Signed)
PELVIC US TA/TV: heterogeneous anteverted uterus with mult.fibroids (#1) anterior intramural fibroid   5.5 x 4.9 x 5.3 cm,(#2) right mid uterus subserosal fibroid 5.4 x 4.2 x 4.2 cm,(#3) posterior subserosal fibroid 4.4 x 3.4 x 4.7 cm,(#4) fundal subserosal fibroid 1.9 x 1.6 x 2.1 cm,normal ovaries bilat,no free fluid,EEC 9.6 mm

## 2019-03-13 ENCOUNTER — Telehealth: Payer: Self-pay | Admitting: Rheumatology

## 2019-03-13 ENCOUNTER — Encounter: Payer: Self-pay | Admitting: *Deleted

## 2019-03-13 NOTE — Telephone Encounter (Signed)
Patient left a voicemail stating she "has a couple of questions" and requested to speak with you directly.  Patient states she can be reached on her cell [phone] or work [phone]

## 2019-03-13 NOTE — Telephone Encounter (Signed)
Letter written and faxed.

## 2019-03-13 NOTE — Telephone Encounter (Signed)
Per Dr. Estanislado Pandy provided patient with note to not work on [place] testing sites.

## 2019-03-13 NOTE — Telephone Encounter (Signed)
Patient states she works within the [employer]. Patient states she has been asked to work at the [place]. 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 Patient is on Simponi. Patient would like to know if she should be working the [place] testing sites. Please advise.

## 2019-03-19 ENCOUNTER — Other Ambulatory Visit: Payer: Self-pay | Admitting: Physician Assistant

## 2019-03-19 NOTE — Telephone Encounter (Signed)
Last Visit: 12/17/2018 Next Visit: 05/19/2019  Okay to refill Trazodone?

## 2019-03-19 NOTE — Telephone Encounter (Signed)
Ok to refill 

## 2019-04-10 ENCOUNTER — Telehealth: Payer: Self-pay | Admitting: Rheumatology

## 2019-04-10 ENCOUNTER — Other Ambulatory Visit: Payer: Self-pay | Admitting: Rheumatology

## 2019-04-10 ENCOUNTER — Other Ambulatory Visit: Payer: Self-pay | Admitting: *Deleted

## 2019-04-10 ENCOUNTER — Other Ambulatory Visit: Payer: Self-pay | Admitting: Pharmacist

## 2019-04-10 ENCOUNTER — Other Ambulatory Visit: Payer: Self-pay

## 2019-04-10 DIAGNOSIS — Z79899 Other long term (current) drug therapy: Secondary | ICD-10-CM

## 2019-04-10 MED ORDER — SIMPONI 50 MG/0.5ML ~~LOC~~ SOAJ: SUBCUTANEOUS | 0 refills | Status: DC

## 2019-04-10 NOTE — Telephone Encounter (Signed)
Last Visit: 12/17/2018 Next Visit: 05/19/2019 Labs: 12/17/18 CBC WNL. LFTs are elevated TB Gold: 08/04/18  Patient to update labs 04/10/19.

## 2019-04-10 NOTE — Telephone Encounter (Signed)
Patient needs refill on Simponi sent to [place]. Patient is due for next injection tomorrow, and not sure if she will have medicine by then. Could patient get a sample? Please call to advise.

## 2019-04-10 NOTE — Telephone Encounter (Signed)
Patient update labs 04/10/19.  Okay to refill per Dr. Estanislado Pandy

## 2019-04-10 NOTE — Telephone Encounter (Signed)
Spoke with patient and she is due to update labs. Patient will be provided with a sample when she comes to update labs.

## 2019-04-11 LAB — CBC WITH DIFFERENTIAL/PLATELET
Absolute Monocytes: 589 cells/uL (ref 200–950)
Basophils Absolute: 38 cells/uL (ref 0–200)
Basophils Relative: 0.7 %
Eosinophils Absolute: 119 cells/uL (ref 15–500)
Eosinophils Relative: 2.2 %
HCT: 37 % (ref 35.0–45.0)
Hemoglobin: 12.4 g/dL (ref 11.7–15.5)
Lymphs Abs: 1609 cells/uL (ref 850–3900)
MCH: 30.3 pg (ref 27.0–33.0)
MCHC: 33.5 g/dL (ref 32.0–36.0)
MCV: 90.5 fL (ref 80.0–100.0)
MPV: 10.5 fL (ref 7.5–12.5)
Monocytes Relative: 10.9 %
Neutro Abs: 3046 cells/uL (ref 1500–7800)
Neutrophils Relative %: 56.4 %
Platelets: 254 10*3/uL (ref 140–400)
RBC: 4.09 10*6/uL (ref 3.80–5.10)
RDW: 12.6 % (ref 11.0–15.0)
Total Lymphocyte: 29.8 %
WBC: 5.4 10*3/uL (ref 3.8–10.8)

## 2019-04-11 LAB — COMPLETE METABOLIC PANEL WITH GFR
AG Ratio: 1.5 (calc) (ref 1.0–2.5)
ALT: 23 U/L (ref 6–29)
AST: 25 U/L (ref 10–30)
Albumin: 3.8 g/dL (ref 3.6–5.1)
Alkaline phosphatase (APISO): 65 U/L (ref 31–125)
BUN: 10 mg/dL (ref 7–25)
CO2: 26 mmol/L (ref 20–32)
Calcium: 8.8 mg/dL (ref 8.6–10.2)
Chloride: 106 mmol/L (ref 98–110)
Creat: 0.79 mg/dL (ref 0.50–1.10)
GFR, Est African American: 106 mL/min/{1.73_m2} (ref >=60)
GFR, Est Non African American: 92 mL/min/{1.73_m2} (ref >=60)
Globulin: 2.6 g/dL (calc) (ref 1.9–3.7)
Glucose, Bld: 92 mg/dL (ref 65–99)
Potassium: 3.9 mmol/L (ref 3.5–5.3)
Sodium: 139 mmol/L (ref 135–146)
Total Bilirubin: 0.3 mg/dL (ref 0.2–1.2)
Total Protein: 6.4 g/dL (ref 6.1–8.1)

## 2019-04-13 NOTE — Progress Notes (Signed)
CBC WNL

## 2019-04-14 MED FILL — SIMPONI 50 MG/0.5ML SOAJ: 50 | 28 days supply | Qty: 1 | Fill #0

## 2019-04-17 ENCOUNTER — Other Ambulatory Visit: Payer: Self-pay | Admitting: Rheumatology

## 2019-04-17 NOTE — Telephone Encounter (Signed)
Last Visit: 12/17/2018 Next Visit: 05/19/2019  Okay to refill per Dr. Deveshwar.  

## 2019-05-05 NOTE — Progress Notes (Signed)
Office Visit Note  Patient: Karen Burton             Date of Birth: 1975-12-16           MRN: [id number]             PCP: Mikey Kirschner, MD Referring: Mikey Kirschner, MD Visit Date: 05/19/2019 Occupation: @GUAROCC @  Subjective:  Pain in knee joints.   History of Present Illness: Karen Burton is a 43 y.o. female with history of positive rheumatoid arthritis.  She states she continues to have some discomfort in her knee joints.  She has difficulty with climbing stairs.  She denies any joint swelling.  She has lower back pain off and on without any radiculopathy.  She also has been experiencing fatigue.  She believes states related to fibromyalgia.  Activities of Daily Living:  Patient reports morning stiffness for 20 minutes.   Patient Reports nocturnal pain.  Difficulty dressing/grooming: Denies Difficulty climbing stairs: Reports Difficulty getting out of chair: Reports Difficulty using hands for taps, buttons, cutlery, and/or writing: Denies  Review of Systems  Constitutional: Positive for fatigue. Negative for night sweats, weight gain and weight loss.  HENT: Positive for mouth dryness and nose dryness. Negative for mouth sores, trouble swallowing and trouble swallowing.        Nose sores  Eyes: Positive for dryness. Negative for pain, redness and visual disturbance.  Respiratory: Negative for cough, shortness of breath and difficulty breathing.   Cardiovascular: Negative for chest pain, palpitations, hypertension, irregular heartbeat and swelling in legs/feet.  Gastrointestinal: Positive for constipation. Negative for blood in stool and diarrhea.  Endocrine: Negative for increased urination.  Genitourinary: Negative for difficulty urinating, painful urination and vaginal dryness.  Musculoskeletal: Positive for arthralgias, joint pain and morning stiffness. Negative for joint swelling, myalgias, muscle weakness, muscle tenderness and myalgias.  Skin: Negative  for color change, rash, hair loss, skin tightness, ulcers and sensitivity to sunlight.  Allergic/Immunologic: Negative for susceptible to infections.  Neurological: Negative for dizziness, light-headedness, numbness, headaches, memory loss, night sweats and weakness.  Hematological: Negative for swollen glands.  Psychiatric/Behavioral: Positive for sleep disturbance. Negative for depressed mood and confusion. The patient is not nervous/anxious.     PMFS History:  Patient Active Problem List   Diagnosis Date Noted  . Screening for colorectal cancer 02/13/2019  . Encounter for gynecological examination with Papanicolaou smear of cervix 02/13/2019  . Fibroids 02/13/2019  . Encounter for surveillance of contraceptive pills 08/12/2017  . Encounter for well woman exam with routine gynecological exam 08/12/2017  . ANA positive 11/06/2016  . Fibromyalgia 11/06/2016  . Menorrhagia with regular cycle 12/20/2015  . Dysmenorrhea 12/20/2015  . Uterine leiomyoma 12/20/2015  . Vulvar itching 12/20/2015  . Anemia 12/20/2015  . Essential hypertension 01/12/2013  . Esophageal reflux 01/12/2013  . Rheumatoid arthritis ([place]) 01/12/2013  . Rash and nonspecific skin eruption 01/12/2013    Past Medical History:  Diagnosis Date  . Anemia   . Arthritis   . Dysmenorrhea 12/20/2015  . Eczema    mild  . Fibroids 12/20/2015  . Fibromyalgia   . Menorrhagia with regular cycle 12/20/2015  . Rash   . Reflux   . Rhinitis    chonic  . Vitamin D deficiency   . Vulvar itching 12/20/2015    Family History  Problem Relation Age of Onset  . Diabetes Mother   . Hypertension Mother   . Asthma Mother   . Cancer Mother  breast  . Thyroid disease Mother   . Arthritis Mother   . Graves' disease Mother   . Cancer Maternal Grandmother        pancreatic   Past Surgical History:  Procedure Laterality Date  . ESOPHAGOGASTRODUODENOSCOPY ENDOSCOPY    . IR GENERIC HISTORICAL  02/07/2016   IR RADIOLOGIST EVAL  & MGMT 02/07/2016 [name], MD [facility] INTERV RAD  . WISDOM TOOTH EXTRACTION     Social History   Social History Narrative  . Not on file   Immunization History  Administered Date(s) Administered  . DT 02/20/2013  . Influenza-Unspecified 07/18/2009, 05/16/2016, 06/03/2017, 04/07/2018  . Pneumococcal Polysaccharide-23 05/08/2007     Objective: Vital Signs: BP 128/88 (BP Location: Left Arm, Patient Position: Sitting, Cuff Size: Normal)   Pulse 83   Resp 13   Ht 5\' 5"  (1.651 m)   Wt 201 lb 9.6 oz (91.4 kg)   BMI 33.55 kg/m    Physical Exam Vitals signs and nursing note reviewed.  Constitutional:      Appearance: She is well-developed.  HENT:     Head: Normocephalic and atraumatic.  Eyes:     Conjunctiva/sclera: Conjunctivae normal.  Neck:     Musculoskeletal: Normal range of motion.  Cardiovascular:     Rate and Rhythm: Normal rate and regular rhythm.     Heart sounds: Normal heart sounds.  Pulmonary:     Effort: Pulmonary effort is normal.     Breath sounds: Normal breath sounds.  Abdominal:     General: Bowel sounds are normal.     Palpations: Abdomen is soft.  Lymphadenopathy:     Cervical: No cervical adenopathy.  Skin:    General: Skin is warm and dry.     Capillary Refill: Capillary refill takes less than 2 seconds.  Neurological:     Mental Status: She is alert and oriented to person, place, and time.  Psychiatric:        Behavior: Behavior normal.      Musculoskeletal Exam: C-spine thoracic and lumbar spine with good range of motion.  She has good range of motion of her shoulder joints, elbow joints, wrist joints, MCPs PIPs and DIPs with no synovitis.  She has good range of motion of her hip joints.  She is crepitus in her knee joints.  Ankle joints MTPs PIPs been good range of motion with no synovitis.  CDAI Exam: CDAI Score: 1.6  Patient Global: 4 mm; Provider Global: 2 mm Swollen: 0 ; Tender: 1  Joint Exam      Right  Left  Knee   Tender         Investigation: No additional findings.  Imaging: Xr Foot 2 Views Left  Result Date: 05/19/2019 No MTP PIP or DIP joint space narrowing was noted.  No intertarsal or tibiotalar joint space narrowing was noted.  No erosive changes were noted. Impression: Unremarkable x-ray of the foot.  Xr Foot 2 Views Right  Result Date: 05/19/2019 No MTP, PIP or DIP narrowing was noted.  No intertarsal or tibiotalar joint space narrowing was noted.  No erosive changes were noted. Impression: Unremarkable x-ray of the foot.  Xr Hand 2 View Left  Result Date: 05/19/2019 Mild juxta-articular osteopenia was noted.  No MCP, PIP or DIP joint space narrowing was noted.  No intercarpal radiocarpal joint space narrowing was noted. Impression: Unremarkable x-ray of the hand.  Xr Hand 2 View Right  Result Date: 05/19/2019 No MCP PIP or DIP narrowing was noted.  No intercarpal or radiocarpal joint space narrowing was noted.  No erosive changes were noted.  Mild juxta-articular osteopenia was noted. Impression: Unremarkable x-ray of the hand.  Xr Knee 3 View Left  Result Date: 05/19/2019 No medial or lateral compartment narrowing was noted.  No chondrocalcinosis was noted.  No patellofemoral narrowing was noted. Impression: Unremarkable x-ray of the knee joint.  Xr Knee 3 View Right  Result Date: 05/19/2019 No medial lateral compartment narrowing was noted. No patellofemoral narrowing was noted.  No chondrocalcinosis was noted. Impression: Unremarkable x-ray of the knee joint.   Recent Labs: Lab Results  Component Value Date   WBC 5.4 04/10/2019   HGB 12.4 04/10/2019   PLT 254 04/10/2019   NA 139 04/10/2019   K 3.9 04/10/2019   CL 106 04/10/2019   CO2 26 04/10/2019   GLUCOSE 92 04/10/2019   BUN 10 04/10/2019   CREATININE 0.79 04/10/2019   BILITOT 0.3 04/10/2019   ALKPHOS 65 08/04/2018   AST 25 04/10/2019   ALT 23 04/10/2019   PROT 6.4 04/10/2019   ALBUMIN 4.1 08/04/2018   CALCIUM 8.8  04/10/2019   GFRAA 106 04/10/2019   QFTBGOLDPLUS Negative 08/04/2018    Speciality Comments: Prior therapy includes: methotrexate and Plaquenil (neutropenia) and Humira (painful injections)  Procedures:  No procedures performed Allergies: Ceftin [cefuroxime axetil], Humira [adalimumab], and Methotrexate derivatives   Assessment / Plan:     Visit Diagnoses: Rheumatoid arthritis involving multiple sites with positive rheumatoid factor ([place]) -patient had no synovitis on examination today.  She complains of some discomfort in her knee joints.  X-rays of bilateral hands and feet were obtained to monitor for disease progression.  No radiographic progression was noted.  Plan: XR Hand 2 View Right, XR Hand 2 View Left, XR Foot 2 Views Right, XR Foot 2 Views Left  High risk medication use -  Simponi 50 mg every 28 days. Last TB gold negative on 08/04/2018 and will monitor yearly.  Most recent CBC/CMP within normal limits on 04/10/2019 and will monitor every 3 months.  We will check TB Gold with her next labs.  Chronic pain of both knees -x-rays of her knee joints were unremarkable.  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  I have given her a handout on knee joint exercises.  Weight loss was also discussed.  Plan: XR KNEE 3 VIEW RIGHT, XR KNEE 3 VIEW LEFT  Fibromyalgia-she continues to have some generalized pain from fibromyalgia.  Other fatigue-she still has significant fatigue.  History of insomnia-good sleep hygiene was discussed.  Elevated CK  History of hypertension  History of gastroesophageal reflux (GERD)  Menorrhagia with regular cycle  Orders: Orders Placed This Encounter  Procedures  . XR Hand 2 View Right  . XR Hand 2 View Left  . XR KNEE 3 VIEW RIGHT  . XR Foot 2 Views Right  . XR Foot 2 Views Left  . XR KNEE 3 VIEW LEFT   No orders of the defined types were placed in this encounter.    Follow-Up Instructions: Return in about 5  months (around 10/17/2019) for Rheumatoid arthritis.   Bo Merino, MD  Note - This record has been created using Editor, commissioning.  Chart creation errors have been sought, but may not always  have been located. Such creation errors do not reflect on  the standard of medical care.

## 2019-05-19 ENCOUNTER — Ambulatory Visit (INDEPENDENT_AMBULATORY_CARE_PROVIDER_SITE_OTHER): Payer: 59

## 2019-05-19 ENCOUNTER — Encounter: Payer: Self-pay | Admitting: Physician Assistant

## 2019-05-19 ENCOUNTER — Ambulatory Visit: Payer: 59 | Admitting: Rheumatology

## 2019-05-19 ENCOUNTER — Other Ambulatory Visit: Payer: Self-pay

## 2019-05-19 ENCOUNTER — Ambulatory Visit: Payer: Self-pay

## 2019-05-19 VITALS — BP 128/88 | HR 83 | Resp 13 | Ht 65.0 in | Wt 201.6 lb

## 2019-05-19 DIAGNOSIS — M0579 Rheumatoid arthritis with rheumatoid factor of multiple sites without organ or systems involvement: Secondary | ICD-10-CM

## 2019-05-19 DIAGNOSIS — M25561 Pain in right knee: Secondary | ICD-10-CM

## 2019-05-19 DIAGNOSIS — R5383 Other fatigue: Secondary | ICD-10-CM | POA: Diagnosis not present

## 2019-05-19 DIAGNOSIS — Z8719 Personal history of other diseases of the digestive system: Secondary | ICD-10-CM | POA: Diagnosis not present

## 2019-05-19 DIAGNOSIS — M797 Fibromyalgia: Secondary | ICD-10-CM

## 2019-05-19 DIAGNOSIS — Z8679 Personal history of other diseases of the circulatory system: Secondary | ICD-10-CM | POA: Diagnosis not present

## 2019-05-19 DIAGNOSIS — R748 Abnormal levels of other serum enzymes: Secondary | ICD-10-CM

## 2019-05-19 DIAGNOSIS — Z87898 Personal history of other specified conditions: Secondary | ICD-10-CM

## 2019-05-19 DIAGNOSIS — M25562 Pain in left knee: Secondary | ICD-10-CM

## 2019-05-19 DIAGNOSIS — Z79899 Other long term (current) drug therapy: Secondary | ICD-10-CM

## 2019-05-19 DIAGNOSIS — G8929 Other chronic pain: Secondary | ICD-10-CM

## 2019-05-19 DIAGNOSIS — N920 Excessive and frequent menstruation with regular cycle: Secondary | ICD-10-CM

## 2019-05-19 NOTE — Patient Instructions (Addendum)
Journal for Nurse Practitioners, 15(4), 263-267. Retrieved May 05, 2018 from [email or web address].com/[email or web address]">  Knee Exercises Ask your health care provider which exercises are safe for you. Do exercises exactly as told by your health care provider and adjust them as directed. It is normal to feel mild stretching, pulling, tightness, or discomfort as you do these exercises. Stop right away if you feel sudden pain or your pain gets worse. Do not begin these exercises until told by your health care provider. Stretching and range-of-motion exercises These exercises warm up your muscles and joints and improve the movement and flexibility of your knee. These exercises also help to relieve pain and swelling. Knee extension, prone 1. Lie on your abdomen (prone position) on a bed. 2. Place your left / right knee just beyond the edge of the surface so your knee is not on the bed. You can put a towel under your left / right thigh just above your kneecap for comfort. 3. Relax your leg muscles and allow gravity to straighten your knee (extension). You should feel a stretch behind your left / right knee. 4. Hold this position for __________ seconds. 5. Scoot up so your knee is supported between repetitions. Repeat __________ times. Complete this exercise __________ times a day. Knee flexion, active  1. Lie on your back with both legs straight. If this causes back discomfort, bend your left / right knee so your foot is flat on the floor. 2. Slowly slide your left / right heel back toward your buttocks. Stop when you feel a gentle stretch in the front of your knee or thigh (flexion). 3. Hold this position for __________ seconds. 4. Slowly slide your left / right heel back to the starting position. Repeat __________ times. Complete this exercise __________ times a day. Quadriceps stretch, prone  1. Lie on your abdomen on a firm surface, such as a bed or padded floor. 2. Bend your left / right knee and hold  your ankle. If you cannot reach your ankle or pant leg, loop a belt around your foot and grab the belt instead. 3. Gently pull your heel toward your buttocks. Your knee should not slide out to the side. You should feel a stretch in the front of your thigh and knee (quadriceps). 4. Hold this position for __________ seconds. Repeat __________ times. Complete this exercise __________ times a day. Hamstring, supine 1. Lie on your back (supine position). 2. Loop a belt or towel over the ball of your left / right foot. The ball of your foot is on the walking surface, right under your toes. 3. Straighten your left / right knee and slowly pull on the belt to raise your leg until you feel a gentle stretch behind your knee (hamstring). ? Do not let your knee bend while you do this. ? Keep your other leg flat on the floor. 4. Hold this position for __________ seconds. Repeat __________ times. Complete this exercise __________ times a day. Strengthening exercises These exercises build strength and endurance in your knee. Endurance is the ability to use your muscles for a long time, even after they get tired. Quadriceps, isometric This exercise stretches the muscles in front of your thigh (quadriceps) without moving your knee joint (isometric). 1. Lie on your back with your left / right leg extended and your other knee bent. Put a rolled towel or small pillow under your knee if told by your health care provider. 2. Slowly tense the muscles in the front of your left /   right thigh. You should see your kneecap slide up toward your hip or see increased dimpling just above the knee. This motion will push the back of the knee toward the floor. 3. For __________ seconds, hold the muscle as tight as you can without increasing your pain. 4. Relax the muscles slowly and completely. Repeat __________ times. Complete this exercise __________ times a day. Straight leg raises This exercise stretches the muscles in front  of your thigh (quadriceps) and the muscles that move your hips (hip flexors). 1. Lie on your back with your left / right leg extended and your other knee bent. 2. Tense the muscles in the front of your left / right thigh. You should see your kneecap slide up or see increased dimpling just above the knee. Your thigh may even shake a bit. 3. Keep these muscles tight as you raise your leg 4-6 inches (10-15 cm) off the floor. Do not let your knee bend. 4. Hold this position for __________ seconds. 5. Keep these muscles tense as you lower your leg. 6. Relax your muscles slowly and completely after each repetition. Repeat __________ times. Complete this exercise __________ times a day. Hamstring, isometric 1. Lie on your back on a firm surface. 2. Bend your left / right knee about __________ degrees. 3. Dig your left / right heel into the surface as if you are trying to pull it toward your buttocks. Tighten the muscles in the back of your thighs (hamstring) to "dig" as hard as you can without increasing any pain. 4. Hold this position for __________ seconds. 5. Release the tension gradually and allow your muscles to relax completely for __________ seconds after each repetition. Repeat __________ times. Complete this exercise __________ times a day. Hamstring curls If told by your health care provider, do this exercise while wearing ankle weights. Begin with __________ lb weights. Then increase the weight by 1 lb (0.5 kg) increments. Do not wear ankle weights that are more than __________ lb. 1. Lie on your abdomen with your legs straight. 2. Bend your left / right knee as far as you can without feeling pain. Keep your hips flat against the floor. 3. Hold this position for __________ seconds. 4. Slowly lower your leg to the starting position. Repeat __________ times. Complete this exercise __________ times a day. Squats This exercise strengthens the muscles in front of your thigh and knee  (quadriceps). 1. Stand in front of a table, with your feet and knees pointing straight ahead. You may rest your hands on the table for balance but not for support. 2. Slowly bend your knees and lower your hips like you are going to sit in a chair. ? Keep your weight over your heels, not over your toes. ? Keep your lower legs upright so they are parallel with the table legs. ? Do not let your hips go lower than your knees. ? Do not bend lower than told by your health care provider. ? If your knee pain increases, do not bend as low. 3. Hold the squat position for __________ seconds. 4. Slowly push with your legs to return to standing. Do not use your hands to pull yourself to standing. Repeat __________ times. Complete this exercise __________ times a day. Wall slides This exercise strengthens the muscles in front of your thigh and knee (quadriceps). 1. Lean your back against a smooth wall or door, and walk your feet out 18-24 inches (46-61 cm) from it. 2. Place your feet hip-width apart. 3.   Slowly slide down the wall or door until your knees bend __________ degrees. Keep your knees over your heels, not over your toes. Keep your knees in line with your hips. 4. Hold this position for __________ seconds. Repeat __________ times. Complete this exercise __________ times a day. Straight leg raises This exercise strengthens the muscles that rotate the leg at the hip and move it away from your body (hip abductors). 1. Lie on your side with your left / right leg in the top position. Lie so your head, shoulder, knee, and hip line up. You may bend your bottom knee to help you keep your balance. 2. Roll your hips slightly forward so your hips are stacked directly over each other and your left / right knee is facing forward. 3. Leading with your heel, lift your top leg 4-6 inches (10-15 cm). You should feel the muscles in your outer hip lifting. ? Do not let your foot drift forward. ? Do not let your knee  roll toward the ceiling. 4. Hold this position for __________ seconds. 5. Slowly return your leg to the starting position. 6. Let your muscles relax completely after each repetition. Repeat __________ times. Complete this exercise __________ times a day. Straight leg raises This exercise stretches the muscles that move your hips away from the front of the pelvis (hip extensors). 1. Lie on your abdomen on a firm surface. You can put a pillow under your hips if that is more comfortable. 2. Tense the muscles in your buttocks and lift your left / right leg about 4-6 inches (10-15 cm). Keep your knee straight as you lift your leg. 3. Hold this position for __________ seconds. 4. Slowly lower your leg to the starting position. 5. Let your leg relax completely after each repetition. Repeat __________ times. Complete this exercise __________ times a day. This information is not intended to replace advice given to you by your health care provider. Make sure you discuss any questions you have with your health care provider. Document Released: 05/30/2005 Document Revised: 05/06/2018 Document Reviewed: 05/06/2018 Elsevier Patient Education  2020 [place] We placed an order today for your standing lab work.    Please come back and get your standing labs in December and every 3 months  We have open lab daily Monday through Thursday from 8:30-12:30 PM and 1:30-4:30 PM and Friday from 8:30-12:30 PM and 1:30-4:00 PM at the office of Dr. Bo Merino.   You may experience shorter wait times on Monday and Friday afternoons. The office is located at [address] [place], [place], [place] [address] No appointment is necessary.   Labs are drawn by [facility].  You may receive a bill from [facility] for your lab work.  If you wish to have your labs drawn at another location, please call the office 24 hours in advance to send orders.  If you have any questions regarding directions or  hours of operation,  please call [phone].   Just as a reminder please drink plenty of water prior to coming for your lab work. Thanks!  Back Exercises The following exercises strengthen the muscles that help to support the trunk and back. They also help to keep the lower back flexible. Doing these exercises can help to prevent back pain or lessen existing pain.  If you have back pain or discomfort, try doing these exercises 2-3 times each day or as told by your health care provider.  As your pain improves, do them once each  day, but increase the number of times that you repeat the steps for each exercise (do more repetitions).  To prevent the recurrence of back pain, continue to do these exercises once each day or as told by your health care provider. Do exercises exactly as told by your health care provider and adjust them as directed. It is normal to feel mild stretching, pulling, tightness, or discomfort as you do these exercises, but you should stop right away if you feel sudden pain or your pain gets worse. Exercises Single knee to chest Repeat these steps 3-5 times for each leg: 1. Lie on your back on a firm bed or the floor with your legs extended. 2. Bring one knee to your chest. Your other leg should stay extended and in contact with the floor. 3. Hold your knee in place by grabbing your knee or thigh with both hands and hold. 4. Pull on your knee until you feel a gentle stretch in your lower back or buttocks. 5. Hold the stretch for 10-30 seconds. 6. Slowly release and straighten your leg. Pelvic tilt Repeat these steps 5-10 times: 1. Lie on your back on a firm bed or the floor with your legs extended. 2. Bend your knees so they are pointing toward the ceiling and your feet are flat on the floor. 3. Tighten your lower abdominal muscles to press your lower back against the floor. This motion will tilt your pelvis so your tailbone points up toward the ceiling instead of  pointing to your feet or the floor. 4. With gentle tension and even breathing, hold this position for 5-10 seconds. Cat-cow Repeat these steps until your lower back becomes more flexible: 1. Get into a hands-and-knees position on a firm surface. Keep your hands under your shoulders, and keep your knees under your hips. You may place padding under your knees for comfort. 2. Let your head hang down toward your chest. Contract your abdominal muscles and point your tailbone toward the floor so your lower back becomes rounded like the back of a cat. 3. Hold this position for 5 seconds. 4. Slowly lift your head, let your abdominal muscles relax and point your tailbone up toward the ceiling so your back forms a sagging arch like the back of a cow. 5. Hold this position for 5 seconds.  Press-ups Repeat these steps 5-10 times: 1. Lie on your abdomen (face-down) on the floor. 2. Place your palms near your head, about shoulder-width apart. 3. Keeping your back as relaxed as possible and keeping your hips on the floor, slowly straighten your arms to raise the top half of your body and lift your shoulders. Do not use your back muscles to raise your upper torso. You may adjust the placement of your hands to make yourself more comfortable. 4. Hold this position for 5 seconds while you keep your back relaxed. 5. Slowly return to lying flat on the floor.  Bridges Repeat these steps 10 times: 1. Lie on your back on a firm surface. 2. Bend your knees so they are pointing toward the ceiling and your feet are flat on the floor. Your arms should be flat at your sides, next to your body. 3. Tighten your buttocks muscles and lift your buttocks off the floor until your waist is at almost the same height as your knees. You should feel the muscles working in your buttocks and the back of your thighs. If you do not feel these muscles, slide your feet 1-2 inches farther  away from your buttocks. 4. Hold this position for  3-5 seconds. 5. Slowly lower your hips to the starting position, and allow your buttocks muscles to relax completely. If this exercise is too easy, try doing it with your arms crossed over your chest. Abdominal crunches Repeat these steps 5-10 times: 1. Lie on your back on a firm bed or the floor with your legs extended. 2. Bend your knees so they are pointing toward the ceiling and your feet are flat on the floor. 3. Cross your arms over your chest. 4. Tip your chin slightly toward your chest without bending your neck. 5. Tighten your abdominal muscles and slowly raise your trunk (torso) high enough to lift your shoulder blades a tiny bit off the floor. Avoid raising your torso higher than that because it can put too much stress on your low back and does not help to strengthen your abdominal muscles. 6. Slowly return to your starting position. Back lifts Repeat these steps 5-10 times: 1. Lie on your abdomen (face-down) with your arms at your sides, and rest your forehead on the floor. 2. Tighten the muscles in your legs and your buttocks. 3. Slowly lift your chest off the floor while you keep your hips pressed to the floor. Keep the back of your head in line with the curve in your back. Your eyes should be looking at the floor. 4. Hold this position for 3-5 seconds. 5. Slowly return to your starting position. Contact a health care provider if:  Your back pain or discomfort gets much worse when you do an exercise.  Your worsening back pain or discomfort does not lessen within 2 hours after you exercise. If you have any of these problems, stop doing these exercises right away. Do not do them again unless your health care provider says that you can. Get help right away if:  You develop sudden, severe back pain. If this happens, stop doing the exercises right away. Do not do them again unless your health care provider says that you can. This information is not intended to replace advice given  to you by your health care provider. Make sure you discuss any questions you have with your health care provider. Document Released: 08/23/2004 Document Revised: 11/20/2018 Document Reviewed: 04/17/2018 Elsevier Patient Education  2020 [employer].

## 2019-06-24 ENCOUNTER — Other Ambulatory Visit: Payer: Self-pay | Admitting: Rheumatology

## 2019-06-24 NOTE — Telephone Encounter (Signed)
Last Visit: 05/19/2019 Next Visit: 10/23/2019  Okay to refill per Dr. Estanislado Pandy.

## 2019-07-09 ENCOUNTER — Other Ambulatory Visit: Payer: Self-pay | Admitting: Family Medicine

## 2019-07-13 ENCOUNTER — Other Ambulatory Visit: Payer: Self-pay

## 2019-07-14 NOTE — Telephone Encounter (Signed)
Accidentally sent to me, rx'ed by specialist

## 2019-07-14 NOTE — Telephone Encounter (Signed)
Patient requesting medication refill. Please address accordingly with patient.

## 2019-07-22 ENCOUNTER — Other Ambulatory Visit: Payer: Self-pay

## 2019-07-22 ENCOUNTER — Ambulatory Visit (INDEPENDENT_AMBULATORY_CARE_PROVIDER_SITE_OTHER): Payer: 59 | Admitting: Family Medicine

## 2019-07-22 DIAGNOSIS — B9689 Other specified bacterial agents as the cause of diseases classified elsewhere: Secondary | ICD-10-CM

## 2019-07-22 DIAGNOSIS — J019 Acute sinusitis, unspecified: Secondary | ICD-10-CM | POA: Diagnosis not present

## 2019-07-22 MED ORDER — AMOXICILLIN-POT CLAVULANATE 875-125 MG PO TABS: 1.0000 | ORAL_TABLET | Freq: Two times a day (BID) | ORAL | 0 refills | Status: AC

## 2019-07-22 MED ORDER — FLUCONAZOLE 150 MG PO TABS: ORAL_TABLET | ORAL | 0 refills | Status: DC

## 2019-07-22 NOTE — Progress Notes (Signed)
   Subjective:    Patient ID: Karen Burton, female    DOB: June 10, 1976, 43 y.o.   MRN: [id number]  Sinusitis This is a new problem. The current episode started in the past 7 days. Associated symptoms include congestion and coughing. Pertinent negatives include no ear pain or shortness of breath. (Headache, throat feeling weird (feels like there is a lump in throat when she swallows), more pt talks the more raspy her voice gets, COVID test negative, drainage) Treatments tried: Allegra, benadryl, Claritin, Zyrtec, tylenol, ibuprofen   Patient with significant head congestion drainage coughing she did go to the health clinic at work and was tested and was negative for Covid and was allowed to return to work [place] benign denies wheezing difficulty breathing Virtual Visit via Telephone Note  I connected with Karen Burton on 07/22/19 at  4:10 PM EST by telephone and verified that I am speaking with the correct person using two identifiers.  Location: Patient: home Provider: office   I discussed the limitations, risks, security and privacy concerns of performing an evaluation and management service by telephone and the availability of in person appointments. I also discussed with the patient that there may be a patient responsible charge related to this service. The patient expressed understanding and agreed to proceed.   History of Present Illness:    Observations/Objective:   Assessment and Plan:   Follow Up Instructions:    I discussed the assessment and treatment plan with the patient. The patient was provided an opportunity to ask questions and all were answered. The patient agreed with the plan and demonstrated an understanding of the instructions.   The patient was advised to call back or seek an in-person evaluation if the symptoms worsen or if the condition fails to improve as anticipated.  I provided 15 minutes of non-face-to-face time during this encounter.   Vicente Males, LPN    Review of Systems  Constitutional: Negative for activity change and fever.  HENT: Positive for congestion and rhinorrhea. Negative for ear pain.   Eyes: Negative for discharge.  Respiratory: Positive for cough. Negative for shortness of breath and wheezing.   Cardiovascular: Negative for chest pain.       Objective:   Physical Exam  Today's visit was via telephone Physical exam was not possible for this visit       Assessment & Plan:  Viral syndrome Secondary rhinosinusitis Antibiotic prescribed warning signs discussed Patient did request Diflucan Cannot rule out Covid 100% but unlikely since her test was negative but certainly if her symptoms persist or worsen she should consider retesting and calling us

## 2019-08-02 ENCOUNTER — Encounter: Payer: Self-pay | Admitting: Family Medicine

## 2019-08-05 MED FILL — SIMPONI 50 MG/0.5ML SOAJ: 50 | 28 days supply | Qty: 1 | Fill #1

## 2019-08-24 ENCOUNTER — Other Ambulatory Visit: Payer: Self-pay | Admitting: Rheumatology

## 2019-08-24 NOTE — Telephone Encounter (Signed)
Last Visit: 05/19/2019 Next Visit: 10/23/2019  Okay to refill per Dr. Estanislado Pandy

## 2019-08-31 ENCOUNTER — Encounter: Payer: Self-pay | Admitting: Family Medicine

## 2019-09-14 MED FILL — SIMPONI 50 MG/0.5ML SOAJ: 50 | 28 days supply | Qty: 1 | Fill #2

## 2019-10-09 ENCOUNTER — Other Ambulatory Visit: Payer: Self-pay | Admitting: Family Medicine

## 2019-10-09 ENCOUNTER — Encounter: Payer: Self-pay | Admitting: Rheumatology

## 2019-10-09 NOTE — Telephone Encounter (Signed)
Last med check up 02/06/19

## 2019-10-09 NOTE — Telephone Encounter (Signed)
90-day with follow-up within 3 months

## 2019-10-15 NOTE — Progress Notes (Signed)
Office Visit Note  Patient: Karen Burton             Date of Birth: [birth year]           MRN: [id number]             PCP: Mikey Kirschner, MD Referring: Mikey Kirschner, MD Visit Date: 10/23/2019 Occupation: @GUAROCC @  Subjective:  Pain in left shoulder.   History of Present Illness: NEISHA REAMES is a 44 y.o. female with history of rheumatoid arthritis and fibromyalgia syndrome.  She states about 2 weeks ago she started having some pain in her left shoulder.  Which has been gradually getting worse.  Now she is having difficulty raising her arm.  He has some discomfort in her right knee joint.  She has noticed some swelling in her hands.  Activities of Daily Living:  Patient reports morning stiffness for 1 hour.   Patient Reports nocturnal pain.  Difficulty dressing/grooming: Reports Difficulty climbing stairs: Denies Difficulty getting out of chair: Denies Difficulty using hands for taps, buttons, cutlery, and/or writing: Denies  Review of Systems  Constitutional: Positive for fatigue. Negative for night sweats, weight gain and weight loss.  HENT: Positive for nose dryness. Negative for mouth sores, trouble swallowing, trouble swallowing and mouth dryness.   Eyes: Positive for dryness. Negative for pain, redness and visual disturbance.  Respiratory: Negative for cough, shortness of breath, wheezing and difficulty breathing.   Cardiovascular: Negative for chest pain, palpitations, hypertension, irregular heartbeat and swelling in legs/feet.  Gastrointestinal: Positive for constipation. Negative for blood in stool and diarrhea.  Endocrine: Negative for increased urination.  Genitourinary: Negative for difficulty urinating, painful urination and vaginal dryness.  Musculoskeletal: Positive for arthralgias, joint pain, joint swelling, myalgias, morning stiffness, muscle tenderness and myalgias. Negative for muscle weakness.  Skin: Negative for color change, rash, hair  loss, redness, skin tightness, ulcers and sensitivity to sunlight.  Allergic/Immunologic: Negative for susceptible to infections.  Neurological: Positive for numbness, headaches and weakness. Negative for dizziness, memory loss and night sweats.  Hematological: Positive for bruising/bleeding tendency. Negative for swollen glands.  Psychiatric/Behavioral: Negative for depressed mood, confusion and sleep disturbance. The patient is not nervous/anxious.     PMFS History:  Patient Active Problem List   Diagnosis Date Noted  . Screening for colorectal cancer 02/13/2019  . Encounter for gynecological examination with Papanicolaou smear of cervix 02/13/2019  . Fibroids 02/13/2019  . Encounter for surveillance of contraceptive pills 08/12/2017  . Encounter for well woman exam with routine gynecological exam 08/12/2017  . ANA positive 11/06/2016  . Fibromyalgia 11/06/2016  . Menorrhagia with regular cycle 12/20/2015  . Dysmenorrhea 12/20/2015  . Uterine leiomyoma 12/20/2015  . Vulvar itching 12/20/2015  . Anemia 12/20/2015  . Essential hypertension 01/12/2013  . Esophageal reflux 01/12/2013  . Rheumatoid arthritis ([place]) 01/12/2013  . Rash and nonspecific skin eruption 01/12/2013    Past Medical History:  Diagnosis Date  . Anemia   . Arthritis   . Dysmenorrhea 12/20/2015  . Eczema    mild  . Fibroids 12/20/2015  . Fibromyalgia   . Menorrhagia with regular cycle 12/20/2015  . Rash   . Reflux   . Rhinitis    chonic  . Vitamin D deficiency   . Vulvar itching 12/20/2015    Family History  Problem Relation Age of Onset  . Diabetes Mother   . Hypertension Mother   . Asthma Mother   . Cancer Mother  breast  . Thyroid disease Mother   . Arthritis Mother   . Graves' disease Mother   . Cancer Maternal Grandmother        pancreatic  . COPD Maternal Aunt   . Asthma Maternal Aunt   . Gout Maternal Aunt    Past Surgical History:  Procedure Laterality Date  .  ESOPHAGOGASTRODUODENOSCOPY ENDOSCOPY    . IR GENERIC HISTORICAL  02/07/2016   IR RADIOLOGIST EVAL & MGMT 02/07/2016 [name], MD [facility] INTERV RAD  . WISDOM TOOTH EXTRACTION     Social History   Social History Narrative  . Not on file   Immunization History  Administered Date(s) Administered  . DT (Pediatric) 02/20/2013  . Influenza-Unspecified 07/18/2009, 05/16/2016, 06/03/2017, 04/07/2018  . Pneumococcal Polysaccharide-23 05/08/2007     Objective: Vital Signs: BP 127/90 (BP Location: Right Arm, Patient Position: Sitting, Cuff Size: Normal)   Pulse 88   Resp 17   Ht 5\' 5"  ([id number] m)   Wt 205 lb 6.4 oz (93.2 kg)   BMI 34.18 kg/m    Physical Exam Vitals and nursing note reviewed.  Constitutional:      Appearance: She is well-developed.  HENT:     Head: Normocephalic and atraumatic.  Eyes:     Conjunctiva/sclera: Conjunctivae normal.  Cardiovascular:     Rate and Rhythm: Normal rate and regular rhythm.     Heart sounds: Normal heart sounds.  Pulmonary:     Effort: Pulmonary effort is normal.     Breath sounds: Normal breath sounds.  Abdominal:     General: Bowel sounds are normal.     Palpations: Abdomen is soft.  Musculoskeletal:     Cervical back: Normal range of motion.  Lymphadenopathy:     Cervical: No cervical adenopathy.  Skin:    General: Skin is warm and dry.     Capillary Refill: Capillary refill takes less than 2 seconds.  Neurological:     Mental Status: She is alert and oriented to person, place, and time.  Psychiatric:        Behavior: Behavior normal.      Musculoskeletal Exam: C-spine was in good range of motion. Right shoulder joint was in good range of motion. She has painful full range of motion of her left shoulder joint. Elbow joints wrist joint MCPs PIPs DIPs with good range of motion with no synovitis. Hip joints, knee joints, ankles, MTPs and PIPs with good range of motion with no synovitis.  CDAI Exam: CDAI Score: 2  Patient Global: 6  mm; Provider Global: 4 mm Swollen: 0 ; Tender: 1  Joint Exam 10/23/2019      Right  Left  Glenohumeral      Tender     Investigation: No additional findings.  Imaging: XR Shoulder Left  Result Date: 10/23/2019 No glenohumeral joint space narrowing was noted.  No acromioclavicular joint space narrowing was noted.  No chondrocalcinosis was noted. Impression: Unremarkable x-ray of the shoulder joint.   Recent Labs: Lab Results  Component Value Date   WBC 5.4 04/10/2019   HGB 12.4 04/10/2019   PLT 254 04/10/2019   NA 139 04/10/2019   K 3.9 04/10/2019   CL 106 04/10/2019   CO2 26 04/10/2019   GLUCOSE 92 04/10/2019   BUN 10 04/10/2019   CREATININE 0.79 04/10/2019   BILITOT 0.3 04/10/2019   ALKPHOS 65 08/04/2018   AST 25 04/10/2019   ALT 23 04/10/2019   PROT 6.4 04/10/2019   ALBUMIN 4.1 08/04/2018  CALCIUM 8.8 04/10/2019   GFRAA 106 04/10/2019   QFTBGOLDPLUS Negative 08/04/2018    Speciality Comments: Prior therapy includes: methotrexate and Plaquenil (neutropenia) and Humira (painful injections)  Procedures:  Large Joint Inj: L glenohumeral on 10/23/2019 12:15 PM Indications: pain Details: 27 G 1.5 in needle, posterior approach  Arthrogram: No  Medications: 40 mg triamcinolone acetonide 40 MG/ML; 1.5 mL lidocaine 1 % Aspirate: 0 mL Outcome: tolerated well, no immediate complications Procedure, treatment alternatives, risks and benefits explained, specific risks discussed. Consent was given by the patient. Immediately prior to procedure a time out was called to verify the correct patient, procedure, equipment, support staff and site/side marked as required. Patient was prepped and draped in the usual sterile fashion.     Allergies: Ceftin [cefuroxime axetil], Humira [adalimumab], and Methotrexate derivatives   Assessment / Plan:     Visit Diagnoses: Rheumatoid arthritis involving multiple sites with positive rheumatoid factor (HCC)-she has no synovitis on  examination. She has progressively increasing pain in her left shoulder joint.  High risk medication use - Simponi 50 mg every 28 days. Last TB gold negative on 08/04/2018 and will monitor yearly. - Plan: CBC with Differential/Platelet, COMPLETE METABOLIC PANEL WITH GFR, QuantiFERON-TB Gold Plus today and then every 3 months.  Acute pain of left shoulder -she has been having severe pain and discomfort in her left shoulder joint and difficulty moving. The x-ray of the shoulder joint was unremarkable. Different treatment options were discussed. I have given her a handout on shoulder joint exercises which she will start after 3 days. After discussing indications side effects contraindications left shoulder joint was injected with cortisone as described above. Plan: XR Shoulder Left  Chronic pain of both knees - x-rays of her knee joints were unremarkable.  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  Fibromyalgia-she continues to have some generalized pain and discomfort.  Other fatigue-secondary to insomnia and fatigue.  History of insomnia-good sleep hygiene was discussed.  Elevated CK  History of gastroesophageal reflux (GERD)  History of hypertension-her diastolic was mildly elevated. Have advised her to monitor blood pressure closely.  Menorrhagia with regular cycle  History of immunosuppression therapy - Plan: QuantiFERON-TB Gold Plus  Orders: Orders Placed This Encounter  Procedures  . Large Joint Inj  . XR Shoulder Left  . CBC with Differential/Platelet  . COMPLETE METABOLIC PANEL WITH GFR  . QuantiFERON-TB Gold Plus   Meds ordered this encounter  Medications  . Golimumab (SIMPONI) 50 MG/0.5ML SOAJ    Sig: INJECT 50MG  UNDER THE SKIN ONCE EVERY 30 DAYS    Dispense:  1.5 mL    Refill:  0      Follow-Up Instructions: Return in about 3 months (around 01/23/2020) for Rheumatoid arthritis.   Bo Merino, MD  Note - This record  has been created using Editor, commissioning.  Chart creation errors have been sought, but may not always  have been located. Such creation errors do not reflect on  the standard of medical care.

## 2019-10-22 ENCOUNTER — Other Ambulatory Visit: Payer: Self-pay

## 2019-10-22 ENCOUNTER — Other Ambulatory Visit: Payer: Self-pay | Admitting: Family Medicine

## 2019-10-23 ENCOUNTER — Encounter: Payer: Self-pay | Admitting: Rheumatology

## 2019-10-23 ENCOUNTER — Ambulatory Visit: Payer: 59 | Admitting: Rheumatology

## 2019-10-23 ENCOUNTER — Other Ambulatory Visit: Payer: Self-pay

## 2019-10-23 ENCOUNTER — Ambulatory Visit: Payer: Self-pay

## 2019-10-23 VITALS — BP 127/90 | HR 88 | Resp 17 | Ht 65.0 in | Wt 205.4 lb

## 2019-10-23 DIAGNOSIS — M25512 Pain in left shoulder: Secondary | ICD-10-CM | POA: Diagnosis not present

## 2019-10-23 DIAGNOSIS — M797 Fibromyalgia: Secondary | ICD-10-CM

## 2019-10-23 DIAGNOSIS — M25562 Pain in left knee: Secondary | ICD-10-CM

## 2019-10-23 DIAGNOSIS — R5383 Other fatigue: Secondary | ICD-10-CM

## 2019-10-23 DIAGNOSIS — Z79899 Other long term (current) drug therapy: Secondary | ICD-10-CM

## 2019-10-23 DIAGNOSIS — Z8719 Personal history of other diseases of the digestive system: Secondary | ICD-10-CM

## 2019-10-23 DIAGNOSIS — N920 Excessive and frequent menstruation with regular cycle: Secondary | ICD-10-CM

## 2019-10-23 DIAGNOSIS — Z9225 Personal history of immunosupression therapy: Secondary | ICD-10-CM | POA: Diagnosis not present

## 2019-10-23 DIAGNOSIS — Z87898 Personal history of other specified conditions: Secondary | ICD-10-CM

## 2019-10-23 DIAGNOSIS — M25561 Pain in right knee: Secondary | ICD-10-CM | POA: Diagnosis not present

## 2019-10-23 DIAGNOSIS — Z8679 Personal history of other diseases of the circulatory system: Secondary | ICD-10-CM

## 2019-10-23 DIAGNOSIS — R748 Abnormal levels of other serum enzymes: Secondary | ICD-10-CM | POA: Diagnosis not present

## 2019-10-23 DIAGNOSIS — G8929 Other chronic pain: Secondary | ICD-10-CM

## 2019-10-23 DIAGNOSIS — M0579 Rheumatoid arthritis with rheumatoid factor of multiple sites without organ or systems involvement: Secondary | ICD-10-CM

## 2019-10-23 MED ORDER — FLUCONAZOLE 150 MG PO TABS: ORAL_TABLET | ORAL | 0 refills | Status: DC

## 2019-10-23 MED ORDER — SIMPONI 50 MG/0.5ML ~~LOC~~ SOAJ: SUBCUTANEOUS | 0 refills | Status: DC

## 2019-10-23 MED ORDER — TRIAMCINOLONE ACETONIDE 40 MG/ML IJ SUSP
40.0000 mg | INTRAMUSCULAR | Status: AC | PRN
  Administered 2019-10-23: 40 mg via INTRA_ARTICULAR

## 2019-10-23 MED ORDER — LIDOCAINE HCL 1 % IJ SOLN
1.5000 mL | INTRAMUSCULAR | Status: AC | PRN
  Administered 2019-10-23: 1.5 mL

## 2019-10-23 NOTE — Patient Instructions (Addendum)
Shoulder Exercises Ask your health care provider which exercises are safe for you. Do exercises exactly as told by your health care provider and adjust them as directed. It is normal to feel mild stretching, pulling, tightness, or discomfort as you do these exercises. Stop right away if you feel sudden pain or your pain gets worse. Do not begin these exercises until told by your health care provider. Stretching exercises External rotation and abduction This exercise is sometimes called corner stretch. This exercise rotates your arm outward (external rotation) and moves your arm out from your body (abduction). 1. Stand in a doorway with one of your feet slightly in front of the other. This is called a staggered stance. If you cannot reach your forearms to the door frame, stand facing a corner of a room. 2. Choose one of the following positions as told by your health care provider: ? Place your hands and forearms on the door frame above your head. ? Place your hands and forearms on the door frame at the height of your head. ? Place your hands on the door frame at the height of your elbows. 3. Slowly move your weight onto your front foot until you feel a stretch across your chest and in the front of your shoulders. Keep your head and chest upright and keep your abdominal muscles tight. 4. Hold for __________ seconds. 5. To release the stretch, shift your weight to your back foot. Repeat __________ times. Complete this exercise __________ times a day. Extension, standing 1. Stand and hold a broomstick, a cane, or a similar object behind your back. ? Your hands should be a little wider than shoulder width apart. ? Your palms should face away from your back. 2. Keeping your elbows straight and your shoulder muscles relaxed, move the stick away from your body until you feel a stretch in your shoulders (extension). ? Avoid shrugging your shoulders while you move the stick. Keep your shoulder blades  tucked down toward the middle of your back. 3. Hold for __________ seconds. 4. Slowly return to the starting position. Repeat __________ times. Complete this exercise __________ times a day. Range-of-motion exercises Pendulum  1. Stand near a wall or a surface that you can hold onto for balance. 2. Bend at the waist and let your left / right arm hang straight down. Use your other arm to support you. Keep your back straight and do not lock your knees. 3. Relax your left / right arm and shoulder muscles, and move your hips and your trunk so your left / right arm swings freely. Your arm should swing because of the motion of your body, not because you are using your arm or shoulder muscles. 4. Keep moving your hips and trunk so your arm swings in the following directions, as told by your health care provider: ? Side to side. ? Forward and backward. ? In clockwise and counterclockwise circles. 5. Continue each motion for __________ seconds, or for as long as told by your health care provider. 6. Slowly return to the starting position. Repeat __________ times. Complete this exercise __________ times a day. Shoulder flexion, standing  1. Stand and hold a broomstick, a cane, or a similar object. Place your hands a little more than shoulder width apart on the object. Your left / right hand should be palm up, and your other hand should be palm down. 2. Keep your elbow straight and your shoulder muscles relaxed. Push the stick up with your healthy arm  to raise your left / right arm in front of your body, and then over your head until you feel a stretch in your shoulder (flexion). ? Avoid shrugging your shoulder while you raise your arm. Keep your shoulder blade tucked down toward the middle of your back. 3. Hold for __________ seconds. 4. Slowly return to the starting position. Repeat __________ times. Complete this exercise __________ times a day. Shoulder abduction, standing 1. Stand and hold a  broomstick, a cane, or a similar object. Place your hands a little more than shoulder width apart on the object. Your left / right hand should be palm up, and your other hand should be palm down. 2. Keep your elbow straight and your shoulder muscles relaxed. Push the object across your body toward your left / right side. Raise your left / right arm to the side of your body (abduction) until you feel a stretch in your shoulder. ? Do not raise your arm above shoulder height unless your health care provider tells you to do that. ? If directed, raise your arm over your head. ? Avoid shrugging your shoulder while you raise your arm. Keep your shoulder blade tucked down toward the middle of your back. 3. Hold for __________ seconds. 4. Slowly return to the starting position. Repeat __________ times. Complete this exercise __________ times a day. Internal rotation  1. Place your left / right hand behind your back, palm up. 2. Use your other hand to dangle an exercise band, a towel, or a similar object over your shoulder. Grasp the band with your left / right hand so you are holding on to both ends. 3. Gently pull up on the band until you feel a stretch in the front of your left / right shoulder. The movement of your arm toward the center of your body is called internal rotation. ? Avoid shrugging your shoulder while you raise your arm. Keep your shoulder blade tucked down toward the middle of your back. 4. Hold for __________ seconds. 5. Release the stretch by letting go of the band and lowering your hands. Repeat __________ times. Complete this exercise __________ times a day. Strengthening exercises External rotation  1. Sit in a stable chair without armrests. 2. Secure an exercise band to a stable object at elbow height on your left / right side. 3. Place a soft object, such as a folded towel or a small pillow, between your left / right upper arm and your body to move your elbow about 4 inches (10  cm) away from your side. 4. Hold the end of the exercise band so it is tight and there is no slack. 5. Keeping your elbow pressed against the soft object, slowly move your forearm out, away from your abdomen (external rotation). Keep your body steady so only your forearm moves. 6. Hold for __________ seconds. 7. Slowly return to the starting position. Repeat __________ times. Complete this exercise __________ times a day. Shoulder abduction  1. Sit in a stable chair without armrests, or stand up. 2. Hold a __________ weight in your left / right hand, or hold an exercise band with both hands. 3. Start with your arms straight down and your left / right palm facing in, toward your body. 4. Slowly lift your left / right hand out to your side (abduction). Do not lift your hand above shoulder height unless your health care provider tells you that this is safe. ? Keep your arms straight. ? Avoid shrugging your shoulder while  you do this movement. Keep your shoulder blade tucked down toward the middle of your back. 5. Hold for __________ seconds. 6. Slowly lower your arm, and return to the starting position. Repeat __________ times. Complete this exercise __________ times a day. Shoulder extension 1. Sit in a stable chair without armrests, or stand up. 2. Secure an exercise band to a stable object in front of you so it is at shoulder height. 3. Hold one end of the exercise band in each hand. Your palms should face each other. 4. Straighten your elbows and lift your hands up to shoulder height. 5. Step back, away from the secured end of the exercise band, until the band is tight and there is no slack. 6. Squeeze your shoulder blades together as you pull your hands down to the sides of your thighs (extension). Stop when your hands are straight down by your sides. Do not let your hands go behind your body. 7. Hold for __________ seconds. 8. Slowly return to the starting position. Repeat __________  times. Complete this exercise __________ times a day. Shoulder row 1. Sit in a stable chair without armrests, or stand up. 2. Secure an exercise band to a stable object in front of you so it is at waist height. 3. Hold one end of the exercise band in each hand. Position your palms so that your thumbs are facing the ceiling (neutral position). 4. Bend each of your elbows to a 90-degree angle (right angle) and keep your upper arms at your sides. 5. Step back until the band is tight and there is no slack. 6. Slowly pull your elbows back behind you. 7. Hold for __________ seconds. 8. Slowly return to the starting position. Repeat __________ times. Complete this exercise __________ times a day. Shoulder press-ups  1. Sit in a stable chair that has armrests. Sit upright, with your feet flat on the floor. 2. Put your hands on the armrests so your elbows are bent and your fingers are pointing forward. Your hands should be about even with the sides of your body. 3. Push down on the armrests and use your arms to lift yourself off the chair. Straighten your elbows and lift yourself up as much as you comfortably can. ? Move your shoulder blades down, and avoid letting your shoulders move up toward your ears. ? Keep your feet on the ground. As you get stronger, your feet should support less of your body weight as you lift yourself up. 4. Hold for __________ seconds. 5. Slowly lower yourself back into the chair. Repeat __________ times. Complete this exercise __________ times a day. Wall push-ups  1. Stand so you are facing a stable wall. Your feet should be about one arm-length away from the wall. 2. Lean forward and place your palms on the wall at shoulder height. 3. Keep your feet flat on the floor as you bend your elbows and lean forward toward the wall. 4. Hold for __________ seconds. 5. Straighten your elbows to push yourself back to the starting position. Repeat __________ times. Complete this  exercise __________ times a day. This information is not intended to replace advice given to you by your health care provider. Make sure you discuss any questions you have with your health care provider. Document Revised: 11/07/2018 Document Reviewed: 08/15/2018 Elsevier Patient Education  [place] We placed an order today for your standing lab work.    Please come back and get your standing labs  in June and every 3 months We have open lab daily Monday through Thursday from 8:30-12:30 PM and 1:30-4:30 PM and Friday from 8:30-12:30 PM and 1:30-4:00 PM at the office of Dr. Bo Merino.   You may experience shorter wait times on Monday and Friday afternoons. The office is located at [address], [place], [place], [place] [address] No appointment is necessary.   Labs are drawn by [facility].  You may receive a bill from [facility] for your lab work.  If you wish to have your labs drawn at another location, please call the office 24 hours in advance to send orders.  If you have any questions regarding directions or hours of operation,  please call [phone].   Just as a reminder please drink plenty of water prior to coming for your lab work. Thanks!

## 2019-10-25 LAB — CBC WITH DIFFERENTIAL/PLATELET
Absolute Monocytes: 572 cells/uL (ref 200–950)
Basophils Absolute: 28 cells/uL (ref 0–200)
Basophils Relative: 0.5 %
Eosinophils Absolute: 171 cells/uL (ref 15–500)
Eosinophils Relative: 3.1 %
HCT: 38.7 % (ref 35.0–45.0)
Hemoglobin: 12.9 g/dL (ref 11.7–15.5)
Lymphs Abs: 1810 cells/uL (ref 850–3900)
MCH: 29.6 pg (ref 27.0–33.0)
MCHC: 33.3 g/dL (ref 32.0–36.0)
MCV: 88.8 fL (ref 80.0–100.0)
MPV: 10.5 fL (ref 7.5–12.5)
Monocytes Relative: 10.4 %
Neutro Abs: 2921 cells/uL (ref 1500–7800)
Neutrophils Relative %: 53.1 %
Platelets: 255 10*3/uL (ref 140–400)
RBC: 4.36 10*6/uL (ref 3.80–5.10)
RDW: 13.2 % (ref 11.0–15.0)
Total Lymphocyte: 32.9 %
WBC: 5.5 10*3/uL (ref 3.8–10.8)

## 2019-10-25 LAB — COMPLETE METABOLIC PANEL WITH GFR
AG Ratio: 1.4 (calc) (ref 1.0–2.5)
ALT: 26 U/L (ref 6–29)
AST: 26 U/L (ref 10–30)
Albumin: 3.7 g/dL (ref 3.6–5.1)
Alkaline phosphatase (APISO): 62 U/L (ref 31–125)
BUN: 9 mg/dL (ref 7–25)
CO2: 28 mmol/L (ref 20–32)
Calcium: 9 mg/dL (ref 8.6–10.2)
Chloride: 103 mmol/L (ref 98–110)
Creat: 0.82 mg/dL (ref 0.50–1.10)
GFR, Est African American: 101 mL/min/{1.73_m2} (ref >=60)
GFR, Est Non African American: 87 mL/min/{1.73_m2} (ref >=60)
Globulin: 2.7 g/dL (calc) (ref 1.9–3.7)
Glucose, Bld: 87 mg/dL (ref 65–99)
Potassium: 3.7 mmol/L (ref 3.5–5.3)
Sodium: 138 mmol/L (ref 135–146)
Total Bilirubin: 0.4 mg/dL (ref 0.2–1.2)
Total Protein: 6.4 g/dL (ref 6.1–8.1)

## 2019-10-25 LAB — QUANTIFERON-TB GOLD PLUS
Mitogen-NIL: >10 IU/mL
NIL: 0.02 IU/mL
QuantiFERON-TB Gold Plus: NEGATIVE
TB1-NIL: 0 IU/mL
TB2-NIL: 0 IU/mL

## 2019-10-25 NOTE — Progress Notes (Signed)
WNLs

## 2019-10-26 ENCOUNTER — Other Ambulatory Visit: Payer: Self-pay | Admitting: Rheumatology

## 2019-10-26 NOTE — Progress Notes (Signed)
CBC, CMP and TB gold were all within normal limits.

## 2019-10-27 ENCOUNTER — Other Ambulatory Visit: Payer: Self-pay | Admitting: Pharmacist

## 2019-10-27 ENCOUNTER — Ambulatory Visit (HOSPITAL_BASED_OUTPATIENT_CLINIC_OR_DEPARTMENT_OTHER): Payer: 59 | Admitting: Pharmacist

## 2019-10-27 ENCOUNTER — Encounter: Payer: Self-pay | Admitting: Family Medicine

## 2019-10-27 DIAGNOSIS — Z79899 Other long term (current) drug therapy: Secondary | ICD-10-CM

## 2019-10-27 MED ORDER — SIMPONI 50 MG/0.5ML ~~LOC~~ SOAJ: SUBCUTANEOUS | 0 refills | Status: DC

## 2019-10-27 MED FILL — SIMPONI 50 MG/0.5ML SOAJ: 50 | 27 days supply | Qty: 1 | Fill #0

## 2019-10-27 NOTE — Progress Notes (Signed)
   S: Patient presents to [facility] [place] for review of their specialty medication therapy.  Patient is currently taking Simponi for rheumatoid arthritis. Patient is managed by Dr. Estanislado Pandy for this.   She was on Humira in the past but the injections were very painful so she was switched to Simponi. She has had no issues taking the Simponi.  Adherence: denies any missed doses  Efficacy: reports that overall it works well for her   Dosing:  Rheumatoid arthritis: SubQ: 50 mg once a month (in combination with methotrexate)  Dose adjustments: Renal: no dose adjustments (has not been studied) Hepatic: no dose adjustments (has not been studied)  Screening: TB test: completed; negative 10/23/19 Hepatitis: completed per patient  Monitoring: S/sx of infection: denies CBC: see below, regularly monitored S/sx of hypersensitivity: denies S/sx of malignancy: denies S/sx of heart failure: denies S/sx of autoimmune disorder: denies   O:   Lab Results  Component Value Date   WBC 5.5 10/23/2019   HGB 12.9 10/23/2019   HCT 38.7 10/23/2019   MCV 88.8 10/23/2019   PLT 255 10/23/2019      Chemistry      Component Value Date/Time   NA 138 10/23/2019 1210   NA 139 08/04/2018 1138   K 3.7 10/23/2019 1210   CL 103 10/23/2019 1210   CO2 28 10/23/2019 1210   BUN 9 10/23/2019 1210   BUN 10 08/04/2018 1138   CREATININE 0.82 10/23/2019 1210      Component Value Date/Time   CALCIUM 9.0 10/23/2019 1210   ALKPHOS 65 08/04/2018 1138   AST 26 10/23/2019 1210   ALT 26 10/23/2019 1210   BILITOT 0.4 10/23/2019 1210   BILITOT <0.2 08/04/2018 1138       A/P: 1. Medication review: Patient currently on Simponi for rheumatoid arthritis. Reviewed the medication with the patient, including the following: Simponi, golimumab, is a TNF? blocker.  Patient educated on purpose, proper use and potential adverse effects of Simponi. There is an increased risk of infection and malignancy with this  medication. Do not give patients live vaccinations while they are on this medication. No recommendations for changes.   Benard Halsted, PharmD, [place] [phone]

## 2019-10-27 NOTE — Telephone Encounter (Signed)
Last Visit: 10/23/19 Next Visit: 01/25/20  Okay to refill per Dr. Estanislado Pandy

## 2019-10-28 ENCOUNTER — Encounter: Payer: Self-pay | Admitting: Family Medicine

## 2019-10-28 ENCOUNTER — Encounter: Payer: Self-pay | Admitting: Rheumatology

## 2019-10-28 DIAGNOSIS — M25512 Pain in left shoulder: Secondary | ICD-10-CM

## 2019-10-28 NOTE — Telephone Encounter (Signed)
As patient had no improvement with steroid injection of the exercises please schedule MRI of the shoulder joint.  Please notify patient.

## 2019-11-16 ENCOUNTER — Telehealth: Payer: Self-pay | Admitting: Rheumatology

## 2019-11-16 NOTE — Telephone Encounter (Signed)
Patient called stating she noticed that her MRI is without contrast.  Patient is requesting a return call to let her know if the doctor would prefer her to have it with contrast.  Patient states she is worried the only reason it is not being performed with contrast is because there is a record of her being allergic which is incorrect.  Patient is requesting a return call.

## 2019-11-17 NOTE — Telephone Encounter (Signed)
LMOM for patient to return call to discuss MRI order

## 2019-11-17 NOTE — Telephone Encounter (Signed)
The contrast is not required for MRI of the shoulder to evaluate for rotator cuff tendinopathy.  Please notify patient.

## 2019-11-19 ENCOUNTER — Ambulatory Visit (INDEPENDENT_AMBULATORY_CARE_PROVIDER_SITE_OTHER): Payer: 59 | Admitting: Adult Health

## 2019-11-19 ENCOUNTER — Other Ambulatory Visit: Payer: Self-pay

## 2019-11-19 ENCOUNTER — Encounter: Payer: Self-pay | Admitting: Adult Health

## 2019-11-19 ENCOUNTER — Telehealth: Payer: Self-pay | Admitting: Rheumatology

## 2019-11-19 ENCOUNTER — Ambulatory Visit (HOSPITAL_COMMUNITY)
Admission: RE | Admit: 2019-11-19 | Discharge: 2019-11-19 | Disposition: A | Discharge status: Home / Self Care | Payer: 59 | Source: Ambulatory Visit | Attending: Rheumatology | Admitting: Rheumatology

## 2019-11-19 ENCOUNTER — Other Ambulatory Visit: Payer: Self-pay | Admitting: Adult Health

## 2019-11-19 VITALS — BP 127/88 | HR 94 | Ht 65.5 in | Wt 203.0 lb

## 2019-11-19 DIAGNOSIS — G479 Sleep disorder, unspecified: Secondary | ICD-10-CM | POA: Insufficient documentation

## 2019-11-19 DIAGNOSIS — R600 Localized edema: Secondary | ICD-10-CM | POA: Diagnosis not present

## 2019-11-19 DIAGNOSIS — R635 Abnormal weight gain: Secondary | ICD-10-CM | POA: Insufficient documentation

## 2019-11-19 DIAGNOSIS — D259 Leiomyoma of uterus, unspecified: Secondary | ICD-10-CM

## 2019-11-19 DIAGNOSIS — M25512 Pain in left shoulder: Secondary | ICD-10-CM | POA: Diagnosis not present

## 2019-11-19 DIAGNOSIS — D219 Benign neoplasm of connective and other soft tissue, unspecified: Secondary | ICD-10-CM

## 2019-11-19 DIAGNOSIS — Z01419 Encounter for gynecological examination (general) (routine) without abnormal findings: Secondary | ICD-10-CM | POA: Diagnosis not present

## 2019-11-19 DIAGNOSIS — Z1211 Encounter for screening for malignant neoplasm of colon: Secondary | ICD-10-CM | POA: Diagnosis not present

## 2019-11-19 DIAGNOSIS — Z1212 Encounter for screening for malignant neoplasm of rectum: Secondary | ICD-10-CM | POA: Diagnosis not present

## 2019-11-19 DIAGNOSIS — Z3041 Encounter for surveillance of contraceptive pills: Secondary | ICD-10-CM

## 2019-11-19 LAB — HEMOCCULT GUIAC POC 1CARD (OFFICE): Fecal Occult Blood, POC: NEGATIVE

## 2019-11-19 MED ORDER — ZOLPIDEM TARTRATE 5 MG PO TABS: 5.0000 mg | ORAL_TABLET | Freq: Every evening | ORAL | 0 refills | Status: DC | PRN

## 2019-11-19 MED ORDER — JUNEL FE 24 1-20 MG-MCG(24) PO TABS: ORAL_TABLET | ORAL | 4 refills | Status: DC

## 2019-11-19 NOTE — Progress Notes (Signed)
Patient ID: Karen Burton, female   DOB: 12/29/1975, 44 y.o.   MRN: [id number] History of Present Illness: Karen Burton is a 44 year old black female,single, G1P0010, in for a gyn exam.She had a normal pap with negative HPV 02/13/2019. She complains of gaining about 40 lbs in last year, and stomach seems bigger, pt told her she looked pregnant.And she has trouble sleeping.  PCP is Dr Mickie Hillier.  Current Medications, Allergies, Past Medical History, Past Surgical History, Family History and Social History were reviewed in [employer] record.     Review of Systems: Patient denies any headaches, hearing loss, fatigue, blurred vision, shortness of breath, chest pain, abdominal pain, problems with bowel movements, urination, or intercourse(not active). No joint pain or mood swings. 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  Has trouble sleeping, has tried melatonin  She says stomach is bigger, and pt told her she looked pregnant She says periods better on OCs, will skip at times    Physical Exam:BP 127/88 (BP Location: Right Arm, Patient Position: Sitting, Cuff Size: Large)   Pulse 94   Ht 5' 5.5" (1.664 m)   Wt 203 lb (92.1 kg)   LMP 11/11/2019   BMI 33.27 kg/m  General:  Well developed, well nourished, no acute distress Skin:  Warm and dry Neck:  Midline trachea, normal thyroid, good ROM, no lymphadenopathy Lungs; Clear to auscultation bilaterally Breast:  No dominant palpable mass, retraction, or nipple discharge Cardiovascular: Regular rate and rhythm Abdomen:  Soft, non tender, no hepatosplenomegaly Pelvic:  External genitalia is normal in appearance, no lesions.  The vagina is normal in appearance. Urethra has no lesions or masses. The cervix is smooth.  Uterus is felt to be about 18 week size, bigger than last year..  No adnexal masses or  tenderness noted.Bladder is non tender, no masses felt. Rectal: Good sphincter tone, no polyps, or hemorrhoids felt.  Hemoccult negative. Extremities/musculoskeletal:  No swelling or varicosities noted, no clubbing or cyanosis Psych:  No mood changes, alert and cooperative,seems happy AA is 0 Fall risk is low PHQ 9 score is 7, no SI is on Cymbalta Examination chaperoned by Levy Pupa LPN   Impression and Plan: 1. Encounter for colorectal cancer screening Hemoccult negative  2. Fibroids Will get GYN [place] in about a week to assess, she is aware they feel bigger, will talk when results back   3. Weight gain Check TSH, had normal CBC and CMP with PCP  4. Sleep disturbance Will try ambien  5. Visit for gynecologic examination Physical in 1 year Pap in 2023 Mammogram yearly  6.Contraceptive management Will refill junel  Meds ordered this encounter  Medications  . JUNEL FE 24 1-20 MG-MCG(24) tablet    Sig: TAKE 1 TABLET BY MOUTH ONCE DAILY    Dispense:  84 tablet    Refill:  4    Order Specific Question:   Supervising Provider    Answer:   Elonda Husky, LUTHER H [[id number]]  . zolpidem (AMBIEN) 5 MG tablet    Sig: Take 1 tablet (5 mg total) by mouth at bedtime as needed for sleep.    Dispense:  30 tablet    Refill:  0    Order Specific Question:   Supervising Provider    Answer:   Tania Ade H [[id number]]

## 2019-11-19 NOTE — Telephone Encounter (Signed)
Patient called stating she was returning your call.  Patient was given the following information that was noted in her chart.  "The contrast is not required for MRI of the shoulder to evaluate for rotator cuff tendinopathy."  Patient was satisfied with information given.  Patient had no further questions.

## 2019-11-20 ENCOUNTER — Telehealth: Payer: Self-pay | Admitting: Rheumatology

## 2019-11-20 ENCOUNTER — Other Ambulatory Visit: Payer: Self-pay

## 2019-11-20 LAB — TSH: TSH: 1.39 u[IU]/mL (ref 0.450–4.500)

## 2019-11-20 MED ORDER — PREDNISONE 10 MG PO TABS
ORAL_TABLET | ORAL | 0 refills | Status: DC
Start: 2019-11-20 — End: 2019-12-11

## 2019-11-20 NOTE — Progress Notes (Signed)
I called patient and discussed results of the MRI.  It indicates rheumatoid arthritis flare.  She had no response to cortisone injection.  Please call in prednisone starting at 40 mg for 2 days and taper by 10 mg every 2 days and then 5 mg for 2 days.  Please call in the prescription at [facility] in [place].  Please schedule a follow-up appointment in 1 to 2 weeks to start on Arava.  Patient had neutropenia on methotrexate in the past.

## 2019-11-20 NOTE — Telephone Encounter (Signed)
Patient called to check if Dr. Estanislado Pandy received the results of shoulder MRI.  Please advise.

## 2019-11-24 ENCOUNTER — Encounter: Payer: Self-pay | Admitting: Rheumatology

## 2019-11-24 NOTE — Progress Notes (Signed)
Office Visit Note  Patient: Karen Burton             Date of Birth: 16-Dec-1975           MRN: [id number]             PCP: Mikey Kirschner, MD Referring: Mikey Kirschner, MD Visit Date: 11/25/2019 Occupation: @GUAROCC @  Subjective:  Medication Management (Not doing good, left shoulder pain)   History of Present Illness: Karen Burton is a 44 y.o. female with history of seropositive rheumatoid arthritis.  She continues to have pain and discomfort in the left shoulder joint.  She had an adequate response to cortisone injection.  She is having difficulty raising her arm.  She was given a prednisone taper last Friday which has not helped her much.  She had MRI of her shoulder joint which showed severe bone marrow edema in the distal clavicle consistent with inflammation or trauma.  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  None of the other joints are painful.  Activities of Daily Living:  Patient reports morning stiffness for 0 none.   Patient Denies nocturnal pain.  Difficulty dressing/grooming: Reports Difficulty climbing stairs: Denies Difficulty getting out of chair: Denies Difficulty using hands for taps, buttons, cutlery, and/or writing: Denies  Review of Systems  Constitutional: Positive for fatigue. Negative for night sweats, weight gain and weight loss.  HENT: Positive for mouth dryness. Negative for mouth sores, trouble swallowing, trouble swallowing and nose dryness.   Eyes: Positive for dryness. Negative for pain, redness and visual disturbance.  Respiratory: Negative for cough, shortness of breath and difficulty breathing.   Cardiovascular: Negative for chest pain, palpitations, hypertension, irregular heartbeat and swelling in legs/feet.  Gastrointestinal: Positive for constipation. Negative for blood in stool and diarrhea.  Endocrine: Negative for excessive thirst and increased urination.    Genitourinary: Negative for difficulty urinating and vaginal dryness.  Musculoskeletal: Positive for arthralgias, joint pain, muscle weakness and muscle tenderness. Negative for joint swelling, myalgias, morning stiffness and myalgias.  Skin: Negative for color change, rash, hair loss, skin tightness, ulcers and sensitivity to sunlight.  Allergic/Immunologic: Negative for susceptible to infections.  Neurological: Negative for dizziness, memory loss and night sweats.  Hematological: Negative for bruising/bleeding tendency and swollen glands.  Psychiatric/Behavioral: Positive for sleep disturbance. Negative for depressed mood. The patient is not nervous/anxious.     PMFS History:  Patient Active Problem List   Diagnosis Date Noted  . Encounter for colorectal cancer screening 11/19/2019  . Weight gain 11/19/2019  . Sleep disturbance 11/19/2019  . Visit for gynecologic examination 11/19/2019  . Screening for colorectal cancer 02/13/2019  . Encounter for gynecological examination with Papanicolaou smear of cervix 02/13/2019  . Fibroids 02/13/2019  . Encounter for surveillance of contraceptive pills 08/12/2017  . Encounter for well woman exam with routine gynecological exam 08/12/2017  . ANA positive 11/06/2016  . Fibromyalgia 11/06/2016  . Menorrhagia with regular cycle 12/20/2015  . Dysmenorrhea 12/20/2015  . Uterine leiomyoma 12/20/2015  . Vulvar itching 12/20/2015  . Anemia 12/20/2015  . Essential hypertension 01/12/2013  . Esophageal reflux 01/12/2013  . Rheumatoid arthritis ([place]) 01/12/2013  . Rash and nonspecific skin eruption 01/12/2013    Past Medical History:  Diagnosis Date  . Anemia   . Arthritis   . Dysmenorrhea 12/20/2015  . Eczema    mild  . Fibroids 12/20/2015  . Fibromyalgia   . Menorrhagia  with regular cycle 12/20/2015  . Rash   . Reflux   . Rhinitis    chonic  . Vitamin D deficiency   . Vulvar itching 12/20/2015    Family History  Problem Relation Age of  Onset  . Diabetes Mother   . Hypertension Mother   . Asthma Mother   . Cancer Mother        breast  . Thyroid disease Mother   . Arthritis Mother   . Graves' disease Mother   . Cancer Maternal Grandmother        pancreatic  . COPD Maternal Aunt   . Asthma Maternal Aunt   . Gout Maternal Aunt    Past Surgical History:  Procedure Laterality Date  . ESOPHAGOGASTRODUODENOSCOPY ENDOSCOPY    . IR GENERIC HISTORICAL  02/07/2016   IR RADIOLOGIST EVAL & MGMT 02/07/2016 [name], MD [facility] INTERV RAD  . WISDOM TOOTH EXTRACTION     Social History   Social History Narrative  . Not on file   Immunization History  Administered Date(s) Administered  . DT (Pediatric) 02/20/2013  . Influenza-Unspecified 07/18/2009, 05/16/2016, 06/03/2017, 04/07/2018  . Pneumococcal Polysaccharide-23 05/08/2007     Objective: Vital Signs: BP 124/80 (BP Location: Left Arm, Patient Position: Sitting, Cuff Size: Normal)   Pulse 90   Resp 14   Ht 5\' 5"  (1.651 m)   Wt 202 lb 6.4 oz (91.8 kg)   LMP 11/11/2019   BMI 33.68 kg/m    Physical Exam Vitals and nursing note reviewed.  Constitutional:      Appearance: She is well-developed.  HENT:     Head: Normocephalic and atraumatic.  Eyes:     Conjunctiva/sclera: Conjunctivae normal.  Cardiovascular:     Rate and Rhythm: Normal rate and regular rhythm.     Heart sounds: Normal heart sounds.  Pulmonary:     Effort: Pulmonary effort is normal.     Breath sounds: Normal breath sounds.  Abdominal:     General: Bowel sounds are normal.     Palpations: Abdomen is soft.  Musculoskeletal:     Cervical back: Normal range of motion.  Lymphadenopathy:     Cervical: No cervical adenopathy.  Skin:    General: Skin is warm and dry.     Capillary Refill: Capillary refill takes less than 2 seconds.  Neurological:     Mental Status: She is alert and oriented to person, place, and time.  Psychiatric:        Behavior: Behavior normal.      Musculoskeletal  Exam: C-spine thoracic spine with good range of motion.  She had painful range of motion of her left shoulder joint with tenderness over the distal clavicle.  She has difficulty abducting her arm.  She has good range of motion of her elbows wrist joint MCPs PIPs DIPs with no synovitis.  Hip joints knee joints ankles MTPs PIPs were in good range of motion with no synovitis.  CDAI Exam: CDAI Score: 2.6  Patient Global: 10 mm; Provider Global: 6 mm Swollen: 0 ; Tender: 1  Joint Exam 11/25/2019      Right  Left  Glenohumeral      Tender     Investigation: No additional findings.  Imaging: MR SHOULDER LEFT WO CONTRAST  Result Date: 11/20/2019 CLINICAL DATA:  Severe pain and discomfort in the left shoulder. EXAM: MRI OF THE LEFT SHOULDER WITHOUT CONTRAST TECHNIQUE: Multiplanar, multisequence MR imaging of the shoulder was performed. No intravenous contrast was administered. COMPARISON:  None. FINDINGS: Rotator cuff: Supraspinatus tendon is intact. Infraspinatus tendon is intact. Teres minor tendon is intact. Subscapularis tendon is intact. Muscles: No atrophy or abnormal signal of the muscles of the rotator cuff. Biceps long head:  Intact. Acromioclavicular Joint: Mild arthropathy of the acromioclavicular joint with severe marrow edema in the distal clavicle disc proportionate to the degree of arthropathy. Type II acromion. Trace subacromial/subdeltoid bursal fluid. Glenohumeral Joint: No joint effusion. No chondral defect. Labrum: Grossly intact, but evaluation is limited by lack of intraarticular fluid. Bones:  No acute fracture or dislocation. Other: No fluid collection or hematoma. IMPRESSION: 1. No rotator cuff tear of the left shoulder. 2. Mild arthropathy of the acromioclavicular joint with severe marrow edema in the distal clavicle disproportionate to the degree of arthropathy. This may be secondary to an inflammatory etiology such as rheumatoid arthritis versus secondary to direct trauma. No  osteolysis. Electronically Signed   By: Kathreen Devoid   On: 11/20/2019 12:50    Recent Labs: Lab Results  Component Value Date   WBC 5.5 10/23/2019   HGB 12.9 10/23/2019   PLT 255 10/23/2019   NA 138 10/23/2019   K 3.7 10/23/2019   CL 103 10/23/2019   CO2 28 10/23/2019   GLUCOSE 87 10/23/2019   BUN 9 10/23/2019   CREATININE 0.82 10/23/2019   BILITOT 0.4 10/23/2019   ALKPHOS 65 08/04/2018   AST 26 10/23/2019   ALT 26 10/23/2019   PROT 6.4 10/23/2019   ALBUMIN 4.1 08/04/2018   CALCIUM 9.0 10/23/2019   GFRAA 101 10/23/2019   QFTBGOLDPLUS NEGATIVE 10/23/2019    Speciality Comments: Prior therapy includes: methotrexate and Plaquenil (neutropenia) and Humira (painful injections)  Procedures:  Large Joint Inj: L subacromial bursa on 11/25/2019 11:52 AM Indications: pain Details: 27 G 1.5 in needle, posterior approach  Arthrogram: No  Medications: 40 mg triamcinolone acetonide 40 MG/ML; 1.5 mL lidocaine 1 % Aspirate: 0 mL Outcome: tolerated well, no immediate complications Procedure, treatment alternatives, risks and benefits explained, specific risks discussed. Consent was given by the patient. Immediately prior to procedure a time out was called to verify the correct patient, procedure, equipment, support staff and site/side marked as required. Patient was prepped and draped in the usual sterile fashion.     Allergies: Ceftin [cefuroxime axetil], Humira [adalimumab], and Methotrexate derivatives   Assessment / Plan:     Visit Diagnoses: Rheumatoid arthritis involving multiple sites with positive rheumatoid factor (HCC)-rheumatoid arthritis appears to be very well controlled except for the left shoulder joint pain.  She had no synovitis on my examination today.  High risk medication use - Simponi 50 mg every 28 days.  Her labs have been stable.  We will continue to monitor labs every 3 months.  We discussed possibly switching her to Humira subcu and Arava in case we think this  is due to her rheumatoid arthritis instead of the trauma after the orthopedic evaluation.  Acute pain of left shoulder -MRI shows severe bone marrow edema in the distal clavicle.  Patient had no response to prednisone taper.  I injected left glenohumeral joint about a month ago.  We decided to inject her left subacromial bursa with cortisone as described above.  She noticed some relief after the injection.  I am concerned that this patient required trauma to the joint about a month ago.  Before changing her medications I would like for her to have orthopedic evaluation.  Plan: Ambulatory referral to Orthopedic Surgery  Fibromyalgia-she continues to have some  generalized pain but not much.  Other fatigue  History of insomnia  Elevated CK  History of gastroesophageal reflux (GERD)  History of hypertension  Orders: Orders Placed This Encounter  Procedures  . Large Joint Inj: L subacromial bursa  . Ambulatory referral to Orthopedic Surgery   No orders of the defined types were placed in this encounter.     Follow-Up Instructions: Return in about 2 weeks (around 12/09/2019) for Rheumatoid arthritis.   Bo Merino, MD  Note - This record has been created using Editor, commissioning.  Chart creation errors have been sought, but may not always  have been located. Such creation errors do not reflect on  the standard of medical care.

## 2019-11-25 ENCOUNTER — Encounter: Payer: Self-pay | Admitting: Rheumatology

## 2019-11-25 ENCOUNTER — Encounter: Payer: Self-pay | Admitting: Physician Assistant

## 2019-11-25 ENCOUNTER — Telehealth: Payer: Self-pay | Admitting: Pharmacist

## 2019-11-25 ENCOUNTER — Ambulatory Visit: Payer: 59 | Admitting: Rheumatology

## 2019-11-25 ENCOUNTER — Other Ambulatory Visit: Payer: Self-pay

## 2019-11-25 VITALS — BP 124/80 | HR 90 | Resp 14 | Ht 65.0 in | Wt 202.4 lb

## 2019-11-25 DIAGNOSIS — M0579 Rheumatoid arthritis with rheumatoid factor of multiple sites without organ or systems involvement: Secondary | ICD-10-CM | POA: Diagnosis not present

## 2019-11-25 DIAGNOSIS — M25512 Pain in left shoulder: Secondary | ICD-10-CM | POA: Diagnosis not present

## 2019-11-25 DIAGNOSIS — R5383 Other fatigue: Secondary | ICD-10-CM | POA: Diagnosis not present

## 2019-11-25 DIAGNOSIS — Z79899 Other long term (current) drug therapy: Secondary | ICD-10-CM | POA: Diagnosis not present

## 2019-11-25 DIAGNOSIS — Z87898 Personal history of other specified conditions: Secondary | ICD-10-CM | POA: Diagnosis not present

## 2019-11-25 DIAGNOSIS — Z8719 Personal history of other diseases of the digestive system: Secondary | ICD-10-CM

## 2019-11-25 DIAGNOSIS — R748 Abnormal levels of other serum enzymes: Secondary | ICD-10-CM | POA: Diagnosis not present

## 2019-11-25 DIAGNOSIS — Z8679 Personal history of other diseases of the circulatory system: Secondary | ICD-10-CM | POA: Diagnosis not present

## 2019-11-25 DIAGNOSIS — M797 Fibromyalgia: Secondary | ICD-10-CM

## 2019-11-25 MED ORDER — TRIAMCINOLONE ACETONIDE 40 MG/ML IJ SUSP
40.0000 mg | INTRAMUSCULAR | Status: AC | PRN
  Administered 2019-11-25: 40 mg via INTRA_ARTICULAR

## 2019-11-25 MED ORDER — LIDOCAINE HCL 1 % IJ SOLN
1.5000 mL | INTRAMUSCULAR | Status: AC | PRN
Start: 2019-11-25 — End: 2019-11-25
  Administered 2019-11-25: 1.5 mL

## 2019-11-25 NOTE — Telephone Encounter (Signed)
Please start BIV for Humira for RA.  Patient has tried Simponi and methotrexate with inadequate response.  Plaquenil caused neutropenia.  She has been on Humira in the past with adequate response but developed injection site reactions and switched to Simponi.   Mariella Saa, PharmD, [place], [place] Clinical Specialty Pharmacist [phone]  11/25/2019 12:12 PM

## 2019-11-25 NOTE — Telephone Encounter (Signed)
Submitted a Prior Authorization request to [facility] for HUMIRA CF via Cover My Meds. Will update once we receive a response.  ([id number]) [id number]

## 2019-11-27 MED FILL — SIMPONI 50 MG/0.5ML SOAJ: 50 | 27 days supply | Qty: 1 | Fill #1

## 2019-11-30 NOTE — Telephone Encounter (Signed)
Received notification from [facility] regarding a prior authorization for HUMIRA. Authorization has been APPROVED from 11/27/19 to 05/27/20.   Authorization # [id number] Phone # [phone]  Patient has commercial insurance and can use a copay card. Patient must will through [facility].  Patient has a $5.00 copay for a 1 month supply.Karen Burton

## 2019-12-01 NOTE — Telephone Encounter (Signed)
Humira new start pending orthopedic appointment.  Will discuss at next follow up appointment.   Mariella Saa, PharmD, [place], [place] Clinical Specialty Pharmacist [phone]  12/01/2019 11:13 AM

## 2019-12-03 ENCOUNTER — Encounter: Payer: Self-pay | Admitting: Family Medicine

## 2019-12-03 ENCOUNTER — Other Ambulatory Visit: Payer: Self-pay | Admitting: Family Medicine

## 2019-12-03 NOTE — Progress Notes (Deleted)
Office Visit Note  Patient: Karen Burton             Date of Birth: 11/30/1975           MRN: [id number]             PCP: Mikey Kirschner, MD Referring: Mikey Kirschner, MD Visit Date: 12/10/2019 Occupation: @GUAROCC @  Subjective:  No chief complaint on file.   History of Present Illness: Karen Burton is a 44 y.o. female ***   Activities of Daily Living:  Patient reports morning stiffness for *** {minute/hour:19697}.   Patient {ACTIONS;DENIES/REPORTS:21021675::"Denies"} nocturnal pain.  Difficulty dressing/grooming: {ACTIONS;DENIES/REPORTS:21021675::"Denies"} Difficulty climbing stairs: {ACTIONS;DENIES/REPORTS:21021675::"Denies"} Difficulty getting out of chair: {ACTIONS;DENIES/REPORTS:21021675::"Denies"} Difficulty using hands for taps, buttons, cutlery, and/or writing: {ACTIONS;DENIES/REPORTS:21021675::"Denies"}  No Rheumatology ROS completed.   PMFS History:  Patient Active Problem List   Diagnosis Date Noted  . Encounter for colorectal cancer screening 11/19/2019  . Weight gain 11/19/2019  . Sleep disturbance 11/19/2019  . Visit for gynecologic examination 11/19/2019  . Screening for colorectal cancer 02/13/2019  . Encounter for gynecological examination with Papanicolaou smear of cervix 02/13/2019  . Fibroids 02/13/2019  . Encounter for surveillance of contraceptive pills 08/12/2017  . Encounter for well woman exam with routine gynecological exam 08/12/2017  . ANA positive 11/06/2016  . Fibromyalgia 11/06/2016  . Menorrhagia with regular cycle 12/20/2015  . Dysmenorrhea 12/20/2015  . Uterine leiomyoma 12/20/2015  . Vulvar itching 12/20/2015  . Anemia 12/20/2015  . Essential hypertension 01/12/2013  . Esophageal reflux 01/12/2013  . Rheumatoid arthritis ([place]) 01/12/2013  . Rash and nonspecific skin eruption 01/12/2013    Past Medical History:  Diagnosis Date  . Anemia   . Arthritis   . Dysmenorrhea 12/20/2015  . Eczema    mild  . Fibroids  12/20/2015  . Fibromyalgia   . Menorrhagia with regular cycle 12/20/2015  . Rash   . Reflux   . Rhinitis    chonic  . Vitamin D deficiency   . Vulvar itching 12/20/2015    Family History  Problem Relation Age of Onset  . Diabetes Mother   . Hypertension Mother   . Asthma Mother   . Cancer Mother        breast  . Thyroid disease Mother   . Arthritis Mother   . Graves' disease Mother   . Cancer Maternal Grandmother        pancreatic  . COPD Maternal Aunt   . Asthma Maternal Aunt   . Gout Maternal Aunt    Past Surgical History:  Procedure Laterality Date  . ESOPHAGOGASTRODUODENOSCOPY ENDOSCOPY    . IR GENERIC HISTORICAL  02/07/2016   IR RADIOLOGIST EVAL & MGMT 02/07/2016 [name], MD [facility] INTERV RAD  . WISDOM TOOTH EXTRACTION     Social History   Social History Narrative  . Not on file   Immunization History  Administered Date(s) Administered  . DT (Pediatric) 02/20/2013  . Influenza-Unspecified 07/18/2009, 05/16/2016, 06/03/2017, 04/07/2018  . Pneumococcal Polysaccharide-23 05/08/2007     Objective: Vital Signs: LMP 11/11/2019    Physical Exam   Musculoskeletal Exam: ***  CDAI Exam: CDAI Score: -- Patient Global: --; Provider Global: -- Swollen: --; Tender: -- Joint Exam 12/10/2019   No joint exam has been documented for this visit   There is currently no information documented on the homunculus. Go to the Rheumatology activity and complete the homunculus joint exam.  Investigation: No additional findings.  Imaging: MR SHOULDER LEFT WO CONTRAST  Result  Date: 11/20/2019 CLINICAL DATA:  Severe pain and discomfort in the left shoulder. EXAM: MRI OF THE LEFT SHOULDER WITHOUT CONTRAST TECHNIQUE: Multiplanar, multisequence MR imaging of the shoulder was performed. No intravenous contrast was administered. COMPARISON:  None. FINDINGS: Rotator cuff: Supraspinatus tendon is intact. Infraspinatus tendon is intact. Teres minor tendon is intact. Subscapularis  tendon is intact. Muscles: No atrophy or abnormal signal of the muscles of the rotator cuff. Biceps long head:  Intact. Acromioclavicular Joint: Mild arthropathy of the acromioclavicular joint with severe marrow edema in the distal clavicle disc proportionate to the degree of arthropathy. Type II acromion. Trace subacromial/subdeltoid bursal fluid. Glenohumeral Joint: No joint effusion. No chondral defect. Labrum: Grossly intact, but evaluation is limited by lack of intraarticular fluid. Bones:  No acute fracture or dislocation. Other: No fluid collection or hematoma. IMPRESSION: 1. No rotator cuff tear of the left shoulder. 2. Mild arthropathy of the acromioclavicular joint with severe marrow edema in the distal clavicle disproportionate to the degree of arthropathy. This may be secondary to an inflammatory etiology such as rheumatoid arthritis versus secondary to direct trauma. No osteolysis. Electronically Signed   By: Kathreen Devoid   On: 11/20/2019 12:50    Recent Labs: Lab Results  Component Value Date   WBC 5.5 10/23/2019   HGB 12.9 10/23/2019   PLT 255 10/23/2019   NA 138 10/23/2019   K 3.7 10/23/2019   CL 103 10/23/2019   CO2 28 10/23/2019   GLUCOSE 87 10/23/2019   BUN 9 10/23/2019   CREATININE 0.82 10/23/2019   BILITOT 0.4 10/23/2019   ALKPHOS 65 08/04/2018   AST 26 10/23/2019   ALT 26 10/23/2019   PROT 6.4 10/23/2019   ALBUMIN 4.1 08/04/2018   CALCIUM 9.0 10/23/2019   GFRAA 101 10/23/2019   QFTBGOLDPLUS NEGATIVE 10/23/2019    Speciality Comments: Prior therapy includes: methotrexate and Plaquenil (neutropenia) and Humira (painful injections)  Procedures:  No procedures performed Allergies: Ceftin [cefuroxime axetil], Humira [adalimumab], and Methotrexate derivatives   Assessment / Plan:     Visit Diagnoses: No diagnosis found.  Orders: No orders of the defined types were placed in this encounter.  No orders of the defined types were placed in this  encounter.   Face-to-face time spent with patient was *** minutes. Greater than 50% of time was spent in counseling and coordination of care.  Follow-Up Instructions: No follow-ups on file.   Ofilia Neas, PA-C  Note - This record has been created using Dragon software.  Chart creation errors have been sought, but may not always  have been located. Such creation errors do not reflect on  the standard of medical care.

## 2019-12-04 ENCOUNTER — Other Ambulatory Visit: Payer: Self-pay | Admitting: Family Medicine

## 2019-12-04 ENCOUNTER — Telehealth: Payer: Self-pay | Admitting: Family Medicine

## 2019-12-04 MED ORDER — HYDROCHLOROTHIAZIDE 12.5 MG PO TABS: 12.5000 mg | ORAL_TABLET | Freq: Every day | ORAL | 0 refills | Status: DC

## 2019-12-04 MED ORDER — AMLODIPINE BESYLATE 5 MG PO TABS: 5.0000 mg | ORAL_TABLET | Freq: Every day | ORAL | 0 refills | Status: DC

## 2019-12-04 NOTE — Telephone Encounter (Signed)
Seen 07/22/2019. Please advise. Thank you

## 2019-12-04 NOTE — Telephone Encounter (Signed)
Pt contacted and verbalized understanding.  

## 2019-12-04 NOTE — Telephone Encounter (Signed)
Patient is requesting refill on hydrochlorothiazide 12.5 mg . She has a medication follow up on 5/14. [place] [facility] in [place]

## 2019-12-10 ENCOUNTER — Ambulatory Visit: Payer: 59 | Admitting: Physician Assistant

## 2019-12-11 ENCOUNTER — Other Ambulatory Visit: Payer: Self-pay | Admitting: Family Medicine

## 2019-12-11 ENCOUNTER — Other Ambulatory Visit: Payer: Self-pay

## 2019-12-11 ENCOUNTER — Telehealth (INDEPENDENT_AMBULATORY_CARE_PROVIDER_SITE_OTHER): Payer: 59 | Admitting: Family Medicine

## 2019-12-11 ENCOUNTER — Telehealth: Payer: Self-pay | Admitting: Family Medicine

## 2019-12-11 DIAGNOSIS — I10 Essential (primary) hypertension: Secondary | ICD-10-CM | POA: Diagnosis not present

## 2019-12-11 MED ORDER — LOSARTAN POTASSIUM 50 MG PO TABS: 50.0000 mg | ORAL_TABLET | Freq: Every day | ORAL | 1 refills | Status: DC

## 2019-12-11 MED ORDER — AMLODIPINE BESYLATE 5 MG PO TABS: 5.0000 mg | ORAL_TABLET | Freq: Every day | ORAL | 1 refills | Status: DC

## 2019-12-11 MED ORDER — HYDROCHLOROTHIAZIDE 12.5 MG PO TABS: 12.5000 mg | ORAL_TABLET | Freq: Every day | ORAL | 1 refills | Status: DC

## 2019-12-11 NOTE — Telephone Encounter (Signed)
Ms. Karen Burton, Karen Burton are scheduled for a virtual visit with your provider today.    Just as we do with appointments in the office, we must obtain your consent to participate.  Your consent will be active for this visit and any virtual visit you may have with one of our providers in the next 365 days.    If you have a MyChart account, I can also send a copy of this consent to you electronically.  All virtual visits are billed to your insurance company just like a traditional visit in the office.  As this is a virtual visit, video technology does not allow for your provider to perform a traditional examination.  This may limit your provider's ability to fully assess your condition.  If your provider identifies any concerns that need to be evaluated in person or the need to arrange testing such as labs, EKG, etc, we will make arrangements to do so.    Although advances in technology are sophisticated, we cannot ensure that it will always work on either your end or our end.  If the connection with a video visit is poor, we may have to switch to a telephone visit.  With either a video or telephone visit, we are not always able to ensure that we have a secure connection.   I need to obtain your verbal consent now.   Are you willing to proceed with your visit today?   [facility] has provided verbal consent on 12/11/2019 for a virtual visit (video or telephone).   Vicente Males, LPN [id number]  [id number] AM

## 2019-12-11 NOTE — Progress Notes (Signed)
   Subjective:    Patient ID: Karen Burton, female    DOB: [birth year], 44 y.o.   MRN: [id number]  Hypertension This is a chronic problem. There are no compliance problems.   Pt states she has not had any issue with blood pressure. Takes all meds at night.   Virtual Visit via Telephone Note  I connected with Karen Burton on 12/11/19 at  8:20 AM EDT by telephone and verified that I am speaking with the correct person using two identifiers.  Location: Patient: home Provider: office   I discussed the limitations, risks, security and privacy concerns of performing an evaluation and management service by telephone and the availability of in person appointments. I also discussed with the patient that there may be a patient responsible charge related to this service. The patient expressed understanding and agreed to proceed.   History of Present Illness:    Observations/Objective:   Assessment and Plan:   Follow Up Instructions:    I discussed the assessment and treatment plan with the patient. The patient was provided an opportunity to ask questions and all were answered. The patient agreed with the plan and demonstrated an understanding of the instructions.   The patient was advised to call back or seek an in-person evaluation if the symptoms worsen or if the condition fails to improve as anticipated.  I provided 22 minutes of non-face-to-face time during this encounter.   Blood pressure medicine and blood pressure levels reviewed today with patient. Compliant with blood pressure medicine. States does not miss a dose. No obvious side effects. Blood pressure generally good when checked elsewhere. Watching salt intake.       Review of Systems No headache no chest pain no shortness of breath    Objective:   Physical Exam Virtual       Assessment & Plan:  Impression.  Hypertension good control discussed maintain same meds  2.  Rheumatoid arthritis.  Followed  by specialist.  Worsening shoulder pain.  Due to see orthopedist soon  Diet exercise discussed.  Medications refilled.

## 2019-12-14 ENCOUNTER — Other Ambulatory Visit: Payer: Self-pay

## 2019-12-14 ENCOUNTER — Ambulatory Visit (INDEPENDENT_AMBULATORY_CARE_PROVIDER_SITE_OTHER): Payer: 59

## 2019-12-14 ENCOUNTER — Ambulatory Visit: Payer: 59 | Admitting: Orthopedic Surgery

## 2019-12-14 DIAGNOSIS — M25512 Pain in left shoulder: Secondary | ICD-10-CM | POA: Diagnosis not present

## 2019-12-14 DIAGNOSIS — D259 Leiomyoma of uterus, unspecified: Secondary | ICD-10-CM | POA: Diagnosis not present

## 2019-12-14 DIAGNOSIS — M79602 Pain in left arm: Secondary | ICD-10-CM

## 2019-12-14 DIAGNOSIS — D219 Benign neoplasm of connective and other soft tissue, unspecified: Secondary | ICD-10-CM

## 2019-12-14 NOTE — Progress Notes (Signed)
PELVIC US TA/TV: enlarged heterogeneous anteverted uterus with mult fibroids, (#1) anterior intramural fibroid 6.1 x 5.3 x 5.2 cm,(#2) subserosal mid right fibroid 5.1 x 4.5 x 3.9 cm,(#3) posterior subserosal fibroid 4.7 x 3.8 x 4.5 cm,(#4) fundal pedunculated 2.5 x 2.1 x 2.6 cm,EEC 10.7 mm,normal ovaries,unable to slide ovaries,may be because of location,no pain during ultrasound,no free fluid

## 2019-12-15 ENCOUNTER — Telehealth: Payer: Self-pay | Admitting: Adult Health

## 2019-12-15 NOTE — Telephone Encounter (Signed)
Pt aware that US showed enlarged uterus with multiple fibroids, bigger that last year, she is going to think over options and let me know

## 2019-12-19 ENCOUNTER — Encounter: Payer: Self-pay | Admitting: Orthopedic Surgery

## 2019-12-19 NOTE — Progress Notes (Signed)
Office Visit Note   Patient: Karen Burton           Date of Birth: 08/12/1975           MRN: [id number] Visit Date: 12/14/2019 Requested by: Bo Merino, MD [address] [place],  [place] [address] PCP: Mikey Kirschner, MD  Subjective: Chief Complaint  Patient presents with  . Left Shoulder - Pain    HPI: Karen Burton is a 44 y.o. female who presents to the office complaining of left shoulder pain.  Patient notes that she had acute onset of pain when she woke up on 10/07/2019.  Pain is been progressively worsening since then.  She denies any injury or event that she can point to.  She has never had a previous injury or issue with this left shoulder.  Never had surgery on her left shoulder.  Pain wakes her up at night.  She localizes pain around the [facility] joint.  She notes that she has had 2 injections by Dr. Estanislado Pandy without too much sustained relief.  She has been injected in the posterior shoulder and the lateral shoulder but denies any AC joint injection.  She has never had any relief even for a day from these injections.  She has been taking Tylenol and Aleve without relief.  She had an MRI of the left shoulder on 11/19/2019 that revealed no rotator cuff tear of the left shoulder but does show mild arthropathy of the [facility] joint with severe marrow edema in the distal clavicle.  She has a history of rheumatoid arthritis.  She does admit to occasional neck pain and numbness and tingling in her hand at night but no weakness or radicular symptoms..                ROS:  All systems reviewed are negative as they relate to the chief complaint within the history of present illness.  Patient denies fevers or chills.  Assessment & Plan: Visit Diagnoses:  1. Left arm pain   2. Arthralgia of left acromioclavicular joint     Plan: Patient is a 44 year old female presents complaint of left shoulder pain.  She has had a left shoulder MRI that shows significant amount of edema  around the left [facility] joint.  She is most tender over this part of her shoulder.  She has no weakness on exam but does have significant pain with passive abduction of her arm as well as active abduction.  She has had multiple injections but never one in her [facility] joint.  Under ultrasound guidance, a cortisone injection was administered into the left AC joint today.  Plan for patient to follow-up in 4 weeks for clinical recheck.    Follow-Up Instructions: No follow-ups on file.   Orders:  Orders Placed This Encounter  Procedures  . XR Cervical Spine 2 or 3 views   No orders of the defined types were placed in this encounter.     Procedures: Medium Joint Inj: L acromioclavicular on 12/21/2019 11:43 AM Indications: pain and diagnostic evaluation Details: 27 G 1.5 in needle, ultrasound-guided superior approach Medications: 13.33 mg methylPREDNISolone acetate 40 MG/ML; 0.66 mL bupivacaine 0.25 %; 3 mL lidocaine 1 % Outcome: tolerated well, no immediate complications Procedure, treatment alternatives, risks and benefits explained, specific risks discussed. Consent was given by the patient. Immediately prior to procedure a time out was called to verify the correct patient, procedure, equipment, support staff and site/side marked as required. Patient was prepped  and draped in the usual sterile fashion.       Clinical Data: No additional findings.  Objective: Vital Signs: There were no vitals taken for this visit.  Physical Exam:  Constitutional: Patient appears well-developed HEENT:  Head: Normocephalic Eyes:EOM are normal Neck: Normal range of motion Cardiovascular: Normal rate Pulmonary/chest: Effort normal Neurologic: Patient is alert Skin: Skin is warm Psychiatric: Patient has normal mood and affect  Ortho Exam:  Left shoulder Exam Able to fully forward flex and abduct shoulder overhead.  Pain with abduction. No loss of ER relative to the other shoulder.  Good endpoint with  ER Significant tenderness palpation over the left AC joint.  No tenderness to palpation over the bicipital groove Good subscapularis, supraspinatus, and infraspinatus strength.  Pain elicited with infraspinatus/supraspinatus resistance testing. 5/5 grip strength, forearm pronation/supination, and bicep strength No significant tenderness to palpation throughout the axial cervical spine  Specialty Comments:  No specialty comments available.  Imaging: No results found.   PMFS History: Patient Active Problem List   Diagnosis Date Noted  . Encounter for colorectal cancer screening 11/19/2019  . Weight gain 11/19/2019  . Sleep disturbance 11/19/2019  . Visit for gynecologic examination 11/19/2019  . Screening for colorectal cancer 02/13/2019  . Encounter for gynecological examination with Papanicolaou smear of cervix 02/13/2019  . Fibroids 02/13/2019  . Encounter for surveillance of contraceptive pills 08/12/2017  . Encounter for well woman exam with routine gynecological exam 08/12/2017  . ANA positive 11/06/2016  . Fibromyalgia 11/06/2016  . Menorrhagia with regular cycle 12/20/2015  . Dysmenorrhea 12/20/2015  . Uterine leiomyoma 12/20/2015  . Vulvar itching 12/20/2015  . Anemia 12/20/2015  . Essential hypertension 01/12/2013  . Esophageal reflux 01/12/2013  . Rheumatoid arthritis ([place]) 01/12/2013  . Rash and nonspecific skin eruption 01/12/2013   Past Medical History:  Diagnosis Date  . Anemia   . Arthritis   . Dysmenorrhea 12/20/2015  . Eczema    mild  . Fibroids 12/20/2015  . Fibromyalgia   . Menorrhagia with regular cycle 12/20/2015  . Rash   . Reflux   . Rhinitis    chonic  . Vitamin D deficiency   . Vulvar itching 12/20/2015    Family History  Problem Relation Age of Onset  . Diabetes Mother   . Hypertension Mother   . Asthma Mother   . Cancer Mother        breast  . Thyroid disease Mother   . Arthritis Mother   . Graves' disease Mother   . Cancer  Maternal Grandmother        pancreatic  . COPD Maternal Aunt   . Asthma Maternal Aunt   . Gout Maternal Aunt     Past Surgical History:  Procedure Laterality Date  . ESOPHAGOGASTRODUODENOSCOPY ENDOSCOPY    . IR GENERIC HISTORICAL  02/07/2016   IR RADIOLOGIST EVAL & MGMT 02/07/2016 [name], MD [facility] INTERV RAD  . WISDOM TOOTH EXTRACTION     Social History   Occupational History  . Not on file  Tobacco Use  . Smoking status: Never Smoker  . Smokeless tobacco: Never Used  Substance and Sexual Activity  . Alcohol use: No  . Drug use: Never  . Sexual activity: Not Currently    Birth control/protection: Pill

## 2019-12-21 DIAGNOSIS — M25512 Pain in left shoulder: Secondary | ICD-10-CM | POA: Diagnosis not present

## 2019-12-21 DIAGNOSIS — M79602 Pain in left arm: Secondary | ICD-10-CM | POA: Diagnosis not present

## 2019-12-21 MED ORDER — METHYLPREDNISOLONE ACETATE 40 MG/ML IJ SUSP
13.3300 mg | INTRAMUSCULAR | Status: AC | PRN
  Administered 2019-12-21: 13.33 mg via INTRA_ARTICULAR

## 2019-12-21 MED ORDER — LIDOCAINE HCL 1 % IJ SOLN
3.0000 mL | INTRAMUSCULAR | Status: AC | PRN
  Administered 2019-12-21: 3 mL

## 2019-12-21 MED ORDER — BUPIVACAINE HCL 0.25 % IJ SOLN
0.6600 mL | INTRAMUSCULAR | Status: AC | PRN
  Administered 2019-12-21: .66 mL via INTRA_ARTICULAR

## 2019-12-24 ENCOUNTER — Other Ambulatory Visit: Payer: Self-pay

## 2019-12-25 ENCOUNTER — Other Ambulatory Visit: Payer: Self-pay

## 2019-12-25 ENCOUNTER — Other Ambulatory Visit: Payer: Self-pay | Admitting: Rheumatology

## 2019-12-25 MED ORDER — TRAZODONE HCL 50 MG PO TABS: 50.0000 mg | ORAL_TABLET | Freq: Every day | ORAL | 0 refills | Status: DC

## 2019-12-25 NOTE — Telephone Encounter (Signed)
Last Visit: 11/25/2019 Next visit: 01/25/2020  Last Fill: 03/19/2019  Okay to refill trazodone?

## 2019-12-25 NOTE — Telephone Encounter (Signed)
Last Visit: 11/25/2019 Next Visit: 01/25/2020  Okay to refill per Dr. Estanislado Pandy

## 2019-12-29 ENCOUNTER — Telehealth: Payer: Self-pay | Admitting: Rheumatology

## 2019-12-29 NOTE — Telephone Encounter (Signed)
Patient currently on Simponi.  She was approved for Humira.  Humira new start was pending orthopedic appointment and she saw Dr. Marlou Sa on 5/17.  Patient to continue Simponi or switch to Humira? Please advise.  If switching to Humira she can be placed on nurse schedule at next available appointment.

## 2019-12-29 NOTE — Telephone Encounter (Signed)
Please call patient to check how was her response to the cortisone injection by Dr. Marlou Sa.  Humira has been approved by her insurance.  If she wants to switch to Humira we can schedule an appt.with Amber Yopp to start Humira.

## 2019-12-29 NOTE — Telephone Encounter (Signed)
Attempted to contact the patient and left message for patient to call the office.  

## 2019-12-29 NOTE — Telephone Encounter (Signed)
Patient called requesting a return call to discuss which medication (Simponi or Humira) has been approved.  Patient states she is due to take her injection on 12/31/19.

## 2020-01-01 NOTE — Telephone Encounter (Signed)
Attempted to contact the patient and left message for patient to call the office.  

## 2020-01-04 NOTE — Telephone Encounter (Signed)
Patient states she has had a postive response. Patient states she has been taking it easy and has had some soreness. Patient has a follow up visit on 01/14/2020. Patient has been scheduled for a new start on 01/07/2020.

## 2020-01-05 NOTE — Progress Notes (Signed)
Pharmacy Note  Subjective:   Patient presents to clinic today to receive first dose of Humira. Patient was on Simponi subq injections with last injection on 11/30/2019.  Patient running a fever or have signs/symptoms of infection? No  Patient currently on antibiotics for the treatment of infection? No  Patient have any upcoming invasive procedures/surgeries? No  Objective: CMP     Component Value Date/Time   NA 138 10/23/2019 1210   NA 139 08/04/2018 1138   K 3.7 10/23/2019 1210   CL 103 10/23/2019 1210   CO2 28 10/23/2019 1210   GLUCOSE 87 10/23/2019 1210   BUN 9 10/23/2019 1210   BUN 10 08/04/2018 1138   CREATININE 0.82 10/23/2019 1210   CALCIUM 9.0 10/23/2019 1210   PROT 6.4 10/23/2019 1210   PROT 6.8 08/04/2018 1138   ALBUMIN 4.1 08/04/2018 1138   AST 26 10/23/2019 1210   ALT 26 10/23/2019 1210   ALKPHOS 65 08/04/2018 1138   BILITOT 0.4 10/23/2019 1210   BILITOT <0.2 08/04/2018 1138   GFRNONAA 87 10/23/2019 1210   GFRAA 101 10/23/2019 1210    CBC    Component Value Date/Time   WBC 5.5 10/23/2019 1210   RBC 4.36 10/23/2019 1210   HGB 12.9 10/23/2019 1210   HGB 13.2 08/04/2018 1138   HCT 38.7 10/23/2019 1210   HCT 38.3 08/04/2018 1138   PLT 255 10/23/2019 1210   PLT 255 08/04/2018 1138   MCV 88.8 10/23/2019 1210   MCV 88 08/04/2018 1138   MCH 29.6 10/23/2019 1210   MCHC 33.3 10/23/2019 1210   RDW 13.2 10/23/2019 1210   RDW 12.6 08/04/2018 1138   LYMPHSABS 1,810 10/23/2019 1210   LYMPHSABS 1.8 08/04/2018 1138   MONOABS 0.8 03/04/2018 2341   EOSABS 171 10/23/2019 1210   EOSABS 0.1 08/04/2018 1138   BASOSABS 28 10/23/2019 1210   BASOSABS 0.0 08/04/2018 1138    Baseline Immunosuppressant Therapy Labs TB GOLD Quantiferon TB Gold Latest Ref Rng & Units 10/23/2019  Quantiferon TB Gold Plus NEGATIVE NEGATIVE   Hepatitis Panel Negative 12/10/2006; update pending next labs  HIV Pending next labs  Immunoglobulins Pending next labs  SPEP Pending next  labs  G6PD No results found for: G6PDH TPMT No results found for: TPMT   Chest x-ray: No acute cardiopulmonary process seen. On 03/04/2018  Assessment/Plan:  Demonstrated proper injection technique with Humira demo pen.  Patient able to demonstrate proper injection technique using the teach back method.  Patient self injected in the right upper thigh with:  Sample Medication: Humira 40mg /0.59ml NDC: 1655-3748-27 Lot: [id number] Expiration: 12/2020  Patient tolerated well.  Observed for 30 mins in office for adverse reaction and none noted.   Patient is to return in 1 month for follow up appointment labs. Will also update some baseline immunosuppressant labs.  Standing orders placed. Prescription sent to [facility] required per insurance.  All questions encouraged and answered.  Instructed patient to call with any further questions or concerns.  Mariella Saa, PharmD, [facility] Rheumatology Clinical Pharmacist  01/05/2020 4:49 PM

## 2020-01-07 ENCOUNTER — Other Ambulatory Visit: Payer: Self-pay

## 2020-01-07 ENCOUNTER — Ambulatory Visit (INDEPENDENT_AMBULATORY_CARE_PROVIDER_SITE_OTHER): Payer: 59 | Admitting: Pharmacist

## 2020-01-07 ENCOUNTER — Ambulatory Visit (HOSPITAL_BASED_OUTPATIENT_CLINIC_OR_DEPARTMENT_OTHER): Payer: 59 | Admitting: Pharmacist

## 2020-01-07 VITALS — BP 136/92

## 2020-01-07 DIAGNOSIS — Z79899 Other long term (current) drug therapy: Secondary | ICD-10-CM

## 2020-01-07 DIAGNOSIS — M0579 Rheumatoid arthritis with rheumatoid factor of multiple sites without organ or systems involvement: Secondary | ICD-10-CM | POA: Diagnosis not present

## 2020-01-07 MED ORDER — HUMIRA PEN 40 MG/0.8ML ~~LOC~~ PNKT: 40.0000 mg | PEN_INJECTOR | SUBCUTANEOUS | 0 refills | Status: DC

## 2020-01-07 NOTE — Progress Notes (Signed)
°  S: Patient presents for review of their specialty medication therapy.  Patient is currently taking Humira for RA. Patient is managed by Dr. Estanislado Pandy for this.   Adherence: just started today. Tolerated well.   Efficacy: just started today.   Dosing:  Rheumatoid arthritis: SubQ: 40 mg every other week (may continue methotrexate, other nonbiologic DMARDS, corticosteroids, NSAIDs, and/or analgesics); patients not taking concomitant methotrexate may increase dose to 40 mg every week  Dose adjustments: Renal: no dose adjustments (has not been studied) Hepatic: no dose adjustments (has not been studied)  Drug-drug interactions: none identified   Screening: TB test: completed in March of this year; negative  Hepatitis: negative in 2008; update pending next labs   Monitoring: S/sx of infection: none  CBC: WNL 10/23/2019 S/sx of hypersensitivity: none  S/sx of malignancy: none  S/sx of heart failure: none  Other side effects: none  O:   Lab Results  Component Value Date   WBC 5.5 10/23/2019   HGB 12.9 10/23/2019   HCT 38.7 10/23/2019   MCV 88.8 10/23/2019   PLT 255 10/23/2019      Chemistry      Component Value Date/Time   NA 138 10/23/2019 1210   NA 139 08/04/2018 1138   K 3.7 10/23/2019 1210   CL 103 10/23/2019 1210   CO2 28 10/23/2019 1210   BUN 9 10/23/2019 1210   BUN 10 08/04/2018 1138   CREATININE 0.82 10/23/2019 1210      Component Value Date/Time   CALCIUM 9.0 10/23/2019 1210   ALKPHOS 65 08/04/2018 1138   AST 26 10/23/2019 1210   ALT 26 10/23/2019 1210   BILITOT 0.4 10/23/2019 1210   BILITOT <0.2 08/04/2018 1138       A/P: 1. Medication review: Patient currently on Humira for RA. Reviewed the medication with the patient, including the following: Humira is a TNF blocking agent indicated for ankylosing spondylitis, Crohn's disease, Hidradenitis suppurativa, psoriatic arthritis, plaque psoriasis, ulcerative colitis, and uveitis. Patient educated on  purpose, proper use and potential adverse effects of Humira. Possible adverse effects are increased risk of infections, headache, and injection site reactions. There is the possibility of an increased risk of malignancy but it is not well understood if this increased risk is due to there medication or the disease state. There are rare cases of pancytopenia and aplastic anemia. For SubQ injection at separate sites in the thigh or lower abdomen (avoiding areas within 2 inches of navel); rotate injection sites. May leave at room temperature for ~15 to 30 minutes prior to use; do not remove cap or cover while allowing product to reach room temperature. Do not use if solution is discolored or contains particulate matter. Do not administer to skin which is red, tender, bruised, hard, or that has scars, stretch marks, or psoriasis plaques. Needle cap of the prefilled syringe or needle cover for the adalimumab pen may contain latex. Prefilled pens and syringes are available for use by patients and the full amount of the syringe should be injected (self-administration); the vial is intended for institutional use only. Vials do not contain a preservative; discard unused portion. No recommendations for any changes at this time.   Benard Halsted, PharmD, [place] [phone]

## 2020-01-07 NOTE — Patient Instructions (Signed)
Remember the 5 C's:  COUNTER- leave on the counter at least 30 mins but up to overnight to bring medication to room temperature and prevent stinging  COLD- Placing something cold (like and ice gel pack or cold water bottle) on the injection site just before cleansing with alcohol may help reduce pain  CLARITIN- for the first two weeks of treatment or  the day of, the day before, and the day after injecting to minimize injection site reactions  CORTISONE CREAM- apply if injection site is irritated and itching  CALL ME- if injection site reaction is bigger than the size of your fist, looks infected, blisters, or develop hives   

## 2020-01-07 NOTE — Telephone Encounter (Signed)
Humira Copay card info:  Rx [id number] Rx [id number] Rx [id number] Rx [id number]-OHCP

## 2020-01-11 ENCOUNTER — Ambulatory Visit: Payer: 59 | Admitting: Orthopedic Surgery

## 2020-01-11 NOTE — Progress Notes (Signed)
Office Visit Note  Patient: Karen Burton             Date of Birth: 05/02/1976           MRN: [id number]             PCP: Mikey Kirschner, MD Referring: Mikey Kirschner, MD Visit Date: 01/25/2020 Occupation: '@GUAROCC'$ @  Subjective:  Medication monitoring  History of Present Illness: Karen Burton is a 44 y.o. female with history of seropositive rheumatoid arthritis and fibromyalgia.  Patient is on Humira 40 mg subcutaneous injections every 14 days.  She was started on Humira on 01/07/2020.  Patient reports that her most recent injection stung very badly.  She denies any injection site reaction.  She states that she is dreading the next injection due to the severity of discomfort with her last injection.  She reports that she has not noticed much improvement since switching from Simponi to Humira.  She continues to experiencing pain and stiffness in both hands first thing in the morning lasting about 30 minutes.  She states that her symptoms of carpal tunnel have also returned and she would like a new prescription for carpal tunnel night splints which have been effective in the past.  Patient reports that her left shoulder pain has improved.  She had a AC joint cortisone injection performed by Dr. Marlou Sa on 12/14/2019 which provided significant relief.  She continues to have some discomfort with abduction.   Activities of Daily Living:  Patient reports morning stiffness for 30 minutes.   Patient Reports nocturnal pain.  Difficulty dressing/grooming: Denies Difficulty climbing stairs: Reports Difficulty getting out of chair: Denies Difficulty using hands for taps, buttons, cutlery, and/or writing: Reports  Review of Systems  Constitutional: Positive for fatigue.  HENT: Negative for mouth sores, mouth dryness and nose dryness.   Eyes: Positive for dryness. Negative for pain and visual disturbance.  Respiratory: Negative for cough, hemoptysis, shortness of breath and difficulty  breathing.   Cardiovascular: Negative for chest pain, palpitations, hypertension and swelling in legs/feet.  Gastrointestinal: Positive for constipation and diarrhea. Negative for blood in stool.  Endocrine: Negative for increased urination.  Genitourinary: Negative for painful urination.  Musculoskeletal: Positive for arthralgias, joint pain, joint swelling, myalgias, morning stiffness, muscle tenderness and myalgias. Negative for muscle weakness.  Skin: Negative for color change, pallor, rash, hair loss, nodules/bumps, skin tightness, ulcers and sensitivity to sunlight.  Allergic/Immunologic: Negative for susceptible to infections.  Neurological: Negative for dizziness, numbness, headaches and weakness.  Hematological: Negative for swollen glands.  Psychiatric/Behavioral: Positive for depressed mood and sleep disturbance. The patient is nervous/anxious.     PMFS History:  Patient Active Problem List   Diagnosis Date Noted  . Encounter for colorectal cancer screening 11/19/2019  . Weight gain 11/19/2019  . Sleep disturbance 11/19/2019  . Visit for gynecologic examination 11/19/2019  . Screening for colorectal cancer 02/13/2019  . Encounter for gynecological examination with Papanicolaou smear of cervix 02/13/2019  . Fibroids 02/13/2019  . Encounter for surveillance of contraceptive pills 08/12/2017  . Encounter for well woman exam with routine gynecological exam 08/12/2017  . ANA positive 11/06/2016  . Fibromyalgia 11/06/2016  . Menorrhagia with regular cycle 12/20/2015  . Dysmenorrhea 12/20/2015  . Uterine leiomyoma 12/20/2015  . Vulvar itching 12/20/2015  . Anemia 12/20/2015  . Essential hypertension 01/12/2013  . Esophageal reflux 01/12/2013  . Rheumatoid arthritis ([place]) 01/12/2013  . Rash and nonspecific skin eruption 01/12/2013    Past Medical History:  Diagnosis Date  . Anemia   . Arthritis   . Dysmenorrhea 12/20/2015  . Eczema    mild  . Fibroids 12/20/2015  .  Fibromyalgia   . Menorrhagia with regular cycle 12/20/2015  . Rash   . Reflux   . Rhinitis    chonic  . Vitamin D deficiency   . Vulvar itching 12/20/2015    Family History  Problem Relation Age of Onset  . Diabetes Mother   . Hypertension Mother   . Asthma Mother   . Cancer Mother        breast  . Thyroid disease Mother   . Arthritis Mother   . Graves' disease Mother   . Cancer Maternal Grandmother        pancreatic  . COPD Maternal Aunt   . Asthma Maternal Aunt   . Gout Maternal Aunt    Past Surgical History:  Procedure Laterality Date  . ESOPHAGOGASTRODUODENOSCOPY ENDOSCOPY    . IR GENERIC HISTORICAL  02/07/2016   IR RADIOLOGIST EVAL & MGMT 02/07/2016 [name], MD [facility] INTERV RAD  . WISDOM TOOTH EXTRACTION     Social History   Social History Narrative  . Not on file   Immunization History  Administered Date(s) Administered  . DT (Pediatric) 02/20/2013  . Influenza-Unspecified 07/18/2009, 05/16/2016, 06/03/2017, 04/07/2018  . Pneumococcal Polysaccharide-23 05/08/2007     Objective: Vital Signs: BP 130/90 (BP Location: Left Arm, Patient Position: Sitting, Cuff Size: Normal)   Pulse 99   Resp 14   Ht '5\' 5"'$  (1.651 m)   Wt 207 lb 3.2 oz (94 kg)   BMI 34.48 kg/m    Physical Exam Vitals and nursing note reviewed.  Constitutional:      Appearance: She is well-developed.  HENT:     Head: Normocephalic and atraumatic.  Eyes:     Conjunctiva/sclera: Conjunctivae normal.  Pulmonary:     Effort: Pulmonary effort is normal.  Abdominal:     General: Bowel sounds are normal.     Palpations: Abdomen is soft.  Musculoskeletal:     Cervical back: Normal range of motion.  Lymphadenopathy:     Cervical: No cervical adenopathy.  Skin:    General: Skin is warm and dry.     Capillary Refill: Capillary refill takes less than 2 seconds.  Neurological:     Mental Status: She is alert and oriented to person, place, and time.  Psychiatric:        Behavior: Behavior  normal.      Musculoskeletal Exam: Generalized hyperalgesia and positive tender points on exam.  C-spine, thoracic spine, and lumbar spine good range of motion with no discomfort.  Right shoulder has full range of motion with no discomfort.  Left AC joint tenderness noted.  She has discomfort with left shoulder abduction and internal rotation.  Elbow joints, wrist joints, MCPs, PIPs, DIPs have good range of motion with no synovitis.  She has complete fist formation bilaterally.  Hip joints, knee joints, ankle joints, MTPs, PIPs, DIPs good range of motion with no synovitis.  No warmth or effusion of knee joints noted.  No tenderness or inflammation of ankle joints.  CDAI Exam: CDAI Score: -- Patient Global: --; Provider Global: -- Swollen: --; Tender: -- Joint Exam 01/25/2020   No joint exam has been documented for this visit   There is currently no information documented on the homunculus. Go to the Rheumatology activity and complete the homunculus joint exam.  Investigation: No additional findings.  Imaging: No  results found.  Recent Labs: Lab Results  Component Value Date   WBC 5.5 10/23/2019   HGB 12.9 10/23/2019   PLT 255 10/23/2019   NA 138 10/23/2019   K 3.7 10/23/2019   CL 103 10/23/2019   CO2 28 10/23/2019   GLUCOSE 87 10/23/2019   BUN 9 10/23/2019   CREATININE 0.82 10/23/2019   BILITOT 0.4 10/23/2019   ALKPHOS 65 08/04/2018   AST 26 10/23/2019   ALT 26 10/23/2019   PROT 6.4 10/23/2019   ALBUMIN 4.1 08/04/2018   CALCIUM 9.0 10/23/2019   GFRAA 101 10/23/2019   QFTBGOLDPLUS NEGATIVE 10/23/2019    Speciality Comments: Prior therapy includes: methotrexate and Plaquenil (neutropenia) and Humira (painful injections)  Procedures:  No procedures performed Allergies: Ceftin [cefuroxime axetil], Humira [adalimumab], and Methotrexate derivatives   Assessment / Plan:     Visit Diagnoses: Rheumatoid arthritis involving multiple sites with positive rheumatoid factor  ([place]): She has no synovitis on exam today.  She continues to experience pain and morning stiffness in both hands.  She has ongoing discomfort in the left shoulder.  She had a left AC joint cortisone injection performed on 12/14/2019 which provided significant relief.  She was started on Humira 40 mg subcutaneous injections every 14 days on 01/07/2020.  She has had 2 injections.  Her last Simponi injection was on 11/30/2019.  She has not noticed much improvement since switching from Simponi to Humira.  We discussed giving Humira more time.  According to the patient her insurance will not cover the new formulation of Humira but she has been experiencing severe stinging at the injection site.  She has tried icing the injection site before and after the injection without much relief.  She has been dreading her next injection due to the severity of pain.  She was given a sample of the new formulation of Humira today in the office.  We will try to get the new formulation of Humira covered by her insurance.  She is due to update lab work and plans on having labs drawn at her office next week.  Future orders for [facility] will be placed today.  She will continue on Humira 40 mg subcutaneous injections every 14 days.  She will follow-up in the office in 2 months to assess her response.  High risk medication use - Humira 40 mg subcutaneous injections once every 14 days.  Inadequate response to Simponi sq njections.  CBC and CMP were drawn on 10/23/2019.  She is due to update lab work today.  She would like to have her labs drawn at [place] at her office next week.  Future orders will be placed today.  TB gold was negative on 10/23/2019.  She will require updated baseline immunosuppressive labs including immunoglobulins, SPEP, hepatitis panel, and HIV.  Future orders will be placed today.  Acute pain of left shoulder - MRI shows severe bone marrow edema in the distal clavicle.  She was previously referred to Dr. Marlou Sa, and she had  a left AC joint cortisone injection performed on 12/14/2019.  Her discomfort improved significantly after the cortisone injection.  Her nocturnal pain resolved.  She has started to have some increased discomfort.  She has tenderness over the left AC joint.  She has discomfort with abduction and internal rotation of the left shoulder.  She was encouraged to follow-up with Dr. Marlou Sa if her symptoms persist or worsen.   Fibromyalgia: She has generalized hyperalgesia and positive tender points on exam.  She has been  experiencing generalized myalgias and muscle tenderness due to underlying fibromyalgia.  She continues to have chronic fatigue secondary to insomnia.  She takes trazodone as needed at bedtime to help her sleep.  She continues to take Cymbalta 60 mg 1 capsule by mouth daily.  She feels as though her depression has been worsening since she has not been sleeping well at night.  She plans on following up with her PCP to further discuss.  We discussed the importance of regular exercise and good sleep hygiene.  We also discussed a referral to integrative therapies so I will place this referral today.  We also discussed that she may benefit from massage therapy.  Other fatigue: Chronic and secondary to insomnia.  Discussed the importance of regular exercise.  History of insomnia: She takes trazodone by mouth at bedtime to help her sleep.  Good sleep hygiene was discussed.  Elevated CK: She is not having any muscle weakness at this time.  CK was 578 on 04/04/2018.  Future order for CK will be placed today for monitoring purposes.  Paresthesia of both hands: She has been experiencing intermittent paresthesias in both hands especially at night.  She has a history of carpal tunnel and feels that her symptoms have started to return.  She previously had carpal tunnel night splints which were effective but lost them.  She was given a prescription for bilateral carpal tunnel night splints.  She was advised to notify  us if her symptoms persist or worsen.  Other medical conditions are listed as follows:  History of hypertension  History of gastroesophageal reflux (GERD)  Orders: Orders Placed This Encounter  Procedures  . CBC with Differential/Platelet  . CK  . CMP14+EGFR  . Ambulatory referral to Physical Therapy   No orders of the defined types were placed in this encounter.   Face-to-face time spent with patient was 30  minutes. Greater than 50% of time was spent in counseling and coordination of care.  Follow-Up Instructions: Return in about 2 months (around 03/26/2020) for Rheumatoid arthritis.   Ofilia Neas, PA-C  Note - This record has been created using Dragon software.  Chart creation errors have been sought, but may not always  have been located. Such creation errors do not reflect on  the standard of medical care.

## 2020-01-14 ENCOUNTER — Other Ambulatory Visit: Payer: Self-pay

## 2020-01-14 ENCOUNTER — Other Ambulatory Visit: Payer: Self-pay | Admitting: Pharmacist

## 2020-01-14 ENCOUNTER — Encounter: Payer: Self-pay | Admitting: Orthopedic Surgery

## 2020-01-14 ENCOUNTER — Ambulatory Visit: Payer: 59 | Admitting: Orthopedic Surgery

## 2020-01-14 DIAGNOSIS — M25512 Pain in left shoulder: Secondary | ICD-10-CM | POA: Diagnosis not present

## 2020-01-14 DIAGNOSIS — M0579 Rheumatoid arthritis with rheumatoid factor of multiple sites without organ or systems involvement: Secondary | ICD-10-CM

## 2020-01-14 MED ORDER — HUMIRA PEN 40 MG/0.8ML ~~LOC~~ PNKT: 40.0000 mg | PEN_INJECTOR | SUBCUTANEOUS | 0 refills | Status: DC

## 2020-01-14 NOTE — Progress Notes (Signed)
Office Visit Note   Patient: Karen Burton           Date of Birth: 1976-06-09           MRN: [id number] Visit Date: 01/14/2020 Requested by: Mikey Kirschner, [address],  [place] [address] PCP: Mikey Kirschner, MD  Subjective: Chief Complaint  Patient presents with  . Left Shoulder - Follow-up    HPI: Karen Burton is a 44 y.o. female who presents to the office complaining of left shoulder pain.  Patient follows up from [facility] joint injection about 4 weeks ago.  She notes that it took about 2 days for the injection to take effect but it significantly helped her pain.  She still has some pain but it is rated 2-3 out of 10.  Pain is worse with abduction and crossing her arm across her body.  It is "much better overall".  She denies any significant neck pain, radicular pain, numbness/tingling, weakness of the arm.  Pain no longer wakes her up at night.  She still localizes pain to the [facility] joint.  She is taking occasional Tylenol and Advil for her pain.  She does not have to take this medication every day..                ROS:  All systems reviewed are negative as they relate to the chief complaint within the history of present illness.  Patient denies fevers or chills.  Assessment & Plan: Visit Diagnoses: No diagnosis found.  Plan: Patient is a 44 year old female presents for follow-up visit for reevaluation of AC joint arthralgia.  She has had significant pain relief from injection.  Pain has recurred minorly, but overall does not interfere with her daily life.  She has tenderness over the [facility] joint on exam but excellent strength.  Plan for patient to use oral over-the-counter medications as well as topical Voltaren gel to control her symptoms as needed.  If her pain flares up again she may return for another injection.  Follow-up as needed.  Follow-Up Instructions: No follow-ups on file.   Orders:  No orders of the defined types were placed in this  encounter.  No orders of the defined types were placed in this encounter.     Procedures: No procedures performed   Clinical Data: No additional findings.  Objective: Vital Signs: There were no vitals taken for this visit.  Physical Exam:  Constitutional: Patient appears well-developed HEENT:  Head: Normocephalic Eyes:EOM are normal Neck: Normal range of motion Cardiovascular: Normal rate Pulmonary/chest: Effort normal Neurologic: Patient is alert Skin: Skin is warm Psychiatric: Patient has normal mood and affect  Ortho Exam:   Left shoulder Exam Able to fully forward flex and abduct shoulder overhead No loss of ER relative to the other shoulder.  Good endpoint with ER Moderately tender to palpation over the [facility] joint.  Pain worse with abduction and crossarm adduction Good subscapularis, supraspinatus, and infraspinatus strength Negative Hawkins impingement 5/5 grip strength, forearm pronation/supination, and bicep strength No pain with cervical range of motion.  No significant tenderness to palpation throughout the axial cervical spine  Specialty Comments:  No specialty comments available.  Imaging: No results found.   PMFS History: Patient Active Problem List   Diagnosis Date Noted  . Encounter for colorectal cancer screening 11/19/2019  . Weight gain 11/19/2019  . Sleep disturbance 11/19/2019  . Visit for gynecologic examination 11/19/2019  . Screening for colorectal cancer 02/13/2019  .  Encounter for gynecological examination with Papanicolaou smear of cervix 02/13/2019  . Fibroids 02/13/2019  . Encounter for surveillance of contraceptive pills 08/12/2017  . Encounter for well woman exam with routine gynecological exam 08/12/2017  . ANA positive 11/06/2016  . Fibromyalgia 11/06/2016  . Menorrhagia with regular cycle 12/20/2015  . Dysmenorrhea 12/20/2015  . Uterine leiomyoma 12/20/2015  . Vulvar itching 12/20/2015  . Anemia 12/20/2015  . Essential  hypertension 01/12/2013  . Esophageal reflux 01/12/2013  . Rheumatoid arthritis ([place]) 01/12/2013  . Rash and nonspecific skin eruption 01/12/2013   Past Medical History:  Diagnosis Date  . Anemia   . Arthritis   . Dysmenorrhea 12/20/2015  . Eczema    mild  . Fibroids 12/20/2015  . Fibromyalgia   . Menorrhagia with regular cycle 12/20/2015  . Rash   . Reflux   . Rhinitis    chonic  . Vitamin D deficiency   . Vulvar itching 12/20/2015    Family History  Problem Relation Age of Onset  . Diabetes Mother   . Hypertension Mother   . Asthma Mother   . Cancer Mother        breast  . Thyroid disease Mother   . Arthritis Mother   . Graves' disease Mother   . Cancer Maternal Grandmother        pancreatic  . COPD Maternal Aunt   . Asthma Maternal Aunt   . Gout Maternal Aunt     Past Surgical History:  Procedure Laterality Date  . ESOPHAGOGASTRODUODENOSCOPY ENDOSCOPY    . IR GENERIC HISTORICAL  02/07/2016   IR RADIOLOGIST EVAL & MGMT 02/07/2016 [name], MD [facility] INTERV RAD  . WISDOM TOOTH EXTRACTION     Social History   Occupational History  . Not on file  Tobacco Use  . Smoking status: Never Smoker  . Smokeless tobacco: Never Used  Vaping Use  . Vaping Use: Never used  Substance and Sexual Activity  . Alcohol use: No  . Drug use: Never  . Sexual activity: Not Currently    Birth control/protection: Pill

## 2020-01-18 ENCOUNTER — Telehealth: Payer: Self-pay | Admitting: Adult Health

## 2020-01-18 NOTE — Telephone Encounter (Signed)
Patient called stating that she would like a call back from [place]. Pt states that she has a question she would like to ask. Pt did not states the question. Please contact pt

## 2020-01-18 NOTE — Telephone Encounter (Signed)
Left message @ 3:07 pm. JSY

## 2020-01-19 NOTE — Telephone Encounter (Signed)
Spoke with pt. Encounter closed. [place]

## 2020-01-20 ENCOUNTER — Encounter: Payer: Self-pay | Admitting: Rheumatology

## 2020-01-20 MED FILL — HUMIRA PEN 40 MG/0.8ML PNKT: 40 | 28 days supply | Qty: 2 | Fill #0

## 2020-01-25 ENCOUNTER — Other Ambulatory Visit: Payer: Self-pay

## 2020-01-25 ENCOUNTER — Encounter: Payer: Self-pay | Admitting: Physician Assistant

## 2020-01-25 ENCOUNTER — Ambulatory Visit: Payer: 59 | Admitting: Physician Assistant

## 2020-01-25 ENCOUNTER — Telehealth: Payer: Self-pay

## 2020-01-25 VITALS — BP 130/90 | HR 99 | Resp 14 | Ht 65.0 in | Wt 207.2 lb

## 2020-01-25 DIAGNOSIS — M797 Fibromyalgia: Secondary | ICD-10-CM

## 2020-01-25 DIAGNOSIS — G5602 Carpal tunnel syndrome, left upper limb: Secondary | ICD-10-CM | POA: Diagnosis not present

## 2020-01-25 DIAGNOSIS — R5383 Other fatigue: Secondary | ICD-10-CM | POA: Diagnosis not present

## 2020-01-25 DIAGNOSIS — G5601 Carpal tunnel syndrome, right upper limb: Secondary | ICD-10-CM | POA: Diagnosis not present

## 2020-01-25 DIAGNOSIS — R202 Paresthesia of skin: Secondary | ICD-10-CM

## 2020-01-25 DIAGNOSIS — M0579 Rheumatoid arthritis with rheumatoid factor of multiple sites without organ or systems involvement: Secondary | ICD-10-CM

## 2020-01-25 DIAGNOSIS — Z87898 Personal history of other specified conditions: Secondary | ICD-10-CM | POA: Diagnosis not present

## 2020-01-25 DIAGNOSIS — Z8679 Personal history of other diseases of the circulatory system: Secondary | ICD-10-CM | POA: Diagnosis not present

## 2020-01-25 DIAGNOSIS — R748 Abnormal levels of other serum enzymes: Secondary | ICD-10-CM | POA: Diagnosis not present

## 2020-01-25 DIAGNOSIS — M25512 Pain in left shoulder: Secondary | ICD-10-CM

## 2020-01-25 DIAGNOSIS — Z79899 Other long term (current) drug therapy: Secondary | ICD-10-CM

## 2020-01-25 DIAGNOSIS — Z8719 Personal history of other diseases of the digestive system: Secondary | ICD-10-CM | POA: Diagnosis not present

## 2020-01-25 NOTE — Telephone Encounter (Signed)
Per Hazel Sams, PA-C, patient is experiencing intense pain/burning when injecting Humira. Can an appeal be filed for the patient to receive the citrate free formulation? Thanks!

## 2020-01-25 NOTE — Addendum Note (Signed)
Addended by: Earnestine Mealing on: 01/25/2020 03:09 PM   Modules accepted: Orders

## 2020-01-25 NOTE — Telephone Encounter (Signed)
Submitted a Prior Authorization request to [facility] for HUMIRACF via Cover My Meds. Will update once we receive a response.   (KeyMammie Russian) - [id number]

## 2020-01-25 NOTE — Addendum Note (Signed)
Addended by: Earnestine Mealing on: 01/25/2020 03:10 PM   Modules accepted: Orders

## 2020-01-28 NOTE — Progress Notes (Signed)
Medication Samples have been provided to the patient.  Drug name: Humira     Strength: 40 mg/ 0.4 mL     Qty: 1 LOT: [id number]  Exp.Date: 01/2021  Dosing instructions: Inject one pen into skin every 14 days.   The patient has been instructed regarding the correct time, dose, and frequency of taking this medication, including desired effects and most common side effects.   Karen Burton

## 2020-02-04 NOTE — Telephone Encounter (Signed)
Received fax from med impact stating they needed more information to process prior authorization.  Requested additional documentation faxed back to med impact.  We will update when we receive a response.   Mariella Saa, PharmD, [place], CPP Clinical Specialty Pharmacist (Rheumatology and Pulmonology)  02/04/2020 11:07 AM

## 2020-02-04 NOTE — Addendum Note (Signed)
Addended by: Mariella Saa C on: 02/04/2020 11:08 AM   Modules accepted: Orders

## 2020-02-10 ENCOUNTER — Other Ambulatory Visit: Payer: Self-pay

## 2020-02-10 ENCOUNTER — Telehealth: Payer: Self-pay | Admitting: Pharmacist

## 2020-02-10 DIAGNOSIS — M0579 Rheumatoid arthritis with rheumatoid factor of multiple sites without organ or systems involvement: Secondary | ICD-10-CM

## 2020-02-10 NOTE — Telephone Encounter (Signed)
Received a fax regarding Prior Authorization from [facility] for HUMIRA CF. Authorization has been DENIED because no documentation of allergic reaction such as hives, rash, or anaphylaxis or injection site reaction that was submitted to FDA med watch.  Discussed patient case with Hazel Sams, PA and will look into Enbrel as an option as patient does not want to use Humira original presentation.  We will start benefits investigation for Enbrel and contact patient once we receive a response.  Mariella Saa, PharmD, [place], CPP Clinical Specialty Pharmacist (Rheumatology and Pulmonology)  02/10/2020 8:45 AM

## 2020-02-10 NOTE — Telephone Encounter (Signed)
Received notification from [facility] regarding a prior authorization for ENBREL Mini. Authorization has been APPROVED from 02/10/2020 to 08/11/2020.   Authorization # [id number]  Mariella Saa, PharmD, BCACP, CPP Clinical Specialty Pharmacist (Rheumatology and Pulmonology)  02/10/2020 11:00 AM

## 2020-02-10 NOTE — Telephone Encounter (Signed)
Submitted a Prior Authorization request to [facility] for ENBREL via Cover My Meds. Will update once we receive a response.   (Key: [id number]) - [id number]

## 2020-02-10 NOTE — Telephone Encounter (Signed)
Ran test claim, patient's copay is $692.00 for 1 month supply. Patient can enroll into Enbrel Support for a copay card.

## 2020-02-10 NOTE — Telephone Encounter (Signed)
Please start benefits investigation for Enbrel for the treatment of rheumatoid arthritis.  Patient has tried and failed methotrexate, Plaquenil (neutropenia), Simponi and did not tolerate Humira.  Mariella Saa, PharmD, [place], CPP Clinical Specialty Pharmacist (Rheumatology and Pulmonology)  02/10/2020 8:47 AM

## 2020-02-11 NOTE — Telephone Encounter (Signed)
Please refill if indicated! 

## 2020-02-13 MED FILL — HUMIRA PEN 40 MG/0.8ML PNKT: 40 | 28 days supply | Qty: 2 | Fill #1

## 2020-02-15 NOTE — Telephone Encounter (Signed)
Called to notify patient of Humira denial and Enbrel approval.  No answer.  Left voicemail.  Requested return call to discuss.  Mariella Saa, PharmD, [place], CPP Clinical Specialty Pharmacist (Rheumatology and Pulmonology)  02/15/2020 12:01 PM

## 2020-02-24 ENCOUNTER — Telehealth: Payer: Self-pay | Admitting: Rheumatology

## 2020-02-24 ENCOUNTER — Other Ambulatory Visit: Payer: Self-pay | Admitting: Rheumatology

## 2020-02-24 NOTE — Telephone Encounter (Signed)
Attempted to contact the patient and left message for patient to call the office.  

## 2020-02-24 NOTE — Telephone Encounter (Signed)
Last Visit: 01/25/2020 Next Visit: 03/28/2020  Okay to refill per Dr. Estanislado Pandy

## 2020-02-24 NOTE — Telephone Encounter (Signed)
Patient calling to see if Dr. Estanislado Pandy recommends her getting the COVID vaccine. Patient works for [employer]. Please call to advise.

## 2020-02-26 NOTE — Telephone Encounter (Signed)
COVID-19 vaccine recommendations:  COVID-19 vaccine is recommended for everyone (unless you are allergic to a vaccine component), even if you are on a medication that suppresses your immune system.  If you are on Methotrexate, Cellcept (mycophenolate), Rinvoq, Morrie Sheldon, and Olumiant- hold the medication for 1 week after each vaccine. Hold Methotrexate for 2 weeks after the single dose COVID-19 vaccine.  If you are on Orencia subcutaneous injection - hold medication one week prior to and one week after the first COVID-19 vaccine dose (only).  If you are on Orencia IV infusions- time vaccination administration so that the first COVID-19 vaccination will occur four weeks after the infusion and postpone the subsequent infusion by one week.  If you are on Cyclophosphamide or Rituxan infusions please contact your doctor prior to receiving the COVID-19 vaccine.  Do not take Tylenol or ant anti-inflammatory medications (NSAIDs) 24 hours prior to the COVID-19 vaccination.  There is no direct evidence about the efficacy of the COVID-19 vaccine in individuals who are on medications that suppress the immune system.  Even if you are fully vaccinated, and you are on any medications that suppress your immune system, please continue to wear a mask, maintain at least six feet social distance and practice hand hygiene.  If you test positive for COVID-19, please contact your PCP or our office to determine if you need an antibody infusion.    Patient verbalized understanding.

## 2020-03-14 ENCOUNTER — Telehealth: Payer: Self-pay | Admitting: Rheumatology

## 2020-03-14 ENCOUNTER — Encounter: Payer: Self-pay | Admitting: *Deleted

## 2020-03-14 ENCOUNTER — Other Ambulatory Visit: Payer: Self-pay | Admitting: *Deleted

## 2020-03-14 DIAGNOSIS — R748 Abnormal levels of other serum enzymes: Secondary | ICD-10-CM

## 2020-03-14 DIAGNOSIS — Z79899 Other long term (current) drug therapy: Secondary | ICD-10-CM

## 2020-03-14 NOTE — Telephone Encounter (Signed)
#  1.Patient request a call back to discuss changing medication. Amber mentioned to patient last time she spoke with her. #2. Patient needs to know when she is due for labs next? Please call to advise.

## 2020-03-14 NOTE — Telephone Encounter (Signed)
Spoke with patient and advised patient she is due for labs as soon as possible. Patient advised the medication that was mentioned to her was Enbrel. Patient requested information on the Enbrel. Patient was sent information on Enbrel via my chart. Patient will call back with her decision.

## 2020-03-14 NOTE — Telephone Encounter (Signed)
Patient is overdue for labs.  She resumed Humira but requested citrate-free which was denied through insurance even with appeal.  Left voicemail to discuss if she would like to proceed with Enbrel or remain on Humira with citrate back in July.

## 2020-03-15 DIAGNOSIS — Z79899 Other long term (current) drug therapy: Secondary | ICD-10-CM | POA: Diagnosis not present

## 2020-03-15 DIAGNOSIS — R748 Abnormal levels of other serum enzymes: Secondary | ICD-10-CM | POA: Diagnosis not present

## 2020-03-16 NOTE — Progress Notes (Signed)
All labs within normal limits except for slightly elevated serum creatinine.  Will monitor.  She can continue current regimen of Humira.

## 2020-03-16 NOTE — Progress Notes (Signed)
CK WNL

## 2020-03-16 NOTE — Progress Notes (Signed)
Office Visit Note  Patient: Karen Burton             Date of Birth: 1976-04-17           MRN: [id number]             PCP: Mikey Kirschner, MD (Inactive) Referring: Mikey Kirschner, MD Visit Date: 03/28/2020 Occupation: @GUAROCC @  Subjective:  Pain in both shoulders   History of Present Illness: Karen Burton is a 44 y.o. female with history of seropositive rheumatoid arthritis.  Patient is on Humira 40 mg 7 days injections every 14 days.  She continues to experience a severe burning sensation after each injection.  She has tried icing her thigh before and after but has not noticed much improvement.  She would like to consider switching to Enbrel.  Patient reports that she continues to have pain in both shoulder joints.  She states that she experiences nocturnal pain at night.  She is also been experiencing some trochanter bursitis of the left hip.  She has an upcoming appointment with Dr. Marlou Sa on 04/11/2020.  She states that she may have to proceed with arthroscopic surgery of the left shoulder.  [facility] her second visit on 03/30/2020.    Activities of Daily Living:  Patient reports morning stiffness for  1 hour.   Patient Reports nocturnal pain.  Difficulty dressing/grooming: Reports Difficulty climbing stairs: Reports Difficulty getting out of chair: Reports Difficulty using hands for taps, buttons, cutlery, and/or writing: Denies  Review of Systems  Constitutional: Positive for fatigue.  HENT: Positive for mouth dryness and nose dryness. Negative for mouth sores.   Eyes: Positive for dryness. Negative for pain and visual disturbance.  Respiratory: Negative for cough, hemoptysis, shortness of breath and difficulty breathing.   Cardiovascular: Negative for chest pain, palpitations, hypertension and swelling in legs/feet.  Gastrointestinal: Positive for constipation. Negative for blood in stool  and diarrhea.  Endocrine: Negative for increased urination.  Genitourinary: Negative for difficulty urinating and painful urination.  Musculoskeletal: Positive for arthralgias, joint pain, joint swelling, myalgias, morning stiffness, muscle tenderness and myalgias. Negative for muscle weakness.  Skin: Negative for color change, pallor, rash, hair loss, nodules/bumps, redness, skin tightness, ulcers and sensitivity to sunlight.  Allergic/Immunologic: Negative for susceptible to infections.  Neurological: Positive for headaches. Negative for dizziness and memory loss.  Hematological: Negative for swollen glands.  Psychiatric/Behavioral: Positive for sleep disturbance. Negative for depressed mood and confusion. The patient is not nervous/anxious.     PMFS History:  Patient Active Problem List   Diagnosis Date Noted  . Encounter for colorectal cancer screening 11/19/2019  . Weight gain 11/19/2019  . Sleep disturbance 11/19/2019  . Visit for gynecologic examination 11/19/2019  . Screening for colorectal cancer 02/13/2019  . Encounter for gynecological examination with Papanicolaou smear of cervix 02/13/2019  . Fibroids 02/13/2019  . Encounter for surveillance of contraceptive pills 08/12/2017  . Encounter for well woman exam with routine gynecological exam 08/12/2017  . ANA positive 11/06/2016  . Fibromyalgia 11/06/2016  . Menorrhagia with regular cycle 12/20/2015  . Dysmenorrhea 12/20/2015  . Uterine leiomyoma 12/20/2015  . Vulvar itching 12/20/2015  . Anemia 12/20/2015  . Essential hypertension 01/12/2013  . Esophageal reflux 01/12/2013  . Rheumatoid arthritis ([place]) 01/12/2013  . Rash and nonspecific skin eruption 01/12/2013    Past Medical History:  Diagnosis Date  . Anemia   . Arthritis   . Dysmenorrhea 12/20/2015  .  Eczema    mild  . Fibroids 12/20/2015  . Fibromyalgia   . Menorrhagia with regular cycle 12/20/2015  . Rash   . Reflux   . Rhinitis    chonic  . Vitamin D  deficiency   . Vulvar itching 12/20/2015    Family History  Problem Relation Age of Onset  . Diabetes Mother   . Hypertension Mother   . Asthma Mother   . Cancer Mother        breast  . Thyroid disease Mother   . Arthritis Mother   . Graves' disease Mother   . Cancer Maternal Grandmother        pancreatic  . COPD Maternal Aunt   . Asthma Maternal Aunt   . Gout Maternal Aunt    Past Surgical History:  Procedure Laterality Date  . ESOPHAGOGASTRODUODENOSCOPY ENDOSCOPY    . IR GENERIC HISTORICAL  02/07/2016   IR RADIOLOGIST EVAL & MGMT 02/07/2016 [name], MD [facility] INTERV RAD  . WISDOM TOOTH EXTRACTION     Social History   Social History Narrative  . Not on file   Immunization History  Administered Date(s) Administered  . DT (Pediatric) 02/20/2013  . Influenza-Unspecified 07/18/2009, 05/16/2016, 06/03/2017, 04/07/2018  . Pneumococcal Polysaccharide-23 05/08/2007     Objective: Vital Signs: BP 125/88 (BP Location: Right Arm, Patient Position: Sitting, Cuff Size: Normal)   Pulse 94   Resp 15   Ht 5\' 5"  (1.651 m)   Wt 208 lb 6.4 oz (94.5 kg)   BMI 34.68 kg/m    Physical Exam Vitals and nursing note reviewed.  Constitutional:      Appearance: She is well-developed.  HENT:     Head: Normocephalic and atraumatic.  Eyes:     Conjunctiva/sclera: Conjunctivae normal.  Pulmonary:     Effort: Pulmonary effort is normal.  Abdominal:     Palpations: Abdomen is soft.  Musculoskeletal:     Cervical back: Normal range of motion.  Lymphadenopathy:     Cervical: No cervical adenopathy.  Skin:    General: Skin is warm and dry.     Capillary Refill: Capillary refill takes less than 2 seconds.  Neurological:     Mental Status: She is alert and oriented to person, place, and time.  Psychiatric:        Behavior: Behavior normal.      Musculoskeletal Exam: C-spine, thoracic spine, and lumbar spine good ROM.  Right shoulder has full ROM with discomfort.  Left shoulder  limited abduction to 120 degrees.  Elbow joints, wrist joints, MCPs, PIPs, and DIPs good ROM with no synovitis.  Complete fist formation bilaterally.  Hip joints good ROM  Tenderness over the left trochanteric bursa. Knee joints and ankle joints good ROM with no tenderness or synovitis.   CDAI Exam: CDAI Score: -- Patient Global: --; Provider Global: -- Swollen: --; Tender: -- Joint Exam 03/28/2020   No joint exam has been documented for this visit   There is currently no information documented on the homunculus. Go to the Rheumatology activity and complete the homunculus joint exam.  Investigation: No additional findings.  Imaging: No results found.  Recent Labs: Lab Results  Component Value Date   WBC 5.5 03/15/2020   HGB 13.6 03/15/2020   PLT 271 03/15/2020   NA 142 03/15/2020   K 3.8 03/15/2020   CL 104 03/15/2020   CO2 24 03/15/2020   GLUCOSE 117 (H) 03/15/2020   BUN 11 03/15/2020   CREATININE 1.07 (H) 03/15/2020  BILITOT <0.2 03/15/2020   ALKPHOS 83 03/15/2020   AST 25 03/15/2020   ALT 29 03/15/2020   PROT 6.7 03/15/2020   ALBUMIN 4.0 03/15/2020   CALCIUM 9.3 03/15/2020   GFRAA 73 03/15/2020   QFTBGOLDPLUS NEGATIVE 10/23/2019    Speciality Comments: Prior therapy includes: methotrexate and Plaquenil (neutropenia) and Humira (painful injections)  Procedures:  No procedures performed Allergies: Ceftin [cefuroxime axetil], Humira [adalimumab], and Methotrexate derivatives   Assessment / Plan:     Visit Diagnoses: Rheumatoid arthritis involving multiple sites with positive rheumatoid factor ([place]): She has no synovitis on exam.  She has ongoing pain in both shoulder joints.  She had an MRI of the left shoulder performed on 11/19/2019 which revealed mild arthropathy of the acromioclavicular joint with severe marrow edema in the distal clavicle.  She was evaluated by Dr. Marlou Sa and had an [facility] joint injection performed on 12/14/2019.  Her discomfort has returned and has  been persistent.  She has limited abduction to about 120 degrees and has tenderness of the [facility] joint on exam.  She will be following up with Dr. Marlou Sa on 04/11/2020 for further evaluation.  She is not experiencing any other joint pain or inflammation at this time.  She is currently on Humira 40 mg subcutaneous injections every 14 days.  She continues to experience a severe burning sensation after each injection despite trying different methods to alleviate the injection site discomfort.  She would like to discuss switching to Enbrel.  Indications, contraindications, potential side effects of Enbrel were discussed today.  All questions were addressed and consent was obtained.  She still has 2 injections of Humira at home which she would like to complete prior to scheduling a nurse visit for the administration of the first Enbrel injection.  She was advised to notify us when she would like to schedule a nurse visit.  She was advised to notify us if she develops increased joint pain or joint swelling.  She will follow-up in the office in 3 months.   Medication counseling:   TB Test: negative 10/23/19  Hepatitis panel: negative on 03/15/20  HIV: negative 03/15/20  SPEP: WNL 03/15/20  Immunoglobulins: WNL on 03/15/20   Chest x-ray: no acute cardiopulmonary disease on 03/04/18   Does patient have diagnosis of heart failure?  No  Counseled patient that Enbrel is a TNF blocking agent.  Reviewed Enbrel dose of 50 mg once weekly.  Counseled patient on purpose, proper use, and adverse effects of Enbrel.  Reviewed the most common adverse effects including infections, headache, and injection site reactions. Discussed that there is the possibility of an increased risk of malignancy but it is not well understood if this increased risk is due to the medication or the disease state.  Advised patient to get yearly dermatology exams due to risk of skin cancer.  Reviewed the importance of regular labs while on Enbrel therapy.   Advised patient to get standing labs one month after starting Enbrel then every 2 months.  Provided patient with standing lab orders.  Counseled patient that Enbrel should be held prior to scheduled surgery.  Counseled patient to avoid live vaccines while on Enbrel.  Advised patient to get annual influenza vaccine and the pneumococcal vaccine as needed.  Provided patient with medication education material and answered all questions.  Patient voiced understanding.  Patient consented to Enbrel.  Will upload consent into the media tab.  Reviewed storage instructions for Enbrel.  Advised initial injection must be administered in office.  Patient voiced understanding.    High risk medication use -we will be applying for Enbrel 50 mg subcutaneous injections once weekly.  She has 2 injections of Humira labs which she plans on finishing prior to scheduling the nurse visit to switch to Enbrel.  She has been experiencing severe burning after each Humira injection.  Her insurance would not cover switching to citrate free Humira.  She previously had an inadequate response to injectable Simponi.   Acute pain of left shoulder - MRI shows severe bone marrow edema in the distal clavicle.  She was previously referred to Dr. Marlou Sa, and she had a left AC joint cortisone injection performed on 12/14/2019.  She continues to have persistent discomfort in the left shoulder.  She has limited abduction to about 120 degrees and has tenderness over the left AC joint.  She has been experiencing nocturnal pain intermittently.  She has an upcoming appointment with Dr. Marlou Sa on 04/11/2020 and is considering proceeding with arthroscopic surgery.  [facility].  Fibromyalgia: She has generalized hyperalgesia and positive tender points on exam.  She has been experiencing generalized muscle aches and muscle tenderness due to fibromyalgia.  She continues to have significant fatigue  secondary to insomnia.  She has been having worsening nocturnal pain which she feels is contributing.  She will occasionally takes Ambien or trazodone to help her sleep at night.  She continues to take Cymbalta as prescribed.  [facility] and is going for her second appointment on 03/30/2020.  We discussed the importance of regular exercise and good sleep hygiene.  Other fatigue: Chronic  History of insomnia: She takes Ambien or trazodone at bedtime as needed for insomnia.  Discussed the importance of good sleep hygiene.  Elevated CK: CK was 144 on 03/15/2020.  She is not experiencing any muscle weakness at this time.  Paresthesia of both hands: Resolved.  Other medical conditions are listed as follows:  History of gastroesophageal reflux (GERD)  History of hypertension  Orders: No orders of the defined types were placed in this encounter.  No orders of the defined types were placed in this encounter.   Face-to-face time spent with patient was 30 minutes. Greater than 50% of time was spent in counseling and coordination of care.  Follow-Up Instructions: Return in about 3 months (around 06/28/2020) for Rheumatoid arthritis, Fibromyalgia.   Ofilia Neas, PA-C  Note - This record has been created using Dragon software.  Chart creation errors have been sought, but may not always  have been located. Such creation errors do not reflect on  the standard of medical care.

## 2020-03-17 ENCOUNTER — Other Ambulatory Visit: Payer: Self-pay

## 2020-03-17 DIAGNOSIS — M0579 Rheumatoid arthritis with rheumatoid factor of multiple sites without organ or systems involvement: Secondary | ICD-10-CM

## 2020-03-17 LAB — CBC WITH DIFFERENTIAL/PLATELET
Basophils Absolute: 0 10*3/uL (ref 0.0–0.2)
Basos: 1 %
EOS (ABSOLUTE): 0.2 10*3/uL (ref 0.0–0.4)
Eos: 4 %
Hematocrit: 39.7 % (ref 34.0–46.6)
Hemoglobin: 13.6 g/dL (ref 11.1–15.9)
Immature Grans (Abs): 0 10*3/uL (ref 0.0–0.1)
Immature Granulocytes: 0 %
Lymphocytes Absolute: 2.1 10*3/uL (ref 0.7–3.1)
Lymphs: 38 %
MCH: 30.6 pg (ref 26.6–33.0)
MCHC: 34.3 g/dL (ref 31.5–35.7)
MCV: 89 fL (ref 79–97)
Monocytes Absolute: 0.7 10*3/uL (ref 0.1–0.9)
Monocytes: 13 %
Neutrophils Absolute: 2.5 10*3/uL (ref 1.4–7.0)
Neutrophils: 44 %
Platelets: 271 10*3/uL (ref 150–450)
RBC: 4.44 x10E6/uL (ref 3.77–5.28)
RDW: 11.9 % (ref 11.7–15.4)
WBC: 5.5 10*3/uL (ref 3.4–10.8)

## 2020-03-17 LAB — CK: Total CK: 144 U/L (ref 32–182)

## 2020-03-17 LAB — PROTEIN ELECTROPHORESIS, SERUM, WITH REFLEX
A/G Ratio: 1 (ref 0.7–1.7)
Albumin ELP: 3.4 g/dL (ref 2.9–4.4)
Alpha 1: 0.3 g/dL (ref 0.0–0.4)
Alpha 2: 0.7 g/dL (ref 0.4–1.0)
Beta: 1.3 g/dL (ref 0.7–1.3)
Gamma Globulin: 1.1 g/dL (ref 0.4–1.8)
Globulin, Total: 3.3 g/dL (ref 2.2–3.9)

## 2020-03-17 LAB — CMP14+EGFR
ALT: 29 IU/L (ref 0–32)
AST: 25 IU/L (ref 0–40)
Albumin/Globulin Ratio: 1.5 (ref 1.2–2.2)
Albumin: 4 g/dL (ref 3.8–4.8)
Alkaline Phosphatase: 83 IU/L (ref 48–121)
BUN/Creatinine Ratio: 10 (ref 9–23)
BUN: 11 mg/dL (ref 6–24)
Bilirubin Total: <0.2 mg/dL (ref 0.0–1.2)
CO2: 24 mmol/L (ref 20–29)
Calcium: 9.3 mg/dL (ref 8.7–10.2)
Chloride: 104 mmol/L (ref 96–106)
Creatinine, Ser: 1.07 mg/dL — ABNORMAL HIGH (ref 0.57–1.00)
GFR calc Af Amer: 73 mL/min/{1.73_m2} (ref >59)
GFR calc non Af Amer: 63 mL/min/{1.73_m2} (ref >59)
Globulin, Total: 2.7 g/dL (ref 1.5–4.5)
Glucose: 117 mg/dL — ABNORMAL HIGH (ref 65–99)
Potassium: 3.8 mmol/L (ref 3.5–5.2)
Sodium: 142 mmol/L (ref 134–144)
Total Protein: 6.7 g/dL (ref 6.0–8.5)

## 2020-03-17 LAB — IGG, IGA, IGM
IgA/Immunoglobulin A, Serum: 320 mg/dL (ref 87–352)
IgG (Immunoglobin G), Serum: 1109 mg/dL (ref 586–1602)
IgM (Immunoglobulin M), Srm: 41 mg/dL (ref 26–217)

## 2020-03-17 LAB — HEPATITIS PANEL, ACUTE
Hep A IgM: NEGATIVE
Hep B C IgM: NEGATIVE
Hep C Virus Ab: <0.1 s/co ratio (ref 0.0–0.9)
Hepatitis B Surface Ag: NEGATIVE

## 2020-03-17 LAB — HIV ANTIBODY (ROUTINE TESTING W REFLEX): HIV Screen 4th Generation wRfx: NONREACTIVE

## 2020-03-21 DIAGNOSIS — M25512 Pain in left shoulder: Secondary | ICD-10-CM | POA: Diagnosis not present

## 2020-03-21 DIAGNOSIS — M059 Rheumatoid arthritis with rheumatoid factor, unspecified: Secondary | ICD-10-CM | POA: Diagnosis not present

## 2020-03-21 DIAGNOSIS — M797 Fibromyalgia: Secondary | ICD-10-CM | POA: Diagnosis not present

## 2020-03-21 MED FILL — HUMIRA PEN 40 MG/0.8ML PNKT: 40 | 28 days supply | Qty: 2 | Fill #2

## 2020-03-25 ENCOUNTER — Ambulatory Visit: Payer: Self-pay

## 2020-03-28 ENCOUNTER — Ambulatory Visit: Payer: 59 | Admitting: Physician Assistant

## 2020-03-28 ENCOUNTER — Encounter: Payer: Self-pay | Admitting: Physician Assistant

## 2020-03-28 ENCOUNTER — Other Ambulatory Visit: Payer: Self-pay

## 2020-03-28 VITALS — BP 125/88 | HR 94 | Resp 15 | Ht 65.0 in | Wt 208.4 lb

## 2020-03-28 DIAGNOSIS — Z87898 Personal history of other specified conditions: Secondary | ICD-10-CM | POA: Diagnosis not present

## 2020-03-28 DIAGNOSIS — R5383 Other fatigue: Secondary | ICD-10-CM

## 2020-03-28 DIAGNOSIS — M0579 Rheumatoid arthritis with rheumatoid factor of multiple sites without organ or systems involvement: Secondary | ICD-10-CM

## 2020-03-28 DIAGNOSIS — M797 Fibromyalgia: Secondary | ICD-10-CM

## 2020-03-28 DIAGNOSIS — R748 Abnormal levels of other serum enzymes: Secondary | ICD-10-CM

## 2020-03-28 DIAGNOSIS — R202 Paresthesia of skin: Secondary | ICD-10-CM

## 2020-03-28 DIAGNOSIS — Z8719 Personal history of other diseases of the digestive system: Secondary | ICD-10-CM

## 2020-03-28 DIAGNOSIS — M25512 Pain in left shoulder: Secondary | ICD-10-CM | POA: Diagnosis not present

## 2020-03-28 DIAGNOSIS — Z79899 Other long term (current) drug therapy: Secondary | ICD-10-CM | POA: Diagnosis not present

## 2020-03-28 DIAGNOSIS — Z8679 Personal history of other diseases of the circulatory system: Secondary | ICD-10-CM

## 2020-03-28 NOTE — Patient Instructions (Signed)
Standing Labs We placed an order today for your standing lab work.   Please have your standing labs drawn in November and every 3 months   If possible, please have your labs drawn 2 weeks prior to your appointment so that the provider can discuss your results at your appointment.  We have open lab daily Monday through Thursday from 8:30-12:30 PM and 1:30-4:30 PM and Friday from 8:30-12:30 PM and 1:30-4:00 PM at the office of Dr. Bo Merino, [facility].   Please be advised, patients with office appointments requiring lab work will take precedents over walk-in lab work.  If possible, please come for your lab work on Monday and Friday afternoons, as you may experience shorter wait times. The office is located at [address], [place], [place], [place] [address] No appointment is necessary.   Labs are drawn by [facility]. Please bring your co-pay at the time of your lab draw.  You may receive a bill from [facility] for your lab work.  If you wish to have your labs drawn at another location, please call the office 24 hours in advance to send orders.  If you have any questions regarding directions or hours of operation,  please call [phone].   As a reminder, please drink plenty of water prior to coming for your lab work. Thanks!  Etanercept injection What is this medicine? ETANERCEPT (et a [employer]) is used for the treatment of rheumatoid arthritis in adults and children. The medicine is also used to treat psoriatic arthritis, ankylosing spondylitis, and psoriasis. This medicine may be used for other purposes; ask your health care provider or pharmacist if you have questions. COMMON BRAND NAME(S): Enbrel What should I tell my health care provider before I take this medicine? They need to know if you have any of these conditions:  blood disorders  cancer  congestive heart failure  diabetes  exposure to chickenpox  immune system problems  infection  multiple  sclerosis  seizure disorder  tuberculosis, a positive skin test for tuberculosis or have recently been in close contact with someone who has tuberculosis  Wegener's granulomatosis  an unusual or allergic reaction to etanercept, latex, other medicines, foods, dyes, or preservatives  pregnant or trying to get pregnant  breast-feeding How should I use this medicine? The medicine is given by injection under the skin. You will be taught how to prepare and give this medicine. Use exactly as directed. Take your medicine at regular intervals. Do not take your medicine more often than directed. It is important that you put your used needles and syringes in a special sharps container. Do not put them in a trash can. If you do not have a sharps container, call your pharmacist or healthcare provider to get one. A special MedGuide will be given to you by the pharmacist with each prescription and refill. Be sure to read this information carefully each time. Talk to your pediatrician regarding the use of this medicine in children. While this drug may be prescribed for children as young as 76 years of age for selected conditions, precautions do apply. Overdosage: If you think you have taken too much of this medicine contact a poison control center or emergency room at once. NOTE: This medicine is only for you. Do not share this medicine with others. What if I miss a dose? If you miss a dose, contact your health care professional to find out when you should take your next dose. Do not take double or extra  doses without advice. What may interact with this medicine? Do not take this medicine with any of the following medications:  anakinra This medicine may also interact with the following medications:  cyclophosphamide  sulfasalazine  vaccines This list may not describe all possible interactions. Give your health care provider a list of all the medicines, herbs, non-prescription drugs, or dietary  supplements you use. Also tell them if you smoke, drink alcohol, or use illegal drugs. Some items may interact with your medicine. What should I watch for while using this medicine? Tell your doctor or healthcare professional if your symptoms do not start to get better or if they get worse. You will be tested for tuberculosis (TB) before you start this medicine. If your doctor prescribes any medicine for TB, you should start taking the TB medicine before starting this medicine. Make sure to finish the full course of TB medicine. Call your doctor or health care professional for advice if you get a fever, chills or sore throat, or other symptoms of a cold or flu. Do not treat yourself. This drug decreases your body's ability to fight infections. Try to avoid being around people who are sick. What side effects may I notice from receiving this medicine? Side effects that you should report to your doctor or health care professional as soon as possible:  allergic reactions like skin rash, itching or hives, swelling of the face, lips, or tongue  changes in vision  fever, chills or any other sign of infection  numbness or tingling in legs or other parts of the body  red, scaly patches or raised bumps on the skin  shortness of breath or difficulty breathing  swollen lymph nodes in the neck, underarm, or groin areas  unexplained weight loss  unusual bleeding or bruising  unusual swelling or fluid retention in the legs  unusually weak or tired Side effects that usually do not require medical attention (report to your doctor or health care professional if they continue or are bothersome):  dizziness  headache  nausea  redness, itching, or swelling at the injection site  vomiting This list may not describe all possible side effects. Call your doctor for medical advice about side effects. You may report side effects to FDA at 1-800-FDA-1088. Where should I keep my medicine? Keep out of  the reach of children. Enbrel products: Store unopened Enbrel vials, cartridges, or pens in a refrigerator between 2 and 8 degrees C (36 and 46 degrees F). Do not freeze. Do not shake. Keep in the original container to protect from light. Throw away any unopened and unused medicine that has been stored in the refrigerator after the expiration date. If needed, you may store an Enbrel single-use vial, single-dose prefilled syringe, SureClick autoinjector, or Enbrel Mini cartridge at room temperature between 20 to 25 degrees C (68 to 77 degrees F) for up to 14 days. Protect from light, heat, and moisture. Do not shake. Once any of these items are stored at room temperature, do not place them back into the refrigerator. Throw the item away after 14 days, even if it still contains medicine. If you are using the Enbrel multi-dose vials, you will be instructed on how to dilute and store this medicine once it has been diluted. The Enbrel AutoTouch reusable autoinjector device should be stored at room temperature. Do NOT refrigerate the AutoTouch reusable autoinjector. Erelzi products: Store Actuary pen in the refrigerator between 2 and 8 degrees C (36 and 46  degrees F). Do not freeze. Do not shake. Keep in the original container to protect from light. Throw away any unopened and unused medicine that has been stored in the refrigerator after the expiration date. If needed, you may store the [facility] prefilled syringe or pen at room temperature between 20 to 25 degrees C (68 to 77 degrees F) for up to 28 days. Protect from light, heat, and moisture. Do not shake. Once any of these items are stored at room temperature, do not place them back into the refrigerator. Throw the item away after 28 days, even if it still contains medicine. NOTE: This sheet is a summary. It may not cover all possible information. If you have questions about this medicine, talk to your doctor, pharmacist, or  health care provider.  2020 Elsevier/Gold Standard (2018-10-13 09:49:58)   COVID-19 vaccine recommendations:   COVID-19 vaccine is recommended for everyone (unless you are allergic to a vaccine component), even if you are on a medication that suppresses your immune system.   If you are on Methotrexate, Cellcept (mycophenolate), Rinvoq, Morrie Sheldon, and Olumiant- hold the medication for 1 week after each vaccine. Hold Methotrexate for 2 weeks after the single dose COVID-19 vaccine.   If you are on Orencia subcutaneous injection - hold medication one week prior to and one week after the first COVID-19 vaccine dose (only).   If you are on Orencia IV infusions- time vaccination administration so that the first COVID-19 vaccination will occur four weeks after the infusion and postpone the subsequent infusion by one week.   If you are on Cyclophosphamide or Rituxan infusions please contact your doctor prior to receiving the COVID-19 vaccine.   Do not take Tylenol or any anti-inflammatory medications (NSAIDs) 24 hours prior to the COVID-19 vaccination.   There is no direct evidence about the efficacy of the COVID-19 vaccine in individuals who are on medications that suppress the immune system.   Even if you are fully vaccinated, and you are on any medications that suppress your immune system, please continue to wear a mask, maintain at least six feet social distance and practice hand hygiene.   If you develop a COVID-19 infection, please contact your PCP or our office to determine if you need antibody infusion.  The booster vaccine is now available for immunocompromised patients. It is advised that if you had Pfizer vaccine you should get [employer]a booster.  If you had a Moderna vaccine then you should get a Moderna booster. Johnson and Wynetta Emery does not have a booster vaccine at this time.  Please see the following web sites for updated information.    [email or web address]-Patient-[email or web address]  [email or web address]

## 2020-03-29 ENCOUNTER — Encounter: Payer: Self-pay | Admitting: *Deleted

## 2020-03-30 DIAGNOSIS — M25512 Pain in left shoulder: Secondary | ICD-10-CM | POA: Diagnosis not present

## 2020-03-30 DIAGNOSIS — M797 Fibromyalgia: Secondary | ICD-10-CM | POA: Diagnosis not present

## 2020-03-30 DIAGNOSIS — M059 Rheumatoid arthritis with rheumatoid factor, unspecified: Secondary | ICD-10-CM | POA: Diagnosis not present

## 2020-04-06 DIAGNOSIS — M25512 Pain in left shoulder: Secondary | ICD-10-CM | POA: Diagnosis not present

## 2020-04-06 DIAGNOSIS — M797 Fibromyalgia: Secondary | ICD-10-CM | POA: Diagnosis not present

## 2020-04-06 DIAGNOSIS — M059 Rheumatoid arthritis with rheumatoid factor, unspecified: Secondary | ICD-10-CM | POA: Diagnosis not present

## 2020-04-11 ENCOUNTER — Ambulatory Visit: Payer: 59 | Admitting: Orthopedic Surgery

## 2020-04-11 DIAGNOSIS — M059 Rheumatoid arthritis with rheumatoid factor, unspecified: Secondary | ICD-10-CM | POA: Diagnosis not present

## 2020-04-11 DIAGNOSIS — M797 Fibromyalgia: Secondary | ICD-10-CM | POA: Diagnosis not present

## 2020-04-11 DIAGNOSIS — M25512 Pain in left shoulder: Secondary | ICD-10-CM | POA: Diagnosis not present

## 2020-04-20 DIAGNOSIS — M25512 Pain in left shoulder: Secondary | ICD-10-CM | POA: Diagnosis not present

## 2020-04-20 DIAGNOSIS — M797 Fibromyalgia: Secondary | ICD-10-CM | POA: Diagnosis not present

## 2020-04-20 DIAGNOSIS — M059 Rheumatoid arthritis with rheumatoid factor, unspecified: Secondary | ICD-10-CM | POA: Diagnosis not present

## 2020-04-21 ENCOUNTER — Other Ambulatory Visit: Payer: Self-pay | Admitting: Rheumatology

## 2020-04-22 ENCOUNTER — Ambulatory Visit: Payer: 59

## 2020-04-22 NOTE — Progress Notes (Signed)
Pharmacy Note  Subjective:   Patient presents to clinic today to receive first dose of Enbrel.  Patient running a fever or have signs/symptoms of infection? No  Patient currently on antibiotics for the treatment of infection? No  Patient have any upcoming invasive procedures/surgeries? No  Objective: CMP     Component Value Date/Time   NA 142 03/15/2020 1318   K 3.8 03/15/2020 1318   CL 104 03/15/2020 1318   CO2 24 03/15/2020 1318   GLUCOSE 117 (H) 03/15/2020 1318   GLUCOSE 87 10/23/2019 1210   BUN 11 03/15/2020 1318   CREATININE 1.07 (H) 03/15/2020 1318   CREATININE 0.82 10/23/2019 1210   CALCIUM 9.3 03/15/2020 1318   PROT 6.7 03/15/2020 1318   ALBUMIN 4.0 03/15/2020 1318   AST 25 03/15/2020 1318   ALT 29 03/15/2020 1318   ALKPHOS 83 03/15/2020 1318   BILITOT <0.2 03/15/2020 1318   GFRNONAA 63 03/15/2020 1318   GFRNONAA 87 10/23/2019 1210   GFRAA 73 03/15/2020 1318   GFRAA 101 10/23/2019 1210    CBC    Component Value Date/Time   WBC 5.5 03/15/2020 1318   WBC 5.5 10/23/2019 1210   RBC 4.44 03/15/2020 1318   RBC 4.36 10/23/2019 1210   HGB 13.6 03/15/2020 1318   HCT 39.7 03/15/2020 1318   PLT 271 03/15/2020 1318   MCV 89 03/15/2020 1318   MCH 30.6 03/15/2020 1318   MCH 29.6 10/23/2019 1210   MCHC 34.3 03/15/2020 1318   MCHC 33.3 10/23/2019 1210   RDW 11.9 03/15/2020 1318   LYMPHSABS 2.1 03/15/2020 1318   MONOABS 0.8 03/04/2018 2341   EOSABS 0.2 03/15/2020 1318   BASOSABS 0.0 03/15/2020 1318    Baseline Immunosuppressant Therapy Labs TB GOLD Quantiferon TB Gold Latest Ref Rng & Units 10/23/2019  Quantiferon TB Gold Plus NEGATIVE NEGATIVE   Hepatitis Panel Hepatitis Latest Ref Rng & Units 03/15/2020  Hep B Surface Ag Negative Negative  Hep B IgM Negative Negative  Hep A IgM Negative Negative   HIV Lab Results  Component Value Date   HIV Non Reactive 03/15/2020   Immunoglobulins Immunoglobulin Electrophoresis Latest Ref Rng & Units 03/15/2020  IgG  586 - 1,602 mg/dL 1,109  IgM 26 - 217 mg/dL 41   SPEP Serum Protein Electrophoresis Latest Ref Rng & Units 03/15/2020  Total Protein 6.0 - 8.5 g/dL 6.7  Albumin 2.9 - 4.4 g/dL 3.4   G6PD No results found for: G6PDH TPMT No results found for: TPMT   Chest x-ray: no acute cardiopulmonary process seen 03/04/2018  Assessment/Plan:  Demonstrated proper injection technique with demo pen.  Patient able to demonstrate proper injection technique using the teach back method.  Patient self injected in the left upper thigh with:  Sample Medication: Enbrel Mini 50mg /ml NDC: 17408-144-81 Lot: [id number] Expiration: 4/23  Patient tolerated well.  Observed for 30 mins in office for adverse reaction and none noted.   Patient is to return in 1 month for labs and 6-8 weeks for follow-up appointment.. Standing orders placed. Prescription sent to [facility] per patient's request.  Patient given information on how to sign up for a co-pay card.  Also, advised that she would be getting a call from [facility].  Patient verbalized understanding.  Patient given sample with same lot and expiration as above in case of shipping delays.  All questions encouraged and answered.  Instructed patient to call with any further questions or concerns.  Mariella Saa, PharmD, [facility] Rheumatology  Clinical Pharmacist  04/22/2020 12:30 PM

## 2020-04-28 ENCOUNTER — Other Ambulatory Visit: Payer: Self-pay

## 2020-04-28 ENCOUNTER — Ambulatory Visit (INDEPENDENT_AMBULATORY_CARE_PROVIDER_SITE_OTHER): Payer: 59 | Admitting: Pharmacist

## 2020-04-28 VITALS — BP 135/84 | HR 85

## 2020-04-28 DIAGNOSIS — M0579 Rheumatoid arthritis with rheumatoid factor of multiple sites without organ or systems involvement: Secondary | ICD-10-CM

## 2020-04-28 MED ORDER — ENBREL MINI 50 MG/ML ~~LOC~~ SOCT: 50.0000 mg | SUBCUTANEOUS | 0 refills | Status: DC

## 2020-04-28 NOTE — Patient Instructions (Signed)
Standing Labs °We placed an order today for your standing lab work.  ° °Please have your standing labs drawn in 1 month and then every 3 months ° °If possible, please have your labs drawn 2 weeks prior to your appointment so that the provider can discuss your results at your appointment. ° °We have open lab daily °Monday through Thursday from 8:30-12:30 PM and 1:30-4:30 PM and Friday from 8:30-12:30 PM and 1:30-4:00 PM °at the office of Dr. Shaili Deveshwar, [facility].   °Please be advised, patients with office appointments requiring lab work will take precedents over walk-in lab work.  °If possible, please come for your lab work on Monday and Friday afternoons, as you may experience shorter wait times. °The office is located at [address], [address], [place], [place] [address] °No appointment is necessary.   °Labs are drawn by [facility]. Please bring your co-pay at the time of your lab draw.  You may receive a bill from [facility] for your lab work. ° °If you wish to have your labs drawn at another location, please call the office 24 hours in advance to send orders. ° °If you have any questions regarding directions or hours of operation,  °please call [phone].   °As a reminder, please drink plenty of water prior to coming for your lab work. Thanks! ° °

## 2020-05-09 ENCOUNTER — Telehealth: Payer: Self-pay | Admitting: Rheumatology

## 2020-05-09 NOTE — Telephone Encounter (Signed)
Patient calling in reference to being sent to another practice to work. Patient wants to know if she would be safe to go to the COVID vaccine site instead of other offices? Patient concerned about exposure, but needs hours. Please call to advise.

## 2020-05-10 NOTE — Telephone Encounter (Signed)
Patient should be fully vaccinated.  She should also receive booster if she has not received yet.  The recommendations will be to use all the precautions and she can work at [place] site if there is no other option.

## 2020-05-10 NOTE — Telephone Encounter (Signed)
Patient states she is fully vaccinated. Advised patient She should also receive booster if she has not received yet.  The recommendations will be to use all the precautions and she can work at [place] site if there is no other option. Patient verbalized understanding.

## 2020-05-11 ENCOUNTER — Telehealth: Payer: Self-pay

## 2020-05-11 MED FILL — ENBREL MINI 50 MG/ML SOCT: 50 | 28 days supply | Qty: 4 | Fill #0

## 2020-05-11 NOTE — Telephone Encounter (Signed)
Attempted to contact the patient and left message to advise patient she may come by the office to pick up a sample of Enbrel Mini. Sample reserved in the fridge for patient.

## 2020-05-11 NOTE — Telephone Encounter (Signed)
Patient called stating she is due to take her Enbrel injection tomorrow, but hasn't received her co-pay card yet.  Patient states she checked the website and the status states it is shipping.  Patient requested a return call to see if she can stop by the office for a sample.

## 2020-05-15 IMAGING — DX DG CHEST 2V
2 series · 2 of 2 positions shown · non-contrast
Comparison: None.

CLINICAL DATA: Acute onset of lower bilateral chest pain and sore
throat. Mid chest pain.

EXAM:
CHEST - 2 VIEW

[chest pa]
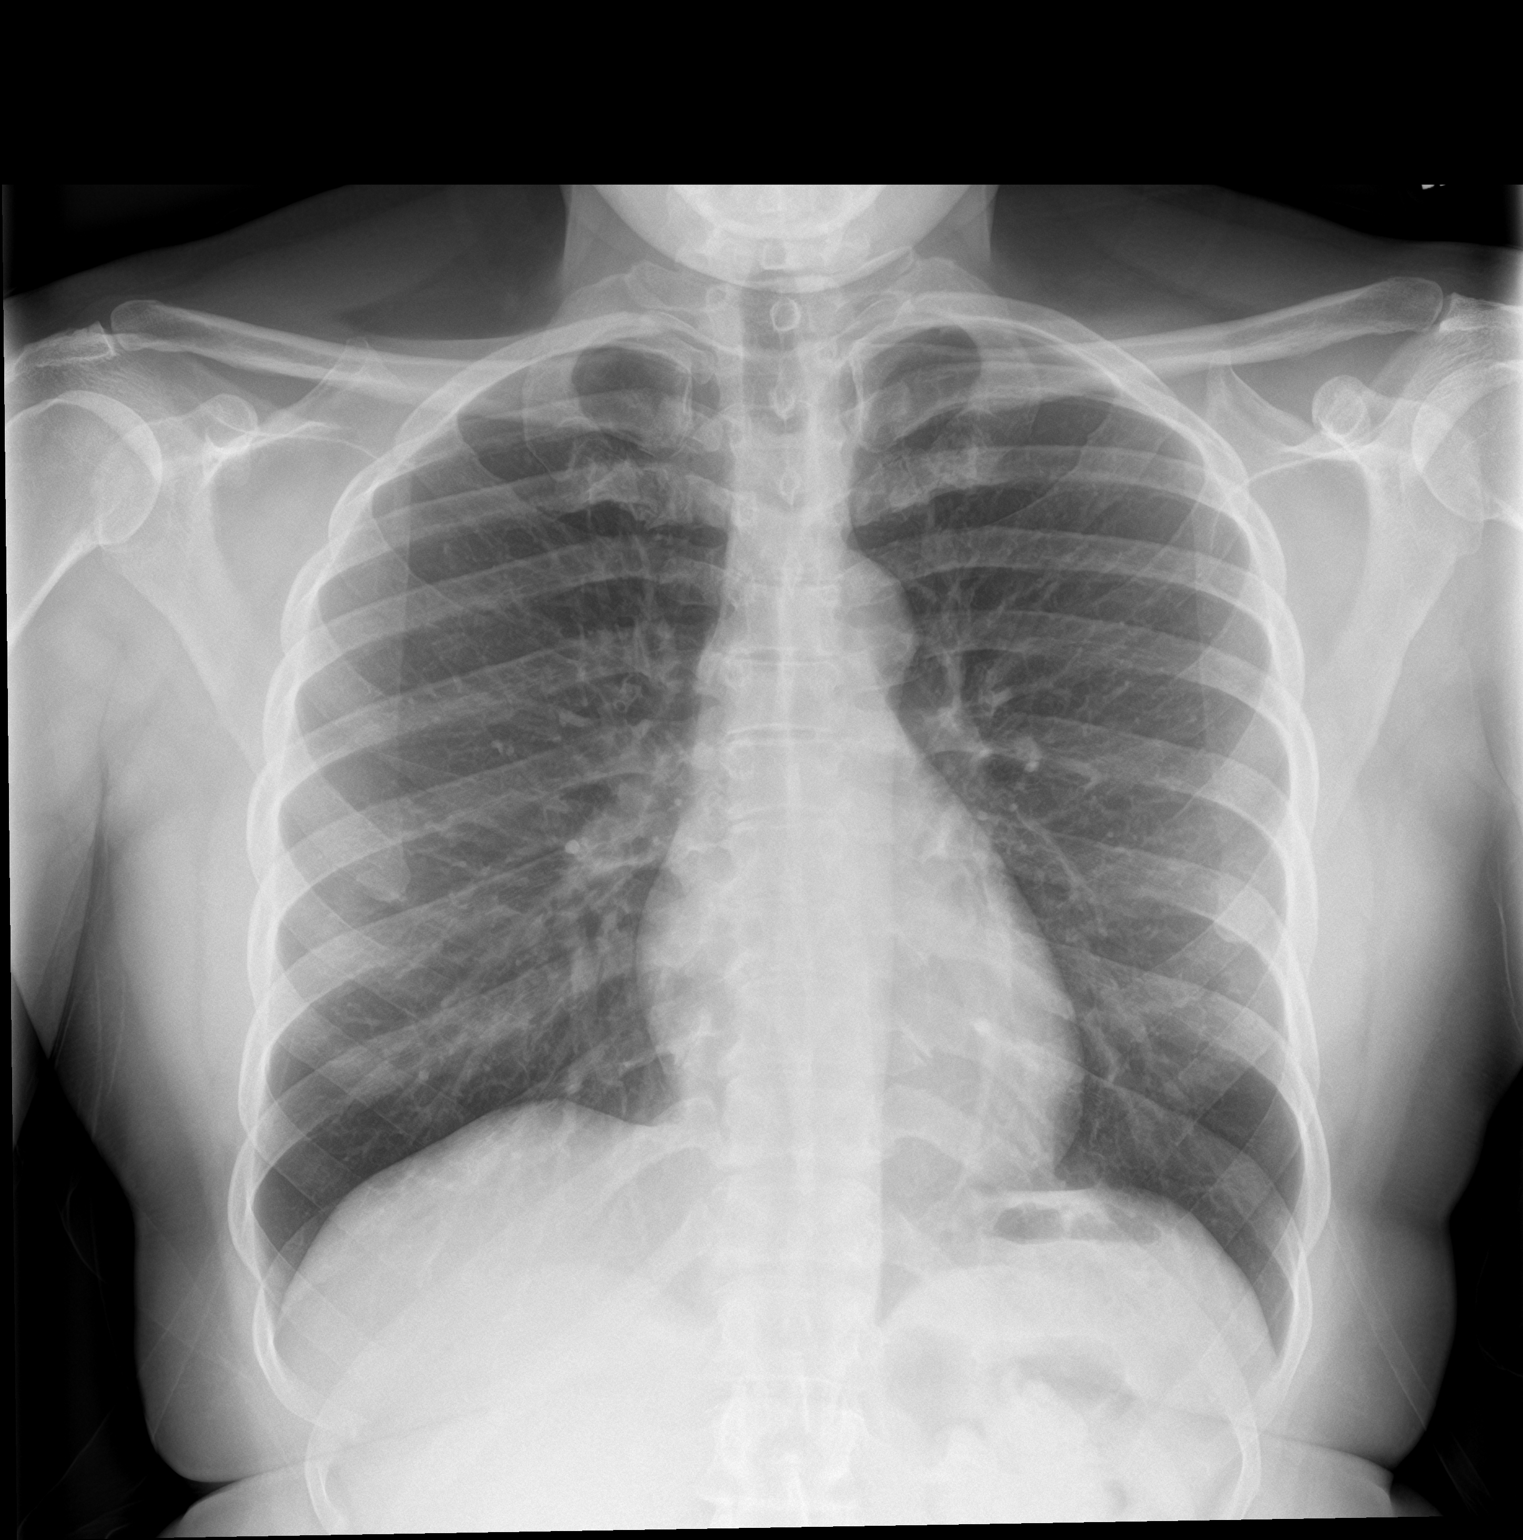

[chest lat]
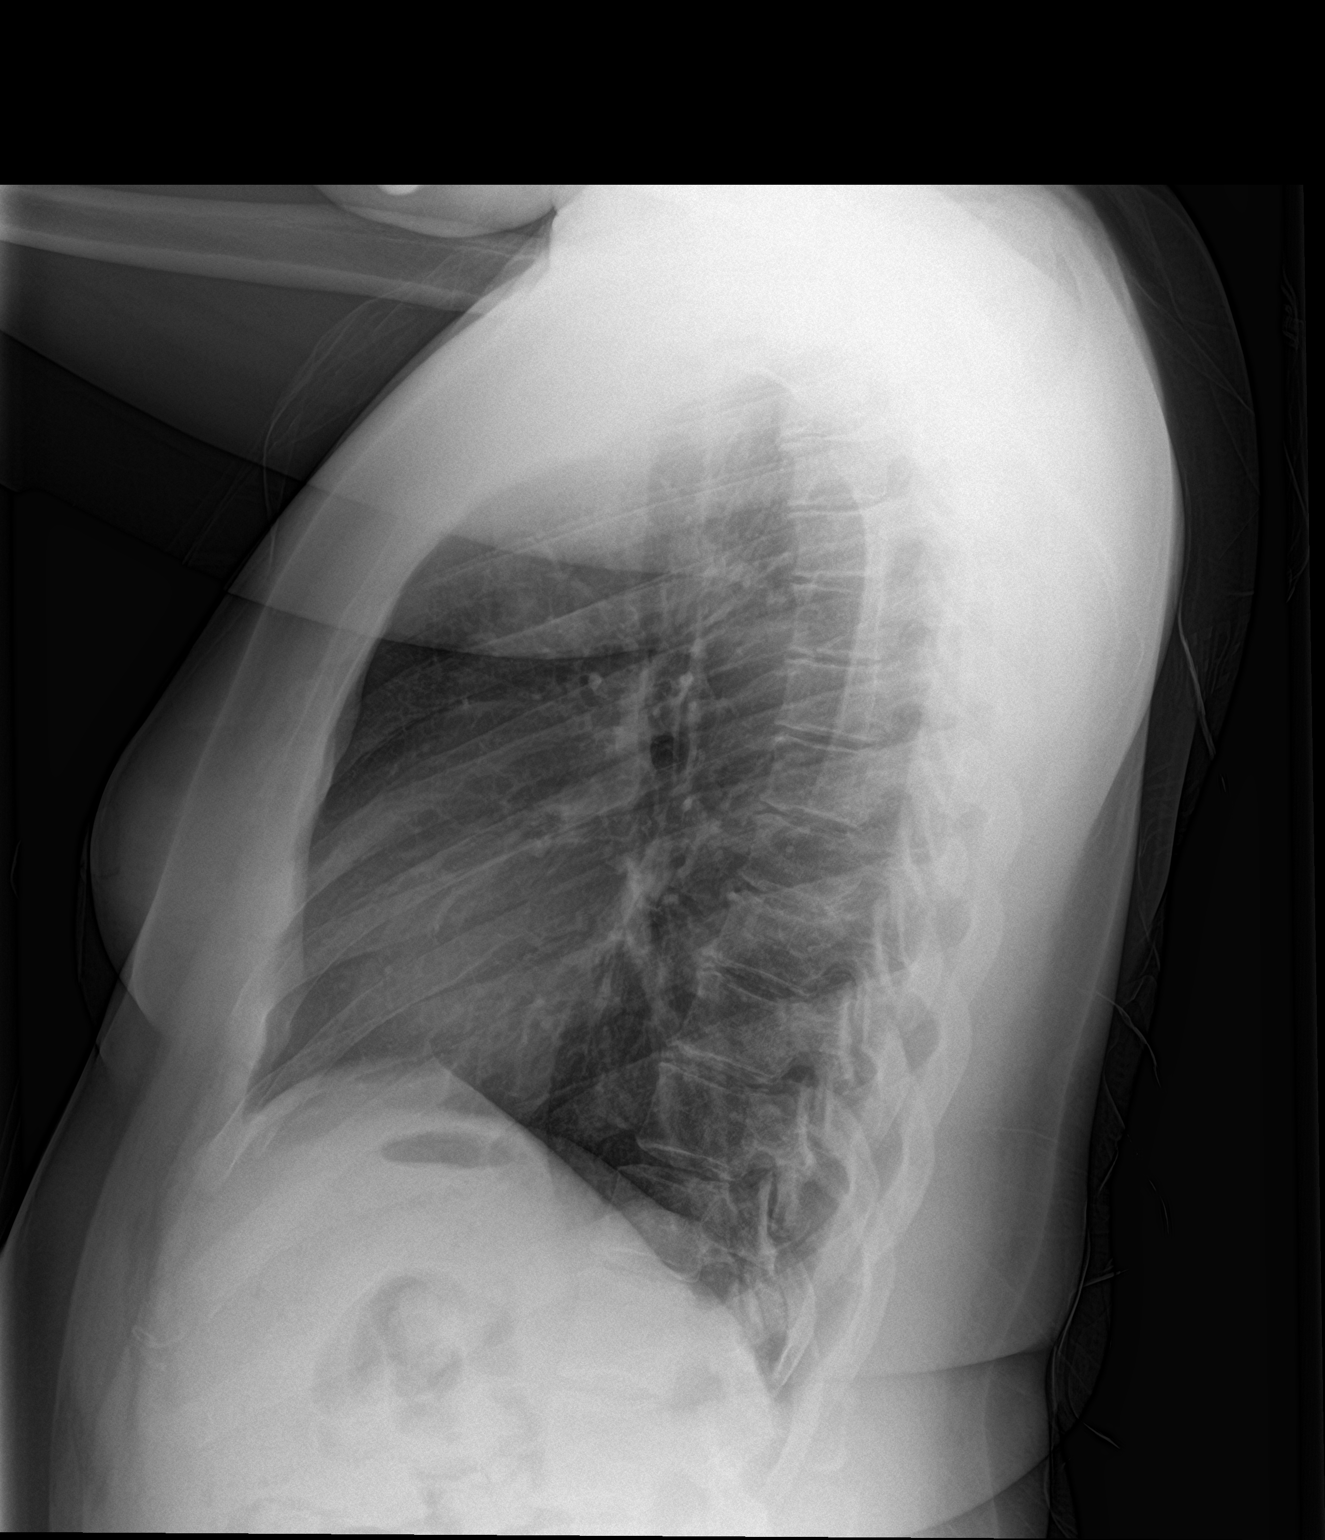

[2 of 2 positions shown; findings below may reference images not displayed]

FINDINGS: The lungs are well-aerated and clear. There is no evidence of focal
opacification, pleural effusion or pneumothorax.

The heart is normal in size; the mediastinal contour is within
normal limits. No acute osseous abnormalities are seen.
IMPRESSION: No acute cardiopulmonary process seen.

## 2020-05-20 MED FILL — ENBREL MINI 50 MG/ML SOCT: 50 | 28 days supply | Qty: 4 | Fill #0

## 2020-05-26 ENCOUNTER — Other Ambulatory Visit: Payer: Self-pay

## 2020-05-26 ENCOUNTER — Other Ambulatory Visit: Payer: Self-pay | Admitting: Internal Medicine

## 2020-05-26 ENCOUNTER — Ambulatory Visit (HOSPITAL_BASED_OUTPATIENT_CLINIC_OR_DEPARTMENT_OTHER): Payer: 59 | Admitting: Pharmacist

## 2020-05-26 DIAGNOSIS — M0579 Rheumatoid arthritis with rheumatoid factor of multiple sites without organ or systems involvement: Secondary | ICD-10-CM

## 2020-05-26 MED ORDER — ENBREL MINI 50 MG/ML ~~LOC~~ SOCT: 50.0000 mg | SUBCUTANEOUS | 0 refills | Status: DC

## 2020-05-26 MED FILL — ENBREL MINI 50 MG/ML SOCT: 50 | 28 days supply | Qty: 4 | Fill #0

## 2020-05-26 NOTE — Progress Notes (Signed)
   S: Patient presents for review of their specialty medication therapy.  Patient is currently taking Enbrel for RA. Patient is managed by Dr. Estanislado Pandy for this.   Adherence: just started (switched from Humira) ~3 weeks ago.   Efficacy: has not noticed much of a difference since starting the medication  Dosing: 50 mg once weekly   Ankylosing spondylitis, psoriatic arthritis, rheumatoid arthritis: SubQ: Note: May continue methotrexate, glucocorticoids, salicylates, NSAIDs, or analgesics during etanercept therapy. Once-weekly dosing: 50 mg once weekly  Screening: TB test: completed per pt  Hepatitis B: completed per pt   Monitoring: Injection site reactions: none  S/sx of infections: none  S/sx of malignancy: none  GI upset: none   O: Lab Results  Component Value Date   WBC 5.5 03/15/2020   HGB 13.6 03/15/2020   HCT 39.7 03/15/2020   MCV 89 03/15/2020   PLT 271 03/15/2020      Chemistry      Component Value Date/Time   NA 142 03/15/2020 1318   K 3.8 03/15/2020 1318   CL 104 03/15/2020 1318   CO2 24 03/15/2020 1318   BUN 11 03/15/2020 1318   CREATININE 1.07 (H) 03/15/2020 1318   CREATININE 0.82 10/23/2019 1210      Component Value Date/Time   CALCIUM 9.3 03/15/2020 1318   ALKPHOS 83 03/15/2020 1318   AST 25 03/15/2020 1318   ALT 29 03/15/2020 1318   BILITOT <0.2 03/15/2020 1318       A/P: 1. Medication review: patient currently on Enbrel for RA and is tolerating it well. Reviewed the medication with the patient, including the following: Enbrel (etanercept) binds tumor necrosis factor (TNF) and blocks its interaction with cell surface receptors. TNF plays an important role in the inflammatory processes of many diseases. Patient educated on purpose, proper use and potential adverse effects of Enbrel. SubQ: Administer subcutaneously. Rotate injection sites; may inject into the thigh (preferred), abdomen (avoiding the 2-inch area around the navel), or outer areas of  upper arm. New injections should be given at least 1 inch from an old site and never into areas where the skin is tender, bruised, red, or hard; any raised thick, red, or scaly skin patches or lesions; or areas with scars or stretch marks. For a more comfortable injection, allow autoinjectors, prefilled syringes, and dose trays to reach room temperature for 15 to 30 minutes (?30 minutes for autoinjector) prior to injection; do not remove the needle cover while allowing product to reach room temperature. There may be small white particles of protein in the solution; this is not unusual for proteinaceous solutions. Possible adverse effects include rash, GI upset, increased risk of infection, and injection site reactions. Patients should stop Enbrel if they develop a serious infection. There is a possible increased risk in lymphoma and other malignancies. No recommendations for any changes.   Benard Halsted, PharmD, [place] [phone]

## 2020-06-07 ENCOUNTER — Telehealth: Payer: Self-pay | Admitting: Rheumatology

## 2020-06-07 DIAGNOSIS — Z79899 Other long term (current) drug therapy: Secondary | ICD-10-CM

## 2020-06-07 NOTE — Telephone Encounter (Signed)
Patient request labs be released to [facility] so she can have them drawn today.

## 2020-06-07 NOTE — Telephone Encounter (Signed)
Lab Orders released.  

## 2020-06-10 DIAGNOSIS — Z79899 Other long term (current) drug therapy: Secondary | ICD-10-CM | POA: Diagnosis not present

## 2020-06-11 LAB — CBC WITH DIFFERENTIAL/PLATELET
Basophils Absolute: 0 10*3/uL (ref 0.0–0.2)
Basos: 1 %
EOS (ABSOLUTE): 0.1 10*3/uL (ref 0.0–0.4)
Eos: 2 %
Hematocrit: 35.8 % (ref 34.0–46.6)
Hemoglobin: 12.2 g/dL (ref 11.1–15.9)
Immature Grans (Abs): 0 10*3/uL (ref 0.0–0.1)
Immature Granulocytes: 0 %
Lymphocytes Absolute: 2 10*3/uL (ref 0.7–3.1)
Lymphs: 32 %
MCH: 30 pg (ref 26.6–33.0)
MCHC: 34.1 g/dL (ref 31.5–35.7)
MCV: 88 fL (ref 79–97)
Monocytes Absolute: 0.7 10*3/uL (ref 0.1–0.9)
Monocytes: 11 %
Neutrophils Absolute: 3.4 10*3/uL (ref 1.4–7.0)
Neutrophils: 54 %
Platelets: 246 10*3/uL (ref 150–450)
RBC: 4.06 x10E6/uL (ref 3.77–5.28)
RDW: 13.1 % (ref 11.7–15.4)
WBC: 6.3 10*3/uL (ref 3.4–10.8)

## 2020-06-11 LAB — CMP14+EGFR
ALT: 54 IU/L — ABNORMAL HIGH (ref 0–32)
AST: 47 IU/L — ABNORMAL HIGH (ref 0–40)
Albumin/Globulin Ratio: 1.3 (ref 1.2–2.2)
Albumin: 3.9 g/dL (ref 3.8–4.8)
Alkaline Phosphatase: 78 IU/L (ref 44–121)
BUN/Creatinine Ratio: 10 (ref 9–23)
BUN: 9 mg/dL (ref 6–24)
Bilirubin Total: <0.2 mg/dL (ref 0.0–1.2)
CO2: 26 mmol/L (ref 20–29)
Calcium: 9.3 mg/dL (ref 8.7–10.2)
Chloride: 103 mmol/L (ref 96–106)
Creatinine, Ser: 0.88 mg/dL (ref 0.57–1.00)
GFR calc Af Amer: 92 mL/min/{1.73_m2} (ref >59)
GFR calc non Af Amer: 80 mL/min/{1.73_m2} (ref >59)
Globulin, Total: 3 g/dL (ref 1.5–4.5)
Glucose: 110 mg/dL — ABNORMAL HIGH (ref 65–99)
Potassium: 3.5 mmol/L (ref 3.5–5.2)
Sodium: 141 mmol/L (ref 134–144)
Total Protein: 6.9 g/dL (ref 6.0–8.5)

## 2020-06-12 NOTE — Telephone Encounter (Signed)
CBC is normal.  LFTs were elevated.  Please advise patient to avoid Tylenol, NSAIDs and alcohol use.

## 2020-06-14 NOTE — Progress Notes (Signed)
Office Visit Note  Patient: Karen Burton             Date of Birth: September 28, 1975           MRN: [id number]             PCP: Mikey Kirschner, MD (Inactive) Referring: No ref. provider found Visit Date: 06/27/2020 Occupation: @GUAROCC @  Subjective:  Generalized pain   History of Present Illness: Magali Bray is a 44 y.o. female with history of seropositive rheumatoid arthritis and fibromyalgia.  She is on enbrel 50 mg sq injections every week.  She has started to experience diffuse itching after her weekly Enbrel injections.  She has not noticed a rash on her skin.  She had similar side effects while on Simponi.  She presents today with generalized pain in her muscles and joints.  She is having pain in both shoulders, wrist joints, left hip joint, knees, and both feet.  She has been taking Tylenol and ibuprofen as needed for pain relief, but she has started to try to cut back on dosing due to recent elevation in LFTs.   She benefited from PT but discontinued due to cost of frequent appointments.  Patient reports she feels exhausted of always being in pain and it has started to greatly affect her mood and sleep.  She joined a support group on facebook but would like to find a group that meets in person.       Activities of Daily Living:  Patient reports morning stiffness for 3-4 hours.   Patient Reports nocturnal pain.  Difficulty dressing/grooming: Denies Difficulty climbing stairs: Reports Difficulty getting out of chair: Reports Difficulty using hands for taps, buttons, cutlery, and/or writing: Reports  Review of Systems  Constitutional: Positive for fatigue.  HENT: Positive for mouth dryness and nose dryness. Negative for mouth sores.   Eyes: Positive for dryness. Negative for pain and itching.  Respiratory: Negative for cough, hemoptysis, shortness of breath and difficulty breathing.   Cardiovascular: Negative for chest pain, palpitations, hypertension and  swelling in legs/feet.  Gastrointestinal: Positive for constipation. Negative for blood in stool and diarrhea.  Endocrine: Negative for increased urination.  Genitourinary: Negative for difficulty urinating and painful urination.  Musculoskeletal: Positive for arthralgias, joint pain, joint swelling, myalgias, morning stiffness, muscle tenderness and myalgias. Negative for muscle weakness.  Skin: Negative for color change, pallor, rash, hair loss, nodules/bumps, redness, skin tightness, ulcers and sensitivity to sunlight.  Allergic/Immunologic: Negative for susceptible to infections.  Neurological: Positive for headaches. Negative for dizziness and memory loss.  Hematological: Positive for bruising/bleeding tendency. Negative for swollen glands.  Psychiatric/Behavioral: Negative for depressed mood, confusion and sleep disturbance. The patient is not nervous/anxious.     PMFS History:  Patient Active Problem List   Diagnosis Date Noted  . Encounter for colorectal cancer screening 11/19/2019  . Weight gain 11/19/2019  . Sleep disturbance 11/19/2019  . Visit for gynecologic examination 11/19/2019  . Screening for colorectal cancer 02/13/2019  . Encounter for gynecological examination with Papanicolaou smear of cervix 02/13/2019  . Fibroids 02/13/2019  . Encounter for surveillance of contraceptive pills 08/12/2017  . Encounter for well woman exam with routine gynecological exam 08/12/2017  . ANA positive 11/06/2016  . Fibromyalgia 11/06/2016  . Menorrhagia with regular cycle 12/20/2015  . Dysmenorrhea 12/20/2015  . Uterine leiomyoma 12/20/2015  . Vulvar itching 12/20/2015  . Anemia 12/20/2015  . Essential hypertension 01/12/2013  . Esophageal reflux 01/12/2013  . Rheumatoid arthritis ([place]) 01/12/2013  .  Rash and nonspecific skin eruption 01/12/2013    Past Medical History:  Diagnosis Date  . Anemia   . Arthritis   . Dysmenorrhea 12/20/2015  . Eczema    mild  . Fibroids 12/20/2015   . Fibromyalgia   . Menorrhagia with regular cycle 12/20/2015  . Rash   . Reflux   . Rhinitis    chonic  . Vitamin D deficiency   . Vulvar itching 12/20/2015    Family History  Problem Relation Age of Onset  . Diabetes Mother   . Hypertension Mother   . Asthma Mother   . Cancer Mother        breast  . Thyroid disease Mother   . Arthritis Mother   . Graves' disease Mother   . Cancer Maternal Grandmother        pancreatic  . Fibroids Sister   . COPD Maternal Aunt   . Asthma Maternal Aunt   . Gout Maternal Aunt    Past Surgical History:  Procedure Laterality Date  . ESOPHAGOGASTRODUODENOSCOPY ENDOSCOPY    . IR GENERIC HISTORICAL  02/07/2016   IR RADIOLOGIST EVAL & MGMT 02/07/2016 [name], MD [facility] INTERV RAD  . WISDOM TOOTH EXTRACTION     Social History   Social History Narrative  . Not on file   Immunization History  Administered Date(s) Administered  . DT (Pediatric) 02/20/2013  . Influenza-Unspecified 07/18/2009, 05/16/2016, 06/03/2017, 04/07/2018  . PFIZER SARS-COV-2 Vaccination 04/01/2020, 04/22/2020  . Pneumococcal Polysaccharide-23 05/08/2007     Objective: Vital Signs: BP 130/89 (BP Location: Left Arm, Patient Position: Sitting, Cuff Size: Large)   Pulse 85   Resp 16   Ht 5\' 5"  (1.651 m)   Wt 209 lb 9.6 oz (95.1 kg)   BMI 34.88 kg/m    Physical Exam Vitals and nursing note reviewed.  Constitutional:      Appearance: She is well-developed.  HENT:     Head: Normocephalic and atraumatic.  Eyes:     Conjunctiva/sclera: Conjunctivae normal.  Pulmonary:     Effort: Pulmonary effort is normal.  Abdominal:     Palpations: Abdomen is soft.  Musculoskeletal:     Cervical back: Normal range of motion.  Skin:    General: Skin is warm and dry.     Capillary Refill: Capillary refill takes less than 2 seconds.  Neurological:     Mental Status: She is alert and oriented to person, place, and time.  Psychiatric:        Behavior: Behavior normal.       Musculoskeletal Exam: Generalized hyperalgesia and positive tender points.  C-spine, thoracic spine, and lumbar spine good ROM.  Trapezius muscle tension and tenderness bilaterally.  Left shoulder limited abduction, internal rotation and forward flexion with discomfort.  Right knee has good ROM with some discomfort.  Elbow joints, wrist joints, MCPs, PIPs, and DIPs good ROM with no synovitis.  Complete fist formation bilaterally. Left hip slightly limited ROM with discomfort.  Right hip has good ROM with no discomfort.  Knee joints and ankle joints good ROM with no discomfort.  No warmth or effusion of knee joints.  No tenderness or swelling of ankle joints.   CDAI Exam: CDAI Score: 1.4  Patient Global: 7 mm; Provider Global: 7 mm Swollen: 0 ; Tender: 0  Joint Exam 06/27/2020   No joint exam has been documented for this visit   There is currently no information documented on the homunculus. Go to the Rheumatology activity and complete the homunculus  joint exam.  Investigation: No additional findings.  Imaging: No results found.  Recent Labs: Lab Results  Component Value Date   WBC 6.3 06/10/2020   HGB 12.2 06/10/2020   PLT 246 06/10/2020   NA 141 06/10/2020   K 3.5 06/10/2020   CL 103 06/10/2020   CO2 26 06/10/2020   GLUCOSE 110 (H) 06/10/2020   BUN 9 06/10/2020   CREATININE 0.88 06/10/2020   BILITOT <0.2 06/10/2020   ALKPHOS 78 06/10/2020   AST 47 (H) 06/10/2020   ALT 54 (H) 06/10/2020   PROT 6.9 06/10/2020   ALBUMIN 3.9 06/10/2020   CALCIUM 9.3 06/10/2020   GFRAA 92 06/10/2020   QFTBGOLDPLUS NEGATIVE 10/23/2019    Speciality Comments: Prior therapy includes: methotrexate and Plaquenil (neutropenia) and Humira (painful injections)  Procedures:  No procedures performed Allergies: Ceftin [cefuroxime axetil], Humira [adalimumab], and Methotrexate derivatives   Assessment / Plan:     Visit Diagnoses: Rheumatoid arthritis involving multiple sites with positive  rheumatoid factor ([place]): No synovitis was noted on examination today. She has been experiencing increased pain in multiple joints including both shoulders, both wrists, both hands, both knees, and both feet. She had no joint tenderness palpation on exam. She has noticed stiffness in both hands and both knee joints. She states that it feels as though her knee joints are on fire currently. She is on Enbrel 50 mg sq injections once weekly and has not missed any doses. She has been taking Tylenol and ibuprofen as needed for pain relief. We discussed scheduling an ultrasound of both hands to assess for synovitis prior to making any medication changes at this time. I discussed that a lot of her discomfort is likely coming from fibromyalgia since she has generalized hyperalgesia and positive tender points on examination today. She will continue on Enbrel as prescribed. She will be scheduled for an ultrasound of both hands and will be seen for routine follow-up in 3 months.  High risk medication use - Enbrel 50 mg sq injection once weekly. CBC and CMP were updated on 06/10/2020.  Her next lab work will be due in February and every 3 months to monitor for toxicity.  Standing orders for CBC and CMP are in place.  TB gold negative on 10/23/2019 and will continue to be monitored yearly. She has received the COVID-19 vaccinations.  We stressed the importance of holding Enbrel if she develop signs or symptoms of an infection and to resume once infection has completely cleared. - Plan: COMPLETE METABOLIC PANEL WITH GFR  Work note was provided to the patient today.   Fibromyalgia: Generalized hyperalgesia and positive tender points on exam. She is currently having generalized myalgias and muscle tenderness. She experiences intermittent muscle spasms. She recently went to physical therapy which helped with the trapezius and bilateral shoulder joint pain but she had to discontinue due to cost. She continues to experience  significant fatigue secondary to insomnia. She has been experiencing nocturnal pain on a nightly basis. We discussed the importance of regular exercise and good sleep hygiene. We discussed in the future we can refer her to pain management for back to physical therapy if she would like to.  Other fatigue: Chronic and secondary to insomnia. Discussed the importance of regular exercise and good sleep hygiene.   History of insomnia: She takes melatonin 5 mg at bedtime and trazodone 25 mg at bedtime as needed for insomnia.  She occasionally takes ambien 5 mg at bedtime for insomnia.  Elevated CK: CK was  144 on 03/15/20.   Paresthesia of both hands: She experiences intermittent paresthesias in both hands.  Other medical conditions are listed as follows:   History of gastroesophageal reflux (GERD)  History of hypertension  Orders: Orders Placed This Encounter  Procedures  . COMPLETE METABOLIC PANEL WITH GFR  . Sedimentation rate  . AST  . ALT   No orders of the defined types were placed in this encounter.    Follow-Up Instructions: Return in about 3 months (around 09/26/2020) for Rheumatoid arthritis, Fibromyalgia.   Ofilia Neas, PA-C  Note - This record has been created using Dragon software.  Chart creation errors have been sought, but may not always  have been located. Such creation errors do not reflect on  the standard of medical care.

## 2020-06-16 ENCOUNTER — Other Ambulatory Visit: Payer: Self-pay

## 2020-06-16 DIAGNOSIS — M0579 Rheumatoid arthritis with rheumatoid factor of multiple sites without organ or systems involvement: Secondary | ICD-10-CM

## 2020-06-17 MED FILL — ENBREL MINI 50 MG/ML SOCT: 50 | 28 days supply | Qty: 4 | Fill #1

## 2020-06-22 ENCOUNTER — Other Ambulatory Visit: Payer: Self-pay | Admitting: Rheumatology

## 2020-06-22 NOTE — Telephone Encounter (Signed)
Last Visit: 03/28/2020 Next Visit: 06/27/2020  Okay to refill per Dr. Estanislado Pandy

## 2020-06-27 ENCOUNTER — Ambulatory Visit: Payer: 59 | Admitting: Physician Assistant

## 2020-06-27 ENCOUNTER — Other Ambulatory Visit: Payer: Self-pay

## 2020-06-27 ENCOUNTER — Encounter: Payer: Self-pay | Admitting: Physician Assistant

## 2020-06-27 VITALS — BP 130/89 | HR 85 | Resp 16 | Ht 65.0 in | Wt 209.6 lb

## 2020-06-27 DIAGNOSIS — R202 Paresthesia of skin: Secondary | ICD-10-CM | POA: Diagnosis not present

## 2020-06-27 DIAGNOSIS — R748 Abnormal levels of other serum enzymes: Secondary | ICD-10-CM | POA: Diagnosis not present

## 2020-06-27 DIAGNOSIS — Z8679 Personal history of other diseases of the circulatory system: Secondary | ICD-10-CM | POA: Diagnosis not present

## 2020-06-27 DIAGNOSIS — Z79899 Other long term (current) drug therapy: Secondary | ICD-10-CM | POA: Diagnosis not present

## 2020-06-27 DIAGNOSIS — M0579 Rheumatoid arthritis with rheumatoid factor of multiple sites without organ or systems involvement: Secondary | ICD-10-CM

## 2020-06-27 DIAGNOSIS — R7989 Other specified abnormal findings of blood chemistry: Secondary | ICD-10-CM

## 2020-06-27 DIAGNOSIS — Z8719 Personal history of other diseases of the digestive system: Secondary | ICD-10-CM

## 2020-06-27 DIAGNOSIS — M797 Fibromyalgia: Secondary | ICD-10-CM | POA: Diagnosis not present

## 2020-06-27 DIAGNOSIS — Z87898 Personal history of other specified conditions: Secondary | ICD-10-CM

## 2020-06-27 DIAGNOSIS — R5383 Other fatigue: Secondary | ICD-10-CM | POA: Diagnosis not present

## 2020-06-28 ENCOUNTER — Telehealth: Payer: Self-pay | Admitting: Rheumatology

## 2020-06-28 NOTE — Telephone Encounter (Signed)
I spoke with Dr. Estanislado Pandy. She cannot take tylenol or NSAIDs due to elevated LFTs and recent rise in creatinine.  She is not a good candidate for prednisone at this time leading up to the ultrasound of her hands. Please offer pain management referral.

## 2020-06-28 NOTE — Telephone Encounter (Signed)
Patient was advised to limit pain medication, but still having pain. Patient would like to know what you would suggest to help. If another rx, patient uses [facility]. Patient does not want to take anything that could cause her to be sleepy. Please call to advise.

## 2020-06-29 ENCOUNTER — Telehealth: Payer: Self-pay | Admitting: *Deleted

## 2020-06-29 NOTE — Telephone Encounter (Signed)
Received results from [facility] on 06/10/2020 Reviewed by Hazel Sams, PA-C  Tb Gold: Negative.

## 2020-06-29 NOTE — Telephone Encounter (Signed)
Patient advised per Dr. Estanislado Pandy and Hazel Sams, PA-C. She cannot take tylenol or NSAIDs due to elevated LFTs and recent rise in creatinine.  She is not a good candidate for prednisone at this time leading up to the ultrasound of her hands. Offered pain management referral. Patient declined at this time stating she will be in our office on 07/13/2020.

## 2020-06-29 NOTE — Telephone Encounter (Signed)
Attempted to contact the patient and left message for patient to call the office.  

## 2020-07-07 ENCOUNTER — Encounter: Payer: Self-pay | Admitting: Family Medicine

## 2020-07-08 ENCOUNTER — Other Ambulatory Visit: Payer: Self-pay

## 2020-07-08 DIAGNOSIS — M0579 Rheumatoid arthritis with rheumatoid factor of multiple sites without organ or systems involvement: Secondary | ICD-10-CM

## 2020-07-08 NOTE — Telephone Encounter (Signed)
Please call and make appt and then route back so we can send in refill. thanks

## 2020-07-08 NOTE — Telephone Encounter (Signed)
Pt made appt for 08/08/2020 for Med Check she said she can not come in no more in December

## 2020-07-12 NOTE — Telephone Encounter (Signed)
Please fill if appropriate.  

## 2020-07-13 ENCOUNTER — Other Ambulatory Visit: Payer: Self-pay

## 2020-07-13 ENCOUNTER — Ambulatory Visit: Payer: Self-pay

## 2020-07-13 ENCOUNTER — Other Ambulatory Visit: Payer: Self-pay | Admitting: *Deleted

## 2020-07-13 ENCOUNTER — Ambulatory Visit (INDEPENDENT_AMBULATORY_CARE_PROVIDER_SITE_OTHER): Payer: 59 | Admitting: Rheumatology

## 2020-07-13 DIAGNOSIS — R7989 Other specified abnormal findings of blood chemistry: Secondary | ICD-10-CM | POA: Diagnosis not present

## 2020-07-13 DIAGNOSIS — R202 Paresthesia of skin: Secondary | ICD-10-CM | POA: Diagnosis not present

## 2020-07-13 DIAGNOSIS — M0579 Rheumatoid arthritis with rheumatoid factor of multiple sites without organ or systems involvement: Secondary | ICD-10-CM

## 2020-07-13 MED ORDER — LOSARTAN POTASSIUM 50 MG PO TABS
50.0000 mg | ORAL_TABLET | Freq: Every day | ORAL | 0 refills | Status: DC
Start: 2020-07-13 — End: 2020-07-13

## 2020-07-13 MED ORDER — AMLODIPINE BESYLATE 5 MG PO TABS
5.0000 mg | ORAL_TABLET | Freq: Every day | ORAL | 0 refills | Status: DC
Start: 2020-07-13 — End: 2020-07-13

## 2020-07-13 MED ORDER — AMLODIPINE BESYLATE 5 MG PO TABS
5.0000 mg | ORAL_TABLET | Freq: Every day | ORAL | 0 refills | Status: DC
Start: 2020-07-13 — End: 2020-08-08

## 2020-07-13 MED ORDER — LOSARTAN POTASSIUM 50 MG PO TABS
50.0000 mg | ORAL_TABLET | Freq: Every day | ORAL | 0 refills | Status: DC
Start: 2020-07-13 — End: 2020-08-08

## 2020-07-13 MED FILL — ENBREL MINI 50 MG/ML SOCT: 50 | 28 days supply | Qty: 4 | Fill #2

## 2020-07-14 LAB — ALT: ALT: 30 U/L — ABNORMAL HIGH (ref 6–29)

## 2020-07-14 LAB — AST: AST: 31 U/L — ABNORMAL HIGH (ref 10–30)

## 2020-07-14 NOTE — Progress Notes (Signed)
AST and ALT are borderline elevated but have trended down.  Please advise the patient to continue to avoid tylenol, NSAID use, and alcohol use.

## 2020-07-14 NOTE — Progress Notes (Signed)
She can use topical agents and take NSAIDs very sparingly. We will continue to monitor lab work closely.

## 2020-08-05 ENCOUNTER — Other Ambulatory Visit: Payer: 59

## 2020-08-08 ENCOUNTER — Encounter: Payer: Self-pay | Admitting: Family Medicine

## 2020-08-08 ENCOUNTER — Other Ambulatory Visit: Payer: Self-pay | Admitting: Family Medicine

## 2020-08-08 ENCOUNTER — Ambulatory Visit (INDEPENDENT_AMBULATORY_CARE_PROVIDER_SITE_OTHER): Payer: 59 | Admitting: Family Medicine

## 2020-08-08 ENCOUNTER — Other Ambulatory Visit: Payer: Self-pay

## 2020-08-08 VITALS — BP 122/64 | HR 93 | Temp 97.6°F | Ht 65.0 in | Wt 208.8 lb

## 2020-08-08 DIAGNOSIS — I10 Essential (primary) hypertension: Secondary | ICD-10-CM | POA: Diagnosis not present

## 2020-08-08 MED ORDER — AMLODIPINE BESYLATE 5 MG PO TABS: 5.0000 mg | ORAL_TABLET | Freq: Every day | ORAL | 1 refills | Status: DC

## 2020-08-08 MED ORDER — HYDROCHLOROTHIAZIDE 12.5 MG PO TABS: 12.5000 mg | ORAL_TABLET | Freq: Every day | ORAL | 1 refills | Status: DC

## 2020-08-08 MED ORDER — LOSARTAN POTASSIUM 50 MG PO TABS: 50.0000 mg | ORAL_TABLET | Freq: Every day | ORAL | 1 refills | Status: DC

## 2020-08-08 NOTE — Progress Notes (Signed)
Patient ID: Karen Burton, female    DOB: 01/06/1976, 45 y.o.   MRN: [id number]   Chief Complaint  Patient presents with  . Hypertension   Subjective:    HPI  Pt here for med check. Blood pressure up and down. No s/sx of HTN. Pt taking meds as directed.   No recent illness.  No h/o covid illness. covid vaccine and flu vaccines up to date.  Working at ob/gyn.  Seeing rheumatologist and evaluted labs and elevated LFTs and improved.  Taking more tylenol and ibuprofen and it's improving. Has h/o RA and was having left shoulder pain. Pt on symphony, then went to [facility] and not good on it, then went to enbrel.  Has been helping with the joint pains.   Has had elevated hr in past. Had has stress test in past, normal per pt.  Seen by cardiology in past.  93 bpm.   Medical History Karen Burton has a past medical history of Anemia, Arthritis, Dysmenorrhea (12/20/2015), Eczema, Fibroids (12/20/2015), Fibromyalgia, Menorrhagia with regular cycle (12/20/2015), Rash, Reflux, Rhinitis, Vitamin D deficiency, and Vulvar itching (12/20/2015).   Outpatient Encounter Medications as of 08/08/2020  Medication Sig  . acetaminophen (TYLENOL) 500 MG tablet Take 1,000 mg by mouth as needed.  . cetirizine (ZYRTEC) 10 MG tablet Take 10 mg by mouth daily.  . diphenhydrAMINE (BENADRYL) 25 MG tablet Take 25 mg by mouth every 6 (six) hours as needed.  . docusate sodium (COLACE) 100 MG capsule Take 100 mg by mouth daily.   . DULoxetine (CYMBALTA) 60 MG capsule TAKE 1 CAPSULE BY MOUTH ONCE DAILY  . Etanercept (ENBREL MINI) 50 MG/ML SOCT Inject 50 mg into the skin once a week.  Marland Kitchen ibuprofen (ADVIL,MOTRIN) 200 MG tablet Take 200 mg by mouth as needed.  . JUNEL FE 24 1-20 MG-MCG(24) tablet TAKE 1 TABLET BY MOUTH ONCE DAILY  . Melatonin 5 MG CAPS Take by mouth at bedtime.  . Multiple Vitamins-Minerals (MULTIVITAMIN WOMEN PO) Take by mouth daily.  . traZODone (DESYREL) 50 MG tablet Take 1 tablet (50 mg  total) by mouth at bedtime. (Patient taking differently: Take 25 mg by mouth at bedtime as needed.)  . zolpidem (AMBIEN) 5 MG tablet Take 1 tablet (5 mg total) by mouth at bedtime as needed for sleep.  . [DISCONTINUED] amLODipine (NORVASC) 5 MG tablet Take 1 tablet (5 mg total) by mouth daily. for blood pressure  . [DISCONTINUED] hydrochlorothiazide (HYDRODIURIL) 12.5 MG tablet Take 1 tablet (12.5 mg total) by mouth daily.  . [DISCONTINUED] losartan (COZAAR) 50 MG tablet Take 1 tablet (50 mg total) by mouth daily.  Marland Kitchen amLODipine (NORVASC) 5 MG tablet Take 1 tablet (5 mg total) by mouth daily. for blood pressure  . hydrochlorothiazide (HYDRODIURIL) 12.5 MG tablet Take 1 tablet (12.5 mg total) by mouth daily.  Marland Kitchen losartan (COZAAR) 50 MG tablet Take 1 tablet (50 mg total) by mouth daily.  . [DISCONTINUED] ferrous sulfate 325 (65 FE) MG tablet Take 325 mg by mouth daily with breakfast.    No facility-administered encounter medications on file as of 08/08/2020.     Review of Systems  Constitutional: Negative for chills and fever.  HENT: Negative for congestion, rhinorrhea and sore throat.   Respiratory: Negative for cough, shortness of breath and wheezing.   Cardiovascular: Negative for chest pain and leg swelling.  Gastrointestinal: Negative for abdominal pain, diarrhea, nausea and vomiting.  Genitourinary: Negative for dysuria and frequency.  Musculoskeletal: Negative for arthralgias and back  pain.  Skin: Negative for rash.  Neurological: Negative for dizziness, weakness and headaches.     Vitals BP 122/64   Pulse 93   Temp 97.6 F (36.4 C)   Ht $R'5\' 5"'UU$  (1.651 m)   Wt 208 lb 12.8 oz (94.7 kg)   SpO2 98%   BMI 34.75 kg/m   Objective:   Physical Exam Vitals and nursing note reviewed.  Constitutional:      Appearance: Normal appearance.  HENT:     Head: Normocephalic and atraumatic.     Nose: Nose normal.     Mouth/Throat:     Mouth: Mucous membranes are moist.     Pharynx:  Oropharynx is clear.  Eyes:     Extraocular Movements: Extraocular movements intact.     Conjunctiva/sclera: Conjunctivae normal.     Pupils: Pupils are equal, round, and reactive to light.  Cardiovascular:     Rate and Rhythm: Normal rate and regular rhythm.     Pulses: Normal pulses.     Heart sounds: Normal heart sounds. No murmur heard.   Pulmonary:     Effort: Pulmonary effort is normal.     Breath sounds: Normal breath sounds. No wheezing, rhonchi or rales.  Musculoskeletal:        General: Normal range of motion.     Right lower leg: No edema.     Left lower leg: No edema.  Skin:    General: Skin is warm and dry.     Findings: No lesion or rash.  Neurological:     General: No focal deficit present.     Mental Status: She is alert and oriented to person, place, and time.  Psychiatric:        Mood and Affect: Mood normal.        Behavior: Behavior normal.      Assessment and Plan   1. Essential hypertension - amLODipine (NORVASC) 5 MG tablet; Take 1 tablet (5 mg total) by mouth daily. for blood pressure  Dispense: 90 tablet; Refill: 1 - hydrochlorothiazide (HYDRODIURIL) 12.5 MG tablet; Take 1 tablet (12.5 mg total) by mouth daily.  Dispense: 90 tablet; Refill: 1 - losartan (COZAAR) 50 MG tablet; Take 1 tablet (50 mg total) by mouth daily.  Dispense: 90 tablet; Refill: 1 - CMP14+EGFR - CBC - Lipid panel   htn- stable. Cont meds. Reviewed labs from 11/21. Pt to f/u in 61mo and rechk labs at that time.  F/u 62mo or prn.

## 2020-08-11 ENCOUNTER — Encounter: Payer: Self-pay | Admitting: Rheumatology

## 2020-08-11 ENCOUNTER — Other Ambulatory Visit: Payer: Self-pay

## 2020-08-11 DIAGNOSIS — M0579 Rheumatoid arthritis with rheumatoid factor of multiple sites without organ or systems involvement: Secondary | ICD-10-CM

## 2020-08-12 ENCOUNTER — Other Ambulatory Visit: Payer: Self-pay | Admitting: Pharmacist

## 2020-08-12 DIAGNOSIS — M0579 Rheumatoid arthritis with rheumatoid factor of multiple sites without organ or systems involvement: Secondary | ICD-10-CM

## 2020-08-12 MED ORDER — ENBREL MINI 50 MG/ML ~~LOC~~ SOCT: 50.0000 mg | SUBCUTANEOUS | 0 refills | Status: DC

## 2020-08-12 NOTE — Telephone Encounter (Signed)
Last Visit: 06/27/2020 Next Visit: 09/19/2020 Labs: 06/10/2020 CBC is normal. LFTs were elevated.  TB Gold: 06/10/2020  Current Dose per office note 06/27/2020: Enbrel 50 mg sq injection once weekly  DX: Rheumatoid arthritis involving multiple sites with positive rheumatoid factor   Okay to refill Enbrel?

## 2020-08-13 MED FILL — ENBREL MINI 50 MG/ML SOCT: 50 | 28 days supply | Qty: 4 | Fill #0

## 2020-08-16 ENCOUNTER — Telehealth: Payer: Self-pay | Admitting: Pharmacist

## 2020-08-16 NOTE — Telephone Encounter (Signed)
Received notification from [facility] regarding a prior authorization for ENBREL. Authorization has been APPROVED from 08/12/20 to 08/11/21.   Authorization # [id number] Phone # [phone]  Knox Saliva, PharmD, MPH Clinical Pharmacist (Rheumatology and Pulmonology)

## 2020-09-05 ENCOUNTER — Telehealth: Payer: Self-pay

## 2020-09-05 MED FILL — ENBREL MINI 50 MG/ML SOCT: 50 | 28 days supply | Qty: 4 | Fill #1

## 2020-09-05 NOTE — Telephone Encounter (Signed)
Needs to ask a question will not go into detail she wanted to speak with Karen Burton   Pt call back [phone]

## 2020-09-05 NOTE — Progress Notes (Signed)
Office Visit Note  Patient: Karen Burton             Date of Birth: 02-Oct-1975           MRN: [id number]             PCP: Erven Colla, DO Referring: No ref. provider found Visit Date: 09/19/2020 Occupation: @GUAROCC @  Subjective:  Medication Management   History of Present Illness: Karen Burton is a 45 y.o. female with the history of seropositive rheumatoid arthritis and fibromyalgia syndrome.  She states she has been having increased pain and discomfort in her neck which is around the trapezius region and her lower back.  She states her fibromyalgia is flaring with the weather change.  She believes that her rheumatoid arthritis is much better controlled on Enbrel.  She continues to have some joint discomfort but no joint swelling.  She describes discomfort mostly around her shoulders and in the trochanteric region.  Activities of Daily Living:  Patient reports morning stiffness for 1 hour.   Patient Reports nocturnal pain.  Difficulty dressing/grooming: Denies Difficulty climbing stairs: Denies Difficulty getting out of chair: Denies Difficulty using hands for taps, buttons, cutlery, and/or writing: Denies  Review of Systems  Constitutional: Positive for fatigue. Negative for night sweats, weight gain and weight loss.  HENT: Positive for mouth dryness. Negative for mouth sores, trouble swallowing, trouble swallowing and nose dryness.        Nose sores  Eyes: Positive for dryness. Negative for pain, redness, itching and visual disturbance.  Respiratory: Negative for cough, shortness of breath and difficulty breathing.   Cardiovascular: Negative for chest pain, palpitations, hypertension, irregular heartbeat and swelling in legs/feet.  Gastrointestinal: Positive for constipation. Negative for blood in stool and diarrhea.  Endocrine: Negative for increased urination.  Genitourinary: Negative for difficulty urinating and vaginal dryness.  Musculoskeletal:  Positive for arthralgias, joint pain, myalgias, morning stiffness, muscle tenderness and myalgias. Negative for joint swelling and muscle weakness.  Skin: Negative for color change, rash, hair loss, redness, skin tightness, ulcers and sensitivity to sunlight.  Allergic/Immunologic: Negative for susceptible to infections.  Neurological: Positive for headaches. Negative for dizziness, numbness, memory loss, night sweats and weakness.  Hematological: Positive for bruising/bleeding tendency. Negative for swollen glands.  Psychiatric/Behavioral: Positive for depressed mood and sleep disturbance. Negative for confusion. The patient is not nervous/anxious.     PMFS History:  Patient Active Problem List   Diagnosis Date Noted  . Encounter for colorectal cancer screening 11/19/2019  . Weight gain 11/19/2019  . Sleep disturbance 11/19/2019  . Visit for gynecologic examination 11/19/2019  . Screening for colorectal cancer 02/13/2019  . Encounter for gynecological examination with Papanicolaou smear of cervix 02/13/2019  . Fibroids 02/13/2019  . Encounter for surveillance of contraceptive pills 08/12/2017  . Encounter for well woman exam with routine gynecological exam 08/12/2017  . ANA positive 11/06/2016  . Fibromyalgia 11/06/2016  . Menorrhagia with regular cycle 12/20/2015  . Dysmenorrhea 12/20/2015  . Uterine leiomyoma 12/20/2015  . Vulvar itching 12/20/2015  . Anemia 12/20/2015  . Essential hypertension 01/12/2013  . Esophageal reflux 01/12/2013  . Rheumatoid arthritis ([place]) 01/12/2013  . Rash and nonspecific skin eruption 01/12/2013    Past Medical History:  Diagnosis Date  . Anemia   . Arthritis   . Dysmenorrhea 12/20/2015  . Eczema    mild  . Fibroids 12/20/2015  . Fibromyalgia   . Menorrhagia with regular cycle 12/20/2015  . Rash   . Reflux   .  Rhinitis    chonic  . Vitamin D deficiency   . Vulvar itching 12/20/2015    Family History  Problem Relation Age of Onset  .  Diabetes Mother   . Hypertension Mother   . Asthma Mother   . Cancer Mother        breast  . Thyroid disease Mother   . Arthritis Mother   . Graves' disease Mother   . Cancer Maternal Grandmother        pancreatic  . Fibroids Sister   . COPD Maternal Aunt   . Asthma Maternal Aunt   . Gout Maternal Aunt    Past Surgical History:  Procedure Laterality Date  . ESOPHAGOGASTRODUODENOSCOPY ENDOSCOPY    . IR GENERIC HISTORICAL  02/07/2016   IR RADIOLOGIST EVAL & MGMT 02/07/2016 [name], MD [facility] INTERV RAD  . WISDOM TOOTH EXTRACTION     Social History   Social History Narrative  . Not on file   Immunization History  Administered Date(s) Administered  . DT (Pediatric) 02/20/2013  . Influenza-Unspecified 07/18/2009, 05/16/2016, 06/03/2017, 04/07/2018  . PFIZER(Purple Top)SARS-COV-2 Vaccination 04/01/2020, 04/22/2020  . Pneumococcal Polysaccharide-23 05/08/2007     Objective: Vital Signs: BP 135/89 (BP Location: Left Arm, Patient Position: Sitting, Cuff Size: Normal)   Pulse 92   Resp 15   Ht 5\' 5"  (1.651 m)   Wt 208 lb 12.8 oz (94.7 kg)   BMI 34.75 kg/m    Physical Exam Vitals and nursing note reviewed.  Constitutional:      Appearance: She is well-developed and well-nourished.  HENT:     Head: Normocephalic and atraumatic.  Eyes:     Extraocular Movements: EOM normal.     Conjunctiva/sclera: Conjunctivae normal.  Cardiovascular:     Rate and Rhythm: Normal rate and regular rhythm.     Pulses: Intact distal pulses.     Heart sounds: Normal heart sounds.  Pulmonary:     Effort: Pulmonary effort is normal.     Breath sounds: Normal breath sounds.  Abdominal:     General: Bowel sounds are normal.     Palpations: Abdomen is soft.  Musculoskeletal:     Cervical back: Normal range of motion.  Lymphadenopathy:     Cervical: No cervical adenopathy.  Skin:    General: Skin is warm and dry.     Capillary Refill: Capillary refill takes less than 2 seconds.   Neurological:     Mental Status: She is alert and oriented to person, place, and time.  Psychiatric:        Mood and Affect: Mood and affect normal.        Behavior: Behavior normal.      Musculoskeletal Exam: C-spine, thoracic and lumbar spine with good range of motion.  She had bilateral trapezius spasm and tenderness.  Shoulder joints, elbow joints, wrist joints, MCPs PIPs and DIPs with good range of motion with no synovitis.  Hip joints, knee joints, ankles, MTPs and PIPs with good range of motion with no synovitis.  She had tenderness at the deltoid insertion over the bilateral trochanteric bursa and over the medial aspect of her knees.  CDAI Exam: CDAI Score: 0.4  Patient Global: 2 mm; Provider Global: 2 mm Swollen: 0 ; Tender: 0  Joint Exam 09/19/2020   No joint exam has been documented for this visit   There is currently no information documented on the homunculus. Go to the Rheumatology activity and complete the homunculus joint exam.  Investigation: No additional findings.  Imaging: No results found.  Recent Labs: Lab Results  Component Value Date   WBC 6.3 06/10/2020   HGB 12.2 06/10/2020   PLT 246 06/10/2020   NA 141 06/10/2020   K 3.5 06/10/2020   CL 103 06/10/2020   CO2 26 06/10/2020   GLUCOSE 110 (H) 06/10/2020   BUN 9 06/10/2020   CREATININE 0.88 06/10/2020   BILITOT <0.2 06/10/2020   ALKPHOS 78 06/10/2020   AST 31 (H) 07/13/2020   ALT 30 (H) 07/13/2020   PROT 6.9 06/10/2020   ALBUMIN 3.9 06/10/2020   CALCIUM 9.3 06/10/2020   GFRAA 92 06/10/2020   QFTBGOLDPLUS NEGATIVE 10/23/2019   06/10/2020 TB GOLD negative  Speciality Comments: Prior therapy includes: methotrexate and Plaquenil (neutropenia) and Humira (painful injections)  Procedures:  No procedures performed Allergies: Ceftin [cefuroxime axetil], Humira [adalimumab], and Methotrexate derivatives   Assessment / Plan:     Visit Diagnoses: Rheumatoid arthritis involving multiple sites with  positive rheumatoid factor (HCC)-she complains of pain and discomfort in the muscles and joints.  No synovitis was noted on the examination today.  High risk medication use - Enbrel 50 mg sq injection once weekly.  - Plan: CBC with Differential/Platelet, COMPLETE METABOLIC PANEL WITH GFR, Sedimentation rate in March and then every 3 months to monitor for drug toxicity.  Patient will get labs at her work  Fibromyalgia-her fibromyalgia is flaring with generalized pain and discomfort.  She is on Cymbalta 60 mg p.o. daily.  Need for regular exercise was emphasized.  I offered further therapy and physical therapy which she declined due to high co-pay.  I would encourage her to join the gym and exercise on a regular basis.  Other fatigue-she continues to have fatigue due to insomnia and fibromyalgia.  History of insomnia-she is on trazodone and melatonin for chronic insomnia.  History of hypertension-her blood pressure is mildly elevated today.  History of gastroesophageal reflux (GERD)  Educated about COVID-19 virus infection-she is fully vaccinated against COVID-19.  She was advised to get a third dose and then 6 months later a fourth dose which is recommended for immunocompromised individuals.  Use of muscular social distancing and hand hygiene was discussed.  Orders: Orders Placed This Encounter  Procedures  . CBC with Differential/Platelet  . COMPLETE METABOLIC PANEL WITH GFR  . Sedimentation rate   No orders of the defined types were placed in this encounter.    Follow-Up Instructions: Return in about 5 months (around 02/16/2021) for Rheumatoid arthritis, FMS.   Bo Merino, MD  Note - This record has been created using Editor, commissioning.  Chart creation errors have been sought, but may not always  have been located. Such creation errors do not reflect on  the standard of medical care.

## 2020-09-19 ENCOUNTER — Other Ambulatory Visit: Payer: Self-pay

## 2020-09-19 ENCOUNTER — Encounter: Payer: Self-pay | Admitting: Rheumatology

## 2020-09-19 ENCOUNTER — Ambulatory Visit: Payer: 59 | Admitting: Rheumatology

## 2020-09-19 VITALS — BP 135/89 | HR 92 | Resp 15 | Ht 65.0 in | Wt 208.8 lb

## 2020-09-19 DIAGNOSIS — Z87898 Personal history of other specified conditions: Secondary | ICD-10-CM | POA: Diagnosis not present

## 2020-09-19 DIAGNOSIS — Z7189 Other specified counseling: Secondary | ICD-10-CM

## 2020-09-19 DIAGNOSIS — M0579 Rheumatoid arthritis with rheumatoid factor of multiple sites without organ or systems involvement: Secondary | ICD-10-CM | POA: Diagnosis not present

## 2020-09-19 DIAGNOSIS — R202 Paresthesia of skin: Secondary | ICD-10-CM

## 2020-09-19 DIAGNOSIS — Z8679 Personal history of other diseases of the circulatory system: Secondary | ICD-10-CM | POA: Diagnosis not present

## 2020-09-19 DIAGNOSIS — M797 Fibromyalgia: Secondary | ICD-10-CM

## 2020-09-19 DIAGNOSIS — Z8719 Personal history of other diseases of the digestive system: Secondary | ICD-10-CM | POA: Diagnosis not present

## 2020-09-19 DIAGNOSIS — Z79899 Other long term (current) drug therapy: Secondary | ICD-10-CM | POA: Diagnosis not present

## 2020-09-19 DIAGNOSIS — R748 Abnormal levels of other serum enzymes: Secondary | ICD-10-CM

## 2020-09-19 DIAGNOSIS — R5383 Other fatigue: Secondary | ICD-10-CM

## 2020-09-19 NOTE — Patient Instructions (Addendum)
Standing Labs We placed an order today for your standing lab work.   Please have your standing labs drawn in March and every 3 months  If possible, please have your labs drawn 2 weeks prior to your appointment so that the provider can discuss your results at your appointment.  We have open lab daily Monday through Thursday from 1:30-4:30 PM and Friday from 1:30-4:00 PM at the office of Dr. Bo Merino, [facility].   Please be advised, all patients with office appointments requiring lab work will take precedents over walk-in lab work.  If possible, please come for your lab work on Monday and Friday afternoons, as you may experience shorter wait times. The office is located at [address] [place], [place], [place] [address] No appointment is necessary.   Labs are drawn by [facility]. Please bring your co-pay at the time of your lab draw.  You may receive a bill from [facility] for your lab work.  If you wish to have your labs drawn at another location, please call the office 24 hours in advance to send orders.  If you have any questions regarding directions or hours of operation,  please call [phone].   As a reminder, please drink plenty of water prior to coming for your lab work. Thanks!   COVID-19 vaccine recommendations:   COVID-19 vaccine is recommended for everyone (unless you are allergic to a vaccine component), even if you are on a medication that suppresses your immune system.   It is recommended that individuals on immunosuppressive agents should get COVID-19 vaccine first 3 doses 1 month apart and then a fourth vaccine (booster) 6 months from the third dose.  Do not take Tylenol or any anti-inflammatory medications (NSAIDs) 24 hours prior to the COVID-19 vaccination.   There is no direct evidence about the efficacy of the COVID-19 vaccine in individuals who are on medications that suppress the immune system.   Even if you are fully vaccinated, and you are  on any medications that suppress your immune system, please continue to wear a mask, maintain at least six feet social distance and practice hand hygiene.   If you develop a COVID-19 infection, please contact your PCP or our office to determine if you need monoclonal antibody infusion.  The booster vaccine is now available for immunocompromised patients.   Please see the following web sites for updated information.   [email or web address]-Patient-Resources.[email or web address]  Heart Disease Prevention   Your inflammatory disease increases your risk of heart disease which includes heart attack, stroke, atrial fibrillation (irregular heartbeats), high blood pressure, heart failure and atherosclerosis (plaque in the arteries).  It is important to reduce your risk by:   . Keep blood pressure, cholesterol, and blood sugar at healthy levels   . Smoking Cessation   . Maintain a healthy weight  o BMI 20-25   . Eat a healthy diet  o Plenty of fresh fruit, vegetables, and whole grains  o Limit saturated fats, foods high in sodium, and added sugars  o DASH and Mediterranean diet   . Increase physical activity  o Recommend moderate physically activity for 150 minutes per week/ 30 minutes a day for five days a week These can be broken up into three separate ten-minute sessions during the day.   . Reduce Stress  . Meditation, slow breathing exercises, yoga, coloring books  . Dental visits twice a year

## 2020-09-23 ENCOUNTER — Other Ambulatory Visit: Payer: Self-pay | Admitting: Rheumatology

## 2020-09-23 NOTE — Telephone Encounter (Signed)
Last Visit: 09/19/2020 Next Visit: 02/20/2021  Current Dose per office note on 09/19/2020, Cymbalta 60 mg p.o. daily Dx: Fibromyalgia  Last Fill:06/22/2020  Okay to refill Cymbalta?

## 2020-10-05 MED FILL — ENBREL MINI 50 MG/ML SOCT: 50 | 28 days supply | Qty: 4 | Fill #2

## 2020-10-25 ENCOUNTER — Other Ambulatory Visit: Payer: Self-pay | Admitting: *Deleted

## 2020-10-25 ENCOUNTER — Telehealth: Payer: Self-pay

## 2020-10-25 ENCOUNTER — Other Ambulatory Visit: Payer: Self-pay | Admitting: Physician Assistant

## 2020-10-25 ENCOUNTER — Other Ambulatory Visit (HOSPITAL_COMMUNITY): Payer: Self-pay

## 2020-10-25 DIAGNOSIS — Z79899 Other long term (current) drug therapy: Secondary | ICD-10-CM

## 2020-10-25 DIAGNOSIS — M0579 Rheumatoid arthritis with rheumatoid factor of multiple sites without organ or systems involvement: Secondary | ICD-10-CM

## 2020-10-25 NOTE — Telephone Encounter (Addendum)
Next Visit: 02/20/2021  Last Visit: 09/19/2020  Last Fill:  08/02/2020  IP:[id number] arthritis involving multiple sites with positive rheumatoid factor   Current Dose per office note 09/19/2020, Enbrel 50 mg sq injection once weekly  Labs: 06/10/2020, CBC is normal. LFTs were elevated. Please advise patient to avoid Tylenol, NSAIDs and alcohol use.  [facility] labs are due.  TB Gold: 11/12/2021negative  Okay to refill Enbrel?

## 2020-10-25 NOTE — Telephone Encounter (Signed)
Labs are past due.  She had labs in November.  You may send 30-day supply with no refill.  She needs labs as soon as possible.

## 2020-10-25 NOTE — Telephone Encounter (Signed)
Labs released for [facility] and faxed

## 2020-10-25 NOTE — Addendum Note (Signed)
Addended by: Shona Needles on: 10/25/2020 02:12 PM   Modules accepted: Orders

## 2020-10-25 NOTE — Telephone Encounter (Signed)
Patient called requesting her labwork orders be faxed to  [facility] in [place] [phone].    Patient states they use [facility].

## 2020-10-26 ENCOUNTER — Other Ambulatory Visit: Payer: Self-pay | Admitting: Pharmacist

## 2020-10-26 DIAGNOSIS — M0579 Rheumatoid arthritis with rheumatoid factor of multiple sites without organ or systems involvement: Secondary | ICD-10-CM

## 2020-10-26 MED ORDER — ENBREL MINI 50 MG/ML ~~LOC~~ SOCT: 50.0000 mg | SUBCUTANEOUS | 0 refills | Status: DC

## 2020-10-27 MED FILL — ENBREL MINI 50 MG/ML SOCT: 50 | 28 days supply | Qty: 4 | Fill #0

## 2020-10-29 ENCOUNTER — Other Ambulatory Visit: Payer: Self-pay | Admitting: Rheumatology

## 2020-10-29 ENCOUNTER — Other Ambulatory Visit (HOSPITAL_COMMUNITY): Payer: Self-pay

## 2020-10-29 DIAGNOSIS — M0579 Rheumatoid arthritis with rheumatoid factor of multiple sites without organ or systems involvement: Secondary | ICD-10-CM

## 2020-10-31 ENCOUNTER — Other Ambulatory Visit (HOSPITAL_COMMUNITY): Payer: Self-pay

## 2020-11-01 ENCOUNTER — Other Ambulatory Visit (HOSPITAL_COMMUNITY): Payer: Self-pay

## 2020-11-02 DIAGNOSIS — M0579 Rheumatoid arthritis with rheumatoid factor of multiple sites without organ or systems involvement: Secondary | ICD-10-CM | POA: Diagnosis not present

## 2020-11-02 DIAGNOSIS — Z79899 Other long term (current) drug therapy: Secondary | ICD-10-CM | POA: Diagnosis not present

## 2020-11-03 LAB — CBC WITH DIFFERENTIAL/PLATELET
Basophils Absolute: 0 10*3/uL (ref 0.0–0.2)
Basos: 1 %
EOS (ABSOLUTE): 0 10*3/uL (ref 0.0–0.4)
Eos: 1 %
Hematocrit: 38.4 % (ref 34.0–46.6)
Hemoglobin: 12.9 g/dL (ref 11.1–15.9)
Immature Grans (Abs): 0 10*3/uL (ref 0.0–0.1)
Immature Granulocytes: 0 %
Lymphocytes Absolute: 2 10*3/uL (ref 0.7–3.1)
Lymphs: 32 %
MCH: 29.8 pg (ref 26.6–33.0)
MCHC: 33.6 g/dL (ref 31.5–35.7)
MCV: 89 fL (ref 79–97)
Monocytes Absolute: 0.5 10*3/uL (ref 0.1–0.9)
Monocytes: 9 %
Neutrophils Absolute: 3.6 10*3/uL (ref 1.4–7.0)
Neutrophils: 57 %
Platelets: 259 10*3/uL (ref 150–450)
RBC: 4.33 x10E6/uL (ref 3.77–5.28)
RDW: 13.1 % (ref 11.7–15.4)
WBC: 6.2 10*3/uL (ref 3.4–10.8)

## 2020-11-03 LAB — CMP14+EGFR
ALT: 37 IU/L — ABNORMAL HIGH (ref 0–32)
AST: 30 IU/L (ref 0–40)
Albumin/Globulin Ratio: 1.5 (ref 1.2–2.2)
Albumin: 4.1 g/dL (ref 3.8–4.8)
Alkaline Phosphatase: 83 IU/L (ref 44–121)
BUN/Creatinine Ratio: 11 (ref 9–23)
BUN: 9 mg/dL (ref 6–24)
Bilirubin Total: 0.3 mg/dL (ref 0.0–1.2)
CO2: 23 mmol/L (ref 20–29)
Calcium: 9.6 mg/dL (ref 8.7–10.2)
Chloride: 101 mmol/L (ref 96–106)
Creatinine, Ser: 0.85 mg/dL (ref 0.57–1.00)
Globulin, Total: 2.8 g/dL (ref 1.5–4.5)
Glucose: 89 mg/dL (ref 65–99)
Potassium: 3.8 mmol/L (ref 3.5–5.2)
Sodium: 141 mmol/L (ref 134–144)
Total Protein: 6.9 g/dL (ref 6.0–8.5)
eGFR: 86 mL/min/{1.73_m2} (ref >59)

## 2020-11-03 LAB — SEDIMENTATION RATE: Sed Rate: 47 mm/hr — ABNORMAL HIGH (ref 0–32)

## 2020-11-07 ENCOUNTER — Other Ambulatory Visit: Payer: Self-pay | Admitting: *Deleted

## 2020-11-07 NOTE — Progress Notes (Signed)
Ultrasound of both hands in December 2021 was negative for synovitis.    Please ask if the patient would like to proceed with a total body bone scan for further evaluation of the joint pain and elevated sed rate.

## 2020-11-15 ENCOUNTER — Other Ambulatory Visit (HOSPITAL_COMMUNITY): Payer: Self-pay

## 2020-11-16 ENCOUNTER — Other Ambulatory Visit: Payer: Self-pay | Admitting: Rheumatology

## 2020-11-16 ENCOUNTER — Other Ambulatory Visit (HOSPITAL_COMMUNITY): Payer: Self-pay

## 2020-11-16 DIAGNOSIS — M0579 Rheumatoid arthritis with rheumatoid factor of multiple sites without organ or systems involvement: Secondary | ICD-10-CM

## 2020-11-17 ENCOUNTER — Other Ambulatory Visit (HOSPITAL_COMMUNITY): Payer: Self-pay

## 2020-11-17 ENCOUNTER — Other Ambulatory Visit: Payer: Self-pay | Admitting: Pharmacist

## 2020-11-17 DIAGNOSIS — M0579 Rheumatoid arthritis with rheumatoid factor of multiple sites without organ or systems involvement: Secondary | ICD-10-CM

## 2020-11-17 MED ORDER — ENBREL MINI 50 MG/ML ~~LOC~~ SOCT
50.0000 mg | SUBCUTANEOUS | 2 refills | Status: DC
  Filled 2020-11-17 – 2020-12-01 (×2): qty 4, 28d supply, fill #0
  Filled 2020-12-21: qty 4, 28d supply, fill #1
  Filled 2021-01-18: qty 4, 28d supply, fill #2

## 2020-11-17 MED ORDER — ENBREL MINI 50 MG/ML ~~LOC~~ SOCT
50.0000 mg | SUBCUTANEOUS | 2 refills | Status: DC
  Filled 2020-11-17: qty 4, fill #0

## 2020-11-17 NOTE — Telephone Encounter (Signed)
Next Visit: 02/20/2021  Last Visit: 09/19/2020   Last Fill: 10/26/2020  DX:  Rheumatoid arthritis involving multiple sites with positive rheumatoid factor   Current Dose per office note 09/19/2020, Enbrel 50 mg sq injection once weekly.  Labs: 11/02/2020, CBC WNL. ALT is borderline elevated. AST WNL. Rest of CMP WNL. Please encourage the patient to avoid tylenol, NSAIDs, and alcohol use.  ESR is elevated-47. Please clarify if she has been experiencing any joint swelling recently. Please advise the patient to have ESR rechecked in 1 month.   TB Gold: 06/10/2020, negative  Okay to refill Enbrel?

## 2020-11-17 NOTE — Telephone Encounter (Signed)
Please review

## 2020-11-18 ENCOUNTER — Other Ambulatory Visit (HOSPITAL_COMMUNITY): Payer: Self-pay

## 2020-11-19 ENCOUNTER — Other Ambulatory Visit (HOSPITAL_COMMUNITY): Payer: Self-pay

## 2020-11-21 ENCOUNTER — Other Ambulatory Visit (HOSPITAL_COMMUNITY): Payer: Self-pay

## 2020-11-21 ENCOUNTER — Other Ambulatory Visit: Payer: Self-pay | Admitting: *Deleted

## 2020-11-21 ENCOUNTER — Other Ambulatory Visit: Payer: Self-pay

## 2020-11-21 ENCOUNTER — Other Ambulatory Visit: Payer: Self-pay | Admitting: Adult Health

## 2020-11-21 ENCOUNTER — Telehealth: Payer: Self-pay | Admitting: Adult Health

## 2020-11-21 ENCOUNTER — Other Ambulatory Visit: Payer: 59 | Admitting: Adult Health

## 2020-11-21 MED ORDER — NORETHIN ACE-ETH ESTRAD-FE 1-20 MG-MCG(24) PO TABS
ORAL_TABLET | ORAL | 0 refills | Status: DC
  Filled 2020-11-21: qty 84, 84d supply, fill #0

## 2020-11-21 NOTE — Progress Notes (Signed)
Refilled OCs 

## 2020-11-21 NOTE — Telephone Encounter (Signed)
Patient would like a refill on Birth Control until sheduled appt on 01/02/21. Clinical staff will follow up with patient.

## 2020-11-21 NOTE — Telephone Encounter (Signed)
Attempted to call patient to see which pharmacy to send rx to. Multiple on file. Also sent mychart message.

## 2020-11-23 ENCOUNTER — Other Ambulatory Visit: Payer: Self-pay

## 2020-11-23 MED FILL — Duloxetine HCl Enteric Coated Pellets Cap 60 MG (Base Eq): ORAL | 30 days supply | Qty: 30 | Fill #0 | Status: AC

## 2020-11-24 ENCOUNTER — Other Ambulatory Visit: Payer: Self-pay

## 2020-11-24 ENCOUNTER — Ambulatory Visit (INDEPENDENT_AMBULATORY_CARE_PROVIDER_SITE_OTHER): Payer: 59 | Admitting: Family Medicine

## 2020-11-24 ENCOUNTER — Encounter: Payer: Self-pay | Admitting: Family Medicine

## 2020-11-24 VITALS — HR 101 | Temp 97.2°F | Resp 16 | Wt 212.6 lb

## 2020-11-24 DIAGNOSIS — B9689 Other specified bacterial agents as the cause of diseases classified elsewhere: Secondary | ICD-10-CM | POA: Diagnosis not present

## 2020-11-24 DIAGNOSIS — J019 Acute sinusitis, unspecified: Secondary | ICD-10-CM

## 2020-11-24 MED ORDER — AMOXICILLIN-POT CLAVULANATE 875-125 MG PO TABS: 1.0000 | ORAL_TABLET | Freq: Two times a day (BID) | ORAL | 0 refills | Status: DC

## 2020-11-24 NOTE — Patient Instructions (Signed)
Sinusitis, Adult Sinusitis is soreness and swelling (inflammation) of your sinuses. Sinuses are hollow spaces in the bones around your face. They are located:  Around your eyes.  In the middle of your forehead.  Behind your nose.  In your cheekbones. Your sinuses and nasal passages are lined with a fluid called mucus. Mucus drains out of your sinuses. Swelling can trap mucus in your sinuses. This lets germs (bacteria, virus, or fungus) grow, which leads to infection. Most of the time, this condition is caused by a virus. What are the causes? This condition is caused by:  Allergies.  Asthma.  Germs.  Things that block your nose or sinuses.  Growths in the nose (nasal polyps).  Chemicals or irritants in the air.  Fungus (rare). What increases the risk? You are more likely to develop this condition if:  You have a weak body defense system (immune system).  You do a lot of swimming or diving.  You use nasal sprays too much.  You smoke. What are the signs or symptoms? The main symptoms of this condition are pain and a feeling of pressure around the sinuses. Other symptoms include:  Stuffy nose (congestion).  Runny nose (drainage).  Swelling and warmth in the sinuses.  Headache.  Toothache.  A cough that may get worse at night.  Mucus that collects in the throat or the back of the nose (postnasal drip).  Being unable to smell and taste.  Being very tired (fatigue).  A fever.  Sore throat.  Bad breath. How is this diagnosed? This condition is diagnosed based on:  Your symptoms.  Your medical history.  A physical exam.  Tests to find out if your condition is short-term (acute) or long-term (chronic). Your doctor may: ? Check your nose for growths (polyps). ? Check your sinuses using a tool that has a light (endoscope). ? Check for allergies or germs. ? Do imaging tests, such as an MRI or CT scan. How is this treated? Treatment for this condition  depends on the cause and whether it is short-term or long-term.  If caused by a virus, your symptoms should go away on their own within 10 days. You may be given medicines to relieve symptoms. They include: ? Medicines that shrink swollen tissue in the nose. ? Medicines that treat allergies (antihistamines). ? A spray that treats swelling of the nostrils. ? Rinses that help get rid of thick mucus in your nose (nasal saline washes).  If caused by bacteria, your doctor may wait to see if you will get better without treatment. You may be given antibiotic medicine if you have: ? A very bad infection. ? A weak body defense system.  If caused by growths in the nose, you may need to have surgery. Follow these instructions at home: Medicines  Take, use, or apply over-the-counter and prescription medicines only as told by your doctor. These may include nasal sprays.  If you were prescribed an antibiotic medicine, take it as told by your doctor. Do not stop taking the antibiotic even if you start to feel better. Hydrate and humidify  Drink enough water to keep your pee (urine) pale yellow.  Use a cool mist humidifier to keep the humidity level in your home above 50%.  Breathe in steam for 10-15 minutes, 3-4 times a day, or as told by your doctor. You can do this in the bathroom while a hot shower is running.  Try not to spend time in cool or dry air.     Rest  Rest as much as you can.  Sleep with your head raised (elevated).  Make sure you get enough sleep each night. General instructions  Put a warm, moist washcloth on your face 3-4 times a day, or as often as told by your doctor. This will help with discomfort.  Wash your hands often with soap and water. If there is no soap and water, use hand sanitizer.  Do not smoke. Avoid being around people who are smoking (secondhand smoke).  Keep all follow-up visits as told by your doctor. This is important.   Contact a doctor if:  You  have a fever.  Your symptoms get worse.  Your symptoms do not get better within 10 days. Get help right away if:  You have a very bad headache.  You cannot stop throwing up (vomiting).  You have very bad pain or swelling around your face or eyes.  You have trouble seeing.  You feel confused.  Your neck is stiff.  You have trouble breathing. Summary  Sinusitis is swelling of your sinuses. Sinuses are hollow spaces in the bones around your face.  This condition is caused by tissues in your nose that become inflamed or swollen. This traps germs. These can lead to infection.  If you were prescribed an antibiotic medicine, take it as told by your doctor. Do not stop taking it even if you start to feel better.  Keep all follow-up visits as told by your doctor. This is important. This information is not intended to replace advice given to you by your health care provider. Make sure you discuss any questions you have with your health care provider. Document Revised: 12/16/2017 Document Reviewed: 12/16/2017 Elsevier Patient Education  2021 Else[employer] Inc. How to Perform a Sinus Rinse A sinus rinse is a home treatment. It rinses your sinuses with a mixture of salt and water (saline solution). Sinuses are air-filled spaces in your skull behind the bones of your face and forehead. They open into your nasal cavity. A sinus rinse can help to clear your nasal cavity. It can clear mucus, dirt, dust, or pollen. You may do a sinus rinse when you have:  A cold.  A virus.  Allergies.  A sinus infection.  A stuffy nose. Talk with your doctor about whether a sinus rinse might help you. What are the risks? A sinus rinse is normally very safe and helpful. However, there are a few risks. These include:  A burning feeling in the sinuses. This may happen if you do not make the saline solution as instructed. Be sure to follow all directions when making the saline solution.  Nasal  irritation.  Infection from unclean water. This is rare, but possible. Do not do a sinus rinse if you have had:  Ear or nasal surgery.  An ear infection.  Blocked ears. Supplies needed:  Saline solution or powder.  Distilled or germ-free (sterile) water may be needed to mix with saline powder. ? You may use boiled and cooled tap water. Boil tap water for 5 minutes; cool until it is lukewarm. Use within 24 hours. ? Do not use regular tap water to mix with the saline solution.  Neti pot or nasal rinse bottle. This releases the saline solution into your nose and through your sinuses. You can buy neti pots and rinse bottles: ? At your local pharmacy. ? At a health food store. ? Online. How to perform a sinus rinse 1. Wash your hands with soap and water.   2. Wash your device using the directions that came with it. 3. Dry your device. 4. Use the solution that comes with your device or one that is sold separately in stores. Follow the mixing directions on the package if you need to mix with sterile or distilled water. 5. Fill your device with the amount of saline solution stated in the device instructions. 6. Stand over a sink and tilt your head sideways over the sink. 7. Place the spout of the device in your upper nostril (the one closer to the ceiling). 8. Gently pour or squeeze the saline solution into your nasal cavity. The liquid should drain to your lower nostril if you are not too stuffed up (congested). 9. While rinsing, breathe through your open mouth. 10. Gently blow your nose to clear any mucus and rinse solution. Blowing too hard may cause ear pain. 11. Repeat in your other nostril. 12. Clean and rinse your device with clean water. 13. Air-dry your device. Talk with your doctor or pharmacist if you have questions about how to do a sinus rinse.   Summary  A sinus rinse is a home treatment. It rinses your sinuses with a mixture of salt and water (saline solution).  A sinus  rinse is normally very safe and helpful. Follow all instructions carefully.  Talk with your doctor about whether a sinus rinse might help you. This information is not intended to replace advice given to you by your health care provider. Make sure you discuss any questions you have with your health care provider. Document Revised: 04/26/2020 Document Reviewed: 04/26/2020 Elsevier Patient Education  2021 Elsevier Inc.  

## 2020-11-24 NOTE — Progress Notes (Signed)
Patient ID: Karen Burton, female    DOB: 1976-04-02, 45 y.o.   MRN: [id number]   Chief Complaint  Patient presents with  . Sinus Problem    Since weekend   Subjective:  CC: sinus issues.   This is a new problem.  Presents today with a complaint of sinus infection.  Reports that she has sinus drainage, sinus pain and pressure, headache, ear drainage and some tenderness in the lymph nodes under the chin.  Symptoms started last weekend.  Has tried Nasacort nasal spray, Sudafed, and Benadryl, and recently started Mucinex.  Denies fever, chills, chest pain, shortness of breath.  Endorses postnasal drip, sinus pain and pressure, voice change, cough and headaches.    Medical History Karen Burton has a past medical history of Anemia, Arthritis, Dysmenorrhea (12/20/2015), Eczema, Fibroids (12/20/2015), Fibromyalgia, Menorrhagia with regular cycle (12/20/2015), Rash, Reflux, Rhinitis, Vitamin D deficiency, and Vulvar itching (12/20/2015).   Outpatient Encounter Medications as of 11/24/2020  Medication Sig  . amoxicillin-clavulanate (AUGMENTIN) 875-125 MG tablet Take 1 tablet by mouth 2 (two) times daily.  Marland Kitchen acetaminophen (TYLENOL) 500 MG tablet Take 1,000 mg by mouth as needed.  Marland Kitchen amLODipine (NORVASC) 5 MG tablet TAKE 1 TABLET BY MOUTH DAILY. FOR BLOOD PRESSURE  . cetirizine (ZYRTEC) 10 MG tablet Take 10 mg by mouth daily.  . diphenhydrAMINE (BENADRYL) 25 MG tablet Take 25 mg by mouth every 6 (six) hours as needed.  . docusate sodium (COLACE) 100 MG capsule Take 100 mg by mouth daily.   . DULoxetine (CYMBALTA) 60 MG capsule TAKE 1 CAPSULE BY MOUTH ONCE DAILY  . Etanercept (ENBREL MINI) 50 MG/ML SOCT INJECT 50 MG INTO THE SKIN ONCE A WEEK.  . hydrochlorothiazide (HYDRODIURIL) 12.5 MG tablet TAKE 1 TABLET BY MOUTH DAILY.  Marland Kitchen ibuprofen (ADVIL,MOTRIN) 200 MG tablet Take 200 mg by mouth as needed.  Marland Kitchen losartan (COZAAR) 50 MG tablet TAKE 1 TABLET BY MOUTH DAILY.  . Melatonin 5 MG CAPS Take by mouth  at bedtime.  . Multiple Vitamins-Minerals (MULTIVITAMIN WOMEN PO) Take by mouth daily.  . Norethindrone Acetate-Ethinyl Estrad-FE (LOESTRIN 24 FE) 1-20 MG-MCG(24) tablet TAKE 1 TABLET BY MOUTH ONCE DAILY (USE NDC [phone])  . traZODone (DESYREL) 50 MG tablet Take 1 tablet (50 mg total) by mouth at bedtime. (Patient taking differently: Take 25 mg by mouth at bedtime as needed.)  . zolpidem (AMBIEN) 5 MG tablet Take 1 tablet (5 mg total) by mouth at bedtime as needed for sleep.   No facility-administered encounter medications on file as of 11/24/2020.     Review of Systems  Constitutional: Negative for chills and fever.  HENT: Positive for postnasal drip, sinus pressure, sinus pain and voice change.   Respiratory: Positive for cough. Negative for shortness of breath.   Cardiovascular: Negative for chest pain.  Neurological: Positive for headaches.     Vitals Pulse (!) 101   Temp (!) 97.2 F (36.2 C)   Resp 16   Wt 212 lb 9.6 oz (96.4 kg)   SpO2 97%   BMI 35.38 kg/m   Objective:   Physical Exam Vitals reviewed.  Constitutional:      Appearance: Normal appearance.  HENT:     Right Ear: Tympanic membrane normal.     Left Ear: Tympanic membrane normal.     Nose:     Right Turbinates: Swollen.     Left Turbinates: Swollen.     Right Sinus: Maxillary sinus tenderness and frontal sinus tenderness present.  Left Sinus: Maxillary sinus tenderness and frontal sinus tenderness present.     Mouth/Throat:     Pharynx: Oropharynx is clear.  Cardiovascular:     Rate and Rhythm: Normal rate and regular rhythm.     Heart sounds: Normal heart sounds.  Pulmonary:     Effort: Pulmonary effort is normal.     Breath sounds: Normal breath sounds.  Skin:    General: Skin is warm and dry.  Neurological:     General: No focal deficit present.     Mental Status: She is alert.  Psychiatric:        Behavior: Behavior normal.      Assessment and Plan   1. Acute bacterial  rhinosinusitis - amoxicillin-clavulanate (AUGMENTIN) 875-125 MG tablet; Take 1 tablet by mouth 2 (two) times daily.  Dispense: 20 tablet; Refill: 0    Reports she tolerates Augmentin well.  We will treat acute bacterial sinusitis with Augmentin for 10 days.  Recommend increasing fluids as her heart rate was elevated today at the office.  Recommend supportive therapy, sinus rinses, adequate hydration.  Agrees with plan of care discussed today. Understands warning signs to seek further care: chest pain, shortness of breath, any significant change in health.  Understands to follow-up if symptoms do not improve, or worsen.  Information on sinus rinses given at discharge.  Pecolia Ades, NP 11/24/20

## 2020-11-26 ENCOUNTER — Other Ambulatory Visit (HOSPITAL_COMMUNITY): Payer: Self-pay

## 2020-12-01 ENCOUNTER — Other Ambulatory Visit (HOSPITAL_COMMUNITY): Payer: Self-pay

## 2020-12-01 MED FILL — Hydrochlorothiazide Tab 12.5 MG: ORAL | 90 days supply | Qty: 90 | Fill #0 | Status: AC

## 2020-12-01 MED FILL — Amlodipine Besylate Tab 5 MG (Base Equivalent): ORAL | 90 days supply | Qty: 90 | Fill #0 | Status: AC

## 2020-12-02 ENCOUNTER — Other Ambulatory Visit: Payer: Self-pay

## 2020-12-04 ENCOUNTER — Encounter: Payer: Self-pay | Admitting: Emergency Medicine

## 2020-12-04 ENCOUNTER — Other Ambulatory Visit: Payer: Self-pay

## 2020-12-04 ENCOUNTER — Ambulatory Visit
Admission: EM | Admit: 2020-12-04 | Discharge: 2020-12-04 | Disposition: A | Discharge status: Home / Self Care | Payer: 59 | Attending: Family Medicine | Admitting: Family Medicine

## 2020-12-04 DIAGNOSIS — J3489 Other specified disorders of nose and nasal sinuses: Secondary | ICD-10-CM

## 2020-12-04 DIAGNOSIS — J029 Acute pharyngitis, unspecified: Secondary | ICD-10-CM

## 2020-12-04 DIAGNOSIS — R0981 Nasal congestion: Secondary | ICD-10-CM

## 2020-12-04 MED ORDER — PREDNISONE 20 MG PO TABS: 20.0000 mg | ORAL_TABLET | Freq: Every day | ORAL | 0 refills | Status: AC

## 2020-12-04 MED ORDER — PROMETHAZINE-DM 6.25-15 MG/5ML PO SYRP: 5.0000 mL | ORAL_SOLUTION | Freq: Four times a day (QID) | ORAL | 0 refills | Status: DC | PRN

## 2020-12-04 NOTE — Discharge Instructions (Addendum)
Resume Nasocort and continue allergy medication. Take prednisone as prescribed and complete entire course of antibiotics.

## 2020-12-04 NOTE — ED Provider Notes (Signed)
[facility]    CSN: [id number] Arrival date & time: 12/04/20  1152      History   Chief Complaint Chief Complaint  Patient presents with  . Sore Throat    HPI Karen Burton is a 45 y.o. female.   HPI  Patient presents today with ongoing sore throat and nasal congestion symptoms following 8 days of treatment with antibiotic which was prescribed for treatment of acute sinusitis.  Once starting antibiotic she will discontinue using her Nasonex.  She is concerned as her symptoms have not improved.  She has been afebrile.  When she was seen by her PCP she cannot COVID tested.  Unaware of any contact with anyone who has been sick.  Past Medical History:  Diagnosis Date  . Anemia   . Arthritis   . Dysmenorrhea 12/20/2015  . Eczema    mild  . Fibroids 12/20/2015  . Fibromyalgia   . Menorrhagia with regular cycle 12/20/2015  . Rash   . Reflux   . Rhinitis    chonic  . Vitamin D deficiency   . Vulvar itching 12/20/2015    Patient Active Problem List   Diagnosis Date Noted  . Acute bacterial rhinosinusitis 11/24/2020  . Encounter for colorectal cancer screening 11/19/2019  . Weight gain 11/19/2019  . Sleep disturbance 11/19/2019  . Visit for gynecologic examination 11/19/2019  . Screening for colorectal cancer 02/13/2019  . Encounter for gynecological examination with Papanicolaou smear of cervix 02/13/2019  . Fibroids 02/13/2019  . Encounter for surveillance of contraceptive pills 08/12/2017  . Encounter for well woman exam with routine gynecological exam 08/12/2017  . ANA positive 11/06/2016  . Fibromyalgia 11/06/2016  . Menorrhagia with regular cycle 12/20/2015  . Dysmenorrhea 12/20/2015  . Uterine leiomyoma 12/20/2015  . Vulvar itching 12/20/2015  . Anemia 12/20/2015  . Essential hypertension 01/12/2013  . Esophageal reflux 01/12/2013  . Rheumatoid arthritis ([place]) 01/12/2013  . Rash and nonspecific skin eruption 01/12/2013    Past Surgical  History:  Procedure Laterality Date  . ESOPHAGOGASTRODUODENOSCOPY ENDOSCOPY    . IR GENERIC HISTORICAL  02/07/2016   IR RADIOLOGIST EVAL & MGMT 02/07/2016 [name], MD [facility] INTERV RAD  . WISDOM TOOTH EXTRACTION      OB History    Gravida  1   Para      Term      Preterm      AB  1   Living        SAB      IAB      Ectopic      Multiple      Live Births               Home Medications    Prior to Admission medications   Medication Sig Start Date End Date Taking? Authorizing Provider  acetaminophen (TYLENOL) 500 MG tablet Take 1,000 mg by mouth as needed.    [provider]  amLODipine (NORVASC) 5 MG tablet TAKE 1 TABLET BY MOUTH DAILY. FOR BLOOD PRESSURE 08/08/20 08/08/21  Elvia Collum M, DO  amoxicillin-clavulanate (AUGMENTIN) 875-125 MG tablet Take 1 tablet by mouth 2 (two) times daily. 11/24/20   Chalmers Guest, NP  cetirizine (ZYRTEC) 10 MG tablet Take 10 mg by mouth daily.    [provider]  diphenhydrAMINE (BENADRYL) 25 MG tablet Take 25 mg by mouth every 6 (six) hours as needed.    [provider]  docusate sodium (COLACE) 100 MG capsule Take 100 mg  by mouth daily.     [provider]  DULoxetine (CYMBALTA) 60 MG capsule TAKE 1 CAPSULE BY MOUTH ONCE DAILY 09/23/20 09/23/21  Bo Merino, MD  Etanercept (ENBREL MINI) 50 MG/ML SOCT INJECT 50 MG INTO THE SKIN ONCE A WEEK. 11/17/20 11/17/21  Tresa Garter, MD  hydrochlorothiazide (HYDRODIURIL) 12.5 MG tablet TAKE 1 TABLET BY MOUTH DAILY. 08/08/20 08/08/21  Elvia Collum M, DO  ibuprofen (ADVIL,MOTRIN) 200 MG tablet Take 200 mg by mouth as needed.    [provider]  losartan (COZAAR) 50 MG tablet TAKE 1 TABLET BY MOUTH DAILY. 08/08/20 08/08/21  Erven Colla, DO  Melatonin 5 MG CAPS Take by mouth at bedtime.    [provider]  Multiple Vitamins-Minerals (MULTIVITAMIN WOMEN PO) Take by mouth daily.    [provider]  Norethindrone  Acetate-Ethinyl Estrad-FE (LOESTRIN 24 FE) 1-20 MG-MCG(24) tablet TAKE 1 TABLET BY MOUTH ONCE DAILY (USE NDC [address]-76) 11/21/20 11/21/21  Estill Dooms, NP  traZODone (DESYREL) 50 MG tablet Take 1 tablet (50 mg total) by mouth at bedtime. Patient taking differently: Take 25 mg by mouth at bedtime as needed. 12/25/19   Ofilia Neas, PA-C  zolpidem (AMBIEN) 5 MG tablet Take 1 tablet (5 mg total) by mouth at bedtime as needed for sleep. 11/19/19   Estill Dooms, NP    Family History Family History  Problem Relation Age of Onset  . Diabetes Mother   . Hypertension Mother   . Asthma Mother   . Cancer Mother        breast  . Thyroid disease Mother   . Arthritis Mother   . Graves' disease Mother   . Cancer Maternal Grandmother        pancreatic  . Fibroids Sister   . COPD Maternal Aunt   . Asthma Maternal Aunt   . Gout Maternal Aunt     Social History Social History   Tobacco Use  . Smoking status: Never Smoker  . Smokeless tobacco: Never Used  Vaping Use  . Vaping Use: Never used  Substance Use Topics  . Alcohol use: No  . Drug use: Never     Allergies   Ceftin [cefuroxime axetil], Humira [adalimumab], and Methotrexate derivatives   Review of Systems Review of Systems Pertinent negatives listed in HPI   Physical Exam Triage Vital Signs ED Triage Vitals  Enc Vitals Group     BP 12/04/20 1209 122/86     Pulse Rate 12/04/20 1209 96     Resp 12/04/20 1209 18     Temp 12/04/20 1209 98.8 F (37.1 C)     Temp Source 12/04/20 1209 Oral     SpO2 12/04/20 1209 97 %     Weight --      Height --      Head Circumference --      Peak Flow --      Pain Score 12/04/20 1207 3     Pain Loc --      Pain Edu? --      Excl. in [place]? --    No data found.  Updated Vital Signs BP 122/86 (BP Location: Right Arm)   Pulse 96   Temp 98.8 F (37.1 C) (Oral)   Resp 18   SpO2 97%   Visual Acuity Right Eye Distance:   Left Eye Distance:   Bilateral Distance:     Right Eye Near:   Left Eye Near:    Bilateral Near:  Physical Exam  General Appearance:    Alert, cooperative, no distress  HENT:   Normocephalic, ears normal, nares mucosal edema with congestion, rhinorrhea, oropharynx non erythematous without exudates   Eyes:    PERRL, conjunctiva/corneas clear, EOM's intact       Lungs:     Clear to auscultation bilaterally, respirations unlabored  Heart:    Regular rate and rhythm  Neurologic:   Awake, alert, oriented x 3. No apparent focal neurological           defect.      UC Treatments / Results  Labs (all labs ordered are listed, but only abnormal results are displayed) Labs Reviewed - No data to display  EKG   Radiology No results found.  Procedures Procedures (including critical care time)  Medications Ordered in UC Medications - No data to display  Initial Impression / Assessment and Plan / UC Course  I have reviewed the triage vital signs and the nursing notes.  Pertinent labs & imaging results that were available during my care of the patient were reviewed by me and considered in my medical decision making (see chart for details).    Persistent nasal congestion with rhinorrhea and sore throat we will treat with a course of prednisone 20 mg once daily for 5 days to relieve persistent nasal congestion and inflammation postnasal drainage subsequently resolved no sore throat.  Promethazine DM for cough and is also beneficial for congestion.  Advised to resume Nasacort and continue cetirizine for management of seasonal allergies. Final Clinical Impressions(s) / UC Diagnoses   Final diagnoses:  Nasal congestion with rhinorrhea  Sore throat     Discharge Instructions     Resume Nasocort and continue allergy medication. Take prednisone as prescribed and complete entire course of antibiotics.    ED Prescriptions    Medication Sig Dispense Auth. Provider   predniSONE (DELTASONE) 20 MG tablet Take 1 tablet (20 mg  total) by mouth daily with breakfast for 5 days. 5 tablet Scot Jun, FNP   promethazine-dextromethorphan (PROMETHAZINE-DM) 6.25-15 MG/5ML syrup Take 5 mLs by mouth 4 (four) times daily as needed for cough. 140 mL Scot Jun, FNP     PDMP not reviewed this encounter.   Scot Jun, FNP 12/04/20 1310

## 2020-12-04 NOTE — ED Triage Notes (Signed)
Sore throat x 11 days. Seen by pcp and told she was having sinus problems.  Was prescribed augmentin, pt is still taking abx.

## 2020-12-07 ENCOUNTER — Other Ambulatory Visit: Payer: Self-pay

## 2020-12-07 MED ORDER — CARESTART COVID-19 HOME TEST VI KIT
PACK | 0 refills | Status: DC
  Filled 2020-12-07: qty 1, 2d supply, fill #0

## 2020-12-11 MED FILL — Losartan Potassium Tab 50 MG: ORAL | 60 days supply | Qty: 60 | Fill #0 | Status: AC

## 2020-12-12 ENCOUNTER — Other Ambulatory Visit: Payer: Self-pay

## 2020-12-12 DIAGNOSIS — J301 Allergic rhinitis due to pollen: Secondary | ICD-10-CM | POA: Diagnosis not present

## 2020-12-12 DIAGNOSIS — R070 Pain in throat: Secondary | ICD-10-CM | POA: Diagnosis not present

## 2020-12-12 MED ORDER — TRIAMCINOLONE ACETONIDE 55 MCG/ACT NA AERO
INHALATION_SPRAY | NASAL | 12 refills | Status: DC
  Filled 2020-12-12: qty 16.9, 30d supply, fill #0

## 2020-12-12 MED ORDER — AZELASTINE HCL 0.1 % NA SOLN
NASAL | 12 refills | Status: DC
  Filled 2020-12-12: qty 30, 30d supply, fill #0

## 2020-12-15 ENCOUNTER — Other Ambulatory Visit (HOSPITAL_COMMUNITY): Payer: Self-pay

## 2020-12-21 ENCOUNTER — Other Ambulatory Visit (HOSPITAL_COMMUNITY): Payer: Self-pay

## 2020-12-22 ENCOUNTER — Other Ambulatory Visit: Payer: Self-pay | Admitting: Rheumatology

## 2020-12-23 ENCOUNTER — Other Ambulatory Visit: Payer: Self-pay

## 2020-12-23 MED ORDER — DULOXETINE HCL 60 MG PO CPEP
ORAL_CAPSULE | Freq: Every day | ORAL | 2 refills | Status: DC
  Filled 2020-12-23: qty 30, 30d supply, fill #0
  Filled 2021-01-22: qty 30, 30d supply, fill #1

## 2020-12-23 NOTE — Telephone Encounter (Signed)
Next Visit: 02/20/2021  Last Visit: 09/19/2020  Last Fill: 09/23/2020  Dx: Fibromyalgia  Current Dose per office note on 09/19/2020, Cymbalta 60 mg p.o. daily  Okay to refill Cymbalta?

## 2020-12-27 ENCOUNTER — Other Ambulatory Visit (HOSPITAL_COMMUNITY): Payer: Self-pay

## 2021-01-02 ENCOUNTER — Ambulatory Visit (INDEPENDENT_AMBULATORY_CARE_PROVIDER_SITE_OTHER): Payer: 59 | Admitting: Adult Health

## 2021-01-02 ENCOUNTER — Other Ambulatory Visit: Payer: Self-pay

## 2021-01-02 ENCOUNTER — Encounter: Payer: Self-pay | Admitting: Adult Health

## 2021-01-02 VITALS — BP 124/86 | HR 96 | Ht 65.5 in | Wt 207.0 lb

## 2021-01-02 DIAGNOSIS — D219 Benign neoplasm of connective and other soft tissue, unspecified: Secondary | ICD-10-CM

## 2021-01-02 DIAGNOSIS — Z3041 Encounter for surveillance of contraceptive pills: Secondary | ICD-10-CM

## 2021-01-02 DIAGNOSIS — Z1231 Encounter for screening mammogram for malignant neoplasm of breast: Secondary | ICD-10-CM

## 2021-01-02 DIAGNOSIS — Z1211 Encounter for screening for malignant neoplasm of colon: Secondary | ICD-10-CM | POA: Diagnosis not present

## 2021-01-02 DIAGNOSIS — Z01419 Encounter for gynecological examination (general) (routine) without abnormal findings: Secondary | ICD-10-CM | POA: Diagnosis not present

## 2021-01-02 LAB — HEMOCCULT GUIAC POC 1CARD (OFFICE): Fecal Occult Blood, POC: NEGATIVE

## 2021-01-02 MED ORDER — NORETHIN ACE-ETH ESTRAD-FE 1-20 MG-MCG(24) PO TABS
ORAL_TABLET | ORAL | 4 refills | Status: DC
  Filled 2021-01-02 – 2021-02-15 (×2): qty 84, 84d supply, fill #0
  Filled 2021-05-25: qty 84, 84d supply, fill #1
  Filled 2021-09-29: qty 84, 84d supply, fill #2
  Filled 2021-12-19 – 2021-12-20 (×3): qty 84, 84d supply, fill #3

## 2021-01-02 NOTE — Progress Notes (Signed)
Patient ID: Karen Burton, female   DOB: 03/12/1976, 45 y.o.   MRN: [id number] History of Present Illness:  Karen Burton is a 45 year old black female,single, G1P0010, in for a well woman gyn exam. Lab Results  Component Value Date   DIAGPAP  02/13/2019    NEGATIVE FOR INTRAEPITHELIAL LESIONS OR MALIGNANCY.   HPV NOT Detected 02/13/2019  PCP is Dr Lovena Le  Current Medications, Allergies, Past Medical History, Past Surgical History, Family History and Social History were reviewed in [facility] record.     Review of Systems: Patient denies any headaches, hearing loss, fatigue, blurred vision, shortness of breath, chest pain, abdominal pain, problems with bowel movements,has some constipation, urination, or intercourse.(not active) No joint pain or mood swings.    Physical Exam:BP 124/86 (BP Location: Right Arm, Patient Position: Sitting, Cuff Size: Normal)   Pulse 96   Ht 5' 5.5" (1.664 m)   Wt 207 lb (93.9 kg)   LMP 12/18/2020   BMI 33.92 kg/m  General:  Well developed, well nourished, no acute distress Skin:  Warm and dry Neck:  Midline trachea, normal thyroid, good ROM, no lymphadenopathy Lungs; Clear to auscultation bilaterally Breast:  No dominant palpable mass, retraction, or nipple discharge Cardiovascular: Regular rate and rhythm Abdomen:  Soft, non tender, no hepatosplenomegaly Pelvic:  External genitalia is normal in appearance, no lesions.  The vagina is normal in appearance. Urethra has no lesions or masses. The cervix is smooth. Uterus is felt to be about 16 weeks in size, has fibroids.  No adnexal masses or tenderness noted.Bladder is non tender, no masses felt. Rectal: Good sphincter tone, no polyps, or hemorrhoids felt.  Hemoccult negative. Extremities/musculoskeletal:  No swelling or varicosities noted, no clubbing or cyanosis Psych:  No mood changes, alert and cooperative,seems happy AA is 0 Fall risk is low  PHQ 9 score is 6 GAD 7  score is 0  Upstream - 01/02/21 1121      Pregnancy Intention Screening   Does the patient want to become pregnant in the next year? No    Does the patient's partner want to become pregnant in the next year? No    Would the patient like to discuss contraceptive options today? No      Contraception Wrap Up   Current Method Oral Contraceptive    End Method Oral Contraceptive    Contraception Counseling Provided No         Examination chaperoned by Diona Fanti CMA  Impression and Plan: 1. Encounter for well woman exam with routine gynecological exam Pap and physical in 1 year  2. Encounter for screening fecal occult blood testing  3. Fibroids   4. Encounter for surveillance of contraceptive pills Meds ordered this encounter  Medications  . Norethindrone Acetate-Ethinyl Estrad-FE (LOESTRIN 24 FE) 1-20 MG-MCG(24) tablet    Sig: TAKE 1 TABLET BY MOUTH ONCE DAILY (USE NDC [phone])    Dispense:  84 tablet    Refill:  4    Order Specific Question:   Supervising Provider    Answer:   Elonda Husky, LUTHER H [[id number]]    5. Screening mammogram for breast cancer Pt to call for appt, order is in for [facility]

## 2021-01-16 ENCOUNTER — Ambulatory Visit (HOSPITAL_COMMUNITY)
Admission: RE | Admit: 2021-01-16 | Discharge: 2021-01-16 | Disposition: A | Discharge status: Home / Self Care | Payer: 59 | Source: Ambulatory Visit | Attending: Adult Health | Admitting: Adult Health

## 2021-01-16 ENCOUNTER — Other Ambulatory Visit: Payer: Self-pay

## 2021-01-16 DIAGNOSIS — Z1231 Encounter for screening mammogram for malignant neoplasm of breast: Secondary | ICD-10-CM | POA: Insufficient documentation

## 2021-01-18 ENCOUNTER — Other Ambulatory Visit (HOSPITAL_COMMUNITY): Payer: Self-pay

## 2021-01-23 ENCOUNTER — Other Ambulatory Visit: Payer: Self-pay

## 2021-01-25 ENCOUNTER — Other Ambulatory Visit (HOSPITAL_COMMUNITY): Payer: Self-pay

## 2021-02-06 NOTE — Progress Notes (Signed)
Office Visit Note  Patient: Karen Burton             Date of Birth: 1975/08/10           MRN: [id number]             PCP: Erven Colla, DO Referring: Erven Colla, DO Visit Date: 02/20/2021 Occupation: @GUAROCC @  Subjective:  Trapezius muscle tension and tenderness bilaterally   History of Present Illness: Karen Burton is a 45 y.o. female with history of seropositive rheumatoid arthritis and ostearthritis. She is on enbrel 50 mg sq injections once weekly.  She has not missed any doses recently.  She denies any recent rheumatoid arthritis flares. She denies any joint swelling at this time. Her left shoulder joint discomfort has resolved. She denies any rashes or injection site reactions.  She states she intermittent muscle cramping in her feet and muscle tension in bilateral trapezius muscles from underlying fibromyalgia.  She has been under a lot of stress at work which has been contributing to some of her discomfort secondary to fibromyalgia. She continues to have interrupted sleep at night. She has been taking trazodone 50 mg 1/2 tablet at bedtime for insomnia.  She takes [place] very sparingly on the weekends if she has not slept well for several nights in a row.  She remains on cymbalta as prescribed.       Activities of Daily Living:  Patient reports morning stiffness for 30-60 minutes.   Patient Reports nocturnal pain.  Difficulty dressing/grooming: Denies Difficulty climbing stairs: Denies Difficulty getting out of chair: Reports Difficulty using hands for taps, buttons, cutlery, and/or writing: Denies  Review of Systems  Constitutional:  Positive for fatigue.  HENT:  Positive for mouth dryness and nose dryness. Negative for mouth sores.        Nose sores   Eyes:  Positive for dryness. Negative for pain and itching.  Respiratory:  Negative for shortness of breath and difficulty breathing.   Cardiovascular:  Negative for chest pain and  palpitations.  Gastrointestinal:  Positive for constipation. Negative for blood in stool and diarrhea.  Endocrine: Negative for increased urination.  Genitourinary:  Negative for difficulty urinating.  Musculoskeletal:  Positive for joint pain, joint pain and morning stiffness. Negative for joint swelling, myalgias, muscle tenderness and myalgias.  Skin:  Negative for color change, rash and redness.  Allergic/Immunologic: Negative for susceptible to infections.  Neurological:  Positive for numbness and headaches. Negative for dizziness, memory loss and weakness.  Hematological:  Positive for bruising/bleeding tendency.  Psychiatric/Behavioral:  Negative for confusion.    PMFS History:  Patient Active Problem List   Diagnosis Date Noted   Encounter for screening fecal occult blood testing 01/02/2021   Acute bacterial rhinosinusitis 11/24/2020   Encounter for colorectal cancer screening 11/19/2019   Weight gain 11/19/2019   Sleep disturbance 11/19/2019   Visit for gynecologic examination 11/19/2019   Screening for colorectal cancer 02/13/2019   Encounter for gynecological examination with Papanicolaou smear of cervix 02/13/2019   Fibroids 02/13/2019   Encounter for surveillance of contraceptive pills 08/12/2017   Encounter for well woman exam with routine gynecological exam 08/12/2017   ANA positive 11/06/2016   Fibromyalgia 11/06/2016   Menorrhagia with regular cycle 12/20/2015   Dysmenorrhea 12/20/2015   Uterine leiomyoma 12/20/2015   Vulvar itching 12/20/2015   Anemia 12/20/2015   Essential hypertension 01/12/2013   Esophageal reflux 01/12/2013   Rheumatoid arthritis ([address]) 01/12/2013   Rash and nonspecific skin eruption  01/12/2013    Past Medical History:  Diagnosis Date   Anemia    Arthritis    Dysmenorrhea 12/20/2015   Eczema    mild   Fibroids 12/20/2015   Fibromyalgia    Menorrhagia with regular cycle 12/20/2015   Rash    Reflux    Rhinitis    chonic   Vitamin D  deficiency    Vulvar itching 12/20/2015    Family History  Problem Relation Age of Onset   Diabetes Mother    Hypertension Mother    Asthma Mother    Cancer Mother        breast   Thyroid disease Mother    Arthritis Mother    Berenice Primas' disease Mother    Cancer Maternal Grandmother        pancreatic   Fibroids Sister    COPD Maternal Aunt    Asthma Maternal Aunt    Gout Maternal Aunt    Past Surgical History:  Procedure Laterality Date   ESOPHAGOGASTRODUODENOSCOPY ENDOSCOPY     IR GENERIC HISTORICAL  02/07/2016   IR RADIOLOGIST EVAL & MGMT 02/07/2016 [name], MD [facility] INTERV RAD   WISDOM TOOTH EXTRACTION     Social History   Social History Narrative   Not on file   Immunization History  Administered Date(s) Administered   DT (Pediatric) 02/20/2013   Influenza-Unspecified 07/18/2009, 05/16/2016, 06/03/2017, 04/07/2018   PFIZER(Purple Top)SARS-COV-2 Vaccination 04/01/2020, 04/22/2020   Pneumococcal Polysaccharide-23 05/08/2007     Objective: Vital Signs: BP (!) 139/94 (BP Location: Left Arm, Patient Position: Sitting, Cuff Size: Normal)   Pulse 98   Ht 5\' 5"  (1.651 m)   Wt 206 lb 9.6 oz (93.7 kg)   BMI 34.38 kg/m    Physical Exam Vitals and nursing note reviewed.  Constitutional:      Appearance: She is well-developed.  HENT:     Head: Normocephalic and atraumatic.  Eyes:     Conjunctiva/sclera: Conjunctivae normal.  Pulmonary:     Effort: Pulmonary effort is normal.  Abdominal:     Palpations: Abdomen is soft.  Musculoskeletal:     Cervical back: Normal range of motion.  Skin:    General: Skin is warm and dry.     Capillary Refill: Capillary refill takes less than 2 seconds.  Neurological:     Mental Status: She is alert and oriented to person, place, and time.  Psychiatric:        Behavior: Behavior normal.     Musculoskeletal Exam: C-spine has good ROM with no discomfort.  Left trapezius muscle tension and tenderness.  Shoulder joints, elbow  joints, wrist joints, MCPs, PIPs, and DIPs good ROM with no synovitis.  Complete fist formation bilaterally.  Hip joints, knee joints, and ankle joints good ROM with discomfort.  No warmth or effusion of knee joints noted.  No tenderness or swelling of ankle joints.    CDAI Exam: CDAI Score: 0.8  Patient Global: 4 mm; Provider Global: 4 mm Swollen: 0 ; Tender: 0  Joint Exam 02/20/2021   No joint exam has been documented for this visit   There is currently no information documented on the homunculus. Go to the Rheumatology activity and complete the homunculus joint exam.  Investigation: No additional findings.  Imaging: No results found.  Recent Labs: Lab Results  Component Value Date   WBC 4.3 02/09/2021   HGB 13.2 02/09/2021   PLT 275 02/09/2021   NA 141 02/09/2021   K 4.1 02/09/2021   CL  101 02/09/2021   CO2 25 02/09/2021   GLUCOSE 100 (H) 02/09/2021   BUN 8 02/09/2021   CREATININE 0.85 02/09/2021   BILITOT 0.2 02/09/2021   ALKPHOS 88 02/09/2021   AST 23 02/09/2021   ALT 24 02/09/2021   PROT 7.0 02/09/2021   ALBUMIN 4.1 02/09/2021   CALCIUM 8.9 02/09/2021   GFRAA 92 06/10/2020   QFTBGOLDPLUS NEGATIVE 10/23/2019    Speciality Comments: Prior therapy includes: methotrexate and Plaquenil (neutropenia) and Humira (painful injections)  Procedures:  No procedures performed Allergies: Ceftin [cefuroxime axetil], Humira [adalimumab], and Methotrexate derivatives   Assessment / Plan:     Visit Diagnoses: Rheumatoid arthritis involving multiple sites with positive rheumatoid factor ([place]): She has no joint tenderness or synovitis on examination today.  She has not had any recent rheumatoid arthritis flares.  She is clinically doing well on Enbrel 50 mg subcutaneous injections once weekly.  She has not missed any doses of Enbrel and has been tolerating it without any side effects or injection site reactions.  She has not noticed any joint swelling recently and has no  inflammation on examination today.  Her left shoulder joint discomfort has resolved.  Her rheumatoid arthritis seems well controlled on the current treatment regimen.  She will remain on Enbrel as monotherapy.  She was advised to notify us if she develops increased joint pain or joint swelling.  She will follow-up in the office in 5 months.  High risk medication use - Enbrel 50 mg sq injection once weekly. CBC and CMP updated on 02/09/21. Her next lab work will be due in October and every 3 months.  Standing orders for CBC and CMP are in place.   TB gold negative on 06/10/20.   She has not had any recent infections.  Discussed the importance of holding enbrel if she develops signs or symptoms of an infection and to resume once the infection has completely cleared.   Fibromyalgia - She experiences intermittent myalgias and muscle tenderness due to underlying fibromyalgia.  On examination today she has left trapezius muscle tension and tenderness.  She declined a trigger point injection today but was advised to notify us if her symptoms persist or worsen.  She was strongly encouraged to consider massage.  She continues to take Cymbalta 60 mg 1 capsule by mouth daily and finds it to be effective at managing her symptoms.  She has ongoing fatigue secondary to insomnia.  She continues to have interrupted sleep at night despite taking trazodone 50 mg half tablet by mouth at bedtime during the week for insomnia.  If she is having a flare or has not slept in several days she will take Ambien on the weekends for insomnia.  Refills of trazodone and Cymbalta were sent to the pharmacy. She has been having some increased muscle cramps at night especially in her left leg.  We discussed the use of magnesium malate to try to alleviate her symptoms.  She was advised to start on the lowest dose possible and increase as tolerated. We discussed the importance of stress management.  She was strongly encouraged to increase her  exercise regimen and to practice good sleep hygiene.  Other fatigue: Chronic and secondary to insomnia.  We discussed the importance of regular exercise.  She has started to walk on a regular basis to increase her exercise regimen.  Other medical conditions are listed as follows:   History of hypertension: BP was 134/94 today in the office.   History of insomnia: She  takes trazodone 50 mg half tablet (25 mg total) at bedtime for insomnia.  She only takes [place] every sparingly on the weekend if she has not slept well for several nights.  We discussed the importance of good sleep hygiene.  History of gastroesophageal reflux (GERD)  Orders: No orders of the defined types were placed in this encounter.  Meds ordered this encounter  Medications   DULoxetine (CYMBALTA) 60 MG capsule    Sig: TAKE 1 CAPSULE BY MOUTH ONCE DAILY    Dispense:  30 capsule    Refill:  2   traZODone (DESYREL) 50 MG tablet    Sig: Take 0.5 tablets (25 mg total) by mouth at bedtime as needed.    Dispense:  30 tablet    Refill:  1     Follow-Up Instructions: Return in about 5 months (around 07/23/2021) for Rheumatoid arthritis, Osteoarthritis.   Ofilia Neas, PA-C  Note - This record has been created using Dragon software.  Chart creation errors have been sought, but may not always  have been located. Such creation errors do not reflect on  the standard of medical care.

## 2021-02-09 DIAGNOSIS — I10 Essential (primary) hypertension: Secondary | ICD-10-CM | POA: Diagnosis not present

## 2021-02-10 ENCOUNTER — Other Ambulatory Visit: Payer: Self-pay

## 2021-02-10 ENCOUNTER — Other Ambulatory Visit: Payer: Self-pay | Admitting: Family Medicine

## 2021-02-10 ENCOUNTER — Telehealth: Payer: Self-pay | Admitting: Family Medicine

## 2021-02-10 DIAGNOSIS — I10 Essential (primary) hypertension: Secondary | ICD-10-CM

## 2021-02-10 LAB — CBC
Hematocrit: 39.9 % (ref 34.0–46.6)
Hemoglobin: 13.2 g/dL (ref 11.1–15.9)
MCH: 29.3 pg (ref 26.6–33.0)
MCHC: 33.1 g/dL (ref 31.5–35.7)
MCV: 89 fL (ref 79–97)
Platelets: 275 10*3/uL (ref 150–450)
RBC: 4.5 x10E6/uL (ref 3.77–5.28)
RDW: 12.9 % (ref 11.7–15.4)
WBC: 4.3 10*3/uL (ref 3.4–10.8)

## 2021-02-10 LAB — CMP14+EGFR
ALT: 24 IU/L (ref 0–32)
AST: 23 IU/L (ref 0–40)
Albumin/Globulin Ratio: 1.4 (ref 1.2–2.2)
Albumin: 4.1 g/dL (ref 3.8–4.8)
Alkaline Phosphatase: 88 IU/L (ref 44–121)
BUN/Creatinine Ratio: 9 (ref 9–23)
BUN: 8 mg/dL (ref 6–24)
Bilirubin Total: 0.2 mg/dL (ref 0.0–1.2)
CO2: 25 mmol/L (ref 20–29)
Calcium: 8.9 mg/dL (ref 8.7–10.2)
Chloride: 101 mmol/L (ref 96–106)
Creatinine, Ser: 0.85 mg/dL (ref 0.57–1.00)
Globulin, Total: 2.9 g/dL (ref 1.5–4.5)
Glucose: 100 mg/dL — ABNORMAL HIGH (ref 65–99)
Potassium: 4.1 mmol/L (ref 3.5–5.2)
Sodium: 141 mmol/L (ref 134–144)
Total Protein: 7 g/dL (ref 6.0–8.5)
eGFR: 86 mL/min/{1.73_m2} (ref >59)

## 2021-02-10 LAB — LIPID PANEL
Chol/HDL Ratio: 3.1 ratio (ref 0.0–4.4)
Cholesterol, Total: 135 mg/dL (ref 100–199)
HDL: 44 mg/dL (ref >39)
LDL Chol Calc (NIH): 74 mg/dL (ref 0–99)
Triglycerides: 91 mg/dL (ref 0–149)
VLDL Cholesterol Cal: 17 mg/dL (ref 5–40)

## 2021-02-10 MED ORDER — LOSARTAN POTASSIUM 50 MG PO TABS
ORAL_TABLET | Freq: Every day | ORAL | 0 refills | Status: DC
  Filled 2021-02-10: qty 90, 90d supply, fill #0

## 2021-02-10 NOTE — Telephone Encounter (Signed)
Called pt to see if she wanted refill at local pharm since its after 3 when I saw message and she said pharm gave her enough til tues and her appt is monday

## 2021-02-10 NOTE — Telephone Encounter (Signed)
Pt is needing a refill request on   losartan (COZAAR) 50 MG tablet  Karen Burton  Pt states that she will be leaving work at 3 and needs the medication to last her until her app Monday because she is totally out

## 2021-02-13 ENCOUNTER — Other Ambulatory Visit: Payer: Self-pay

## 2021-02-13 ENCOUNTER — Encounter: Payer: Self-pay | Admitting: Family Medicine

## 2021-02-13 ENCOUNTER — Ambulatory Visit (INDEPENDENT_AMBULATORY_CARE_PROVIDER_SITE_OTHER): Payer: 59 | Admitting: Family Medicine

## 2021-02-13 DIAGNOSIS — I10 Essential (primary) hypertension: Secondary | ICD-10-CM | POA: Diagnosis not present

## 2021-02-13 MED ORDER — AMLODIPINE BESYLATE 5 MG PO TABS
ORAL_TABLET | Freq: Every day | ORAL | 1 refills | Status: DC
  Filled 2021-02-13: qty 90, 90d supply, fill #0
  Filled 2021-06-02: qty 90, 90d supply, fill #1

## 2021-02-13 MED ORDER — HYDROCHLOROTHIAZIDE 12.5 MG PO TABS
ORAL_TABLET | Freq: Every day | ORAL | 1 refills | Status: DC
  Filled 2021-02-13: qty 90, 90d supply, fill #0
  Filled 2021-06-02: qty 90, 90d supply, fill #1

## 2021-02-13 MED ORDER — LOSARTAN POTASSIUM 50 MG PO TABS
ORAL_TABLET | Freq: Every day | ORAL | 1 refills | Status: DC
  Filled 2021-02-13: qty 90, 90d supply, fill #0

## 2021-02-13 NOTE — Progress Notes (Signed)
Patient ID: Karen Burton, female    DOB: October 16, 1975, 45 y.o.   MRN: [id number]   Chief Complaint  Patient presents with   Hypertension   Subjective:    HPI  Patient here for follow up on HTN. Pt states BP has been doing well.    Medical History Yun has a past medical history of Anemia, Arthritis, Dysmenorrhea (12/20/2015), Eczema, Fibroids (12/20/2015), Fibromyalgia, Menorrhagia with regular cycle (12/20/2015), Rash, Reflux, Rhinitis, Vitamin D deficiency, and Vulvar itching (12/20/2015).   Outpatient Encounter Medications as of 02/13/2021  Medication Sig   acetaminophen (TYLENOL) 500 MG tablet Take 1,000 mg by mouth as needed.   cetirizine (ZYRTEC) 10 MG tablet Take 10 mg by mouth daily.   diphenhydrAMINE (BENADRYL) 25 MG tablet Take 25 mg by mouth every 6 (six) hours as needed.   docusate sodium (COLACE) 100 MG capsule Take 100 mg by mouth daily.    ibuprofen (ADVIL,MOTRIN) 200 MG tablet Take 200 mg by mouth as needed.   Melatonin 5 MG CAPS Take by mouth at bedtime.   Multiple Vitamins-Minerals (MULTIVITAMIN WOMEN PO) Take by mouth daily.   Norethindrone Acetate-Ethinyl Estrad-FE (LOESTRIN 24 FE) 1-20 MG-MCG(24) tablet TAKE 1 TABLET BY MOUTH ONCE DAILY (USE NDC [phone])   triamcinolone (NASACORT) 55 MCG/ACT AERO nasal inhaler use 2 sprays each nostril daily (Patient taking differently: Place into the nose as needed.)   zolpidem (AMBIEN) 5 MG tablet Take 1 tablet (5 mg total) by mouth at bedtime as needed for sleep.   [DISCONTINUED] amLODipine (NORVASC) 5 MG tablet TAKE 1 TABLET BY MOUTH DAILY. FOR BLOOD PRESSURE   [DISCONTINUED] COVID-19 At Home Antigen Test (CARESTART COVID-19 HOME TEST) KIT USE AS DIRECTED WITHIN PACKAGE INSTRUCTIONS (Patient not taking: Reported on 02/20/2021)   [DISCONTINUED] DULoxetine (CYMBALTA) 60 MG capsule TAKE 1 CAPSULE BY MOUTH ONCE DAILY   [DISCONTINUED] Etanercept (ENBREL MINI) 50 MG/ML SOCT INJECT 50 MG INTO THE SKIN ONCE A WEEK.    [DISCONTINUED] hydrochlorothiazide (HYDRODIURIL) 12.5 MG tablet TAKE 1 TABLET BY MOUTH DAILY.   [DISCONTINUED] losartan (COZAAR) 50 MG tablet TAKE 1 TABLET BY MOUTH DAILY.   [DISCONTINUED] promethazine-dextromethorphan (PROMETHAZINE-DM) 6.25-15 MG/5ML syrup Take 5 mLs by mouth 4 (four) times daily as needed for cough.   [DISCONTINUED] traZODone (DESYREL) 50 MG tablet Take 1 tablet (50 mg total) by mouth at bedtime. (Patient taking differently: Take 25 mg by mouth at bedtime as needed.)   amLODipine (NORVASC) 5 MG tablet TAKE 1 TABLET BY MOUTH DAILY. FOR BLOOD PRESSURE   hydrochlorothiazide (HYDRODIURIL) 12.5 MG tablet TAKE 1 TABLET BY MOUTH DAILY.   losartan (COZAAR) 50 MG tablet TAKE 1 TABLET BY MOUTH DAILY.   [DISCONTINUED] losartan (COZAAR) 50 MG tablet TAKE 1 TABLET BY MOUTH DAILY. (Patient not taking: Reported on 02/20/2021)   No facility-administered encounter medications on file as of 02/13/2021.     Review of Systems  Constitutional:  Negative for chills and fever.  HENT:  Negative for congestion, rhinorrhea and sore throat.   Respiratory:  Negative for cough, shortness of breath and wheezing.   Cardiovascular:  Negative for chest pain and leg swelling.  Gastrointestinal:  Negative for abdominal pain, diarrhea, nausea and vomiting.  Genitourinary:  Negative for dysuria and frequency.  Musculoskeletal:  Negative for arthralgias and back pain.  Skin:  Negative for rash.  Neurological:  Negative for dizziness, weakness and headaches.    Vitals BP 127/88   Pulse (!) 117   Temp (!) 97.3 F (36.3 C)  Wt 206 lb 6.4 oz (93.6 kg)   SpO2 97%   BMI 33.82 kg/m   Objective:   Physical Exam Vitals and nursing note reviewed.  Constitutional:      Appearance: Normal appearance.  HENT:     Head: Normocephalic and atraumatic.  Eyes:     Extraocular Movements: Extraocular movements intact.     Conjunctiva/sclera: Conjunctivae normal.     Pupils: Pupils are equal, round, and  reactive to light.  Cardiovascular:     Rate and Rhythm: Regular rhythm. Tachycardia present.     Pulses: Normal pulses.     Heart sounds: Normal heart sounds.  Pulmonary:     Effort: Pulmonary effort is normal.     Breath sounds: Normal breath sounds. No wheezing, rhonchi or rales.  Musculoskeletal:        General: Normal range of motion.     Right lower leg: No edema.     Left lower leg: No edema.  Skin:    General: Skin is warm and dry.     Findings: No lesion or rash.  Neurological:     General: No focal deficit present.     Mental Status: She is alert and oriented to person, place, and time.  Psychiatric:        Mood and Affect: Mood normal.        Behavior: Behavior normal.     Assessment and Plan   1. Essential hypertension - amLODipine (NORVASC) 5 MG tablet; TAKE 1 TABLET BY MOUTH DAILY. FOR BLOOD PRESSURE  Dispense: 90 tablet; Refill: 1 - hydrochlorothiazide (HYDRODIURIL) 12.5 MG tablet; TAKE 1 TABLET BY MOUTH DAILY.  Dispense: 90 tablet; Refill: 1 - losartan (COZAAR) 50 MG tablet; TAKE 1 TABLET BY MOUTH DAILY.  Dispense: 90 tablet; Refill: 1   HTN- Stable.  Cotn to monitor.    Return in about 6 months (around 08/16/2021) for f/u htn.    

## 2021-02-14 ENCOUNTER — Other Ambulatory Visit: Payer: Self-pay

## 2021-02-14 ENCOUNTER — Other Ambulatory Visit (HOSPITAL_COMMUNITY): Payer: Self-pay

## 2021-02-15 ENCOUNTER — Other Ambulatory Visit: Payer: Self-pay

## 2021-02-16 ENCOUNTER — Other Ambulatory Visit: Payer: Self-pay | Admitting: Physician Assistant

## 2021-02-16 ENCOUNTER — Other Ambulatory Visit (HOSPITAL_COMMUNITY): Payer: Self-pay

## 2021-02-16 ENCOUNTER — Other Ambulatory Visit: Payer: Self-pay | Admitting: Pharmacist

## 2021-02-16 DIAGNOSIS — M0579 Rheumatoid arthritis with rheumatoid factor of multiple sites without organ or systems involvement: Secondary | ICD-10-CM

## 2021-02-16 MED ORDER — ENBREL MINI 50 MG/ML ~~LOC~~ SOCT
50.0000 mg | SUBCUTANEOUS | 2 refills | Status: DC
  Filled 2021-02-16: qty 4, 28d supply, fill #0
  Filled 2021-02-27: qty 4, 28d supply, fill #1
  Filled 2021-03-20: qty 4, 28d supply, fill #2

## 2021-02-16 MED FILL — Etanercept Subcutaneous Solution Cartridge 50 MG/ML: SUBCUTANEOUS | 28 days supply | Qty: 4 | Fill #0 | Status: CN

## 2021-02-16 NOTE — Telephone Encounter (Signed)
Next Visit: 02/20/2021   Last Visit: 09/19/2020   Last Fill: 11/17/2020  [id number] arthritis involving multiple sites with positive rheumatoid factor    Current Dose per office note 09/19/2020, Enbrel 50 mg sq injection once weekly   Labs: 02/09/2021 Glucose 100  TB Gold: 06/10/2020 Neg   Okay to refill Enbrel?

## 2021-02-20 ENCOUNTER — Ambulatory Visit: Payer: 59 | Admitting: Physician Assistant

## 2021-02-20 ENCOUNTER — Other Ambulatory Visit: Payer: Self-pay

## 2021-02-20 ENCOUNTER — Encounter: Payer: Self-pay | Admitting: Physician Assistant

## 2021-02-20 VITALS — BP 139/94 | HR 98 | Ht 65.0 in | Wt 206.6 lb

## 2021-02-20 DIAGNOSIS — Z79899 Other long term (current) drug therapy: Secondary | ICD-10-CM

## 2021-02-20 DIAGNOSIS — Z8679 Personal history of other diseases of the circulatory system: Secondary | ICD-10-CM | POA: Diagnosis not present

## 2021-02-20 DIAGNOSIS — Z8719 Personal history of other diseases of the digestive system: Secondary | ICD-10-CM | POA: Diagnosis not present

## 2021-02-20 DIAGNOSIS — M797 Fibromyalgia: Secondary | ICD-10-CM | POA: Diagnosis not present

## 2021-02-20 DIAGNOSIS — R5383 Other fatigue: Secondary | ICD-10-CM | POA: Diagnosis not present

## 2021-02-20 DIAGNOSIS — M0579 Rheumatoid arthritis with rheumatoid factor of multiple sites without organ or systems involvement: Secondary | ICD-10-CM

## 2021-02-20 DIAGNOSIS — Z87898 Personal history of other specified conditions: Secondary | ICD-10-CM

## 2021-02-20 MED ORDER — DULOXETINE HCL 60 MG PO CPEP
ORAL_CAPSULE | Freq: Every day | ORAL | 2 refills | Status: DC
  Filled 2021-02-20: qty 30, 30d supply, fill #0
  Filled 2021-02-27 – 2021-03-27 (×2): qty 30, 30d supply, fill #1
  Filled 2021-04-26: qty 30, 30d supply, fill #2

## 2021-02-20 MED ORDER — LOSARTAN POTASSIUM 50 MG PO TABS
ORAL_TABLET | Freq: Every day | ORAL | 1 refills | Status: DC
  Filled 2021-02-20: qty 90, fill #0
  Filled 2021-05-11: qty 90, 90d supply, fill #0
  Filled 2021-08-08 – 2021-08-15 (×2): qty 90, 90d supply, fill #1

## 2021-02-20 MED ORDER — TRAZODONE HCL 50 MG PO TABS
25.0000 mg | ORAL_TABLET | Freq: Every evening | ORAL | 1 refills | Status: DC | PRN
  Filled 2021-02-20: qty 30, 60d supply, fill #0

## 2021-02-20 NOTE — Patient Instructions (Signed)
Standing Labs We placed an order today for your standing lab work.   Please have your standing labs drawn in October and every 3 months   If possible, please have your labs drawn 2 weeks prior to your appointment so that the provider can discuss your results at your appointment.  Please note that you may see your imaging and lab results in MyChart before we have reviewed them. We may be awaiting multiple results to interpret others before contacting you. Please allow our office up to 72 hours to thoroughly review all of the results before contacting the office for clarification of your results.  We have open lab daily: Monday through Thursday from 1:30-4:30 PM and Friday from 1:30-4:00 PM at the office of Dr. Shaili Deveshwar, [facility].   Please be advised, all patients with office appointments requiring lab work will take precedent over walk-in lab work.  If possible, please come for your lab work on Monday and Friday afternoons, as you may experience shorter wait times. The office is located at [address], [address], [place], [place] [address] No appointment is necessary.   Labs are drawn by [facility]. Please bring your co-pay at the time of your lab draw.  You may receive a bill from [facility] for your lab work.  If you wish to have your labs drawn at another location, please call the office 24 hours in advance to send orders.  If you have any questions regarding directions or hours of operation,  please call [phone].   As a reminder, please drink plenty of water prior to coming for your lab work. Thanks!  

## 2021-02-20 NOTE — Telephone Encounter (Signed)
Labs-  Just 1 point over on glucose.  All other labs normal.  Just continue to watch carbs in diet to avoid getting pre-diabetes.   Take care,   Dr. Lovena Le

## 2021-02-20 NOTE — Telephone Encounter (Signed)
Results discussed with patient. Patient advised per Dr Lovena Le: Labs-  Just 1 point over on glucose.  All other labs normal.  Just continue to watch carbs in diet to avoid getting pre-diabetes  Patient verbalized understanding.

## 2021-02-21 ENCOUNTER — Other Ambulatory Visit (HOSPITAL_COMMUNITY): Payer: Self-pay

## 2021-02-22 ENCOUNTER — Other Ambulatory Visit (HOSPITAL_COMMUNITY): Payer: Self-pay

## 2021-02-27 ENCOUNTER — Telehealth: Payer: Self-pay

## 2021-02-27 ENCOUNTER — Other Ambulatory Visit (HOSPITAL_COMMUNITY): Payer: Self-pay

## 2021-02-27 NOTE — Telephone Encounter (Signed)
Patient called stating her Enbrel (one month supply / 4 injections) was shipped to her home on Thursday, 02/23/21.  Patient states she was due to take her injection yesterday, 02/26/21 and it was not in the refrigerator where she usually keeps it.  She searched the entire house and thinks she accidentally left the injections in the cooler and threw it away.  Patient requested a return call.

## 2021-02-27 NOTE — Telephone Encounter (Signed)
LMOM with patient to call pharmacy to office, did patient call pharmacy to see if they can assist?

## 2021-02-28 NOTE — Telephone Encounter (Signed)
I called patient, Karen Burton will replace medications.

## 2021-03-16 ENCOUNTER — Other Ambulatory Visit (HOSPITAL_COMMUNITY): Payer: Self-pay

## 2021-03-20 ENCOUNTER — Other Ambulatory Visit (HOSPITAL_COMMUNITY): Payer: Self-pay

## 2021-03-27 ENCOUNTER — Other Ambulatory Visit: Payer: Self-pay

## 2021-03-28 ENCOUNTER — Other Ambulatory Visit (HOSPITAL_COMMUNITY): Payer: Self-pay

## 2021-04-21 ENCOUNTER — Other Ambulatory Visit: Payer: Self-pay | Admitting: Physician Assistant

## 2021-04-21 ENCOUNTER — Telehealth: Payer: Self-pay

## 2021-04-21 ENCOUNTER — Other Ambulatory Visit (HOSPITAL_COMMUNITY): Payer: Self-pay

## 2021-04-21 ENCOUNTER — Other Ambulatory Visit: Payer: Self-pay | Admitting: Pharmacist

## 2021-04-21 DIAGNOSIS — M0579 Rheumatoid arthritis with rheumatoid factor of multiple sites without organ or systems involvement: Secondary | ICD-10-CM

## 2021-04-21 MED ORDER — ENBREL MINI 50 MG/ML ~~LOC~~ SOCT
50.0000 mg | SUBCUTANEOUS | 0 refills | Status: DC
  Filled 2021-04-21: qty 4, 28d supply, fill #0

## 2021-04-21 MED ORDER — ENBREL MINI 50 MG/ML ~~LOC~~ SOCT
50.0000 mg | SUBCUTANEOUS | 0 refills | Status: DC
Start: 2021-04-21 — End: 2021-04-21
  Filled 2021-04-21: qty 4, 28d supply, fill #0

## 2021-04-21 NOTE — Telephone Encounter (Signed)
See mychart message for details.

## 2021-04-21 NOTE — Telephone Encounter (Signed)
Patient left a voicemail stating she received a call from the office that her TB Gold is due.  Patient states she had the test last November and it was scanned into Epic on 08/18/20.  Patient states she can see it in [place] and is not due until November of this year.

## 2021-04-21 NOTE — Telephone Encounter (Signed)
Please notify patient it is not safe to refill Enbrel until she gets TB Gold.  This will be the last refill.

## 2021-04-21 NOTE — Telephone Encounter (Signed)
Next Visit: 08/07/2021  Last Visit: 02/20/2021  Last Fill: 02/16/2021  MB:[id number] arthritis involving multiple sites with positive rheumatoid factor  Current Dose per office note 02/20/2021: Enbrel 50 mg sq injection once weekly  Labs: 02/09/2021 Glucose 100  TB Gold: 10/23/2019 Neg   Left message to advise patient she is due to update her Tb Gold  Okay to refill Enbrel?

## 2021-04-25 ENCOUNTER — Other Ambulatory Visit (HOSPITAL_COMMUNITY): Payer: Self-pay

## 2021-04-26 ENCOUNTER — Other Ambulatory Visit: Payer: Self-pay

## 2021-05-11 ENCOUNTER — Other Ambulatory Visit: Payer: Self-pay

## 2021-05-12 ENCOUNTER — Other Ambulatory Visit: Payer: Self-pay | Admitting: *Deleted

## 2021-05-12 ENCOUNTER — Telehealth: Payer: Self-pay

## 2021-05-12 DIAGNOSIS — M0579 Rheumatoid arthritis with rheumatoid factor of multiple sites without organ or systems involvement: Secondary | ICD-10-CM

## 2021-05-12 DIAGNOSIS — Z79899 Other long term (current) drug therapy: Secondary | ICD-10-CM

## 2021-05-12 NOTE — Telephone Encounter (Signed)
Patient called requesting her labwork orders be faxed to her office.  Patient states they use [facility].  Fax [phone]  Patient states she doesn't think her TB gold is due until November.

## 2021-05-12 NOTE — Telephone Encounter (Signed)
Faxed lab orders 

## 2021-05-18 ENCOUNTER — Other Ambulatory Visit (HOSPITAL_COMMUNITY): Payer: Self-pay

## 2021-05-18 DIAGNOSIS — M0579 Rheumatoid arthritis with rheumatoid factor of multiple sites without organ or systems involvement: Secondary | ICD-10-CM | POA: Diagnosis not present

## 2021-05-18 DIAGNOSIS — Z79899 Other long term (current) drug therapy: Secondary | ICD-10-CM | POA: Diagnosis not present

## 2021-05-19 LAB — CMP14+EGFR
ALT: 21 IU/L (ref 0–32)
AST: 24 IU/L (ref 0–40)
Albumin/Globulin Ratio: 1.5 (ref 1.2–2.2)
Albumin: 4.2 g/dL (ref 3.8–4.8)
Alkaline Phosphatase: 82 IU/L (ref 44–121)
BUN/Creatinine Ratio: 11 (ref 9–23)
BUN: 9 mg/dL (ref 6–24)
Bilirubin Total: 0.3 mg/dL (ref 0.0–1.2)
CO2: 24 mmol/L (ref 20–29)
Calcium: 9.5 mg/dL (ref 8.7–10.2)
Chloride: 103 mmol/L (ref 96–106)
Creatinine, Ser: 0.82 mg/dL (ref 0.57–1.00)
Globulin, Total: 2.8 g/dL (ref 1.5–4.5)
Glucose: 95 mg/dL (ref 70–99)
Potassium: 4.1 mmol/L (ref 3.5–5.2)
Sodium: 140 mmol/L (ref 134–144)
Total Protein: 7 g/dL (ref 6.0–8.5)
eGFR: 90 mL/min/{1.73_m2} (ref >59)

## 2021-05-19 LAB — CBC WITH DIFFERENTIAL/PLATELET
Basophils Absolute: 0 10*3/uL (ref 0.0–0.2)
Basos: 1 %
EOS (ABSOLUTE): 0.1 10*3/uL (ref 0.0–0.4)
Eos: 1 %
Hematocrit: 37.8 % (ref 34.0–46.6)
Hemoglobin: 12.7 g/dL (ref 11.1–15.9)
Immature Grans (Abs): 0 10*3/uL (ref 0.0–0.1)
Immature Granulocytes: 0 %
Lymphocytes Absolute: 1.5 10*3/uL (ref 0.7–3.1)
Lymphs: 32 %
MCH: 29.7 pg (ref 26.6–33.0)
MCHC: 33.6 g/dL (ref 31.5–35.7)
MCV: 89 fL (ref 79–97)
Monocytes Absolute: 0.5 10*3/uL (ref 0.1–0.9)
Monocytes: 10 %
Neutrophils Absolute: 2.7 10*3/uL (ref 1.4–7.0)
Neutrophils: 56 %
Platelets: 268 10*3/uL (ref 150–450)
RBC: 4.27 x10E6/uL (ref 3.77–5.28)
RDW: 12.8 % (ref 11.7–15.4)
WBC: 4.8 10*3/uL (ref 3.4–10.8)

## 2021-05-19 NOTE — Progress Notes (Signed)
CBC and CMP are normal.

## 2021-05-22 ENCOUNTER — Ambulatory Visit: Payer: 59 | Attending: Family Medicine | Admitting: Pharmacist

## 2021-05-22 ENCOUNTER — Other Ambulatory Visit: Payer: Self-pay | Admitting: Rheumatology

## 2021-05-22 ENCOUNTER — Other Ambulatory Visit (HOSPITAL_COMMUNITY): Payer: Self-pay

## 2021-05-22 DIAGNOSIS — Z79899 Other long term (current) drug therapy: Secondary | ICD-10-CM

## 2021-05-22 DIAGNOSIS — M0579 Rheumatoid arthritis with rheumatoid factor of multiple sites without organ or systems involvement: Secondary | ICD-10-CM

## 2021-05-22 MED ORDER — ENBREL MINI 50 MG/ML ~~LOC~~ SOCT
50.0000 mg | SUBCUTANEOUS | 0 refills | Status: DC
  Filled ????-??-??: fill #0

## 2021-05-22 NOTE — Telephone Encounter (Signed)
Next Visit: 08/07/2021  Last Visit: 02/20/2021  Last Fill: 04/21/2021 ( 30 day supply)   EH:[id number] arthritis involving multiple sites with positive rheumatoid factor   Current Dose per office note 02/20/2021: Enbrel 50 mg sq injection once weekly  Labs: 05/18/2021 CBC and CMP are normal  TB Gold: 06/10/2020 Neg    Okay to refill Enbrel?

## 2021-05-22 NOTE — Progress Notes (Signed)
   S: Patient presents for review of their specialty medication therapy.  Patient is currently taking Enbrel for RA. Patient is managed by Dr. Estanislado Pandy for this.   Adherence: confirms.  Efficacy: she is unsure if this medication works for her. Endorses continued pain and fatigue despite being on this medication for a little while now. Plans to discuss this with Dr. Estanislado Pandy in Jan.   Dosing: 50 mg once weekly   Ankylosing spondylitis, psoriatic arthritis, rheumatoid arthritis: SubQ: Note: May continue methotrexate, glucocorticoids, salicylates, NSAIDs, or analgesics during etanercept therapy. Once-weekly dosing: 50 mg once weekly  Screening: TB test: completed per pt  Hepatitis B: completed per pt   Monitoring: Injection site reactions: none  S/sx of infections: none  S/sx of malignancy: none  GI upset: none   O: Lab Results  Component Value Date   WBC 4.8 05/18/2021   HGB 12.7 05/18/2021   HCT 37.8 05/18/2021   MCV 89 05/18/2021   PLT 268 05/18/2021      Chemistry      Component Value Date/Time   NA 140 05/18/2021 1104   K 4.1 05/18/2021 1104   CL 103 05/18/2021 1104   CO2 24 05/18/2021 1104   BUN 9 05/18/2021 1104   CREATININE 0.82 05/18/2021 1104   CREATININE 0.82 10/23/2019 1210      Component Value Date/Time   CALCIUM 9.5 05/18/2021 1104   ALKPHOS 82 05/18/2021 1104   AST 24 05/18/2021 1104   ALT 21 05/18/2021 1104   BILITOT 0.3 05/18/2021 1104       A/P: 1. Medication review: patient currently on Enbrel for RA and is tolerating it well. Reviewed the medication with the patient, including the following: Enbrel (etanercept) binds tumor necrosis factor (TNF) and blocks its interaction with cell surface receptors. TNF plays an important role in the inflammatory processes of many diseases. Patient educated on purpose, proper use and potential adverse effects of Enbrel. SubQ: Administer subcutaneously. Rotate injection sites; may inject into the thigh  (preferred), abdomen (avoiding the 2-inch area around the navel), or outer areas of upper arm. New injections should be given at least 1 inch from an old site and never into areas where the skin is tender, bruised, red, or hard; any raised thick, red, or scaly skin patches or lesions; or areas with scars or stretch marks. For a more comfortable injection, allow autoinjectors, prefilled syringes, and dose trays to reach room temperature for 15 to 30 minutes (?30 minutes for autoinjector) prior to injection; do not remove the needle cover while allowing product to reach room temperature. There may be small white particles of protein in the solution; this is not unusual for proteinaceous solutions. Possible adverse effects include rash, GI upset, increased risk of infection, and injection site reactions. Patients should stop Enbrel if they develop a serious infection. There is a possible increased risk in lymphoma and other malignancies. No recommendations for any changes.   Benard Halsted, PharmD, [facility], [place] [phone]

## 2021-05-25 ENCOUNTER — Other Ambulatory Visit: Payer: Self-pay

## 2021-05-25 ENCOUNTER — Other Ambulatory Visit: Payer: Self-pay | Admitting: Physician Assistant

## 2021-05-26 ENCOUNTER — Other Ambulatory Visit (HOSPITAL_COMMUNITY): Payer: Self-pay

## 2021-05-26 ENCOUNTER — Other Ambulatory Visit: Payer: Self-pay

## 2021-05-26 ENCOUNTER — Other Ambulatory Visit: Payer: Self-pay | Admitting: Pharmacist

## 2021-05-26 DIAGNOSIS — M0579 Rheumatoid arthritis with rheumatoid factor of multiple sites without organ or systems involvement: Secondary | ICD-10-CM

## 2021-05-26 MED ORDER — ENBREL MINI 50 MG/ML ~~LOC~~ SOCT
50.0000 mg | SUBCUTANEOUS | 0 refills | Status: DC
  Filled 2021-05-26: qty 4, 28d supply, fill #0
  Filled 2021-06-21 – 2021-06-30 (×2): qty 4, 28d supply, fill #1
  Filled 2021-07-27: qty 4, 28d supply, fill #2

## 2021-05-26 MED ORDER — DULOXETINE HCL 60 MG PO CPEP
ORAL_CAPSULE | Freq: Every day | ORAL | 2 refills | Status: DC
  Filled 2021-05-26 – 2021-06-26 (×2): qty 30, 30d supply, fill #0
  Filled 2021-06-26 – 2021-07-27 (×2): qty 30, 30d supply, fill #1

## 2021-05-26 NOTE — Telephone Encounter (Signed)
Next Visit: 08/07/2021  Last Visit: 02/20/2021  Last Fill: 02/20/2021  Dx: Fibromyalgia  Current Dose per office note on 02/20/2021: Cymbalta 60 mg 1 capsule by mouth daily   Okay to refill Cymbalta?

## 2021-05-29 ENCOUNTER — Other Ambulatory Visit (HOSPITAL_COMMUNITY): Payer: Self-pay

## 2021-05-30 ENCOUNTER — Other Ambulatory Visit (HOSPITAL_COMMUNITY): Payer: Self-pay

## 2021-06-02 ENCOUNTER — Other Ambulatory Visit: Payer: Self-pay

## 2021-06-08 ENCOUNTER — Ambulatory Visit: Payer: 59 | Admitting: Family Medicine

## 2021-06-19 ENCOUNTER — Other Ambulatory Visit (HOSPITAL_COMMUNITY): Payer: Self-pay

## 2021-06-21 ENCOUNTER — Other Ambulatory Visit (HOSPITAL_COMMUNITY): Payer: Self-pay

## 2021-06-23 ENCOUNTER — Other Ambulatory Visit (HOSPITAL_COMMUNITY): Payer: Self-pay

## 2021-06-26 ENCOUNTER — Encounter: Payer: Self-pay | Admitting: Rheumatology

## 2021-06-26 ENCOUNTER — Other Ambulatory Visit (HOSPITAL_COMMUNITY): Payer: Self-pay

## 2021-06-27 NOTE — Telephone Encounter (Signed)
Please schedule a sooner office visit for evaluation and to discuss treatment options.

## 2021-06-27 NOTE — Telephone Encounter (Signed)
Please clarify if the patient is have any joint swelling in her knees or elbows.

## 2021-06-28 ENCOUNTER — Telehealth: Payer: Self-pay

## 2021-06-28 ENCOUNTER — Other Ambulatory Visit (HOSPITAL_COMMUNITY): Payer: Self-pay

## 2021-06-28 NOTE — Telephone Encounter (Signed)
Submitted a Prior Authorization request to [facility] for ENBREL via CoverMyMeds. Will update once we receive a response.   Key: [id number]

## 2021-06-28 NOTE — Telephone Encounter (Signed)
Please see if the patient can have a sed rate and CRP updated at her office.   There was discussion of proceeding with a total body bone scan in the past to assess for active disease but the patient opted to hold off at that time.  If her sed rate remains elevated a total body bone scan may be helpful for further evaluation.

## 2021-06-29 ENCOUNTER — Other Ambulatory Visit: Payer: Self-pay

## 2021-06-29 ENCOUNTER — Encounter: Payer: Self-pay | Admitting: Nurse Practitioner

## 2021-06-29 ENCOUNTER — Other Ambulatory Visit (HOSPITAL_COMMUNITY): Payer: Self-pay

## 2021-06-29 ENCOUNTER — Ambulatory Visit (INDEPENDENT_AMBULATORY_CARE_PROVIDER_SITE_OTHER): Payer: 59 | Admitting: Nurse Practitioner

## 2021-06-29 VITALS — BP 118/82 | HR 82 | Temp 97.5°F | Ht 65.0 in | Wt 198.0 lb

## 2021-06-29 DIAGNOSIS — F439 Reaction to severe stress, unspecified: Secondary | ICD-10-CM | POA: Diagnosis not present

## 2021-06-29 DIAGNOSIS — R109 Unspecified abdominal pain: Secondary | ICD-10-CM | POA: Diagnosis not present

## 2021-06-29 MED ORDER — LOPERAMIDE HCL 2 MG PO CAPS
2.0000 mg | ORAL_CAPSULE | ORAL | 0 refills | Status: DC | PRN
Start: 2021-06-29 — End: 2022-02-22
  Filled 2021-06-29: qty 20, 2d supply, fill #0

## 2021-06-29 MED ORDER — DICYCLOMINE HCL 10 MG PO CAPS
10.0000 mg | ORAL_CAPSULE | Freq: Three times a day (TID) | ORAL | 0 refills | Status: DC
  Filled 2021-06-29: qty 80, 20d supply, fill #0

## 2021-06-29 NOTE — Telephone Encounter (Signed)
Received notification from [facility] regarding a prior authorization for ENBREL. Authorization has been APPROVED from 06/28/2021 to 06/27/2022.   Patient can continue to fill through [place]: [phone]   Authorization # [id number]  Rinaldo Ratel updated with new PA info.

## 2021-06-29 NOTE — Progress Notes (Signed)
Subjective:     Patient ID: Karen Burton, female   DOB: 10-04-1975, 45 y.o.   MRN: [id number]  Abdominal Pain Associated symptoms include diarrhea and nausea. Pertinent negatives include no constipation, fever, headaches or vomiting.  Patient here with complaints of intermittent abdominal pain x3 weeks. Patient describes pain as crampy that is worse after eating. Patient also admits to bloating, nausea, and diarrhea which has been worse since Friday and occurs directly after eating. Patient believes she has unintentionally lost ~14lbs in the past 3 weeks because she has only been eating 1 meal on most days because she does want her stomach to start hurting.   Patient denies fever or chills, vomiting, blood in stool, cough, sore throat, dyspepsia, travel to a foreign country.   Patient has hx of IBS with diarrhea. Patient recently started a new job ~1 week ago after quitting a job that the patient described as stressful.   Review of Systems  Constitutional:  Positive for unexpected weight change. Negative for activity change, appetite change, chills, diaphoresis, fatigue and fever.  HENT:  Negative for sore throat, trouble swallowing and voice change.   Respiratory:  Negative for cough and shortness of breath.   Gastrointestinal:  Positive for abdominal distention, abdominal pain, diarrhea and nausea. Negative for blood in stool, constipation, rectal pain and vomiting.  Neurological:  Negative for weakness, light-headedness and headaches.      Objective:   Physical Exam Constitutional:      General: She is not in acute distress.    Appearance: She is well-developed. She is not ill-appearing or toxic-appearing.  Cardiovascular:     Rate and Rhythm: Normal rate and regular rhythm.     Heart sounds: Normal heart sounds. No murmur heard. Pulmonary:     Effort: Pulmonary effort is normal. No respiratory distress.     Breath sounds: Normal breath sounds. No wheezing.  Abdominal:      General: Abdomen is flat. Bowel sounds are normal. There is no distension or abdominal bruit.     Palpations: Abdomen is soft. There is no shifting dullness, fluid wave, hepatomegaly, splenomegaly, mass or pulsatile mass.     Tenderness: There is generalized abdominal tenderness and tenderness in the right upper quadrant, right lower quadrant, epigastric area, periumbilical area, suprapubic area, left upper quadrant and left lower quadrant. There is no guarding or rebound. Negative signs include Murphy's sign, Rovsing's sign, McBurney's sign, psoas sign and obturator sign.     Hernia: No hernia is present.  Skin:    General: Skin is warm.  Neurological:     General: No focal deficit present.     Mental Status: She is alert and oriented to person, place, and time.  Psychiatric:        Mood and Affect: Mood normal. Mood is not anxious or depressed.        Behavior: Behavior normal.       Assessment:     1. Stomach pain - Likely IBS with Diarrhea flare up - Infectious etiology not as likely due to duration of illness with patient remaining afebrile. - Will check for gluten sensitivity as well as inflammatory markers - Glia (IgA/G) + tTG IgA - CBC with Differential - C-reactive protein - Sed Rate (ESR) - loperamide (IMODIUM) 2 MG capsule; Take 1 capsule (2 mg total) by mouth as needed for diarrhea or loose stools (take 45 mins prior to eating and one time if you have diarrhea. Do not exceed 8 capsules per day).  Dispense: 20 capsule; Refill: 0 - dicyclomine (BENTYL) 10 MG capsule; Take 1 capsule (10 mg total) by mouth 4 (four) times daily -  before meals and at bedtime for 20 days.  Dispense: 80 capsule; Refill: 0 - Discussed s/e of medication in detail. Medication intended for short term relief of IBS symptoms.   - OTC Probiotic - RTC if symptoms worse or develop fever.  2. Stress - Offered counseling. Patient stated that she would think about it. - Patient currently on Cymbalta.

## 2021-06-30 ENCOUNTER — Other Ambulatory Visit (HOSPITAL_COMMUNITY): Payer: Self-pay

## 2021-07-01 ENCOUNTER — Other Ambulatory Visit (HOSPITAL_COMMUNITY): Payer: Self-pay

## 2021-07-01 LAB — CBC WITH DIFFERENTIAL/PLATELET
Basophils Absolute: 0 10*3/uL (ref 0.0–0.2)
Basos: 1 %
EOS (ABSOLUTE): 0.1 10*3/uL (ref 0.0–0.4)
Eos: 2 %
Hematocrit: 38.8 % (ref 34.0–46.6)
Hemoglobin: 13 g/dL (ref 11.1–15.9)
Immature Grans (Abs): 0 10*3/uL (ref 0.0–0.1)
Immature Granulocytes: 0 %
Lymphocytes Absolute: 1.7 10*3/uL (ref 0.7–3.1)
Lymphs: 32 %
MCH: 29.6 pg (ref 26.6–33.0)
MCHC: 33.5 g/dL (ref 31.5–35.7)
MCV: 88 fL (ref 79–97)
Monocytes Absolute: 0.6 10*3/uL (ref 0.1–0.9)
Monocytes: 11 %
Neutrophils Absolute: 2.8 10*3/uL (ref 1.4–7.0)
Neutrophils: 54 %
Platelets: 265 10*3/uL (ref 150–450)
RBC: 4.39 x10E6/uL (ref 3.77–5.28)
RDW: 12.3 % (ref 11.7–15.4)
WBC: 5.2 10*3/uL (ref 3.4–10.8)

## 2021-07-01 LAB — C-REACTIVE PROTEIN: CRP: 10 mg/L (ref 0–10)

## 2021-07-01 LAB — GLIA (IGA/G) + TTG IGA
Antigliadin Abs, IgA: 5 units (ref 0–19)
Gliadin IgG: 3 units (ref 0–19)
Transglutaminase IgA: <2 U/mL (ref 0–3)

## 2021-07-01 LAB — SEDIMENTATION RATE: Sed Rate: 20 mm/hr (ref 0–32)

## 2021-07-02 LAB — QUANTIFERON-TB GOLD PLUS
QuantiFERON Mitogen Value: 7.94 IU/mL
QuantiFERON Nil Value: 0.01 IU/mL
QuantiFERON TB1 Ag Value: 0.01 IU/mL
QuantiFERON TB2 Ag Value: 0.02 IU/mL
QuantiFERON-TB Gold Plus: NEGATIVE

## 2021-07-02 NOTE — Progress Notes (Signed)
TB Gold is negative.

## 2021-07-04 ENCOUNTER — Other Ambulatory Visit (HOSPITAL_COMMUNITY): Payer: Self-pay

## 2021-07-20 ENCOUNTER — Other Ambulatory Visit (HOSPITAL_COMMUNITY): Payer: Self-pay

## 2021-07-25 NOTE — Progress Notes (Deleted)
Office Visit Note  Patient: Karen Burton             Date of Birth: 09-Jul-1976           MRN: [id number]             PCP: Coral Spikes, DO Referring: Erven Colla, DO Visit Date: 08/07/2021 Occupation: @GUAROCC @  Subjective:  No chief complaint on file.   History of Present Illness: Karen Burton is a 45 y.o. female ***   Activities of Daily Living:  Patient reports morning stiffness for *** {minute/hour:19697}.   Patient {ACTIONS;DENIES/REPORTS:21021675::"Denies"} nocturnal pain.  Difficulty dressing/grooming: {ACTIONS;DENIES/REPORTS:21021675::"Denies"} Difficulty climbing stairs: {ACTIONS;DENIES/REPORTS:21021675::"Denies"} Difficulty getting out of chair: {ACTIONS;DENIES/REPORTS:21021675::"Denies"} Difficulty using hands for taps, buttons, cutlery, and/or writing: {ACTIONS;DENIES/REPORTS:21021675::"Denies"}  No Rheumatology ROS completed.   PMFS History:  Patient Active Problem List   Diagnosis Date Noted   Encounter for screening fecal occult blood testing 01/02/2021   Acute bacterial rhinosinusitis 11/24/2020   Encounter for colorectal cancer screening 11/19/2019   Weight gain 11/19/2019   Sleep disturbance 11/19/2019   Visit for gynecologic examination 11/19/2019   Screening for colorectal cancer 02/13/2019   Encounter for gynecological examination with Papanicolaou smear of cervix 02/13/2019   Fibroids 02/13/2019   Encounter for surveillance of contraceptive pills 08/12/2017   Encounter for well woman exam with routine gynecological exam 08/12/2017   ANA positive 11/06/2016   Fibromyalgia 11/06/2016   Menorrhagia with regular cycle 12/20/2015   Dysmenorrhea 12/20/2015   Uterine leiomyoma 12/20/2015   Vulvar itching 12/20/2015   Anemia 12/20/2015   Essential hypertension 01/12/2013   Esophageal reflux 01/12/2013   Rheumatoid arthritis ([place]) 01/12/2013   Rash and nonspecific skin eruption 01/12/2013    Past Medical History:   Diagnosis Date   Anemia    Arthritis    Dysmenorrhea 12/20/2015   Eczema    mild   Fibroids 12/20/2015   Fibromyalgia    Menorrhagia with regular cycle 12/20/2015   Rash    Reflux    Rhinitis    chonic   Vitamin D deficiency    Vulvar itching 12/20/2015    Family History  Problem Relation Age of Onset   Diabetes Mother    Hypertension Mother    Asthma Mother    Cancer Mother        breast   Thyroid disease Mother    Arthritis Mother    Berenice Primas' disease Mother    Cancer Maternal Grandmother        pancreatic   Fibroids Sister    COPD Maternal Aunt    Asthma Maternal Aunt    Gout Maternal Aunt    Past Surgical History:  Procedure Laterality Date   ESOPHAGOGASTRODUODENOSCOPY ENDOSCOPY     IR GENERIC HISTORICAL  02/07/2016   IR RADIOLOGIST EVAL & MGMT 02/07/2016 [name], MD [facility] INTERV RAD   WISDOM TOOTH EXTRACTION     Social History   Social History Narrative   Not on file   Immunization History  Administered Date(s) Administered   DT (Pediatric) 02/20/2013   Influenza-Unspecified 07/18/2009, 05/16/2016, 06/03/2017, 04/07/2018, 04/27/2021   PFIZER(Purple Top)SARS-COV-2 Vaccination 04/01/2020, 04/22/2020   Pneumococcal Polysaccharide-23 05/08/2007     Objective: Vital Signs: There were no vitals taken for this visit.   Physical Exam   Musculoskeletal Exam: ***  CDAI Exam: CDAI Score: -- Patient Global: --; Provider Global: -- Swollen: --; Tender: -- Joint Exam 08/07/2021   No joint exam has been documented for this visit  There is currently no information documented on the homunculus. Go to the Rheumatology activity and complete the homunculus joint exam.  Investigation: No additional findings.  Imaging: No results found.  Recent Labs: Lab Results  Component Value Date   WBC 5.2 06/29/2021   HGB 13.0 06/29/2021   PLT 265 06/29/2021   NA 140 05/18/2021   K 4.1 05/18/2021   CL 103 05/18/2021   CO2 24 05/18/2021   GLUCOSE 95 05/18/2021    BUN 9 05/18/2021   CREATININE 0.82 05/18/2021   BILITOT 0.3 05/18/2021   ALKPHOS 82 05/18/2021   AST 24 05/18/2021   ALT 21 05/18/2021   PROT 7.0 05/18/2021   ALBUMIN 4.2 05/18/2021   CALCIUM 9.5 05/18/2021   GFRAA 92 06/10/2020   QFTBGOLDPLUS Negative 06/29/2021    Speciality Comments: Prior therapy includes: methotrexate and Plaquenil (neutropenia) and Humira (painful injections)  Procedures:  No procedures performed Allergies: Ceftin [cefuroxime axetil], Humira [adalimumab], and Methotrexate derivatives   Assessment / Plan:     Visit Diagnoses: No diagnosis found.  Orders: No orders of the defined types were placed in this encounter.  No orders of the defined types were placed in this encounter.   Face-to-face time spent with patient was *** minutes. Greater than 50% of time was spent in counseling and coordination of care.  Follow-Up Instructions: No follow-ups on file.   Earnestine Mealing, CMA  Note - This record has been created using Editor, commissioning.  Chart creation errors have been sought, but may not always  have been located. Such creation errors do not reflect on  the standard of medical care.

## 2021-07-27 ENCOUNTER — Other Ambulatory Visit (HOSPITAL_COMMUNITY): Payer: Self-pay

## 2021-08-06 ENCOUNTER — Ambulatory Visit
Admission: EM | Admit: 2021-08-06 | Discharge: 2021-08-06 | Disposition: A | Discharge status: Home / Self Care | Payer: 59 | Attending: Student | Admitting: Student

## 2021-08-06 ENCOUNTER — Other Ambulatory Visit: Payer: Self-pay

## 2021-08-06 DIAGNOSIS — Z20822 Contact with and (suspected) exposure to covid-19: Secondary | ICD-10-CM | POA: Diagnosis not present

## 2021-08-06 DIAGNOSIS — Z1152 Encounter for screening for COVID-19: Secondary | ICD-10-CM

## 2021-08-06 DIAGNOSIS — R112 Nausea with vomiting, unspecified: Secondary | ICD-10-CM

## 2021-08-06 DIAGNOSIS — J069 Acute upper respiratory infection, unspecified: Secondary | ICD-10-CM

## 2021-08-06 DIAGNOSIS — Z3041 Encounter for surveillance of contraceptive pills: Secondary | ICD-10-CM

## 2021-08-06 MED ORDER — ONDANSETRON 8 MG PO TBDP: 8.0000 mg | ORAL_TABLET | Freq: Three times a day (TID) | ORAL | 0 refills | Status: DC | PRN

## 2021-08-06 MED ORDER — LIDOCAINE VISCOUS HCL 2 % MT SOLN: 15.0000 mL | OROMUCOSAL | 0 refills | Status: DC | PRN

## 2021-08-06 MED ORDER — PROMETHAZINE-DM 6.25-15 MG/5ML PO SYRP: 5.0000 mL | ORAL_SOLUTION | Freq: Four times a day (QID) | ORAL | 0 refills | Status: DC | PRN

## 2021-08-06 MED ORDER — BENZONATATE 100 MG PO CAPS: 100.0000 mg | ORAL_CAPSULE | Freq: Three times a day (TID) | ORAL | 0 refills | Status: DC

## 2021-08-06 NOTE — ED Provider Notes (Signed)
[facility]    CSN: [id number] Arrival date & time: 08/06/21  1432      History   Chief Complaint Chief Complaint  Patient presents with   Headache   Chest Pain   Sore Throat        Cough    HPI Karen Burton is a 46 y.o. female presenting with suspected COVID-19 for 5 days following exposure to this at home.  Medical history noncontributory- denies pulm ds.  Describes subjective chills, sore throat, cough, headaches, chest tightness with coughing and taking a deep breath. Temperature running 98.1 at home. Nausea with one episode of bilious vomiting today, no diarrhea or abd pain. Cough is nonproductive. Throbbing headaches only minimally relieved by tylenol/ibuprofen. Denies SOB, CP at rest. States has tried 'every over-the-counter medication.' Multiple negative home covid tests so far.   HPI  Past Medical History:  Diagnosis Date   Anemia    Arthritis    Dysmenorrhea 12/20/2015   Eczema    mild   Fibroids 12/20/2015   Fibromyalgia    Menorrhagia with regular cycle 12/20/2015   Rash    Reflux    Rhinitis    chonic   Vitamin D deficiency    Vulvar itching 12/20/2015    Patient Active Problem List   Diagnosis Date Noted   Encounter for screening fecal occult blood testing 01/02/2021   Acute bacterial rhinosinusitis 11/24/2020   Encounter for colorectal cancer screening 11/19/2019   Weight gain 11/19/2019   Sleep disturbance 11/19/2019   Visit for gynecologic examination 11/19/2019   Screening for colorectal cancer 02/13/2019   Encounter for gynecological examination with Papanicolaou smear of cervix 02/13/2019   Fibroids 02/13/2019   Encounter for surveillance of contraceptive pills 08/12/2017   Encounter for well woman exam with routine gynecological exam 08/12/2017   ANA positive 11/06/2016   Fibromyalgia 11/06/2016   Menorrhagia with regular cycle 12/20/2015   Dysmenorrhea 12/20/2015   Uterine leiomyoma 12/20/2015   Vulvar itching  12/20/2015   Anemia 12/20/2015   Essential hypertension 01/12/2013   Esophageal reflux 01/12/2013   Rheumatoid arthritis ([place]) 01/12/2013   Rash and nonspecific skin eruption 01/12/2013    Past Surgical History:  Procedure Laterality Date   ESOPHAGOGASTRODUODENOSCOPY ENDOSCOPY     IR GENERIC HISTORICAL  02/07/2016   IR RADIOLOGIST EVAL & MGMT 02/07/2016 [name], MD [facility] INTERV RAD   WISDOM TOOTH EXTRACTION      OB History     Gravida  1   Para      Term      Preterm      AB  1   Living         SAB      IAB      Ectopic      Multiple      Live Births               Home Medications    Prior to Admission medications   Medication Sig Start Date End Date Taking? Authorizing Provider  benzonatate (TESSALON) 100 MG capsule Take 1 capsule (100 mg total) by mouth every 8 (eight) hours. 08/06/21  Yes Hazel Sams, PA-C  lidocaine (XYLOCAINE) 2 % solution Use as directed 15 mLs in the mouth or throat every 4 (four) hours as needed for mouth pain. 08/06/21  Yes Hazel Sams, PA-C  ondansetron (ZOFRAN-ODT) 8 MG disintegrating tablet Take 1 tablet (8 mg total) by mouth every 8 (eight) hours as needed for  nausea or vomiting. 08/06/21  Yes Hazel Sams, PA-C  promethazine-dextromethorphan (PROMETHAZINE-DM) 6.25-15 MG/5ML syrup Take 5 mLs by mouth 4 (four) times daily as needed for cough. 08/06/21  Yes Hazel Sams, PA-C  acetaminophen (TYLENOL) 500 MG tablet Take 1,000 mg by mouth as needed.    [provider]  amLODipine (NORVASC) 5 MG tablet TAKE 1 TABLET BY MOUTH DAILY. FOR BLOOD PRESSURE 02/13/21 02/13/22  Elvia Collum M, DO  cetirizine (ZYRTEC) 10 MG tablet Take 10 mg by mouth daily.    [provider]  dicyclomine (BENTYL) 10 MG capsule Take 1 capsule (10 mg total) by mouth 4 (four) times daily -  before meals and at bedtime for 20 days. 06/29/21 07/19/21  Ameduite, Trenton Gammon, NP  diphenhydrAMINE (BENADRYL) 25 MG tablet Take 25 mg by mouth  every 6 (six) hours as needed.    [provider]  docusate sodium (COLACE) 100 MG capsule Take 100 mg by mouth daily.     [provider]  DULoxetine (CYMBALTA) 60 MG capsule TAKE 1 CAPSULE BY MOUTH ONCE DAILY 05/26/21 05/26/22  Bo Merino, MD  Etanercept (ENBREL MINI) 50 MG/ML SOCT INJECT 50 MG INTO THE SKIN ONCE A WEEK. 05/26/21 05/26/22  Tresa Garter, MD  hydrochlorothiazide (HYDRODIURIL) 12.5 MG tablet TAKE 1 TABLET BY MOUTH DAILY. 02/13/21 02/13/22  Erven Colla, DO  ibuprofen (ADVIL,MOTRIN) 200 MG tablet Take 200 mg by mouth as needed.    [provider]  loperamide (IMODIUM) 2 MG capsule Take 1 capsule (2 mg total) by mouth as needed for diarrhea or loose stools (take 45 mins prior to eating and one time if you have diarrhea. Do not exceed 8 capsules per day). 06/29/21   Ameduite, Trenton Gammon, NP  losartan (COZAAR) 50 MG tablet TAKE 1 TABLET BY MOUTH DAILY. 02/20/21   Erven Colla, DO  Melatonin 5 MG CAPS Take by mouth at bedtime.    [provider]  Multiple Vitamins-Minerals (MULTIVITAMIN WOMEN PO) Take by mouth daily.    [provider]  Norethindrone Acetate-Ethinyl Estrad-FE (LOESTRIN 24 FE) 1-20 MG-MCG(24) tablet TAKE 1 TABLET BY MOUTH ONCE DAILY (USE NDC [address]-46) 01/02/21 01/02/22  Estill Dooms, NP  traZODone (DESYREL) 50 MG tablet Take 0.5 tablets (25 mg total) by mouth at bedtime as needed. 02/20/21   Ofilia Neas, PA-C  triamcinolone (NASACORT) 55 MCG/ACT AERO nasal inhaler use 2 sprays each nostril daily Patient taking differently: Place into the nose as needed. 12/12/20     zolpidem (AMBIEN) 5 MG tablet Take 1 tablet (5 mg total) by mouth at bedtime as needed for sleep. 11/19/19   Estill Dooms, NP    Family History Family History  Problem Relation Age of Onset   Diabetes Mother    Hypertension Mother    Asthma Mother    Cancer Mother        breast   Thyroid disease Mother    Arthritis Mother     Berenice Primas' disease Mother    Cancer Maternal Grandmother        pancreatic   Fibroids Sister    COPD Maternal Aunt    Asthma Maternal Aunt    Gout Maternal Aunt     Social History Social History   Tobacco Use   Smoking status: Never   Smokeless tobacco: Never  Vaping Use   Vaping Use: Never used  Substance Use Topics   Alcohol use: No   Drug use: Never  Allergies   Ceftin [cefuroxime axetil], Humira [adalimumab], and Methotrexate derivatives   Review of Systems Review of Systems  Constitutional:  Negative for appetite change, chills and fever.  HENT:  Positive for congestion. Negative for ear pain, rhinorrhea, sinus pressure, sinus pain and sore throat.   Eyes:  Negative for redness and visual disturbance.  Respiratory:  Positive for cough. Negative for chest tightness, shortness of breath and wheezing.   Cardiovascular:  Negative for chest pain and palpitations.  Gastrointestinal:  Negative for abdominal pain, constipation, diarrhea, nausea and vomiting.  Genitourinary:  Negative for dysuria, frequency and urgency.  Musculoskeletal:  Negative for myalgias.  Neurological:  Negative for dizziness, weakness and headaches.  Psychiatric/Behavioral:  Negative for confusion.   All other systems reviewed and are negative.   Physical Exam Triage Vital Signs ED Triage Vitals [08/06/21 1443]  Enc Vitals Group     BP 131/89     Pulse Rate (!) 112     Resp 19     Temp 98.5 F (36.9 C)     Temp src      SpO2 97 %     Weight      Height      Head Circumference      Peak Flow      Pain Score      Pain Loc      Pain Edu?      Excl. in [place]?    No data found.  Updated Vital Signs BP 131/89 (BP Location: Right Arm)    Pulse (!) 112    Temp 98.5 F (36.9 C)    Resp 19    SpO2 97%   Visual Acuity Right Eye Distance:   Left Eye Distance:   Bilateral Distance:    Right Eye Near:   Left Eye Near:    Bilateral Near:     Physical Exam Vitals reviewed.   Constitutional:      General: She is not in acute distress.    Appearance: Normal appearance. She is ill-appearing.  HENT:     Head: Normocephalic and atraumatic.     Right Ear: Tympanic membrane, ear canal and external ear normal. No tenderness. No middle ear effusion. There is no impacted cerumen. Tympanic membrane is not perforated, erythematous, retracted or bulging.     Left Ear: Tympanic membrane, ear canal and external ear normal. No tenderness.  No middle ear effusion. There is no impacted cerumen. Tympanic membrane is not perforated, erythematous, retracted or bulging.     Nose: Nose normal. No congestion.     Mouth/Throat:     Mouth: Mucous membranes are moist.     Pharynx: Uvula midline. No oropharyngeal exudate or posterior oropharyngeal erythema.  Eyes:     Extraocular Movements: Extraocular movements intact.     Pupils: Pupils are equal, round, and reactive to light.  Cardiovascular:     Rate and Rhythm: Regular rhythm. Tachycardia present.     Heart sounds: Normal heart sounds.  Pulmonary:     Effort: Pulmonary effort is normal.     Breath sounds: Normal breath sounds. No decreased breath sounds, wheezing, rhonchi or rales.  Chest:     Chest wall: Tenderness present.  Abdominal:     Palpations: Abdomen is soft.     Tenderness: There is no abdominal tenderness. There is no guarding or rebound.  Lymphadenopathy:     Cervical: No cervical adenopathy.     Right cervical: No superficial cervical adenopathy.    Left  cervical: No superficial cervical adenopathy.  Neurological:     General: No focal deficit present.     Mental Status: She is alert and oriented to person, place, and time.  Psychiatric:        Mood and Affect: Mood normal.        Behavior: Behavior normal.        Thought Content: Thought content normal.        Judgment: Judgment normal.     UC Treatments / Results  Labs (all labs ordered are listed, but only abnormal results are displayed) Labs  Reviewed  COVID-19, FLU A+B NAA    EKG   Radiology No results found.  Procedures Procedures (including critical care time)  Medications Ordered in UC Medications - No data to display  Initial Impression / Assessment and Plan / UC Course  I have reviewed the triage vital signs and the nursing notes.  Pertinent labs & imaging results that were available during my care of the patient were reviewed by me and considered in my medical decision making (see chart for details).     This patient is a very pleasant 46 y.o. year old female presenting with covid-19. Afebrile, but tachy. Breath sounds clear throughout.   Exposure to sister who has covid. Symptoms x5 days. Not a candidate for antiviral given age, duration of symptoms. She is in agreement.  Covid and influenza PCR sent.  Promethazine DM, tessalon, viscous lidocaine, zofran ODT. Good hydration. BRAT diet.  ED return precautions discussed. Patient verbalizes understanding and agreement.   Coding Level 4 for acute illness with systemic symptoms, and prescription drug management   Final Clinical Impressions(s) / UC Diagnoses   Final diagnoses:  Viral URI with cough  Nausea and vomiting, unspecified vomiting type  Exposure to confirmed case of COVID-19  Encounter for screening for COVID-19  Oral contraceptive use  Exposure to COVID-19 virus     Discharge Instructions      -Promethazine DM cough syrup for congestion/cough. This could make you drowsy, so take at night before bed. -Take the Zofran (ondansetron) up to 3 times daily for nausea and vomiting. Dissolve one pill under your tongue or between your teeth and your cheek. -Tessalon (Benzonatate) as needed for cough. Take one pill up to 3x daily (every 8 hours) -For sore throat, use lidocaine mouthwash up to every 4 hours. Make sure not to eat for at least 1 hour after using this, as your mouth will be very numb and you could bite yourself. -You can take Tylenol up  to 1000 mg 3 times daily, and ibuprofen up to 600 mg 3 times daily with food.  You can take these together, or alternate every 3-4 hours. -With a virus, you're typically contagious for 5-7 days, or as long as you're having fevers.  -Follow-up if symptoms worsen: shortness of breath, fevers/chills, etc.      ED Prescriptions     Medication Sig Dispense Auth. Provider   ondansetron (ZOFRAN-ODT) 8 MG disintegrating tablet Take 1 tablet (8 mg total) by mouth every 8 (eight) hours as needed for nausea or vomiting. 20 tablet Marin Roberts E, PA-C   lidocaine (XYLOCAINE) 2 % solution Use as directed 15 mLs in the mouth or throat every 4 (four) hours as needed for mouth pain. 100 mL Hazel Sams, PA-C   promethazine-dextromethorphan (PROMETHAZINE-DM) 6.25-15 MG/5ML syrup Take 5 mLs by mouth 4 (four) times daily as needed for cough. 118 mL Hazel Sams, PA-C  benzonatate (TESSALON) 100 MG capsule Take 1 capsule (100 mg total) by mouth every 8 (eight) hours. 21 capsule Hazel Sams, PA-C      PDMP not reviewed this encounter.   Hazel Sams, PA-C 08/06/21 1502

## 2021-08-06 NOTE — ED Triage Notes (Signed)
Pt reports chest tightness when cough or take a deep breath, chills, sore throat, cough, headache. Pt reports she tested positive for COVID on 08/01/2020.

## 2021-08-06 NOTE — Discharge Instructions (Signed)
-  Promethazine DM cough syrup for congestion/cough. This could make you drowsy, so take at night before bed. -Take the Zofran (ondansetron) up to 3 times daily for nausea and vomiting. Dissolve one pill under your tongue or between your teeth and your cheek. -Tessalon (Benzonatate) as needed for cough. Take one pill up to 3x daily (every 8 hours) -For sore throat, use lidocaine mouthwash up to every 4 hours. Make sure not to eat for at least 1 hour after using this, as your mouth will be very numb and you could bite yourself. -You can take Tylenol up to 1000 mg 3 times daily, and ibuprofen up to 600 mg 3 times daily with food.  You can take these together, or alternate every 3-4 hours. -With a virus, you're typically contagious for 5-7 days, or as long as you're having fevers.  -Follow-up if symptoms worsen: shortness of breath, fevers/chills, etc.

## 2021-08-07 ENCOUNTER — Ambulatory Visit: Payer: 59 | Admitting: Rheumatology

## 2021-08-07 LAB — COVID-19, FLU A+B NAA
Influenza A, NAA: NOT DETECTED
Influenza B, NAA: NOT DETECTED
SARS-CoV-2, NAA: DETECTED — AB

## 2021-08-08 ENCOUNTER — Telehealth: Payer: Self-pay | Admitting: Family Medicine

## 2021-08-08 ENCOUNTER — Encounter: Payer: Self-pay | Admitting: Family Medicine

## 2021-08-08 NOTE — Telephone Encounter (Signed)
Pt called. She states she had went to Urgent Care and tested positive for Covid. She asked was she a candidate for the Covid medication. [facility]. [phone]

## 2021-08-08 NOTE — Telephone Encounter (Signed)
Patient states she would be interested in having med sent to [facility]

## 2021-08-09 ENCOUNTER — Other Ambulatory Visit: Payer: Self-pay | Admitting: Family Medicine

## 2021-08-09 ENCOUNTER — Other Ambulatory Visit: Payer: Self-pay

## 2021-08-09 MED ORDER — NIRMATRELVIR/RITONAVIR (PAXLOVID)TABLET: 3.0000 | ORAL_TABLET | Freq: Two times a day (BID) | ORAL | 0 refills | Status: AC

## 2021-08-15 ENCOUNTER — Other Ambulatory Visit: Payer: Self-pay

## 2021-08-15 ENCOUNTER — Other Ambulatory Visit (HOSPITAL_COMMUNITY): Payer: Self-pay

## 2021-08-15 ENCOUNTER — Other Ambulatory Visit: Payer: Self-pay | Admitting: Physician Assistant

## 2021-08-15 DIAGNOSIS — M0579 Rheumatoid arthritis with rheumatoid factor of multiple sites without organ or systems involvement: Secondary | ICD-10-CM

## 2021-08-15 MED ORDER — ENBREL MINI 50 MG/ML ~~LOC~~ SOCT
50.0000 mg | SUBCUTANEOUS | 0 refills | Status: DC
  Filled 2021-08-15: qty 4, 28d supply, fill #0

## 2021-08-15 NOTE — Telephone Encounter (Signed)
Next Visit: Due January 2023. Message sent to the front to schedule patient.    Last Visit: 02/20/2021   Last Fill:05/26/2021  Labs: 06/29/2021 CBC WNL 05/18/2021 CMP WNL  TB Gold: 06/29/2021 Neg   Current Dose per office note on 02/20/2021: Enbrel 50 mg sq injection once weekly  DG:[id number] arthritis involving multiple sites with positive rheumatoid factor   Okay to refill Enbrel?

## 2021-08-17 ENCOUNTER — Other Ambulatory Visit: Payer: Self-pay

## 2021-08-17 ENCOUNTER — Encounter: Payer: Self-pay | Admitting: Nurse Practitioner

## 2021-08-17 ENCOUNTER — Ambulatory Visit (INDEPENDENT_AMBULATORY_CARE_PROVIDER_SITE_OTHER): Payer: 59 | Admitting: Nurse Practitioner

## 2021-08-17 VITALS — BP 132/82 | HR 92 | Temp 98.2°F | Ht 65.0 in | Wt 193.0 lb

## 2021-08-17 DIAGNOSIS — R059 Cough, unspecified: Secondary | ICD-10-CM

## 2021-08-17 DIAGNOSIS — R053 Chronic cough: Secondary | ICD-10-CM | POA: Diagnosis not present

## 2021-08-17 DIAGNOSIS — U099 Post covid-19 condition, unspecified: Secondary | ICD-10-CM | POA: Diagnosis not present

## 2021-08-17 NOTE — Progress Notes (Signed)
° °  Subjective:    Patient ID: Karen Burton, female    DOB: 02/24/1976, 46 y.o.   MRN: [id number]  HPI  Patient arrives stating se needs a release back to work note after recent Covid illness. Tested positive 08/07/21 via urgent care- completed antiviral medication.   Patient present to clinic still symptomatic. Patient admits to cough, congestion, sore throat, drainage, ear pain, headaches. Patient denies SOB, body aches, fever (although, patient never had a fever), chills, nausea, wheezing.   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 Patient also has hx of RA and takes Enbrel Mini.   Review of Systems  HENT:  Positive for congestion, postnasal drip and sore throat.   Respiratory:  Positive for cough.   Neurological:  Positive for headaches.      Objective:   Physical Exam Constitutional:      Appearance: Normal appearance.  HENT:     Right Ear: Tympanic membrane, ear canal and external ear normal. There is no impacted cerumen.     Left Ear: Tympanic membrane, ear canal and external ear normal. There is no impacted cerumen.     Nose: Congestion and rhinorrhea present.     Mouth/Throat:     Mouth: Mucous membranes are moist.     Pharynx: Oropharynx is clear. No oropharyngeal exudate or posterior oropharyngeal erythema.  Cardiovascular:     Rate and Rhythm: Normal rate and regular rhythm.     Pulses: Normal pulses.     Heart sounds: Normal heart sounds. No murmur heard. Pulmonary:     Effort: Pulmonary effort is normal. No respiratory distress.     Breath sounds: Normal breath sounds. No wheezing.  Musculoskeletal:        General: Normal range of motion.     Cervical back: Normal range of motion and neck supple. No rigidity or tenderness.  Lymphadenopathy:     Cervical: No cervical adenopathy.  Skin:    General: Skin is warm.  Neurological:     General: No focal deficit present.     Mental Status: She is alert and oriented to person, place, and time.   Psychiatric:        Mood and Affect: Mood normal.        Behavior: Behavior normal.          Assessment & Plan:  1. Cough, unspecified type - Possible long COVID symptoms. Today is Day 10 from positive COVID test. - Due to patients's possible immunosuppression due to use of Enbrel, symptoms may be lingering longer.  - Due to sensitive nature of patient's job, would like to wait 14 days prior to clearance to ensure she is not contagious. - COVID-19, Flu A+B and RSV - Follow up on Monday for review of symptoms. If symptoms stable and not worsening, will likely clear for work.  - RTC on Monday.

## 2021-08-18 ENCOUNTER — Encounter: Payer: Self-pay | Admitting: Nurse Practitioner

## 2021-08-18 LAB — COVID-19, FLU A+B AND RSV
Influenza A, NAA: NOT DETECTED
Influenza B, NAA: NOT DETECTED
RSV, NAA: NOT DETECTED
SARS-CoV-2, NAA: NOT DETECTED

## 2021-08-19 ENCOUNTER — Encounter: Payer: Self-pay | Admitting: Nurse Practitioner

## 2021-08-19 NOTE — Progress Notes (Signed)
Great News!  You are COVID negative. I will draft two letters for you. One with your COVID results and the other one releasing you to work starting today.   If start you start to feel bad again or develop SOB or difficulty breathing please come by to be evaluated.  Barbee Shropshire

## 2021-08-21 ENCOUNTER — Ambulatory Visit: Payer: 59 | Admitting: Nurse Practitioner

## 2021-08-21 ENCOUNTER — Other Ambulatory Visit (HOSPITAL_COMMUNITY): Payer: Self-pay

## 2021-08-21 NOTE — Progress Notes (Signed)
08/21/21-Message sent to front to schedule appt

## 2021-08-21 NOTE — Progress Notes (Signed)
Nurses, Please provide patient with note that does NOT release her back to work today. And please schedule patient to be seen Monday. I will re-eval patient on Monday

## 2021-08-28 ENCOUNTER — Other Ambulatory Visit (HOSPITAL_COMMUNITY): Payer: Self-pay

## 2021-08-29 ENCOUNTER — Other Ambulatory Visit (HOSPITAL_COMMUNITY): Payer: Self-pay

## 2021-08-30 ENCOUNTER — Other Ambulatory Visit (HOSPITAL_COMMUNITY): Payer: Self-pay

## 2021-09-01 ENCOUNTER — Other Ambulatory Visit: Payer: Self-pay | Admitting: Family Medicine

## 2021-09-01 ENCOUNTER — Other Ambulatory Visit (HOSPITAL_COMMUNITY): Payer: Self-pay

## 2021-09-01 ENCOUNTER — Telehealth: Payer: Self-pay | Admitting: Family Medicine

## 2021-09-01 ENCOUNTER — Other Ambulatory Visit: Payer: Self-pay | Admitting: Nurse Practitioner

## 2021-09-01 ENCOUNTER — Other Ambulatory Visit: Payer: Self-pay | Admitting: Rheumatology

## 2021-09-01 DIAGNOSIS — I10 Essential (primary) hypertension: Secondary | ICD-10-CM

## 2021-09-01 DIAGNOSIS — R109 Unspecified abdominal pain: Secondary | ICD-10-CM

## 2021-09-01 MED ORDER — AMLODIPINE BESYLATE 5 MG PO TABS
ORAL_TABLET | Freq: Every day | ORAL | 1 refills | Status: DC
  Filled 2021-09-01: qty 90, 90d supply, fill #0

## 2021-09-01 MED ORDER — HYDROCHLOROTHIAZIDE 12.5 MG PO TABS
ORAL_TABLET | Freq: Every day | ORAL | 1 refills | Status: DC
  Filled 2021-09-01: qty 90, 90d supply, fill #0

## 2021-09-01 MED ORDER — DICYCLOMINE HCL 10 MG PO CAPS
10.0000 mg | ORAL_CAPSULE | Freq: Three times a day (TID) | ORAL | 0 refills | Status: DC
  Filled 2021-09-01: qty 80, 20d supply, fill #0

## 2021-09-01 MED ORDER — DULOXETINE HCL 60 MG PO CPEP
ORAL_CAPSULE | Freq: Every day | ORAL | 0 refills | Status: DC
  Filled 2021-09-01: qty 30, 30d supply, fill #0

## 2021-09-01 NOTE — Telephone Encounter (Signed)
Next Visit: 09/06/2021   Last Visit: 02/20/2021   Last Fill: 02/20/2021   Dx: Fibromyalgia   Current Dose per office note on 02/20/2021: Cymbalta 60 mg 1 capsule by mouth daily    Okay to refill Cymbalta?

## 2021-09-01 NOTE — Telephone Encounter (Signed)
Pt has appt with Dr Brent Bulla 09/06/21 at 1pm. Pt is out of HCTZ and Amlodipine. [place] [facility] [place].   [phone]

## 2021-09-01 NOTE — Telephone Encounter (Signed)
Please clarify if she is taking 30 mg or 60 mg of cymbalta daily?

## 2021-09-01 NOTE — Progress Notes (Signed)
Office Visit Note  Patient: Karen Burton             Date of Birth: [birth year]           MRN: [id number]             PCP: Coral Spikes, DO Referring: Erven Colla, DO Visit Date: 09/06/2021 Occupation: @GUAROCC @  Subjective:  Medication monitoring   History of Present Illness: Karen Burton is a 46 y.o. female with history of seropositive rheumatoid arthritis and fibromyalgia.  She remains on Enbrel 50 mg subcutaneous injections once weekly.  Patient reports that she was diagnosed with COVID-19 on 08/06/2021.  She states that she held Enbrel for about 3 weeks during that time.  Her symptoms have completely resolved.  She states that while off of Enbrel she started to have increased pain in the left elbow and both knees.  Her left elbow joint pain has improved but she continues to have some tenderness. She experiences intermittent myalgias and muscle tenderness due to fibromyalgia.  She continues to experience fatigue intermittently.     Activities of Daily Living:  Patient reports morning stiffness for all day.  Patient Reports nocturnal pain.  Difficulty dressing/grooming: Denies Difficulty climbing stairs: Denies Difficulty getting out of chair: Denies Difficulty using hands for taps, buttons, cutlery, and/or writing: Denies  Review of Systems  Constitutional:  Positive for fatigue.  HENT:  Positive for mouth dryness and nose dryness. Negative for mouth sores.        Nose sores  Eyes:  Positive for dryness. Negative for pain and itching.  Respiratory:  Negative for shortness of breath and difficulty breathing.   Cardiovascular:  Negative for chest pain and palpitations.  Gastrointestinal:  Positive for constipation. Negative for blood in stool and diarrhea.  Endocrine: Negative for increased urination.  Genitourinary:  Negative for difficulty urinating.  Musculoskeletal:  Positive for joint pain, joint pain, joint swelling, myalgias, morning stiffness,  muscle tenderness and myalgias.  Skin:  Negative for color change, rash and redness.  Allergic/Immunologic: Negative for susceptible to infections.  Neurological:  Positive for numbness and weakness. Negative for dizziness, headaches and memory loss.  Hematological:  Positive for bruising/bleeding tendency.  Psychiatric/Behavioral:  Negative for confusion.    PMFS History:  Patient Active Problem List   Diagnosis Date Noted   Encounter for screening fecal occult blood testing 01/02/2021   Acute bacterial rhinosinusitis 11/24/2020   Encounter for colorectal cancer screening 11/19/2019   Weight gain 11/19/2019   Sleep disturbance 11/19/2019   Visit for gynecologic examination 11/19/2019   Screening for colorectal cancer 02/13/2019   Encounter for gynecological examination with Papanicolaou smear of cervix 02/13/2019   Fibroids 02/13/2019   Encounter for surveillance of contraceptive pills 08/12/2017   Encounter for well woman exam with routine gynecological exam 08/12/2017   ANA positive 11/06/2016   Fibromyalgia 11/06/2016   Menorrhagia with regular cycle 12/20/2015   Dysmenorrhea 12/20/2015   Uterine leiomyoma 12/20/2015   Vulvar itching 12/20/2015   Anemia 12/20/2015   Essential hypertension 01/12/2013   Esophageal reflux 01/12/2013   Rheumatoid arthritis ([place]) 01/12/2013   Rash and nonspecific skin eruption 01/12/2013    Past Medical History:  Diagnosis Date   Anemia    Arthritis    Dysmenorrhea 12/20/2015   Eczema    mild   Fibroids 12/20/2015   Fibromyalgia    Menorrhagia with regular cycle 12/20/2015   Rash    Reflux    Rhinitis  chonic   Vitamin D deficiency    Vulvar itching 12/20/2015    Family History  Problem Relation Age of Onset   Diabetes Mother    Hypertension Mother    Asthma Mother    Cancer Mother        breast   Thyroid disease Mother    Arthritis Mother    Berenice Primas' disease Mother    Cancer Maternal Grandmother        pancreatic   Fibroids  Sister    COPD Maternal Aunt    Asthma Maternal Aunt    Gout Maternal Aunt    Past Surgical History:  Procedure Laterality Date   ESOPHAGOGASTRODUODENOSCOPY ENDOSCOPY     IR GENERIC HISTORICAL  02/07/2016   IR RADIOLOGIST EVAL & MGMT 02/07/2016 [name], MD [facility] INTERV RAD   WISDOM TOOTH EXTRACTION     Social History   Social History Narrative   Not on file   Immunization History  Administered Date(s) Administered   DT (Pediatric) 02/20/2013   Influenza-Unspecified 07/18/2009, 05/16/2016, 06/03/2017, 04/07/2018, 04/27/2021   PFIZER(Purple Top)SARS-COV-2 Vaccination 04/01/2020, 04/22/2020   Pneumococcal Polysaccharide-23 05/08/2007     Objective: Vital Signs: BP 131/89 (BP Location: Left Arm, Patient Position: Sitting, Cuff Size: Large)    Pulse 87    Ht 5\' 5"  (1.651 m)    Wt 199 lb 12.8 oz (90.6 kg)    BMI 33.25 kg/m    Physical Exam Vitals and nursing note reviewed.  Constitutional:      Appearance: She is well-developed.  HENT:     Head: Normocephalic and atraumatic.  Eyes:     Conjunctiva/sclera: Conjunctivae normal.  Cardiovascular:     Rate and Rhythm: Normal rate and regular rhythm.     Heart sounds: Normal heart sounds.  Pulmonary:     Effort: Pulmonary effort is normal.     Breath sounds: Normal breath sounds.  Abdominal:     General: Bowel sounds are normal.     Palpations: Abdomen is soft.  Musculoskeletal:     Cervical back: Normal range of motion.  Lymphadenopathy:     Cervical: No cervical adenopathy.  Skin:    General: Skin is warm and dry.     Capillary Refill: Capillary refill takes less than 2 seconds.  Neurological:     Mental Status: She is alert and oriented to person, place, and time.  Psychiatric:        Behavior: Behavior normal.     Musculoskeletal Exam: C-spine, thoracic spine, and lumbar spine good ROM.  Shoulder joints have good ROM.  Tenderness over the lateral epicondyle of the left elbow.  Wrist joints, MCPs, PIPs, and DIPs  good ROM with no synovitis.  Complete fist formation bilaterally.  Hip joints, knee joints, and ankle joints have good ROM with no discomfort.  No warmth or effusion of knee joints.  No tenderness or swelling of ankle joints.  CDAI Exam: CDAI Score: 1  Patient Global: 6 mm; Provider Global: 4 mm Swollen: 0 ; Tender: 0  Joint Exam 09/06/2021   No joint exam has been documented for this visit   There is currently no information documented on the homunculus. Go to the Rheumatology activity and complete the homunculus joint exam.  Investigation: No additional findings.  Imaging: No results found.  Recent Labs: Lab Results  Component Value Date   WBC 5.2 06/29/2021   HGB 13.0 06/29/2021   PLT 265 06/29/2021   NA 140 05/18/2021   K 4.1 05/18/2021  CL 103 05/18/2021   CO2 24 05/18/2021   GLUCOSE 95 05/18/2021   BUN 9 05/18/2021   CREATININE 0.82 05/18/2021   BILITOT 0.3 05/18/2021   ALKPHOS 82 05/18/2021   AST 24 05/18/2021   ALT 21 05/18/2021   PROT 7.0 05/18/2021   ALBUMIN 4.2 05/18/2021   CALCIUM 9.5 05/18/2021   GFRAA 92 06/10/2020   QFTBGOLDPLUS Negative 06/29/2021    Speciality Comments: Prior therapy includes: methotrexate and Plaquenil (neutropenia) and Humira (painful injections)  Procedures:  No procedures performed Allergies: Ceftin [cefuroxime axetil], Humira [adalimumab], and Methotrexate derivatives   Assessment / Plan:     Visit Diagnoses: Rheumatoid arthritis involving multiple sites with positive rheumatoid factor ([place]): She has no synovitis on examination today.  She has not had any signs or symptoms of a rheumatoid arthritis flare.  She experiences intermittent discomfort in both knee joints but has good range of motion of both knees on examination today with no warmth or effusion.  She has tenderness palpation over the lateral condyle of the left elbow joint but no inflammation was noted.  She is clinically doing well on Enbrel 50 mg subcutaneous  injections once weekly.  She is tolerating Enbrel without any side effects or injection site reactions.  Discussed the importance of joint protection and muscle strengthening.  She was strongly encouraged to increase her exercise regimen.  She will remain on Enbrel as monotherapy.  She was advised to notify us if she develops increased joint pain or joint swelling.  She will follow-up in the office in 5 months.  High risk medication use - Enbrel 50 mg sq injection once weekly. - Plan: COMPLETE METABOLIC PANEL WITH GFR, CBC with Differential/Platelet TB Gold negative on 06/29/2021 and will continue to be monitored yearly.  CBC updated on 06/29/2021.  CMP drawn on 05/18/2021.  CMP is due today. She was diagnosed with COVID-19 on 08/06/2021.  She held Enbrel for 3 weeks until her symptoms completely resolved.  Discussed the importance of holding Enbrel if she develops signs or symptoms of infection and to resume once the infection is completely cleared. Discussed the importance of yearly skin examinations while on Enbrel.  Referral to dermatology will be placed today.  Fibromyalgia: She continues to experience intermittent myalgias and muscle tenderness due to fibromyalgia.  She presents today with tenderness over the lateral epicondyle of the left elbow joint.  Discussed the importance of performing daily stretching exercises.  Discussed that she will significantly benefit from water aerobics and water exercise.  She can also try using a heating pad as well as pain patches for symptomatic relief.  Discussed the importance of regular exercise and good sleep hygiene.  Other fatigue: Discussed the importance of increasing her activity level.  She would greatly benefit from water aerobics/exercise.  History of hypertension: Blood pressure was 131/89 today in the office.  History of insomnia - She takes trazodone 50 mg half tablet (25 mg total) at bedtime for insomnia.  A refill was sent to the pharmacy  today.  History of gastroesophageal reflux (GERD)  Orders: Orders Placed This Encounter  Procedures   COMPLETE METABOLIC PANEL WITH GFR   CBC with Differential/Platelet   Meds ordered this encounter  Medications   traZODone (DESYREL) 50 MG tablet    Sig: Take 0.5 tablets (25 mg total) by mouth at bedtime as needed.    Dispense:  30 tablet    Refill:  1    Follow-Up Instructions: Return in about 5 months (around 02/03/2022)  for Rheumatoid arthritis, Fibromyalgia.   Ofilia Neas, PA-C  Note - This record has been created using Dragon software.  Chart creation errors have been sought, but may not always  have been located. Such creation errors do not reflect on  the standard of medical care.

## 2021-09-01 NOTE — Telephone Encounter (Signed)
Patient states she is taking Cymbalta 60 mg daily.

## 2021-09-01 NOTE — Telephone Encounter (Signed)
Patient informed that prescriptions sent to pharmacy. Verbalized understanding

## 2021-09-02 ENCOUNTER — Other Ambulatory Visit (HOSPITAL_COMMUNITY): Payer: Self-pay

## 2021-09-06 ENCOUNTER — Ambulatory Visit (INDEPENDENT_AMBULATORY_CARE_PROVIDER_SITE_OTHER): Payer: 59 | Admitting: Family Medicine

## 2021-09-06 ENCOUNTER — Other Ambulatory Visit (HOSPITAL_COMMUNITY): Payer: Self-pay

## 2021-09-06 ENCOUNTER — Ambulatory Visit: Payer: 59 | Admitting: Physician Assistant

## 2021-09-06 ENCOUNTER — Other Ambulatory Visit: Payer: Self-pay

## 2021-09-06 ENCOUNTER — Encounter: Payer: Self-pay | Admitting: Physician Assistant

## 2021-09-06 VITALS — BP 131/89 | HR 87 | Ht 65.0 in | Wt 199.8 lb

## 2021-09-06 DIAGNOSIS — I10 Essential (primary) hypertension: Secondary | ICD-10-CM | POA: Diagnosis not present

## 2021-09-06 DIAGNOSIS — Z87898 Personal history of other specified conditions: Secondary | ICD-10-CM | POA: Diagnosis not present

## 2021-09-06 DIAGNOSIS — M797 Fibromyalgia: Secondary | ICD-10-CM

## 2021-09-06 DIAGNOSIS — Z8679 Personal history of other diseases of the circulatory system: Secondary | ICD-10-CM | POA: Diagnosis not present

## 2021-09-06 DIAGNOSIS — Z8719 Personal history of other diseases of the digestive system: Secondary | ICD-10-CM

## 2021-09-06 DIAGNOSIS — G4700 Insomnia, unspecified: Secondary | ICD-10-CM | POA: Insufficient documentation

## 2021-09-06 DIAGNOSIS — Z79899 Other long term (current) drug therapy: Secondary | ICD-10-CM | POA: Diagnosis not present

## 2021-09-06 DIAGNOSIS — M0579 Rheumatoid arthritis with rheumatoid factor of multiple sites without organ or systems involvement: Secondary | ICD-10-CM

## 2021-09-06 DIAGNOSIS — R5383 Other fatigue: Secondary | ICD-10-CM | POA: Diagnosis not present

## 2021-09-06 LAB — CBC WITH DIFFERENTIAL/PLATELET
Absolute Monocytes: 647 cells/uL (ref 200–950)
Basophils Absolute: 32 cells/uL (ref 0–200)
Basophils Relative: 0.6 %
Eosinophils Absolute: 148 cells/uL (ref 15–500)
Eosinophils Relative: 2.8 %
HCT: 34.2 % — ABNORMAL LOW (ref 35.0–45.0)
Hemoglobin: 11.2 g/dL — ABNORMAL LOW (ref 11.7–15.5)
Lymphs Abs: 1585 cells/uL (ref 850–3900)
MCH: 29.3 pg (ref 27.0–33.0)
MCHC: 32.7 g/dL (ref 32.0–36.0)
MCV: 89.5 fL (ref 80.0–100.0)
MPV: 10.3 fL (ref 7.5–12.5)
Monocytes Relative: 12.2 %
Neutro Abs: 2889 cells/uL (ref 1500–7800)
Neutrophils Relative %: 54.5 %
Platelets: 248 10*3/uL (ref 140–400)
RBC: 3.82 10*6/uL (ref 3.80–5.10)
RDW: 13.6 % (ref 11.0–15.0)
Total Lymphocyte: 29.9 %
WBC: 5.3 10*3/uL (ref 3.8–10.8)

## 2021-09-06 LAB — COMPLETE METABOLIC PANEL WITH GFR
AG Ratio: 1.4 (calc) (ref 1.0–2.5)
ALT: 23 U/L (ref 6–29)
AST: 24 U/L (ref 10–35)
Albumin: 3.7 g/dL (ref 3.6–5.1)
Alkaline phosphatase (APISO): 62 U/L (ref 31–125)
BUN: 13 mg/dL (ref 7–25)
CO2: 28 mmol/L (ref 20–32)
Calcium: 9 mg/dL (ref 8.6–10.2)
Chloride: 105 mmol/L (ref 98–110)
Creat: 0.78 mg/dL (ref 0.50–0.99)
Globulin: 2.7 g/dL (calc) (ref 1.9–3.7)
Glucose, Bld: 99 mg/dL (ref 65–99)
Potassium: 3.9 mmol/L (ref 3.5–5.3)
Sodium: 139 mmol/L (ref 135–146)
Total Bilirubin: 0.3 mg/dL (ref 0.2–1.2)
Total Protein: 6.4 g/dL (ref 6.1–8.1)
eGFR: 95 mL/min/{1.73_m2} (ref >=60)

## 2021-09-06 MED ORDER — LOSARTAN POTASSIUM 50 MG PO TABS
ORAL_TABLET | Freq: Every day | ORAL | 3 refills | Status: DC
  Filled 2021-09-06: qty 90, fill #0
  Filled 2021-11-17: qty 90, 90d supply, fill #0
  Filled 2022-02-13: qty 90, 90d supply, fill #1
  Filled 2022-05-18: qty 90, 90d supply, fill #2
  Filled 2022-08-21: qty 90, 90d supply, fill #3

## 2021-09-06 MED ORDER — HYDROCHLOROTHIAZIDE 12.5 MG PO TABS
ORAL_TABLET | Freq: Every day | ORAL | 3 refills | Status: DC
  Filled 2021-09-06: qty 90, fill #0
  Filled 2021-12-07: qty 90, 90d supply, fill #0
  Filled 2022-03-07: qty 90, 90d supply, fill #1
  Filled 2022-06-02: qty 90, 90d supply, fill #2
  Filled 2022-08-23: qty 90, 90d supply, fill #3

## 2021-09-06 MED ORDER — TRAZODONE HCL 50 MG PO TABS
25.0000 mg | ORAL_TABLET | Freq: Every evening | ORAL | 1 refills | Status: DC | PRN
  Filled 2021-09-06: qty 30, 60d supply, fill #0

## 2021-09-06 MED ORDER — AMLODIPINE BESYLATE 5 MG PO TABS
ORAL_TABLET | Freq: Every day | ORAL | 3 refills | Status: DC
  Filled 2021-09-06: qty 90, fill #0
  Filled 2021-12-07: qty 90, 90d supply, fill #0
  Filled 2022-03-07: qty 90, 90d supply, fill #1
  Filled 2022-06-02: qty 90, 90d supply, fill #2
  Filled 2022-08-29: qty 90, 90d supply, fill #3

## 2021-09-06 NOTE — Progress Notes (Signed)
Subjective:  Patient ID: Karen Burton, female    DOB: [birth year]  Age: 46 y.o. MRN: [id number]  CC: Chief Complaint  Patient presents with   Hypertension   Medication Refill    HPI:  46 year old female with hypertension, fibromyalgia, RA presents for follow-up and for medication refill.  Patient states that she is doing well.  She follows closely with rheumatology regarding her rheumatoid arthritis.  Patient states that her hypertension has been stable.  BP fairly well controlled here today.  She is on amlodipine 5 mg daily, HCTZ 12.5 mg daily, and losartan 50 mg daily.  Patient is in need of refill.  Patient has no other complaints or concerns at this time.  Patient Active Problem List   Diagnosis Date Noted   Insomnia 09/06/2021   Fibromyalgia 11/06/2016   Uterine leiomyoma 12/20/2015   Essential hypertension 01/12/2013   Esophageal reflux 01/12/2013   Rheumatoid arthritis ([place]) 01/12/2013    Social Hx   Social History   Socioeconomic History   Marital status: Single    Spouse name: Not on file   Number of children: 0   Years of education: Not on file   Highest education level: Not on file  Occupational History   Not on file  Tobacco Use   Smoking status: Never    Passive exposure: Never   Smokeless tobacco: Never  Vaping Use   Vaping Use: Never used  Substance and Sexual Activity   Alcohol use: No   Drug use: Never   Sexual activity: Not Currently    Birth control/protection: Pill  Other Topics Concern   Not on file  Social History Narrative   Not on file   Social Determinants of Health   Financial Resource Strain: Low Risk    Difficulty of Paying Living Expenses: Not hard at all  Food Insecurity: No Food Insecurity   Worried About Charity fundraiser in the Last Year: Never true   Ran Out of Food in the Last Year: Never true  Transportation Needs: No Transportation Needs   Lack of Transportation (Medical): No   Lack of Transportation  (Non-Medical): No  Physical Activity: Insufficiently Active   Days of Exercise per Week: 1 day   Minutes of Exercise per Session: 20 min  Stress: No Stress Concern Present   Feeling of Stress : Only a little  Social Connections: Socially Isolated   Frequency of Communication with Friends and Family: More than three times a week   Frequency of Social Gatherings with Friends and Family: More than three times a week   Attends Religious Services: Never   Marine scientist or Organizations: No   Attends Archivist Meetings: Never   Marital Status: Never married    Review of Systems  Constitutional: Negative.   Musculoskeletal:  Positive for arthralgias.   Objective:  BP 140/80    Pulse 100    Temp 98.2 F (36.8 C) (Oral)    Ht 5\' 5"  (1.651 m)    Wt 201 lb 3.2 oz (91.3 kg)    SpO2 97%    BMI 33.48 kg/m   BP/Weight 09/06/2021 09/06/2021 1/44/3154  Systolic BP 008 676 195  Diastolic BP 80 89 82  Wt. (Lbs) 201.2 199.8 193  BMI 33.48 33.25 32.12    Physical Exam Vitals and nursing note reviewed.  Constitutional:      General: She is not in acute distress.    Appearance: Normal appearance. She is not  ill-appearing.  Cardiovascular:     Rate and Rhythm: Normal rate and regular rhythm.  Pulmonary:     Effort: Pulmonary effort is normal.     Breath sounds: Normal breath sounds. No wheezing, rhonchi or rales.  Neurological:     Mental Status: She is alert.  Psychiatric:        Mood and Affect: Mood normal.        Behavior: Behavior normal.    Lab Results  Component Value Date   WBC 5.2 06/29/2021   HGB 13.0 06/29/2021   HCT 38.8 06/29/2021   PLT 265 06/29/2021   GLUCOSE 95 05/18/2021   CHOL 135 02/09/2021   TRIG 91 02/09/2021   HDL 44 02/09/2021   LDLCALC 74 02/09/2021   ALT 21 05/18/2021   AST 24 05/18/2021   NA 140 05/18/2021   K 4.1 05/18/2021   CL 103 05/18/2021   CREATININE 0.82 05/18/2021   BUN 9 05/18/2021   CO2 24 05/18/2021   TSH 1.390  11/19/2019     Assessment & Plan:   Problem List Items Addressed This Visit       Cardiovascular and Mediastinum   Essential hypertension    Stable.  Continue amlodipine, HCTZ, and losartan.  Medications were refilled today.      Relevant Medications   amLODipine (NORVASC) 5 MG tablet   hydrochlorothiazide (HYDRODIURIL) 12.5 MG tablet   losartan (COZAAR) 50 MG tablet    Meds ordered this encounter  Medications   amLODipine (NORVASC) 5 MG tablet    Sig: TAKE 1 TABLET BY MOUTH DAILY FOR BLOOD PRESSURE    Dispense:  90 tablet    Refill:  3   hydrochlorothiazide (HYDRODIURIL) 12.5 MG tablet    Sig: TAKE 1 TABLET BY MOUTH DAILY.    Dispense:  90 tablet    Refill:  3   losartan (COZAAR) 50 MG tablet    Sig: TAKE 1 TABLET BY MOUTH DAILY.    Dispense:  90 tablet    Refill:  3    Follow-up:  Return in about 6 months (around 03/06/2022).  [place]

## 2021-09-06 NOTE — Patient Instructions (Signed)
Standing Labs °We placed an order today for your standing lab work.  ° °Please have your standing labs drawn in May and every 3 months  ° ° °If possible, please have your labs drawn 2 weeks prior to your appointment so that the provider can discuss your results at your appointment. ° °Please note that you may see your imaging and lab results in MyChart before we have reviewed them. °We may be awaiting multiple results to interpret others before contacting you. °Please allow our office up to 72 hours to thoroughly review all of the results before contacting the office for clarification of your results. ° °We have open lab daily: °Monday through Thursday from 1:30-4:30 PM and Friday from 1:30-4:00 PM °at the office of Dr. Shaili Deveshwar, [facility].   °Please be advised, all patients with office appointments requiring lab work will take precedent over walk-in lab work.  °If possible, please come for your lab work on Monday and Friday afternoons, as you may experience shorter wait times. °The office is located at [address], [address], , [place] [address] °No appointment is necessary.   °Labs are drawn by [facility]. Please bring your co-pay at the time of your lab draw.  You may receive a bill from [facility] for your lab work. ° °Please note if you are on Hydroxychloroquine and and an order has been placed for a Hydroxychloroquine level, you will need to have it drawn 4 hours or more after your last dose. ° °If you wish to have your labs drawn at another location, please call the office 24 hours in advance to send orders. ° °If you have any questions regarding directions or hours of operation,  °please call [phone].   °As a reminder, please drink plenty of water prior to coming for your lab work. Thanks! ° °

## 2021-09-06 NOTE — Patient Instructions (Signed)
Follow up every 6 months.  Take care  Dr. Lacinda Axon

## 2021-09-06 NOTE — Addendum Note (Signed)
Addended by: Earnestine Mealing on: 09/06/2021 02:13 PM   Modules accepted: Orders

## 2021-09-06 NOTE — Assessment & Plan Note (Signed)
Stable.  Continue amlodipine, HCTZ, and losartan.  Medications were refilled today.

## 2021-09-07 NOTE — Progress Notes (Signed)
CBC WNL. Hgb and hct are borderline low. Rest of CBC WNL.  Please encourage patient to take a multivitamin with iron.

## 2021-09-13 ENCOUNTER — Other Ambulatory Visit (HOSPITAL_COMMUNITY): Payer: Self-pay

## 2021-09-15 ENCOUNTER — Other Ambulatory Visit (HOSPITAL_COMMUNITY): Payer: Self-pay

## 2021-09-15 ENCOUNTER — Other Ambulatory Visit: Payer: Self-pay | Admitting: Pharmacist

## 2021-09-15 DIAGNOSIS — M0579 Rheumatoid arthritis with rheumatoid factor of multiple sites without organ or systems involvement: Secondary | ICD-10-CM

## 2021-09-15 MED ORDER — ENBREL MINI 50 MG/ML ~~LOC~~ SOCT
50.0000 mg | SUBCUTANEOUS | 0 refills | Status: DC
  Filled 2021-09-15: qty 8, 56d supply, fill #0
  Filled 2021-09-29: qty 4, 28d supply, fill #0
  Filled 2021-10-24: qty 4, 28d supply, fill #1

## 2021-09-18 ENCOUNTER — Other Ambulatory Visit (HOSPITAL_COMMUNITY): Payer: Self-pay

## 2021-09-29 ENCOUNTER — Other Ambulatory Visit (HOSPITAL_COMMUNITY): Payer: Self-pay

## 2021-09-29 ENCOUNTER — Other Ambulatory Visit: Payer: Self-pay | Admitting: Physician Assistant

## 2021-10-02 ENCOUNTER — Other Ambulatory Visit (HOSPITAL_COMMUNITY): Payer: Self-pay

## 2021-10-02 MED ORDER — DULOXETINE HCL 60 MG PO CPEP
ORAL_CAPSULE | Freq: Every day | ORAL | 2 refills | Status: DC
  Filled 2021-10-02: qty 30, 30d supply, fill #0
  Filled 2021-11-05: qty 30, 30d supply, fill #1
  Filled 2021-12-07: qty 30, 30d supply, fill #2

## 2021-10-02 NOTE — Telephone Encounter (Signed)
Next Visit: 02/06/2022 ? ?Last Visit: 09/06/2021 ? ?Last Fill: 09/01/2021 ( 30 day supply) ? ?Dx: Fibromyalgia ? ?Current Dose per office note on 09/06/2021: not discussed ? ?Okay to refill Cymbalta?   ?

## 2021-10-24 ENCOUNTER — Other Ambulatory Visit (HOSPITAL_COMMUNITY): Payer: Self-pay

## 2021-11-01 ENCOUNTER — Other Ambulatory Visit (HOSPITAL_COMMUNITY): Payer: Self-pay

## 2021-11-06 ENCOUNTER — Other Ambulatory Visit (HOSPITAL_COMMUNITY): Payer: Self-pay

## 2021-11-08 ENCOUNTER — Other Ambulatory Visit (HOSPITAL_COMMUNITY): Payer: Self-pay

## 2021-11-10 ENCOUNTER — Other Ambulatory Visit (HOSPITAL_COMMUNITY): Payer: Self-pay

## 2021-11-12 ENCOUNTER — Ambulatory Visit (INDEPENDENT_AMBULATORY_CARE_PROVIDER_SITE_OTHER): Payer: 59

## 2021-11-12 ENCOUNTER — Encounter: Payer: Self-pay | Admitting: Emergency Medicine

## 2021-11-12 ENCOUNTER — Ambulatory Visit
Admission: EM | Admit: 2021-11-12 | Discharge: 2021-11-12 | Disposition: A | Discharge status: Home / Self Care | Payer: 59 | Attending: Urgent Care | Admitting: Urgent Care

## 2021-11-12 DIAGNOSIS — S99922A Unspecified injury of left foot, initial encounter: Secondary | ICD-10-CM | POA: Diagnosis not present

## 2021-11-12 DIAGNOSIS — M79672 Pain in left foot: Secondary | ICD-10-CM | POA: Diagnosis not present

## 2021-11-12 DIAGNOSIS — M79675 Pain in left toe(s): Secondary | ICD-10-CM | POA: Diagnosis not present

## 2021-11-12 NOTE — ED Provider Notes (Addendum)
[facility]    CSN: [id number] Arrival date & time: 11/12/21  1310      History   Chief Complaint No chief complaint on file.   HPI Karen Burton is a 46 y.o. female.   Patient presents to urgent care for evaluation of her left fourth and fifth toe injury after injuring them on a parking concrete block while attempting to get into her car.  States she took Tylenol and Aleve last night with some relief and elevated her left foot overnight. States pain is mild at this time with no movement of her toes. Reports difficulty walking on left foot without limp.  Reports difficulty wearing socks and shoes due to pressure applied on her toes.  Currently wearing her crocs at time of visit.  Denies other aggravating or relieving factors.  Denies fever, nausea, cold chills, hitting her head, LOC, and decree sensation to left lower extremity.      Past Medical History:  Diagnosis Date   Anemia    Arthritis    Dysmenorrhea 12/20/2015   Eczema    mild   Fibroids 12/20/2015   Fibromyalgia    Menorrhagia with regular cycle 12/20/2015   Rash    Reflux    Rhinitis    chonic   Vitamin D deficiency    Vulvar itching 12/20/2015    Patient Active Problem List   Diagnosis Date Noted   Insomnia 09/06/2021   Fibromyalgia 11/06/2016   Uterine leiomyoma 12/20/2015   Essential hypertension 01/12/2013   Esophageal reflux 01/12/2013   Rheumatoid arthritis (HCC) 01/12/2013    Past Surgical History:  Procedure Laterality Date   ESOPHAGOGASTRODUODENOSCOPY ENDOSCOPY     IR GENERIC HISTORICAL  02/07/2016   IR RADIOLOGIST EVAL & MGMT 02/07/2016 Simonne Come, MD [facility]   WISDOM TOOTH EXTRACTION      OB History     Gravida  1   Para      Term      Preterm      AB  1   Living         SAB      IAB      Ectopic      Multiple      Live Births               Home Medications    Prior to Admission medications   Medication Sig Start Date End Date  Taking? Authorizing Provider  DULoxetine (CYMBALTA) 60 MG capsule TAKE 1 CAPSULE BY MOUTH ONCE DAILY 10/02/21 10/02/22  Gearldine Bienenstock, PA-C  acetaminophen (TYLENOL) 500 MG tablet Take 1,000 mg by mouth as needed.    [provider]  amLODipine (NORVASC) 5 MG tablet TAKE 1 TABLET BY MOUTH DAILY FOR BLOOD PRESSURE 09/06/21 09/06/22  Tommie Sams, DO  dicyclomine (BENTYL) 10 MG capsule Take 1 capsule (10 mg total) by mouth 4 (four) times daily -  before meals and at bedtime for 20 days. 09/01/21 09/24/21  [employer]meduite, Alvino Chapel, NP  docusate sodium (COLACE) 100 MG capsule Take 100 mg by mouth daily.     [provider]  Etanercept (ENBREL MINI) 50 MG/ML SOCT Inject 50 mg into the skin once a week. 09/15/21   Quentin Angst, MD  hydrochlorothiazide (HYDRODIURIL) 12.5 MG tablet TAKE 1 TABLET BY MOUTH DAILY. 09/06/21 09/06/22  Tommie Sams, DO  loperamide (IMODIUM) 2 MG capsule Take 1 capsule (2 mg total) by mouth as needed for diarrhea or loose  stools (take 45 mins prior to eating and one time if you have diarrhea. Do not exceed 8 capsules per day). 06/29/21   [employer]medu[employer], Alvino Chapel, NP  losartan (COZAAR) 50 MG tablet TAKE 1 TABLET BY MOUTH DAILY. 09/06/21   Tommie Sams, DO  Melatonin 5 MG CAPS Take by mouth at bedtime.    [provider]  Multiple Vitamins-Minerals (MULTIVITAMIN WOMEN PO) Take by mouth daily.    [provider]  Norethindrone Acetate-Ethinyl Estrad-FE (LOESTRIN 24 FE) 1-20 MG-MCG(24) tablet TAKE 1 TABLET BY MOUTH ONCE DAILY (USE NDC [address]-84) 01/02/21 01/02/22  Cyril Mourning A, NP  ondansetron (ZOFRAN-ODT) 8 MG disintegrating tablet Take 1 tablet (8 mg total) by mouth every 8 (eight) hours as needed for nausea or vomiting. 08/06/21   Rhys Martini, PA-C  traZODone (DESYREL) 50 MG tablet Take 1/2 tablet (25 mg total) by mouth at bedtime as needed. 09/06/21   Gearldine Bienenstock, PA-C  triamcinolone (NASACORT) 55 MCG/ACT AERO nasal inhaler use 2 sprays each nostril  daily Patient taking differently: Place into the nose as needed. 12/12/20     zolpidem (AMBIEN) 5 MG tablet Take 1 tablet (5 mg total) by mouth at bedtime as needed for sleep. 11/19/19   Adline Potter, NP    Family History Family History  Problem Relation Age of Onset   Diabetes Mother    Hypertension Mother    Asthma Mother    Cancer Mother        breast   Thyroid disease Mother    Arthritis Mother    Luiz Blare' disease Mother    Cancer Maternal Grandmother        pancreatic   Fibroids Sister    COPD Maternal Aunt    Asthma Maternal Aunt    Gout Maternal Aunt     Social History Social History   Tobacco Use   Smoking status: Never    Passive exposure: Never   Smokeless tobacco: Never  Vaping Use   Vaping Use: Never used  Substance Use Topics   Alcohol use: No   Drug use: Never     Allergies   Ceftin [cefuroxime axetil], Humira [adalimumab], and Methotrexate derivatives   Review of Systems Review of Systems Per HPI  Physical Exam Triage Vital Signs ED Triage Vitals [11/12/21 1328]  Enc Vitals Group     BP 130/83     Pulse Rate 82     Resp 18     Temp 98.3 F (36.8 C)     Temp Source Oral     SpO2 100 %     Weight      Height      Head Circumference      Peak Flow      Pain Score 3     Pain Loc      Pain Edu?      Excl. in GC?    No data found.  Updated Vital Signs BP 130/83 (BP Location: Right Arm)   Pulse 82   Temp 98.3 F (36.8 C) (Oral)   Resp 18   LMP 11/07/2021 (Exact Date)   SpO2 100%   Visual Acuity Right Eye Distance:   Left Eye Distance:   Bilateral Distance:    Right Eye Near:   Left Eye Near:    Bilateral Near:     Physical Exam Vitals and nursing note reviewed.  Constitutional:      General: She is not in acute distress.    Appearance:  She is well-developed.  HENT:     Head: Normocephalic and atraumatic.  Eyes:     Conjunctiva/sclera: Conjunctivae normal.  Cardiovascular:     Rate and Rhythm: Normal rate and  regular rhythm.     Heart sounds: No murmur heard. Pulmonary:     Effort: Pulmonary effort is normal. No respiratory distress.     Breath sounds: Normal breath sounds.  Abdominal:     Palpations: Abdomen is soft.     Tenderness: There is no abdominal tenderness.  Musculoskeletal:        General: Swelling present.     Cervical back: Neck supple.     Left foot: Normal pulse.  Skin:    General: Skin is warm and dry.     Capillary Refill: Capillary refill takes less than 2 seconds.     Findings: Bruising present.     Comments: Moderate bruising to anterior aspect left fourth and fifth digits of foot. No vascular compromise evident on physical exam. DP bilaterally +2.   Neurological:     Mental Status: She is alert.  Psychiatric:        Mood and Affect: Mood normal.     UC Treatments / Results  Labs (all labs ordered are listed, but only abnormal results are displayed) Labs Reviewed - No data to display  EKG   Radiology No results found.  Procedures Procedures (including critical care time)  Medications Ordered in UC Medications - No data to display  Initial Impression / Assessment and Plan / UC Course  I have reviewed the triage vital signs and the nursing notes.  Pertinent labs & imaging results that were available during my care of the patient were reviewed by me and considered in my medical decision making (see chart for details).  X-ray of left foot obtained to investigate for acute bony abnormality was negative. Patient instructed to buddy tape her toes for increased support and wear shoes that are comfortable/supportive and do not cause pain. 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 Patient instructed to stay home from work tomorrow to rest, ice, and elevate left lower extremity for the next few days. Patient agrees to this and work note given. Next shift at work Wednesday 11/15/2021. Instructed to follow-up with primary care provider and return here for  new or worsening symptoms. Patient agrees to this plan and verbalizes understanding of instructions.   Final Clinical Impressions(s) / UC Diagnoses   Final diagnoses:  Toe pain, left     Discharge Instructions      Your x-ray today of your left foot was negative for any acute fracture or bony abnormality.  Continue to use Aleve and Tylenol for swelling, inflammation, and pain.  Continue to ice and elevate your left foot to reduce swelling.  I expect your toes will heal in the next few days and begin to feel better.  Please return if you experience sudden worsening in symptoms and follow-up with your primary care provider for further evaluation and management.     ED Prescriptions   None    PDMP not reviewed this encounter.   Carlisle Beers, FNP 11/15/21 0819    Carlisle Beers, FNP 11/15/21 [phone]

## 2021-11-12 NOTE — Discharge Instructions (Addendum)
Your x-ray today of your left foot was negative for any acute fracture or bony abnormality.  Continue to use Aleve and Tylenol for swelling, inflammation, and pain.  Continue to ice and elevate your left foot to reduce swelling.  I expect your toes will heal in the next few days and begin to feel better. ? ?Please return if you experience sudden worsening in symptoms and follow-up with your primary care provider for further evaluation and management. ?

## 2021-11-12 NOTE — ED Triage Notes (Signed)
Hit left 4th and 5th toe on a concrete bumper yesterday.  Hurts to put a shoe on to walk. ?

## 2021-11-18 ENCOUNTER — Other Ambulatory Visit (HOSPITAL_COMMUNITY): Payer: Self-pay

## 2021-11-20 ENCOUNTER — Other Ambulatory Visit: Payer: Self-pay | Admitting: Physician Assistant

## 2021-11-20 ENCOUNTER — Other Ambulatory Visit (HOSPITAL_COMMUNITY): Payer: Self-pay

## 2021-11-20 DIAGNOSIS — M0579 Rheumatoid arthritis with rheumatoid factor of multiple sites without organ or systems involvement: Secondary | ICD-10-CM

## 2021-11-20 MED ORDER — ENBREL MINI 50 MG/ML ~~LOC~~ SOCT
50.0000 mg | SUBCUTANEOUS | 0 refills | Status: DC
  Filled ????-??-??: fill #0

## 2021-11-20 NOTE — Telephone Encounter (Signed)
Next Visit: 02/06/2022 ?  ?Last Visit: 09/06/2021 ?  ?Last Fill: 09/15/2021 ? ?Dx:  Rheumatoid arthritis involving multiple sites with positive rheumatoid factor  ? ?Current Dose per office note on 09/06/2021:  ? ?Labs: 09/06/2021 CBC WNL. Hgb and hct are borderline low. Rest of CBC WNL ? ?TB Gold: 06/29/2021 Neg  ? ?Okay to refill Enbrel?  ?

## 2021-11-22 ENCOUNTER — Other Ambulatory Visit (HOSPITAL_COMMUNITY): Payer: Self-pay

## 2021-11-22 ENCOUNTER — Other Ambulatory Visit: Payer: Self-pay | Admitting: Pharmacist

## 2021-11-22 DIAGNOSIS — M0579 Rheumatoid arthritis with rheumatoid factor of multiple sites without organ or systems involvement: Secondary | ICD-10-CM

## 2021-11-22 MED ORDER — ENBREL MINI 50 MG/ML ~~LOC~~ SOCT
50.0000 mg | SUBCUTANEOUS | 0 refills | Status: DC
  Filled 2021-11-22: qty 4, 28d supply, fill #0
  Filled 2021-12-21: qty 4, 28d supply, fill #1
  Filled 2022-01-16: qty 4, 28d supply, fill #2

## 2021-11-27 ENCOUNTER — Other Ambulatory Visit (HOSPITAL_COMMUNITY): Payer: Self-pay

## 2021-11-29 ENCOUNTER — Telehealth: Payer: Self-pay | Admitting: Rheumatology

## 2021-11-29 NOTE — Telephone Encounter (Signed)
Patient called the office stating she stumped her toe 3 weeks ago. Patient states she went to urgent care right after where x-rays were done and she was told it was not broken. Patient states it is still swollen and bruised. Patient unsure if her RA is potentially making it worse or prolonging the pain and swelling. Patient wants to know if she should be seen by Dr. Estanislado Pandy or her PCP.  ?

## 2021-11-29 NOTE — Telephone Encounter (Signed)
Left message to encourage patient to follow up with orthopedics for further evaluation.   ?

## 2021-11-29 NOTE — Telephone Encounter (Signed)
Please encourage the patient to follow up with orthopedics for further evaluation.

## 2021-11-30 ENCOUNTER — Ambulatory Visit: Payer: 59 | Admitting: Physician Assistant

## 2021-11-30 DIAGNOSIS — M79676 Pain in unspecified toe(s): Secondary | ICD-10-CM | POA: Insufficient documentation

## 2021-11-30 DIAGNOSIS — M79675 Pain in left toe(s): Secondary | ICD-10-CM | POA: Diagnosis not present

## 2021-11-30 DIAGNOSIS — M79672 Pain in left foot: Secondary | ICD-10-CM | POA: Diagnosis not present

## 2021-11-30 NOTE — Progress Notes (Signed)
? ?Office Visit Note ?  ?Patient: Karen Burton           ?Date of Birth: [birth year]           ?MRN: [id number] ?Visit Date: 11/30/2021 ?             ?Requested by: Coral Spikes, DO ?[address] ?[address] ?[place],  [place] [address] ?PCP: Coral Spikes, DO ? ?Chief Complaint  ?Patient presents with  ? Left Foot - Follow-up, Injury, Pain  ?  Pain in the 4th toe ?DOI 11/11/21  ? ? ? ? ?HPI: ?Patient is a pleasant 46 year old woman with a 3-week history of left fourth toe pain and swelling and bruising after hitting it against a cement block.  She was seen and evaluated in urgent care x-rays were negative.  She comes in today with continued pain in the left fourth toe.  She is wearing crocs was not told to wear a stiff shoe.  She was buddy taping but found that to be more painful ? ?Assessment & Plan: ?Visit Diagnoses: No diagnosis found. ? ?Plan: Left fourth toe contusion cannot rule out nondisplaced fracture.  I have given her a Darco shoe to try to offload the front of her foot.  Also offered a stiff soled shoe would help for her when she is at work.  She will follow-up in 3 weeks to see if she is improved if she is doing well she may cancel this appointment provided reassurance that this can take a while to feel better ? ?Follow-Up Instructions: No follow-ups on file.  ? ?Ortho Exam ? ?Patient is alert, oriented, no adenopathy, well-dressed, normal affect, normal respiratory effort. ?She has palpable pulses she has no foot swelling she has some swelling isolated to the fourth left toe where she also has some discoloration but brisk capillary refill in the toe is warm.  No obvious deformity.  X-rays do not show an obvious fracture ? ?Imaging: ?No results found. ?No images are attached to the encounter. ? ?Labs: ?Lab Results  ?Component Value Date  ? ESRSEDRATE 20 06/29/2021  ? ESRSEDRATE 47 (H) 11/02/2020  ? CRP 10 06/29/2021  ? GRAMSTAIN No WBC Seen 02/20/2013  ? GRAMSTAIN No Squamous Epithelial Cells Seen  02/20/2013  ? GRAMSTAIN Rare Gram Positive Cocci In Clusters 02/20/2013  ? LABORGA METHICILLIN RESISTANT STAPHYLOCOCCUS AUREUS 02/20/2013  ? ? ? ?Lab Results  ?Component Value Date  ? ALBUMIN 4.2 05/18/2021  ? ALBUMIN 4.1 02/09/2021  ? ALBUMIN 4.1 11/02/2020  ? ? ?No results found for: MG ?No results found for: VD25OH ? ?No results found for: PREALBUMIN ? ?  Latest Ref Rng & Units 09/06/2021  ?  8:58 AM 06/29/2021  ?  9:40 AM 05/18/2021  ? 11:04 AM  ?CBC EXTENDED  ?WBC 3.8 - 10.8 Thousand/uL 5.3   5.2   4.8    ?RBC 3.80 - 5.10 Million/uL 3.82   4.39   4.27    ?Hemoglobin 11.7 - 15.5 g/dL 11.2   13.0   12.7    ?HCT 35.0 - 45.0 % 34.2   38.8   37.8    ?Platelets 140 - 400 Thousand/uL 248   265   268    ?NEUT# 1,500 - 7,800 cells/uL 2,889   2.8   2.7    ?Lymph# 850 - 3,900 cells/uL 1,585   1.7   1.5    ? ? ? ?There is no height or weight on file to calculate BMI. ? ?  Orders:  ?No orders of the defined types were placed in this encounter. ? ?No orders of the defined types were placed in this encounter. ? ? ? Procedures: ?No procedures performed ? ?Clinical Data: ?No additional findings. ? ?ROS: ? ?All other systems negative, except as noted in the HPI. ?Review of Systems ? ?Objective: ?Vital Signs: LMP 11/07/2021 (Exact Date)  ? ?Specialty Comments:  ?No specialty comments available. ? ?PMFS History: ?Patient Active Problem List  ? Diagnosis Date Noted  ? Insomnia 09/06/2021  ? Fibromyalgia 11/06/2016  ? Uterine leiomyoma 12/20/2015  ? Essential hypertension 01/12/2013  ? Esophageal reflux 01/12/2013  ? Rheumatoid arthritis ([place]) 01/12/2013  ? ?Past Medical History:  ?Diagnosis Date  ? Anemia   ? Arthritis   ? Dysmenorrhea 12/20/2015  ? Eczema   ? mild  ? Fibroids 12/20/2015  ? Fibromyalgia   ? Menorrhagia with regular cycle 12/20/2015  ? Rash   ? Reflux   ? Rhinitis   ? chonic  ? Vitamin D deficiency   ? Vulvar itching 12/20/2015  ?  ?Family History  ?Problem Relation Age of Onset  ? Diabetes Mother   ? Hypertension Mother   ?  Asthma Mother   ? Cancer Mother   ?     breast  ? Thyroid disease Mother   ? Arthritis Mother   ? Graves' disease Mother   ? Cancer Maternal Grandmother   ?     pancreatic  ? Fibroids Sister   ? COPD Maternal Aunt   ? Asthma Maternal Aunt   ? Gout Maternal Aunt   ?  ?Past Surgical History:  ?Procedure Laterality Date  ? ESOPHAGOGASTRODUODENOSCOPY ENDOSCOPY    ? IR GENERIC HISTORICAL  02/07/2016  ? IR RADIOLOGIST EVAL & MGMT 02/07/2016 [name], MD [facility]  ? WISDOM TOOTH EXTRACTION    ? ?Social History  ? ?Occupational History  ? Not on file  ?Tobacco Use  ? Smoking status: Never  ?  Passive exposure: Never  ? Smokeless tobacco: Never  ?Vaping Use  ? Vaping Use: Never used  ?Substance and Sexual Activity  ? Alcohol use: No  ? Drug use: Never  ? Sexual activity: Not Currently  ?  Birth control/protection: Pill  ? ? ? ? ? ?

## 2021-12-08 ENCOUNTER — Other Ambulatory Visit (HOSPITAL_COMMUNITY): Payer: Self-pay

## 2021-12-19 ENCOUNTER — Other Ambulatory Visit (HOSPITAL_COMMUNITY): Payer: Self-pay

## 2021-12-20 ENCOUNTER — Other Ambulatory Visit (HOSPITAL_COMMUNITY): Payer: Self-pay

## 2021-12-21 ENCOUNTER — Other Ambulatory Visit (HOSPITAL_COMMUNITY): Payer: Self-pay

## 2021-12-21 ENCOUNTER — Ambulatory Visit: Payer: 59 | Admitting: Physician Assistant

## 2021-12-21 ENCOUNTER — Ambulatory Visit: Payer: Self-pay

## 2021-12-21 DIAGNOSIS — M79672 Pain in left foot: Secondary | ICD-10-CM | POA: Diagnosis not present

## 2021-12-21 NOTE — Progress Notes (Signed)
Office Visit Note   Patient: Karen Burton           Date of Birth: 11/06/1975           MRN: [id number] Visit Date: 12/21/2021              Requested by: Coral Spikes, DO [place],  [place] [address] PCP: Coral Spikes, DO  Chief Complaint  Patient presents with   Left Foot - Pain      HPI: Patient is a pleasant 46 year old woman who follows up today for her left fourth toe pain.  She is now almost 6 weeks status post hitting her left fourth toe on cement.  She did go to an urgent care where x-rays were negative for fracture.  She followed up with me with bruising and pain in the left fourth toe.  I did place her in a Darco shoe.  She does says she is slightly better.  She does admit she went to a graduation over the weekend and did quite a bit of walking and had a lot of bit increase in her pain.  She says buddy taping helps as well  Assessment & Plan: Visit Diagnoses:  1. Left foot pain     Plan: Left fourth toe.  On today's x-rays I can appreciate a possible nondisplaced fracture over the distal end of the fourth middle phalanx.  Nondisplaced.  I would like for her to really try hard to wear the shoe.  Her swelling is gone down and this is reassuring.  We also talked about supplementing with 5000 international units of vitamin D daily to heal this fracture.  She will follow-up in 3 weeks  Follow-Up Instructions: No follow-ups on file.   Ortho Exam  Patient is alert, oriented, no adenopathy, well-dressed, normal affect, normal respiratory effort.  Is warm with palpable dorsalis pedis pulses no redness no cellulitis she does have some tenderness over the distal end of the proximal phalanx of the fourth toe.  Brisk capillary refill sensation intact   Imaging: No results found. No images are attached to the encounter.  Labs: Lab Results  Component Value Date   ESRSEDRATE 20 06/29/2021   ESRSEDRATE 47 (H) 11/02/2020   CRP 10 06/29/2021    GRAMSTAIN No WBC Seen 02/20/2013   GRAMSTAIN No Squamous Epithelial Cells Seen 02/20/2013   GRAMSTAIN Rare Gram Positive Cocci In Clusters 02/20/2013   LABORGA METHICILLIN RESISTANT STAPHYLOCOCCUS AUREUS 02/20/2013     Lab Results  Component Value Date   ALBUMIN 4.2 05/18/2021   ALBUMIN 4.1 02/09/2021   ALBUMIN 4.1 11/02/2020    No results found for: MG No results found for: VD25OH  No results found for: PREALBUMIN    Latest Ref Rng & Units 09/06/2021    8:58 AM 06/29/2021    9:40 AM 05/18/2021   11:04 AM  CBC EXTENDED  WBC 3.8 - 10.8 Thousand/uL 5.3   5.2   4.8    RBC 3.80 - 5.10 Million/uL 3.82   4.39   4.27    Hemoglobin 11.7 - 15.5 g/dL 11.2   13.0   12.7    HCT 35.0 - 45.0 % 34.2   38.8   37.8    Platelets 140 - 400 Thousand/uL 248   265   268    NEUT# 1,500 - 7,800 cells/uL 2,889   2.8   2.7    Lymph# 850 - 3,900 cells/uL 1,585   1.7  1.5       There is no height or weight on file to calculate BMI.  Orders:  Orders Placed This Encounter  Procedures   XR Foot Complete Left   No orders of the defined types were placed in this encounter.    Procedures: No procedures performed  Clinical Data: No additional findings.  ROS:  All other systems negative, except as noted in the HPI. Review of Systems  Objective: Vital Signs: There were no vitals taken for this visit.  Specialty Comments:  No specialty comments available.  PMFS History: Patient Active Problem List   Diagnosis Date Noted   Toe pain 11/30/2021   Insomnia 09/06/2021   Fibromyalgia 11/06/2016   Uterine leiomyoma 12/20/2015   Essential hypertension 01/12/2013   Esophageal reflux 01/12/2013   Rheumatoid arthritis ([place]) 01/12/2013   Past Medical History:  Diagnosis Date   Anemia    Arthritis    Dysmenorrhea 12/20/2015   Eczema    mild   Fibroids 12/20/2015   Fibromyalgia    Menorrhagia with regular cycle 12/20/2015   Rash    Reflux    Rhinitis    chonic   Vitamin D deficiency     Vulvar itching 12/20/2015    Family History  Problem Relation Age of Onset   Diabetes Mother    Hypertension Mother    Asthma Mother    Cancer Mother        breast   Thyroid disease Mother    Arthritis Mother    Berenice Primas' disease Mother    Cancer Maternal Grandmother        pancreatic   Fibroids Sister    COPD Maternal Aunt    Asthma Maternal Aunt    Gout Maternal Aunt     Past Surgical History:  Procedure Laterality Date   ESOPHAGOGASTRODUODENOSCOPY ENDOSCOPY     IR GENERIC HISTORICAL  02/07/2016   IR RADIOLOGIST EVAL & MGMT 02/07/2016 [name], MD [facility]   WISDOM TOOTH EXTRACTION     Social History   Occupational History   Not on file  Tobacco Use   Smoking status: Never    Passive exposure: Never   Smokeless tobacco: Never  Vaping Use   Vaping Use: Never used  Substance and Sexual Activity   Alcohol use: No   Drug use: Never   Sexual activity: Not Currently    Birth control/protection: Pill

## 2021-12-27 ENCOUNTER — Other Ambulatory Visit (HOSPITAL_COMMUNITY): Payer: Self-pay

## 2022-01-04 ENCOUNTER — Encounter (HOSPITAL_COMMUNITY): Payer: Self-pay | Admitting: Emergency Medicine

## 2022-01-04 ENCOUNTER — Emergency Department (HOSPITAL_COMMUNITY): Payer: 59

## 2022-01-04 ENCOUNTER — Other Ambulatory Visit (HOSPITAL_COMMUNITY): Payer: Self-pay

## 2022-01-04 ENCOUNTER — Emergency Department (HOSPITAL_COMMUNITY)
Admission: EM | Admit: 2022-01-04 | Discharge: 2022-01-04 | Disposition: A | Discharge status: Home / Self Care | Payer: 59 | Attending: Emergency Medicine | Admitting: Emergency Medicine

## 2022-01-04 DIAGNOSIS — R079 Chest pain, unspecified: Secondary | ICD-10-CM | POA: Insufficient documentation

## 2022-01-04 DIAGNOSIS — R0689 Other abnormalities of breathing: Secondary | ICD-10-CM | POA: Diagnosis not present

## 2022-01-04 DIAGNOSIS — R1011 Right upper quadrant pain: Secondary | ICD-10-CM | POA: Insufficient documentation

## 2022-01-04 DIAGNOSIS — R0789 Other chest pain: Secondary | ICD-10-CM | POA: Diagnosis not present

## 2022-01-04 DIAGNOSIS — R1013 Epigastric pain: Secondary | ICD-10-CM | POA: Insufficient documentation

## 2022-01-04 DIAGNOSIS — R52 Pain, unspecified: Secondary | ICD-10-CM | POA: Diagnosis not present

## 2022-01-04 LAB — BASIC METABOLIC PANEL
Anion gap: 11 (ref 5–15)
BUN: 17 mg/dL (ref 6–20)
CO2: 23 mmol/L (ref 22–32)
Calcium: 8.7 mg/dL — ABNORMAL LOW (ref 8.9–10.3)
Chloride: 104 mmol/L (ref 98–111)
Creatinine, Ser: 0.93 mg/dL (ref 0.44–1.00)
GFR, Estimated: >60 mL/min (ref >60)
Glucose, Bld: 112 mg/dL — ABNORMAL HIGH (ref 70–99)
Potassium: 3.5 mmol/L (ref 3.5–5.1)
Sodium: 138 mmol/L (ref 135–145)

## 2022-01-04 LAB — CBC
HCT: 36.2 % (ref 36.0–46.0)
Hemoglobin: 11.6 g/dL — ABNORMAL LOW (ref 12.0–15.0)
MCH: 28.8 pg (ref 26.0–34.0)
MCHC: 32 g/dL (ref 30.0–36.0)
MCV: 89.8 fL (ref 80.0–100.0)
Platelets: 272 10*3/uL (ref 150–400)
RBC: 4.03 MIL/uL (ref 3.87–5.11)
RDW: 13.3 % (ref 11.5–15.5)
WBC: 8.4 10*3/uL (ref 4.0–10.5)
nRBC: 0 % (ref 0.0–0.2)

## 2022-01-04 LAB — HEPATIC FUNCTION PANEL
ALT: 29 U/L (ref 0–44)
AST: 35 U/L (ref 15–41)
Albumin: 3.4 g/dL — ABNORMAL LOW (ref 3.5–5.0)
Alkaline Phosphatase: 75 U/L (ref 38–126)
Bilirubin, Direct: 0.1 mg/dL (ref 0.0–0.2)
Indirect Bilirubin: 0.5 mg/dL (ref 0.3–0.9)
Total Bilirubin: 0.6 mg/dL (ref 0.3–1.2)
Total Protein: 7.3 g/dL (ref 6.5–8.1)

## 2022-01-04 LAB — TROPONIN I (HIGH SENSITIVITY)
Troponin I (High Sensitivity): 2 ng/L (ref <18)
Troponin I (High Sensitivity): 3 ng/L (ref <18)

## 2022-01-04 LAB — LIPASE, BLOOD: Lipase: 29 U/L (ref 11–51)

## 2022-01-04 MED ORDER — MORPHINE SULFATE (PF) 4 MG/ML IV SOLN
4.0000 mg | Freq: Once | INTRAVENOUS | Status: AC
  Administered 2022-01-04: 4 mg via INTRAVENOUS
  Filled 2022-01-04: qty 1

## 2022-01-04 MED ORDER — PANTOPRAZOLE SODIUM 40 MG PO TBEC
40.0000 mg | DELAYED_RELEASE_TABLET | Freq: Every day | ORAL | 0 refills | Status: DC
  Filled 2022-01-04: qty 30, 30d supply, fill #0

## 2022-01-04 MED ORDER — ONDANSETRON HCL 4 MG/2ML IJ SOLN
4.0000 mg | Freq: Once | INTRAMUSCULAR | Status: AC
  Administered 2022-01-04: 4 mg via INTRAVENOUS
  Filled 2022-01-04: qty 2

## 2022-01-04 NOTE — Discharge Instructions (Signed)
Schedule follow-up with your doctor for a recheck.  You may benefit from an outpatient ultrasound of your gallbladder.

## 2022-01-04 NOTE — ED Provider Notes (Signed)
[facility] EMERGENCY DEPARTMENT Provider Note   CSN: [id number] Arrival date & time: 01/04/22  0252     History  Chief Complaint  Patient presents with   Chest Pain    Karen Burton is a 46 y.o. female.  Patient presents to the emergency department for evaluation of sharp pain in the center of her chest.  Patient reports that the pain woke her from sleep.  She went to bed feeling well.  No known heart problems.  Patient not experiencing shortness of breath but she does notice increased pain when she takes a deep breath and holds it in.  No associated vomiting, diarrhea.       Home Medications Prior to Admission medications   Medication Sig Start Date End Date Taking? Authorizing Provider  pantoprazole (PROTONIX) 40 MG tablet Take 1 tablet (40 mg total) by mouth daily. 01/04/22  Yes Leibish Mcgregor, Gwenyth Allegra, MD  acetaminophen (TYLENOL) 500 MG tablet Take 1,000 mg by mouth as needed.    [provider]  amLODipine (NORVASC) 5 MG tablet TAKE 1 TABLET BY MOUTH DAILY FOR BLOOD PRESSURE 09/06/21 09/06/22  Coral Spikes, DO  dicyclomine (BENTYL) 10 MG capsule Take 1 capsule (10 mg total) by mouth 4 (four) times daily -  before meals and at bedtime for 20 days. 09/01/21 09/24/21  Ameduite, Trenton Gammon, NP  docusate sodium (COLACE) 100 MG capsule Take 100 mg by mouth daily.     [provider]  DULoxetine (CYMBALTA) 60 MG capsule TAKE 1 CAPSULE BY MOUTH ONCE DAILY 10/02/21 10/02/22  Ofilia Neas, PA-C  Etanercept (ENBREL MINI) 50 MG/ML SOCT Inject 50 mg into the skin once a week. 11/22/21   Tresa Garter, MD  hydrochlorothiazide (HYDRODIURIL) 12.5 MG tablet TAKE 1 TABLET BY MOUTH DAILY. 09/06/21 09/06/22  Coral Spikes, DO  loperamide (IMODIUM) 2 MG capsule Take 1 capsule (2 mg total) by mouth as needed for diarrhea or loose stools (take 45 mins prior to eating and one time if you have diarrhea. Do not exceed 8 capsules per day). 06/29/21   Ameduite, Trenton Gammon, NP  losartan  (COZAAR) 50 MG tablet TAKE 1 TABLET BY MOUTH DAILY. 09/06/21   Coral Spikes, DO  Melatonin 5 MG CAPS Take by mouth at bedtime.    [provider]  Multiple Vitamins-Minerals (MULTIVITAMIN WOMEN PO) Take by mouth daily.    [provider]  Norethindrone Acetate-Ethinyl Estrad-FE (LOESTRIN 24 FE) 1-20 MG-MCG(24) tablet TAKE 1 TABLET BY MOUTH ONCE DAILY (USE NDC [address]-83) 01/02/21 03/19/22  Derrek Monaco A, NP  ondansetron (ZOFRAN-ODT) 8 MG disintegrating tablet Take 1 tablet (8 mg total) by mouth every 8 (eight) hours as needed for nausea or vomiting. 08/06/21   Hazel Sams, PA-C  traZODone (DESYREL) 50 MG tablet Take 1/2 tablet (25 mg total) by mouth at bedtime as needed. 09/06/21   Ofilia Neas, PA-C  triamcinolone (NASACORT) 55 MCG/ACT AERO nasal inhaler use 2 sprays each nostril daily Patient taking differently: Place into the nose as needed. 12/12/20     zolpidem (AMBIEN) 5 MG tablet Take 1 tablet (5 mg total) by mouth at bedtime as needed for sleep. 11/19/19   Estill Dooms, NP      Allergies    Ceftin [cefuroxime axetil], Humira [adalimumab], and Methotrexate derivatives    Review of Systems   Review of Systems  Physical Exam Updated Vital Signs BP 127/84   Pulse 76   Temp 97.8 F (36.6 C) (  Oral)   Resp 17   Ht '5\' 5"'$  (1.651 m)   Wt 86.2 kg   SpO2 96%   BMI 31.62 kg/m  Physical Exam Vitals and nursing note reviewed.  Constitutional:      General: She is not in acute distress.    Appearance: She is well-developed.  HENT:     Head: Normocephalic and atraumatic.     Mouth/Throat:     Mouth: Mucous membranes are moist.  Eyes:     General: Vision grossly intact. Gaze aligned appropriately.     Extraocular Movements: Extraocular movements intact.     Conjunctiva/sclera: Conjunctivae normal.  Cardiovascular:     Rate and Rhythm: Normal rate and regular rhythm.     Pulses: Normal pulses.     Heart sounds: Normal heart sounds, S1 normal and S2  normal. No murmur heard.    No friction rub. No gallop.  Pulmonary:     Effort: Pulmonary effort is normal. No respiratory distress.     Breath sounds: Normal breath sounds.  Abdominal:     General: Bowel sounds are normal.     Palpations: Abdomen is soft.     Tenderness: There is abdominal tenderness in the right upper quadrant and epigastric area. There is no guarding or rebound.     Hernia: No hernia is present.  Musculoskeletal:        General: No swelling.     Cervical back: Full passive range of motion without pain, normal range of motion and neck supple. No spinous process tenderness or muscular tenderness. Normal range of motion.     Right lower leg: No edema.     Left lower leg: No edema.  Skin:    General: Skin is warm and dry.     Capillary Refill: Capillary refill takes less than 2 seconds.     Findings: No ecchymosis, erythema, rash or wound.  Neurological:     General: No focal deficit present.     Mental Status: She is alert and oriented to person, place, and time.     GCS: GCS eye subscore is 4. GCS verbal subscore is 5. GCS motor subscore is 6.     Cranial Nerves: Cranial nerves 2-12 are intact.     Sensory: Sensation is intact.     Motor: Motor function is intact.     Coordination: Coordination is intact.  Psychiatric:        Attention and Perception: Attention normal.        Mood and Affect: Mood normal.        Speech: Speech normal.        Behavior: Behavior normal.     ED Results / Procedures / Treatments   Labs (all labs ordered are listed, but only abnormal results are displayed) Labs Reviewed  BASIC METABOLIC PANEL - Abnormal; Notable for the following components:      Result Value   Glucose, Bld 112 (*)    Calcium 8.7 (*)    All other components within normal limits  CBC - Abnormal; Notable for the following components:   Hemoglobin 11.6 (*)    All other components within normal limits  HEPATIC FUNCTION PANEL - Abnormal; Notable for the  following components:   Albumin 3.4 (*)    All other components within normal limits  LIPASE, BLOOD  POC URINE PREG, ED  TROPONIN I (HIGH SENSITIVITY)  TROPONIN I (HIGH SENSITIVITY)    EKG EKG Interpretation  Date/Time:  Thursday January 04 2022 02:56:05  EDT Ventricular Rate:  88 PR Interval:  110 QRS Duration: 92 QT Interval:  382 QTC Calculation: 463 R Axis:   60 Text Interpretation: Sinus rhythm Borderline short PR interval Borderline T abnormalities, anterior leads Confirmed by Orpah Greek [phone]) on 01/04/2022 5:46:45 AM  Radiology DG Chest 2 View  Result Date: 01/04/2022 CLINICAL DATA:  Chest pain for several hours, initial encounter EXAM: CHEST - 2 VIEW COMPARISON:  03/04/2018 FINDINGS: The heart size and mediastinal contours are within normal limits. Both lungs are clear. The visualized skeletal structures are unremarkable. IMPRESSION: No active cardiopulmonary disease. Electronically Signed   By: Inez Catalina M.D.   On: 01/04/2022 03:30    Procedures Procedures    Medications Ordered in ED Medications  ondansetron (ZOFRAN) injection 4 mg (4 mg Intravenous Given 01/04/22 0403)  morphine (PF) 4 MG/ML injection 4 mg (4 mg Intravenous Given 01/04/22 0403)    ED Course/ Medical Decision Making/ A&P                           Medical Decision Making Amount and/or Complexity of Data Reviewed Labs: ordered. Radiology: ordered.  Risk Prescription drug management.   Patient presents to the emergency department for evaluation of sharp pain in the center of her lower chest.  Pain woke her from sleep.  Pain very atypical for cardiac etiology.  She is felt to be low risk for cardiac disease.  EKG without changes.  Troponin negative x2.  Additional consideration would be gallbladder disease and gastritis/GERD.  Patient's pain is resolved with morphine.  LFTs, lipase normal.  Repeat examination reveals no tenderness.  Certainly no indication that she has acute  cholecystitis, cannot rule out biliary colic.  Discussed possibility of waiting until ultrasound was available and perform gallbladder ultrasound versus follow-up with PCP and having outpatient study.  She would prefer to go home and follow-up with her doctor which is reasonable.        Final Clinical Impression(s) / ED Diagnoses Final diagnoses:  Epigastric pain    Rx / DC Orders ED Discharge Orders          Ordered    pantoprazole (PROTONIX) 40 MG tablet  Daily        01/04/22 0707              Orpah Greek, MD 01/04/22 [phone]

## 2022-01-04 NOTE — ED Triage Notes (Signed)
Pt c/o center sharp chest pain that woke her up this morning. Pt given 324 ASA and 1 nitro by EMS.

## 2022-01-09 ENCOUNTER — Ambulatory Visit: Payer: 59 | Admitting: Family Medicine

## 2022-01-09 ENCOUNTER — Ambulatory Visit (HOSPITAL_COMMUNITY)
Admission: RE | Admit: 2022-01-09 | Discharge: 2022-01-09 | Disposition: A | Discharge status: Home / Self Care | Payer: 59 | Source: Ambulatory Visit | Attending: Family Medicine | Admitting: Family Medicine

## 2022-01-09 ENCOUNTER — Other Ambulatory Visit: Payer: Self-pay | Admitting: Physician Assistant

## 2022-01-09 ENCOUNTER — Encounter: Payer: Self-pay | Admitting: Family Medicine

## 2022-01-09 ENCOUNTER — Other Ambulatory Visit (HOSPITAL_COMMUNITY): Payer: Self-pay

## 2022-01-09 VITALS — BP 126/66 | HR 69 | Temp 98.0°F | Wt 192.4 lb

## 2022-01-09 DIAGNOSIS — K8020 Calculus of gallbladder without cholecystitis without obstruction: Secondary | ICD-10-CM | POA: Diagnosis not present

## 2022-01-09 DIAGNOSIS — R1011 Right upper quadrant pain: Secondary | ICD-10-CM | POA: Insufficient documentation

## 2022-01-09 DIAGNOSIS — R112 Nausea with vomiting, unspecified: Secondary | ICD-10-CM | POA: Diagnosis not present

## 2022-01-09 DIAGNOSIS — R1013 Epigastric pain: Secondary | ICD-10-CM | POA: Diagnosis not present

## 2022-01-09 DIAGNOSIS — R197 Diarrhea, unspecified: Secondary | ICD-10-CM | POA: Diagnosis not present

## 2022-01-09 MED ORDER — DULOXETINE HCL 60 MG PO CPEP
ORAL_CAPSULE | Freq: Every day | ORAL | 2 refills | Status: DC
Start: 2022-01-09 — End: 2022-04-05
  Filled 2022-01-09: qty 30, 30d supply, fill #0
  Filled 2022-02-04: qty 30, 30d supply, fill #1
  Filled 2022-03-07: qty 30, 30d supply, fill #2

## 2022-01-09 NOTE — Assessment & Plan Note (Signed)
Stat ultrasound was obtained today for further evaluation.  It has returned and has revealed cholelithiasis as well as gallbladder sludge.  Thickened gallbladder wall.  Placing urgent referral to general surgery.  Needs cholecystectomy.

## 2022-01-09 NOTE — Progress Notes (Signed)
Subjective:  Patient ID: Karen Burton, female    DOB: 26-Nov-1975  Age: 46 y.o. MRN: [id number]  CC: Chief Complaint  Patient presents with   ER Follow up    Pt went to [facility] ED on 01/04/22 due to sharp pain on left side (shoulder, chest,abdomen), dizzy, hot and clammy, vomited and felt faint. Pt was placed on Protonix by hospital doctor. ER Dr placed pt on Protonix. Unsure if this is GERD or Gallbladder. Pt has been limiting what she is eating due to having pain with eating     HPI:  46 year old female presents for follow-up.  Patient recently seen in the ER on 6/8.  Presented with abdominal pain, chest pain, and pain to the left shoulder and upper back.  She had some nausea and diaphoresis.  Had a thorough work-up in the ER.  No evidence of cardiac issues.  Patient was discharged home on Protonix.  Patient states that she continues to be symptomatic.  She has epigastric and right upper quadrant discomfort particular with eating.  She is not able to eat her normal foods as it triggers symptoms.  She reports that she has had nausea as well.  No recent vomiting.  No fever.  She endorses compliance with Protonix.  Patient Active Problem List   Diagnosis Date Noted   RUQ pain 01/09/2022   Insomnia 09/06/2021   Fibromyalgia 11/06/2016   Uterine leiomyoma 12/20/2015   Essential hypertension 01/12/2013   Esophageal reflux 01/12/2013   Rheumatoid arthritis ([place]) 01/12/2013    Social Hx   Social History   Socioeconomic History   Marital status: Single    Spouse name: Not on file   Number of children: 0   Years of education: Not on file   Highest education level: Not on file  Occupational History   Not on file  Tobacco Use   Smoking status: Never    Passive exposure: Never   Smokeless tobacco: Never  Vaping Use   Vaping Use: Never used  Substance and Sexual Activity   Alcohol use: No   Drug use: Never   Sexual activity: Not Currently    Birth  control/protection: Pill  Other Topics Concern   Not on file  Social History Narrative   Not on file   Social Determinants of Health   Financial Resource Strain: Low Risk  (01/02/2021)   Overall Financial Resource Strain (CARDIA)    Difficulty of Paying Living Expenses: Not hard at all  Food Insecurity: No Food Insecurity (01/02/2021)   Hunger Vital Sign    Worried About Running Out of Food in the Last Year: Never true    Ran Out of Food in the Last Year: Never true  Transportation Needs: No Transportation Needs (01/02/2021)   PRAPARE - Hydrologist (Medical): No    Lack of Transportation (Non-Medical): No  Physical Activity: Insufficiently Active (01/02/2021)   Exercise Vital Sign    Days of Exercise per Week: 1 day    Minutes of Exercise per Session: 20 min  Stress: No Stress Concern Present (01/02/2021)   [place]    Feeling of Stress : Only a little  Social Connections: Socially Isolated (01/02/2021)   Social Connection and Isolation Panel [NHANES]    Frequency of Communication with Friends and Family: More than three times a week    Frequency of Social Gatherings with Friends and Family: More than three times a week  Attends Religious Services: Never    Active Member of Clubs or Organizations: No    Attends Archivist Meetings: Never    Marital Status: Never married    Review of Systems Per HPI  Objective:  BP 126/66   Pulse 69   Temp 98 F (36.7 C)   Wt 192 lb 6.4 oz (87.3 kg)   SpO2 99%   BMI 32.02 kg/m      01/09/2022   10:20 AM 01/04/2022    7:00 AM 01/04/2022    6:30 AM  BP/Weight  Systolic BP 256 389 373  Diastolic BP 66 84 65  Wt. (Lbs) 192.4    BMI 32.02 kg/m2      Physical Exam Vitals and nursing note reviewed.  Constitutional:      General: She is not in acute distress.    Appearance: Normal appearance. She is not ill-appearing.  Eyes:      General: No scleral icterus.       Right eye: No discharge.        Left eye: No discharge.     Conjunctiva/sclera: Conjunctivae normal.  Cardiovascular:     Rate and Rhythm: Normal rate and regular rhythm.  Pulmonary:     Effort: Pulmonary effort is normal.     Breath sounds: Normal breath sounds. No wheezing, rhonchi or rales.  Abdominal:     Comments: Mild epigastric tenderness to palpation.  Marked right upper quadrant tenderness to palpation.  Neurological:     Mental Status: She is alert.  Psychiatric:        Mood and Affect: Mood normal.        Behavior: Behavior normal.     Lab Results  Component Value Date   WBC 8.4 01/04/2022   HGB 11.6 (L) 01/04/2022   HCT 36.2 01/04/2022   PLT 272 01/04/2022   GLUCOSE 112 (H) 01/04/2022   CHOL 135 02/09/2021   TRIG 91 02/09/2021   HDL 44 02/09/2021   LDLCALC 74 02/09/2021   ALT 29 01/04/2022   AST 35 01/04/2022   NA 138 01/04/2022   K 3.5 01/04/2022   CL 104 01/04/2022   CREATININE 0.93 01/04/2022   BUN 17 01/04/2022   CO2 23 01/04/2022   TSH 1.390 11/19/2019     Assessment & Plan:   Problem List Items Addressed This Visit       Other   RUQ pain - Primary    Stat ultrasound was obtained today for further evaluation.  It has returned and has revealed cholelithiasis as well as gallbladder sludge.  Thickened gallbladder wall.  Placing urgent referral to general surgery.  Needs cholecystectomy.      Relevant Orders   US Abdomen Limited RUQ (LIVER/GB) (Completed)   Ambulatory referral to General Surgery   Other Visit Diagnoses     Epigastric pain       Relevant Orders   US Abdomen Limited RUQ (LIVER/GB) (Completed)   Calculus of gallbladder without cholecystitis without obstruction       Relevant Orders   Ambulatory referral to [place]

## 2022-01-09 NOTE — Telephone Encounter (Signed)
Lab work can be repeated in September 2023.  She will require a CBC with differential and CMP with GFR.  [facility] is up-to-date.

## 2022-01-09 NOTE — Patient Instructions (Signed)
We will call with [place] results.  Continue protonix.  Take care  Dr. Lacinda Axon

## 2022-01-09 NOTE — Telephone Encounter (Signed)
Next Visit: 02/06/2022   Last Visit: 09/06/2021   Last Fill: 10/02/2021   Dx: Fibromyalgia   Current Dose per office note on 09/06/2021: not discussed   Okay to refill Cymbalta?

## 2022-01-10 ENCOUNTER — Other Ambulatory Visit (HOSPITAL_COMMUNITY): Payer: Self-pay

## 2022-01-12 ENCOUNTER — Other Ambulatory Visit: Payer: Self-pay | Admitting: Family Medicine

## 2022-01-12 ENCOUNTER — Encounter: Payer: Self-pay | Admitting: Family Medicine

## 2022-01-12 DIAGNOSIS — K8020 Calculus of gallbladder without cholecystitis without obstruction: Secondary | ICD-10-CM

## 2022-01-12 DIAGNOSIS — R1011 Right upper quadrant pain: Secondary | ICD-10-CM

## 2022-01-15 NOTE — Telephone Encounter (Signed)
Karen Spikes, DO     Please see if [facility] can get her seen ASAP by [facility].

## 2022-01-15 NOTE — Telephone Encounter (Signed)
Referral coordinator called [place] and their first available is on 6/27 at their [facility], The next available Appt in the [facility] is 7/3 - This Patient is currently scheduled with [facility] on 6/29- Patient notified via phone and stated she will keep appt with [facility] surgical on 01/25/22

## 2022-01-16 ENCOUNTER — Other Ambulatory Visit (HOSPITAL_COMMUNITY): Payer: Self-pay

## 2022-01-17 ENCOUNTER — Ambulatory Visit: Payer: 59 | Admitting: Orthopaedic Surgery

## 2022-01-17 ENCOUNTER — Encounter: Payer: Self-pay | Admitting: Orthopaedic Surgery

## 2022-01-17 ENCOUNTER — Telehealth: Payer: Self-pay

## 2022-01-17 NOTE — Telephone Encounter (Signed)
Error

## 2022-01-25 ENCOUNTER — Ambulatory Visit: Payer: 59 | Admitting: Surgery

## 2022-01-25 ENCOUNTER — Encounter: Payer: Self-pay | Admitting: Surgery

## 2022-01-25 VITALS — BP 121/84 | HR 83 | Temp 98.4°F | Resp 12 | Ht 65.0 in | Wt 191.0 lb

## 2022-01-25 DIAGNOSIS — K8020 Calculus of gallbladder without cholecystitis without obstruction: Secondary | ICD-10-CM

## 2022-01-25 NOTE — Progress Notes (Unsigned)
Office Visit Note  Patient: Karen Burton             Date of Birth: Mar 29, 1976           MRN: [id number]             PCP: Coral Spikes, DO Referring: Coral Spikes, DO Visit Date: 02/06/2022 Occupation: '@GUAROCC'$ @  Subjective:  Medication monitoring   History of Present Illness: Karen Burton is a 46 y.o. female with history of seropositive rheumatoid arthritis and fibromyalgia.  She is prescribed enbrel 50 mg sq injections once weekly.  She continues to tolerate Enbrel without any side effects and has not missed any doses recently.  She states she is scheduled for a lap cholecystectomy on 02/08/2022.  She has not been advised to hold her enbrel but states her last dose was last week.  She denies any signs or symptoms of a rheumatoid arthritis flare.  She continues to experience intermittent myalgias and muscle tenderness due to fibromyalgia.  She has trapezius muscle tension and tenderness bilaterally.  She experiences muscle spasms intermittently.  She has tried massage with minimal relief.  She has been trying to stay active to alleviate her symptoms.  She continues to have difficulty sleeping at night.  She has rarely been taking trazodone or Ambien to help her sleep.  She takes melatonin at bedtime as needed.  She was under the care of Dr. Durward Fortes for left fourth toe fracture.  She states that she was recently cleared from his care.  She has still been using buddy tape and wearing supportive shoes especially if she is walking prolonged distances. She denies any recent infections.     Activities of Daily Living:  Patient reports morning stiffness for 30 minutes.   Patient Denies nocturnal pain.  Difficulty dressing/grooming: Denies Difficulty climbing stairs: Denies Difficulty getting out of chair: Denies Difficulty using hands for taps, buttons, cutlery, and/or writing: Denies  Review of Systems  Constitutional:  Positive for fatigue.  HENT:  Positive for  mouth dryness and nose dryness. Negative for mouth sores.        Nose sores  Eyes:  Positive for dryness. Negative for pain, itching and visual disturbance.  Respiratory:  Negative for cough, hemoptysis, shortness of breath and difficulty breathing.   Cardiovascular:  Negative for chest pain, palpitations, hypertension and swelling in legs/feet.  Gastrointestinal:  Positive for constipation. Negative for blood in stool and diarrhea.  Endocrine: Negative for increased urination.  Genitourinary:  Negative for difficulty urinating and painful urination.  Musculoskeletal:  Positive for myalgias, muscle weakness, morning stiffness, muscle tenderness and myalgias. Negative for joint pain, joint pain and joint swelling.  Skin:  Negative for color change, pallor, rash, hair loss, nodules/bumps, redness, skin tightness, ulcers and sensitivity to sunlight.  Allergic/Immunologic: Negative for susceptible to infections.  Neurological:  Positive for numbness. Negative for dizziness, headaches, memory loss and weakness.  Hematological:  Positive for bruising/bleeding tendency. Negative for swollen glands.  Psychiatric/Behavioral:  Negative for depressed mood, confusion and sleep disturbance. The patient is not nervous/anxious.     PMFS History:  Patient Active Problem List   Diagnosis Date Noted   Closed fracture of phalanx of left fourth toe 01/31/2022   RUQ pain 01/09/2022   Insomnia 09/06/2021   Fibromyalgia 11/06/2016   Uterine leiomyoma 12/20/2015   Essential hypertension 01/12/2013   Esophageal reflux 01/12/2013   Rheumatoid arthritis ([place]) 01/12/2013    Past Medical History:  Diagnosis Date  Anemia    Dysmenorrhea 12/20/2015   Eczema    mild   Fibroids 12/20/2015   Fibromyalgia    GERD (gastroesophageal reflux disease)    Hypertension    Menorrhagia with regular cycle 12/20/2015   PONV (postoperative nausea and vomiting)    Rash    Reflux    Rheumatoid arthritis (HCC)    Rhinitis     chonic   Vitamin D deficiency    Vulvar itching 12/20/2015    Family History  Problem Relation Age of Onset   Diabetes Mother    Hypertension Mother    Asthma Mother    Cancer Mother        breast   Thyroid disease Mother    Arthritis Mother    Berenice Primas' disease Mother    Cancer Maternal Grandmother        pancreatic   Fibroids Sister    COPD Maternal Aunt    Asthma Maternal Aunt    Gout Maternal Aunt    Past Surgical History:  Procedure Laterality Date   ESOPHAGOGASTRODUODENOSCOPY ENDOSCOPY     IR GENERIC HISTORICAL  02/07/2016   IR RADIOLOGIST EVAL & MGMT 02/07/2016 [name], MD [facility] INTERV RAD   WISDOM TOOTH EXTRACTION     Social History   Social History Narrative   Not on file   Immunization History  Administered Date(s) Administered   DT (Pediatric) 02/20/2013   Influenza-Unspecified 07/18/2009, 05/16/2016, 06/03/2017, 04/07/2018, 04/27/2021   PFIZER(Purple Top)SARS-COV-2 Vaccination 04/01/2020, 04/22/2020   Pneumococcal Polysaccharide-23 05/08/2007     Objective: Vital Signs: BP 139/88 (BP Location: Left Arm, Patient Position: Sitting, Cuff Size: Normal)   Pulse 80   Ht '5\' 5"'$  (1.651 m)   Wt 190 lb 6.4 oz (86.4 kg)   LMP 12/09/2021   BMI 31.68 kg/m    Physical Exam Vitals and nursing note reviewed.  Constitutional:      Appearance: She is well-developed.  HENT:     Head: Normocephalic and atraumatic.  Eyes:     Conjunctiva/sclera: Conjunctivae normal.  Cardiovascular:     Rate and Rhythm: Normal rate and regular rhythm.     Heart sounds: Normal heart sounds.  Pulmonary:     Effort: Pulmonary effort is normal.     Breath sounds: Normal breath sounds.  Abdominal:     General: Bowel sounds are normal.     Palpations: Abdomen is soft.  Musculoskeletal:     Cervical back: Normal range of motion.  Skin:    General: Skin is warm and dry.     Capillary Refill: Capillary refill takes less than 2 seconds.  Neurological:     Mental Status: She is  alert and oriented to person, place, and time.  Psychiatric:        Behavior: Behavior normal.      Musculoskeletal Exam: C-spine thoracic spine, lumbar spine have good range of motion.  Trapezius muscle tension and tenderness bilaterally.  Shoulder joints, elbow joints, wrist joints, MCPs, PIPs, DIPs have good range of motion with no synovitis.  Complete fist formation bilaterally.  Hip joints have good range of motion with no groin pain.  Knee joints have good range of motion with no warmth or effusion.  Ankle joints have good range of motion with no tenderness or synovitis.  CDAI Exam: CDAI Score: 0.6  Patient Global: 3 mm; Provider Global: 3 mm Swollen: 0 ; Tender: 0  Joint Exam 02/06/2022   No joint exam has been documented for this visit  There is currently no information documented on the homunculus. Go to the Rheumatology activity and complete the homunculus joint exam.  Investigation: No additional findings.  Imaging: XR Foot Complete Left  Result Date: 01/31/2022 Radiographs of her left foot were reviewed today.  No fractures in the midfoot metatarsals.  Well-maintained alignment.  No signs of malalignment through the Lisfranc joint.  Cannot appreciate a fracture in the fourth toe however she does not have visual clear delineation between the distal phalanx and the middle phalanx.  It is present on her other toes.  US Abdomen Limited RUQ (LIVER/GB)  Result Date: 01/09/2022 CLINICAL DATA:  Right upper quadrant pain with nausea, vomiting and diarrhea over the last 5 days. EXAM: ULTRASOUND ABDOMEN LIMITED RIGHT UPPER QUADRANT COMPARISON:  Ultrasound 08/02/2010. FINDINGS: Gallbladder: The gallbladder contains multiple gallstones and gallbladder sludge. Wall thickening measuring 4.2 mm. No Murphy sign. Common bile duct: Diameter: 2.6 mm, normal. Liver: Mildly increased echogenicity suggesting fatty change. No focal lesion or ductal dilatation. Portal vein is patent on color Doppler  imaging with normal direction of blood flow towards the liver. Other: None. IMPRESSION: Cholelithiasis and gallbladder sludge. Gallbladder wall thickening. No definite Murphy sign. Cholecystitis is possible. No evidence of biliary obstruction. Mild fatty change of the liver. Electronically Signed   By: Nelson Chimes M.D.   On: 01/09/2022 11:49    Recent Labs: Lab Results  Component Value Date   WBC 8.4 01/04/2022   HGB 11.6 (L) 01/04/2022   PLT 272 01/04/2022   NA 138 01/04/2022   K 3.5 01/04/2022   CL 104 01/04/2022   CO2 23 01/04/2022   GLUCOSE 112 (H) 01/04/2022   BUN 17 01/04/2022   CREATININE 0.93 01/04/2022   BILITOT 0.6 01/04/2022   ALKPHOS 75 01/04/2022   AST 35 01/04/2022   ALT 29 01/04/2022   PROT 7.3 01/04/2022   ALBUMIN 3.4 (L) 01/04/2022   CALCIUM 8.7 (L) 01/04/2022   GFRAA 92 06/10/2020   QFTBGOLDPLUS Negative 06/29/2021    Speciality Comments: Prior therapy includes: methotrexate and Plaquenil (neutropenia) and Humira (painful injections)  Procedures:  No procedures performed Allergies: Ceftin [cefuroxime axetil], Humira [adalimumab], and Methotrexate derivatives  '   Assessment / Plan:     Visit Diagnoses: Rheumatoid arthritis involving multiple sites with positive rheumatoid factor ([place]): She has no joint tenderness or synovitis on examination today.  She has not had any signs or symptoms of a rheumatoid arthritis flare.  She is clinically doing well on Enbrel 50 mg subcutaneous injections once weekly.  She has not had any nocturnal pain or difficulty with ADLs.  Her morning stiffness has been lasting about 30 minutes daily.  Discussed the importance of remaining active and exercising on a regular basis.  She will remain on Enbrel as monotherapy.  She was advised to notify us if she develops increased joint pain or joint swelling.  She will follow-up in the office in 5 months or sooner if needed.  High risk medication use - Enbrel 50 mg sq injection once  weekly. CBC, BMP, and hepatic function panel updated on 01/04/22.  Her next lab work will be due in September and every 3 months to monitor for drug toxicity.  Standing orders for CBC and CMP were placed today. TB gold negative on 06/29/21 and will continue to be monitored yearly. She has not had any recent or recurrent infections.  Discussed the importance of holding enbrel if she develops signs or symptoms of an infection and to resume once  the infection has completely cleared.  She is scheduled for a laparoscopic cholecystomy on 02/08/22.  Patient was advised to hold her dose of Enbrel today.  She will require clearance during the postoperative period prior to resuming Enbrel.  She voiced understanding.  Fibromyalgia: She has generalized hyperalgesia and positive tender points on examination.  Trapezius muscle tension tenderness bilaterally.  She has been experiencing muscle spasms intermittently.  Prescription for methocarbamol 500 mg 1 tablet daily as needed for muscle spasms was sent to the pharmacy to be used during flares.  Instructions were provided along with potential side effects.  She continues to have difficulty sleeping at night and has been taking melatonin at bedtime.  She rarely takes trazodone or Ambien to help her sleep.  Discussed the importance of regular exercise and good sleep hygiene.  She is considering having massages on a maintenance basis to alleviate her muscle tension.  Other fatigue: Chronic, stable.  Discussed the importance of regular exercise.  History of insomnia: She has difficulty sleeping at night.  She is been taking melatonin at bedtime.  She rarely takes trazodone or Ambien.  Discussed the importance of good sleep hygiene.  Other medical conditions are listed as follows:  History of gastroesophageal reflux (GERD)  History of hypertension: Blood pressure was 139/80 today in the office.  Elevated LFTs: Within normal limits on 01/04/2022.  Elevated CK: No muscle  weakness at this time.  CK was 144 on 03/15/2020.  Orders: Orders Placed This Encounter  Procedures   CBC with Differential/Platelet   COMPLETE METABOLIC PANEL WITH GFR   Meds ordered this encounter  Medications   methocarbamol (ROBAXIN) 500 MG tablet    Sig: Take 1 tablet (500 mg total) by mouth daily as needed for muscle spasms.    Dispense:  20 tablet    Refill:  0      Follow-Up Instructions: Return in about 5 months (around 07/09/2022) for Rheumatoid arthritis, Fibromyalgia.   Ofilia Neas, PA-C  Note - This record has been created using Dragon software.  Chart creation errors have been sought, but may not always  have been located. Such creation errors do not reflect on  the standard of medical care.

## 2022-01-25 NOTE — Progress Notes (Addendum)
[facility] History and Physical  Reason for Referral: Cholelithiasis Referring Physician: Dr. Lacinda Axon  Chief Complaint   New Patient (Initial Visit)     Karen Burton is a 46 y.o. female.  HPI: Patient presents for evaluation of cholelithiasis.  She has been having worsening epigastric and right upper quadrant pain for the last month.  She did have a similar episode back in January.  She presented to the emergency department on 6/8, who recommended possible outpatient ultrasound and follow-up with PCP.  Her PCP ordered an abdominal ultrasound, which She underwent on 6/13 which demonstrated cholelithiasis with sludge, gallbladder wall thickening, no definitive Murphy sign.  She was given referral to follow-up with general surgery.  She states that she has been avoiding fatty foods, since her follow-up with her primary care doctor, she denies any further abdominal pain, nausea, or vomiting.  She has no history of abdominal surgeries.  She has a past medical history significant for hypertension and rheumatoid arthritis.  She denies use of blood thinning medications.  She denies use of tobacco products, alcohol, and illicit drugs.  Past Medical History:  Diagnosis Date   Anemia    Arthritis    Dysmenorrhea 12/20/2015   Eczema    mild   Fibroids 12/20/2015   Fibromyalgia    Menorrhagia with regular cycle 12/20/2015   Rash    Reflux    Rhinitis    chonic   Vitamin D deficiency    Vulvar itching 12/20/2015    Past Surgical History:  Procedure Laterality Date   ESOPHAGOGASTRODUODENOSCOPY ENDOSCOPY     IR GENERIC HISTORICAL  02/07/2016   IR RADIOLOGIST EVAL & MGMT 02/07/2016 [name], MD [facility] INTERV RAD   WISDOM TOOTH EXTRACTION      Family History  Problem Relation Age of Onset   Diabetes Mother    Hypertension Mother    Asthma Mother    Cancer Mother        breast   Thyroid disease Mother    Arthritis Mother    Berenice Primas' disease Mother    Cancer  Maternal Grandmother        pancreatic   Fibroids Sister    COPD Maternal Aunt    Asthma Maternal Aunt    Gout Maternal Aunt     Social History   Tobacco Use   Smoking status: Never    Passive exposure: Never   Smokeless tobacco: Never  Vaping Use   Vaping Use: Never used  Substance Use Topics   Alcohol use: No   Drug use: Never    Medications: I have reviewed the patient's current medications. Allergies as of 01/25/2022       Reactions   Ceftin [cefuroxime Axetil] Rash   Itchy Rash on neck, no hives   Humira [adalimumab] Other (See Comments)   Burning at injection site   Methotrexate Derivatives Other (See Comments)   Leukopenia        Medication List        Accurate as of January 25, 2022 10:48 AM. If you have any questions, ask your nurse or doctor.          acetaminophen 500 MG tablet Commonly known as: TYLENOL Take 1,000 mg by mouth as needed.   amLODipine 5 MG tablet Commonly known as: NORVASC TAKE 1 TABLET BY MOUTH DAILY FOR BLOOD PRESSURE   dicyclomine 10 MG capsule Commonly known as: BENTYL Take 1 capsule (10 mg total) by mouth 4 (four) times daily -  before meals and at bedtime for 20 days.   docusate sodium 100 MG capsule Commonly known as: COLACE Take 100 mg by mouth daily.   DULoxetine 60 MG capsule Commonly known as: CYMBALTA TAKE 1 CAPSULE BY MOUTH ONCE DAILY   Enbrel Mini 50 MG/ML Soct Generic drug: Etanercept Inject 50 mg into the skin once a week.   hydrochlorothiazide 12.5 MG tablet Commonly known as: HYDRODIURIL TAKE 1 TABLET BY MOUTH DAILY.   Junel Fe 24 1-20 MG-MCG(24) tablet Generic drug: Norethindrone Acetate-Ethinyl Estrad-FE TAKE 1 TABLET BY MOUTH ONCE DAILY (USE NDC [phone])   loperamide 2 MG capsule Commonly known as: IMODIUM Take 1 capsule (2 mg total) by mouth as needed for diarrhea or loose stools (take 45 mins prior to eating and one time if you have diarrhea. Do not exceed 8 capsules per day).    losartan 50 MG tablet Commonly known as: COZAAR TAKE 1 TABLET BY MOUTH DAILY.   Melatonin 5 MG Caps Take by mouth at bedtime.   MULTIVITAMIN WOMEN PO Take by mouth daily.   ondansetron 8 MG disintegrating tablet Commonly known as: ZOFRAN-ODT Take 1 tablet (8 mg total) by mouth every 8 (eight) hours as needed for nausea or vomiting.   pantoprazole 40 MG tablet Commonly known as: Protonix Take 1 tablet (40 mg total) by mouth daily.   traZODone 50 MG tablet Commonly known as: DESYREL Take 1/2 tablet (25 mg total) by mouth at bedtime as needed.   triamcinolone 55 MCG/ACT Aero nasal inhaler Commonly known as: NASACORT use 2 sprays each nostril daily What changed:  when to take this reasons to take this   zolpidem 5 MG tablet Commonly known as: AMBIEN Take 1 tablet (5 mg total) by mouth at bedtime as needed for sleep.         ROS:  Constitutional: negative for chills, fatigue, and fevers Eyes: negative for visual disturbance and pain Ears, nose, mouth, throat, and face: positive for sinus problems, negative for ear drainage and sore throat Respiratory: negative for cough, wheezing, and shortness of breath Cardiovascular: negative for chest pain and palpitations Gastrointestinal: positive for abdominal pain, nausea, and reflux symptoms Genitourinary:negative for dysuria, frequency, and urinary retention Integument/breast: negative for dryness and rash Hematologic/lymphatic: negative for bleeding and lymphadenopathy Musculoskeletal:positive for back pain, neck pain, and joint pain Neurological: negative for dizziness, tremors, and numbness Endocrine: negative for temperature intolerance  Blood pressure 121/84, pulse 83, temperature 98.4 F (36.9 C), temperature source Oral, resp. rate 12, height '5\' 5"'$  (1.651 m), weight 191 lb (86.6 kg), last menstrual period 12/09/2021, SpO2 96 %. Physical Exam Vitals reviewed.  Constitutional:      Appearance: Normal appearance.   HENT:     Head: Normocephalic and atraumatic.  Eyes:     Extraocular Movements: Extraocular movements intact.     Pupils: Pupils are equal, round, and reactive to light.  Cardiovascular:     Rate and Rhythm: Normal rate and regular rhythm.  Pulmonary:     Effort: Pulmonary effort is normal.     Breath sounds: Normal breath sounds.  Abdominal:     Comments: Abdomen soft, nondistended, no percussion tenderness, nontender to palpation; no rigidity, guarding, or rebound tenderness, negative Murphy's  Musculoskeletal:        General: Normal range of motion.     Cervical back: Normal range of motion.  Skin:    General: Skin is warm and dry.  Neurological:     General: No focal deficit present.  Mental Status: She is alert.  Psychiatric:        Behavior: Behavior normal.     Results: Abdominal US (01/09/22): IMPRESSION: Cholelithiasis and gallbladder sludge. Gallbladder wall thickening. No definite Murphy sign. Cholecystitis is possible. No evidence of biliary obstruction.   Mild fatty change of the liver.  Assessment & Plan:  Karen Burton is a 46 y.o. female who presents for cholelithiasis.  -I explained the pathophysiology of cholelithiasis and our reasons for recommending laparoscopic cholecystectomy -I counseled the patient about the indication, risks and benefits of laparoscopic cholecystectomy.  She understands there is a very small chance for bleeding, infection, injury to normal structures (including common bile duct), conversion to open surgery, persistent symptoms, evolution of postcholecystectomy diarrhea, need for secondary interventions, anesthesia reaction, cardiopulmonary issues and other risks not specifically detailed here. I described the expected recovery, the plan for follow-up and the restrictions during the recovery phase.  All questions were answered. -Information provided regarding surgery and low-fat diet -Patient tentatively scheduled for  surgery on 7/13  All questions were answered to the satisfaction of the patient.  [name], DO [facility] [address] [place], [place] [id number] [phone] (office)

## 2022-01-26 NOTE — H&P (Addendum)
[facility] History and Physical  Reason for Referral: Cholelithiasis Referring Physician: Dr. Lacinda Axon  Chief Complaint   New Patient (Initial Visit)     Karen Burton is a 46 y.o. female.  HPI: Patient presents for evaluation of cholelithiasis.  She has been having worsening epigastric and right upper quadrant pain for the last month.  She did have a similar episode back in January.  She presented to the emergency department on 6/8, who recommended possible outpatient ultrasound and follow-up with PCP.  Her PCP ordered an abdominal ultrasound, which She underwent on 6/13 which demonstrated cholelithiasis with sludge, gallbladder wall thickening, no definitive Murphy sign.  She was given referral to follow-up with general surgery.  She states that she has been avoiding fatty foods, since her follow-up with her primary care doctor, she denies any further abdominal pain, nausea, or vomiting.  She has no history of abdominal surgeries.  She has a past medical history significant for hypertension and rheumatoid arthritis.  She denies use of blood thinning medications.  She denies use of tobacco products, alcohol, and illicit drugs.  Past Medical History:  Diagnosis Date   Anemia    Arthritis    Dysmenorrhea 12/20/2015   Eczema    mild   Fibroids 12/20/2015   Fibromyalgia    Menorrhagia with regular cycle 12/20/2015   Rash    Reflux    Rhinitis    chonic   Vitamin D deficiency    Vulvar itching 12/20/2015    Past Surgical History:  Procedure Laterality Date   ESOPHAGOGASTRODUODENOSCOPY ENDOSCOPY     IR GENERIC HISTORICAL  02/07/2016   IR RADIOLOGIST EVAL & MGMT 02/07/2016 [name], MD [facility] INTERV RAD   WISDOM TOOTH EXTRACTION      Family History  Problem Relation Age of Onset   Diabetes Mother    Hypertension Mother    Asthma Mother    Cancer Mother        breast   Thyroid disease Mother    Arthritis Mother    Berenice Primas' disease Mother    Cancer  Maternal Grandmother        pancreatic   Fibroids Sister    COPD Maternal Aunt    Asthma Maternal Aunt    Gout Maternal Aunt     Social History   Tobacco Use   Smoking status: Never    Passive exposure: Never   Smokeless tobacco: Never  Vaping Use   Vaping Use: Never used  Substance Use Topics   Alcohol use: No   Drug use: Never    Medications: I have reviewed the patient's current medications. Allergies as of 01/25/2022       Reactions   Ceftin [cefuroxime Axetil] Rash   Itchy Rash on neck, no hives   Humira [adalimumab] Other (See Comments)   Burning at injection site   Methotrexate Derivatives Other (See Comments)   Leukopenia        Medication List        Accurate as of January 25, 2022 10:48 AM. If you have any questions, ask your nurse or doctor.          acetaminophen 500 MG tablet Commonly known as: TYLENOL Take 1,000 mg by mouth as needed.   amLODipine 5 MG tablet Commonly known as: NORVASC TAKE 1 TABLET BY MOUTH DAILY FOR BLOOD PRESSURE   dicyclomine 10 MG capsule Commonly known as: BENTYL Take 1 capsule (10 mg total) by mouth 4 (four) times daily -  before meals and at bedtime for 20 days.   docusate sodium 100 MG capsule Commonly known as: COLACE Take 100 mg by mouth daily.   DULoxetine 60 MG capsule Commonly known as: CYMBALTA TAKE 1 CAPSULE BY MOUTH ONCE DAILY   Enbrel Mini 50 MG/ML Soct Generic drug: Etanercept Inject 50 mg into the skin once a week.   hydrochlorothiazide 12.5 MG tablet Commonly known as: HYDRODIURIL TAKE 1 TABLET BY MOUTH DAILY.   Junel Fe 24 1-20 MG-MCG(24) tablet Generic drug: Norethindrone Acetate-Ethinyl Estrad-FE TAKE 1 TABLET BY MOUTH ONCE DAILY (USE NDC [phone])   loperamide 2 MG capsule Commonly known as: IMODIUM Take 1 capsule (2 mg total) by mouth as needed for diarrhea or loose stools (take 45 mins prior to eating and one time if you have diarrhea. Do not exceed 8 capsules per day).    losartan 50 MG tablet Commonly known as: COZAAR TAKE 1 TABLET BY MOUTH DAILY.   Melatonin 5 MG Caps Take by mouth at bedtime.   MULTIVITAMIN WOMEN PO Take by mouth daily.   ondansetron 8 MG disintegrating tablet Commonly known as: ZOFRAN-ODT Take 1 tablet (8 mg total) by mouth every 8 (eight) hours as needed for nausea or vomiting.   pantoprazole 40 MG tablet Commonly known as: Protonix Take 1 tablet (40 mg total) by mouth daily.   traZODone 50 MG tablet Commonly known as: DESYREL Take 1/2 tablet (25 mg total) by mouth at bedtime as needed.   triamcinolone 55 MCG/ACT Aero nasal inhaler Commonly known as: NASACORT use 2 sprays each nostril daily What changed:  when to take this reasons to take this   zolpidem 5 MG tablet Commonly known as: AMBIEN Take 1 tablet (5 mg total) by mouth at bedtime as needed for sleep.         ROS:  Constitutional: negative for chills, fatigue, and fevers Eyes: negative for visual disturbance and pain Ears, nose, mouth, throat, and face: positive for sinus problems, negative for ear drainage and sore throat Respiratory: negative for cough, wheezing, and shortness of breath Cardiovascular: negative for chest pain and palpitations Gastrointestinal: positive for abdominal pain, nausea, and reflux symptoms Genitourinary:negative for dysuria, frequency, and urinary retention Integument/breast: negative for dryness and rash Hematologic/lymphatic: negative for bleeding and lymphadenopathy Musculoskeletal:positive for back pain, neck pain, and joint pain Neurological: negative for dizziness, tremors, and numbness Endocrine: negative for temperature intolerance  Blood pressure 121/84, pulse 83, temperature 98.4 F (36.9 C), temperature source Oral, resp. rate 12, height '5\' 5"'$  (1.651 m), weight 191 lb (86.6 kg), last menstrual period 12/09/2021, SpO2 96 %. Physical Exam Vitals reviewed.  Constitutional:      Appearance: Normal appearance.   HENT:     Head: Normocephalic and atraumatic.  Eyes:     Extraocular Movements: Extraocular movements intact.     Pupils: Pupils are equal, round, and reactive to light.  Cardiovascular:     Rate and Rhythm: Normal rate and regular rhythm.  Pulmonary:     Effort: Pulmonary effort is normal.     Breath sounds: Normal breath sounds.  Abdominal:     Comments: Abdomen soft, nondistended, no percussion tenderness, nontender to palpation; no rigidity, guarding, or rebound tenderness, negative Murphy's  Musculoskeletal:        General: Normal range of motion.     Cervical back: Normal range of motion.  Skin:    General: Skin is warm and dry.  Neurological:     General: No focal deficit present.  Mental Status: She is alert.  Psychiatric:        Behavior: Behavior normal.     Results: Abdominal US (01/09/22): IMPRESSION: Cholelithiasis and gallbladder sludge. Gallbladder wall thickening. No definite Murphy sign. Cholecystitis is possible. No evidence of biliary obstruction.   Mild fatty change of the liver.  Assessment & Plan:  Suprina Mandeville is a 46 y.o. female who presents for cholelithiasis.  -I explained the pathophysiology of cholelithiasis and our reasons for recommending laparoscopic cholecystectomy -I counseled the patient about the indication, risks and benefits of laparoscopic cholecystectomy.  She understands there is a very small chance for bleeding, infection, injury to normal structures (including common bile duct), conversion to open surgery, persistent symptoms, evolution of postcholecystectomy diarrhea, need for secondary interventions, anesthesia reaction, cardiopulmonary issues and other risks not specifically detailed here. I described the expected recovery, the plan for follow-up and the restrictions during the recovery phase.  All questions were answered. -Information provided regarding surgery and low-fat diet -Patient tentatively scheduled for  surgery on 7/13  All questions were answered to the satisfaction of the patient.  [name], DO [facility] [address] [place], [place] [address] [phone] (office)

## 2022-01-29 ENCOUNTER — Other Ambulatory Visit (HOSPITAL_COMMUNITY): Payer: Self-pay

## 2022-01-31 ENCOUNTER — Ambulatory Visit (INDEPENDENT_AMBULATORY_CARE_PROVIDER_SITE_OTHER): Payer: 59

## 2022-01-31 ENCOUNTER — Ambulatory Visit: Payer: 59 | Admitting: Orthopaedic Surgery

## 2022-01-31 ENCOUNTER — Telehealth: Payer: Self-pay | Admitting: *Deleted

## 2022-01-31 ENCOUNTER — Encounter: Payer: Self-pay | Admitting: Orthopaedic Surgery

## 2022-01-31 DIAGNOSIS — S92502D Displaced unspecified fracture of left lesser toe(s), subsequent encounter for fracture with routine healing: Secondary | ICD-10-CM

## 2022-01-31 DIAGNOSIS — M79675 Pain in left toe(s): Secondary | ICD-10-CM

## 2022-01-31 DIAGNOSIS — S92502A Displaced unspecified fracture of left lesser toe(s), initial encounter for closed fracture: Secondary | ICD-10-CM | POA: Insufficient documentation

## 2022-01-31 NOTE — Telephone Encounter (Signed)
Received call from patient regarding FMLA forms from [employer].  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

## 2022-01-31 NOTE — Progress Notes (Signed)
Office Visit Note   Patient: Karen Burton           Date of Birth: Feb 25, 1976           MRN: [id number] Visit Date: 01/31/2022              Requested by: Coral Spikes, DO [place],  [place] [address] PCP: Coral Spikes, DO   Assessment & Plan: Visit Diagnoses: No diagnosis found.  Plan: Patient is almost 3 months status post hitting her left fourth toe on a concrete block.  She has been immobilized in a Darco shoe.  She does report it still throbs but she is feeling better.  She may transition to supportive shoe wear.  May follow-up as needed.  Follow-Up Instructions: No follow-ups on file.   Orders:  No orders of the defined types were placed in this encounter.  No orders of the defined types were placed in this encounter.     Procedures: No procedures performed   Clinical Data: No additional findings.   Subjective: Chief Complaint  Patient presents with   Left Foot - Follow-up   Patient presents today for follow up if her possible nondisplaced fracture over the distal end of the fourth middle phalanx. She states that she has been taking vitamin D 5000 daily as instructed. She states that she has been wearing post op shoe which has been helping with toe pain. Her toe she feels like has continued to having throbbing pains. At this time she is currently not taking any medication for pain.   Review of Systems  All other systems reviewed and are negative.    Objective: Vital Signs: LMP 12/09/2021   Physical Exam Constitutional:      Appearance: Normal appearance.  Pulmonary:     Effort: Pulmonary effort is normal.  Neurological:     General: No focal deficit present.     Mental Status: She is alert.     Ortho Exam Examination of her foot she has a good palpable pulse no redness no cellulitis.  She has no swelling.  She still has some throbbing at the distal end of the fourth toe over the DIP joint.  No tenderness over the  metatarsals no tenderness with manipulation of the midfoot Specialty Comments:  No specialty comments available.  Imaging: No results found.   PMFS History: Patient Active Problem List   Diagnosis Date Noted   RUQ pain 01/09/2022   Insomnia 09/06/2021   Fibromyalgia 11/06/2016   Uterine leiomyoma 12/20/2015   Essential hypertension 01/12/2013   Esophageal reflux 01/12/2013   Rheumatoid arthritis ([place]) 01/12/2013   Past Medical History:  Diagnosis Date   Anemia    Arthritis    Dysmenorrhea 12/20/2015   Eczema    mild   Fibroids 12/20/2015   Fibromyalgia    Menorrhagia with regular cycle 12/20/2015   Rash    Reflux    Rhinitis    chonic   Vitamin D deficiency    Vulvar itching 12/20/2015    Family History  Problem Relation Age of Onset   Diabetes Mother    Hypertension Mother    Asthma Mother    Cancer Mother        breast   Thyroid disease Mother    Arthritis Mother    Berenice Primas' disease Mother    Cancer Maternal Grandmother        pancreatic   Fibroids Sister    COPD Maternal Aunt  Asthma Maternal Aunt    Gout Maternal Aunt     Past Surgical History:  Procedure Laterality Date   ESOPHAGOGASTRODUODENOSCOPY ENDOSCOPY     IR GENERIC HISTORICAL  02/07/2016   IR RADIOLOGIST EVAL & MGMT 02/07/2016 [name], MD [facility] INTERV RAD   WISDOM TOOTH EXTRACTION     Social History   Occupational History   Not on file  Tobacco Use   Smoking status: Never    Passive exposure: Never   Smokeless tobacco: Never  Vaping Use   Vaping Use: Never used  Substance and Sexual Activity   Alcohol use: No   Drug use: Never   Sexual activity: Not Currently    Birth control/protection: Pill

## 2022-02-05 ENCOUNTER — Encounter (HOSPITAL_COMMUNITY)
Admission: RE | Admit: 2022-02-05 | Discharge: 2022-02-05 | Disposition: A | Discharge status: Home / Self Care | Payer: 59 | Source: Ambulatory Visit | Attending: Surgery | Admitting: Surgery

## 2022-02-05 ENCOUNTER — Encounter (HOSPITAL_COMMUNITY): Payer: Self-pay

## 2022-02-05 ENCOUNTER — Other Ambulatory Visit (HOSPITAL_COMMUNITY): Payer: Self-pay

## 2022-02-05 DIAGNOSIS — Z01818 Encounter for other preprocedural examination: Secondary | ICD-10-CM

## 2022-02-05 HISTORY — DX: Gastro-esophageal reflux disease without esophagitis: K21.9

## 2022-02-05 HISTORY — DX: Other specified postprocedural states: R11.2

## 2022-02-05 HISTORY — DX: Nausea with vomiting, unspecified: Z98.890

## 2022-02-05 HISTORY — DX: Rheumatoid arthritis, unspecified: M06.9

## 2022-02-05 HISTORY — DX: Essential (primary) hypertension: I10

## 2022-02-06 ENCOUNTER — Ambulatory Visit: Payer: 59 | Admitting: Rheumatology

## 2022-02-06 ENCOUNTER — Ambulatory Visit: Payer: 59 | Admitting: Physician Assistant

## 2022-02-06 ENCOUNTER — Encounter: Payer: Self-pay | Admitting: Physician Assistant

## 2022-02-06 ENCOUNTER — Other Ambulatory Visit (HOSPITAL_COMMUNITY): Payer: Self-pay

## 2022-02-06 VITALS — BP 139/88 | HR 80 | Ht 65.0 in | Wt 190.4 lb

## 2022-02-06 DIAGNOSIS — R5383 Other fatigue: Secondary | ICD-10-CM | POA: Diagnosis not present

## 2022-02-06 DIAGNOSIS — R7989 Other specified abnormal findings of blood chemistry: Secondary | ICD-10-CM | POA: Diagnosis not present

## 2022-02-06 DIAGNOSIS — M0579 Rheumatoid arthritis with rheumatoid factor of multiple sites without organ or systems involvement: Secondary | ICD-10-CM | POA: Diagnosis not present

## 2022-02-06 DIAGNOSIS — Z79899 Other long term (current) drug therapy: Secondary | ICD-10-CM | POA: Diagnosis not present

## 2022-02-06 DIAGNOSIS — Z8679 Personal history of other diseases of the circulatory system: Secondary | ICD-10-CM

## 2022-02-06 DIAGNOSIS — M797 Fibromyalgia: Secondary | ICD-10-CM | POA: Diagnosis not present

## 2022-02-06 DIAGNOSIS — R748 Abnormal levels of other serum enzymes: Secondary | ICD-10-CM | POA: Diagnosis not present

## 2022-02-06 DIAGNOSIS — Z87898 Personal history of other specified conditions: Secondary | ICD-10-CM

## 2022-02-06 DIAGNOSIS — Z8719 Personal history of other diseases of the digestive system: Secondary | ICD-10-CM

## 2022-02-06 MED ORDER — METHOCARBAMOL 500 MG PO TABS
500.0000 mg | ORAL_TABLET | Freq: Every day | ORAL | 0 refills | Status: DC | PRN
  Filled 2022-02-06: qty 20, 20d supply, fill #0

## 2022-02-06 NOTE — Patient Instructions (Signed)
Standing Labs We placed an order today for your standing lab work.   Please have your standing labs drawn in September and every 3 months   If possible, please have your labs drawn 2 weeks prior to your appointment so that the provider can discuss your results at your appointment.  Please note that you may see your imaging and lab results in MyChart before we have reviewed them. We may be awaiting multiple results to interpret others before contacting you. Please allow our office up to 72 hours to thoroughly review all of the results before contacting the office for clarification of your results.  We have open lab daily: Monday through Thursday from 1:30-4:30 PM and Friday from 1:30-4:00 PM at the office of Dr. Shaili Deveshwar, [facility].   Please be advised, all patients with office appointments requiring lab work will take precedent over walk-in lab work.  If possible, please come for your lab work on Monday and Friday afternoons, as you may experience shorter wait times. The office is located at [address], [address], [place], [place] [address] No appointment is necessary.   Labs are drawn by [facility]. Please bring your co-pay at the time of your lab draw.  You may receive a bill from [facility] for your lab work.  Please note if you are on Hydroxychloroquine and and an order has been placed for a Hydroxychloroquine level, you will need to have it drawn 4 hours or more after your last dose.  If you wish to have your labs drawn at another location, please call the office 24 hours in advance to send orders.  If you have any questions regarding directions or hours of operation,  please call [phone].   As a reminder, please drink plenty of water prior to coming for your lab work. Thanks!  

## 2022-02-08 ENCOUNTER — Ambulatory Visit (HOSPITAL_BASED_OUTPATIENT_CLINIC_OR_DEPARTMENT_OTHER): Payer: 59 | Admitting: Certified Registered"

## 2022-02-08 ENCOUNTER — Other Ambulatory Visit: Payer: Self-pay

## 2022-02-08 ENCOUNTER — Ambulatory Visit (HOSPITAL_COMMUNITY)
Admission: RE | Admit: 2022-02-08 | Discharge: 2022-02-08 | Disposition: A | Discharge status: Home / Self Care | Payer: 59 | Attending: Surgery | Admitting: Surgery

## 2022-02-08 ENCOUNTER — Ambulatory Visit (HOSPITAL_COMMUNITY): Payer: 59 | Admitting: Certified Registered"

## 2022-02-08 ENCOUNTER — Encounter (HOSPITAL_COMMUNITY): Payer: Self-pay | Admitting: Surgery

## 2022-02-08 ENCOUNTER — Encounter (HOSPITAL_COMMUNITY)
Admission: RE | Disposition: A | Discharge status: Home / Self Care | Payer: Self-pay | Source: Home / Self Care | Attending: Surgery

## 2022-02-08 DIAGNOSIS — E669 Obesity, unspecified: Secondary | ICD-10-CM | POA: Insufficient documentation

## 2022-02-08 DIAGNOSIS — Z79899 Other long term (current) drug therapy: Secondary | ICD-10-CM | POA: Diagnosis not present

## 2022-02-08 DIAGNOSIS — I10 Essential (primary) hypertension: Secondary | ICD-10-CM | POA: Insufficient documentation

## 2022-02-08 DIAGNOSIS — Z6831 Body mass index (BMI) 31.0-31.9, adult: Secondary | ICD-10-CM | POA: Diagnosis not present

## 2022-02-08 DIAGNOSIS — Z01818 Encounter for other preprocedural examination: Secondary | ICD-10-CM

## 2022-02-08 DIAGNOSIS — K8010 Calculus of gallbladder with chronic cholecystitis without obstruction: Secondary | ICD-10-CM | POA: Diagnosis not present

## 2022-02-08 DIAGNOSIS — M797 Fibromyalgia: Secondary | ICD-10-CM | POA: Diagnosis not present

## 2022-02-08 DIAGNOSIS — K8020 Calculus of gallbladder without cholecystitis without obstruction: Secondary | ICD-10-CM | POA: Diagnosis not present

## 2022-02-08 DIAGNOSIS — M069 Rheumatoid arthritis, unspecified: Secondary | ICD-10-CM | POA: Diagnosis not present

## 2022-02-08 DIAGNOSIS — K219 Gastro-esophageal reflux disease without esophagitis: Secondary | ICD-10-CM | POA: Diagnosis not present

## 2022-02-08 HISTORY — PX: CHOLECYSTECTOMY: SHX55

## 2022-02-08 LAB — POCT PREGNANCY, URINE: Preg Test, Ur: NEGATIVE

## 2022-02-08 SURGERY — LAPAROSCOPIC CHOLECYSTECTOMY
Anesthesia: General | Site: Abdomen

## 2022-02-08 MED ORDER — PROPOFOL 10 MG/ML IV BOLUS
INTRAVENOUS | Status: AC
  Filled 2022-02-08: qty 20

## 2022-02-08 MED ORDER — BUPIVACAINE HCL (PF) 0.5 % IJ SOLN
INTRAMUSCULAR | Status: AC
  Filled 2022-02-08: qty 30

## 2022-02-08 MED ORDER — ORAL CARE MOUTH RINSE: 15.0000 mL | Freq: Once | OROMUCOSAL | Status: AC

## 2022-02-08 MED ORDER — ROCURONIUM BROMIDE 10 MG/ML (PF) SYRINGE
PREFILLED_SYRINGE | INTRAVENOUS | Status: DC | PRN
  Administered 2022-02-08: 50 mg via INTRAVENOUS

## 2022-02-08 MED ORDER — EPHEDRINE SULFATE-NACL 50-0.9 MG/10ML-% IV SOSY
PREFILLED_SYRINGE | INTRAVENOUS | Status: DC | PRN
  Administered 2022-02-08: 5 mg via INTRAVENOUS

## 2022-02-08 MED ORDER — ACETAMINOPHEN 500 MG PO TABS: 1000.0000 mg | ORAL_TABLET | Freq: Four times a day (QID) | ORAL | 0 refills | Status: AC

## 2022-02-08 MED ORDER — MIDAZOLAM HCL 2 MG/2ML IJ SOLN
INTRAMUSCULAR | Status: AC
  Filled 2022-02-08: qty 2

## 2022-02-08 MED ORDER — LIDOCAINE HCL (PF) 2 % IJ SOLN
INTRAMUSCULAR | Status: AC
  Filled 2022-02-08: qty 5

## 2022-02-08 MED ORDER — CHLORHEXIDINE GLUCONATE CLOTH 2 % EX PADS: 6.0000 | MEDICATED_PAD | Freq: Once | CUTANEOUS | Status: DC

## 2022-02-08 MED ORDER — KETOROLAC TROMETHAMINE 30 MG/ML IJ SOLN
INTRAMUSCULAR | Status: AC
  Filled 2022-02-08: qty 1

## 2022-02-08 MED ORDER — SODIUM CHLORIDE 0.9 % IR SOLN
Status: DC | PRN
  Administered 2022-02-08: 3000 mL
  Administered 2022-02-08: 1000 mL

## 2022-02-08 MED ORDER — DEXMEDETOMIDINE HCL IN NACL 80 MCG/20ML IV SOLN
INTRAVENOUS | Status: AC
  Filled 2022-02-08: qty 20

## 2022-02-08 MED ORDER — FENTANYL CITRATE PF 50 MCG/ML IJ SOSY
25.0000 ug | PREFILLED_SYRINGE | INTRAMUSCULAR | Status: DC | PRN
  Administered 2022-02-08: 50 ug via INTRAVENOUS
  Filled 2022-02-08: qty 1

## 2022-02-08 MED ORDER — MIDAZOLAM HCL 2 MG/2ML IJ SOLN
INTRAMUSCULAR | Status: DC | PRN
  Administered 2022-02-08: 2 mg via INTRAVENOUS

## 2022-02-08 MED ORDER — ONDANSETRON HCL 4 MG/2ML IJ SOLN
INTRAMUSCULAR | Status: AC
  Filled 2022-02-08: qty 2

## 2022-02-08 MED ORDER — OXYCODONE HCL 5 MG PO TABS: 5.0000 mg | ORAL_TABLET | Freq: Four times a day (QID) | ORAL | 0 refills | Status: AC | PRN

## 2022-02-08 MED ORDER — SODIUM CHLORIDE 0.9 % IV SOLN
INTRAVENOUS | Status: AC
  Filled 2022-02-08: qty 2

## 2022-02-08 MED ORDER — EPHEDRINE 5 MG/ML INJ
INTRAVENOUS | Status: AC
  Filled 2022-02-08: qty 5

## 2022-02-08 MED ORDER — LACTATED RINGERS IV SOLN
INTRAVENOUS | Status: DC
  Administered 2022-02-08: 1000 mL via INTRAVENOUS

## 2022-02-08 MED ORDER — ROCURONIUM BROMIDE 10 MG/ML (PF) SYRINGE
PREFILLED_SYRINGE | INTRAVENOUS | Status: AC
  Filled 2022-02-08: qty 10

## 2022-02-08 MED ORDER — FENTANYL CITRATE (PF) 250 MCG/5ML IJ SOLN
INTRAMUSCULAR | Status: DC | PRN
  Administered 2022-02-08 (×4): 50 ug via INTRAVENOUS

## 2022-02-08 MED ORDER — LIDOCAINE 2% (20 MG/ML) 5 ML SYRINGE
INTRAMUSCULAR | Status: DC | PRN
  Administered 2022-02-08: 100 mg via INTRAVENOUS

## 2022-02-08 MED ORDER — SUGAMMADEX SODIUM 200 MG/2ML IV SOLN
INTRAVENOUS | Status: DC | PRN
  Administered 2022-02-08: 150 mg via INTRAVENOUS

## 2022-02-08 MED ORDER — SEVOFLURANE IN SOLN
RESPIRATORY_TRACT | Status: AC
  Filled 2022-02-08: qty 250

## 2022-02-08 MED ORDER — ONDANSETRON HCL 4 MG/2ML IJ SOLN: 4.0000 mg | Freq: Once | INTRAMUSCULAR | Status: DC | PRN

## 2022-02-08 MED ORDER — HYDROCODONE-ACETAMINOPHEN 7.5-325 MG PO TABS
1.0000 | ORAL_TABLET | Freq: Once | ORAL | Status: AC | PRN
  Administered 2022-02-08: 1 via ORAL
  Filled 2022-02-08: qty 1

## 2022-02-08 MED ORDER — BUPIVACAINE HCL (PF) 0.5 % IJ SOLN
INTRAMUSCULAR | Status: DC | PRN
  Administered 2022-02-08: 25 mL

## 2022-02-08 MED ORDER — KETOROLAC TROMETHAMINE 30 MG/ML IJ SOLN
INTRAMUSCULAR | Status: DC | PRN
  Administered 2022-02-08: 30 mg via INTRAVENOUS

## 2022-02-08 MED ORDER — DEXAMETHASONE SODIUM PHOSPHATE 10 MG/ML IJ SOLN
INTRAMUSCULAR | Status: AC
  Filled 2022-02-08: qty 1

## 2022-02-08 MED ORDER — SODIUM CHLORIDE 0.9 % IV SOLN
2.0000 g | INTRAVENOUS | Status: AC
  Administered 2022-02-08: 2 g via INTRAVENOUS

## 2022-02-08 MED ORDER — DEXAMETHASONE SODIUM PHOSPHATE 4 MG/ML IJ SOLN
INTRAMUSCULAR | Status: DC | PRN
  Administered 2022-02-08: 5 mg via INTRAVENOUS

## 2022-02-08 MED ORDER — FENTANYL CITRATE (PF) 250 MCG/5ML IJ SOLN
INTRAMUSCULAR | Status: AC
  Filled 2022-02-08: qty 5

## 2022-02-08 MED ORDER — HEMOSTATIC AGENTS (NO CHARGE) OPTIME
TOPICAL | Status: DC | PRN
  Administered 2022-02-08: 1 via TOPICAL

## 2022-02-08 MED ORDER — PROPOFOL 10 MG/ML IV BOLUS
INTRAVENOUS | Status: DC | PRN
  Administered 2022-02-08: 50 mg via INTRAVENOUS
  Administered 2022-02-08: 200 mg via INTRAVENOUS

## 2022-02-08 MED ORDER — ONDANSETRON HCL 4 MG/2ML IJ SOLN
INTRAMUSCULAR | Status: DC | PRN
  Administered 2022-02-08: 4 mg via INTRAVENOUS

## 2022-02-08 MED ORDER — CHLORHEXIDINE GLUCONATE 0.12 % MT SOLN
15.0000 mL | Freq: Once | OROMUCOSAL | Status: AC
  Administered 2022-02-08: 15 mL via OROMUCOSAL

## 2022-02-08 SURGICAL SUPPLY — 47 items
ADH SKN CLS APL DERMABOND .7 (GAUZE/BANDAGES/DRESSINGS) ×1
APL PRP STRL LF DISP 70% ISPRP (MISCELLANEOUS) ×1
APPLIER CLIP ROT 10 11.4 M/L (STAPLE) ×2
APR CLP MED LRG 11.4X10 (STAPLE) ×1
BLADE SURG 15 STRL LF DISP TIS (BLADE) ×1 IMPLANT
BLADE SURG 15 STRL SS (BLADE) ×2
CHLORAPREP W/TINT 26 (MISCELLANEOUS) ×2 IMPLANT
CLIP APPLIE ROT 10 11.4 M/L (STAPLE) ×1 IMPLANT
CLOTH BEACON ORANGE TIMEOUT ST (SAFETY) ×2 IMPLANT
COVER LIGHT HANDLE STERIS (MISCELLANEOUS) ×4 IMPLANT
DECANTER SPIKE VIAL GLASS SM (MISCELLANEOUS) ×2 IMPLANT
DERMABOND ADVANCED (GAUZE/BANDAGES/DRESSINGS) ×1
DERMABOND ADVANCED .7 DNX12 (GAUZE/BANDAGES/DRESSINGS) ×1 IMPLANT
ELECT REM PT RETURN 9FT ADLT (ELECTROSURGICAL) ×2
ELECTRODE REM PT RTRN 9FT ADLT (ELECTROSURGICAL) ×1 IMPLANT
GLOVE BIO SURGEON STRL SZ 6.5 (GLOVE) ×1 IMPLANT
GLOVE BIO SURGEON STRL SZ7 (GLOVE) ×1 IMPLANT
GLOVE BIOGEL PI IND STRL 6.5 (GLOVE) ×1 IMPLANT
GLOVE BIOGEL PI IND STRL 7.0 (GLOVE) ×2 IMPLANT
GLOVE BIOGEL PI INDICATOR 6.5 (GLOVE) ×3
GLOVE BIOGEL PI INDICATOR 7.0 (GLOVE) ×2
GLOVE INDICATOR 7.0 STRL GRN (GLOVE) ×1 IMPLANT
GLOVE SURG SS PI 6.5 STRL IVOR (GLOVE) ×5 IMPLANT
GOWN STRL REUS W/TWL LRG LVL3 (GOWN DISPOSABLE) ×7 IMPLANT
HEMOSTAT SNOW SURGICEL 2X4 (HEMOSTASIS) ×2 IMPLANT
INST SET LAPROSCOPIC AP (KITS) ×2 IMPLANT
IV NS IRRIG 3000ML ARTHROMATIC (IV SOLUTION) ×1 IMPLANT
KIT TURNOVER KIT A (KITS) ×2 IMPLANT
MANIFOLD NEPTUNE II (INSTRUMENTS) ×2 IMPLANT
NDL INSUFFLATION 14GA 120MM (NEEDLE) ×1 IMPLANT
NEEDLE INSUFFLATION 14GA 120MM (NEEDLE) ×2 IMPLANT
NS IRRIG 1000ML POUR BTL (IV SOLUTION) ×2 IMPLANT
PACK LAP CHOLE LZT030E (CUSTOM PROCEDURE TRAY) ×2 IMPLANT
PAD ARMBOARD 7.5X6 YLW CONV (MISCELLANEOUS) ×2 IMPLANT
SET BASIN LINEN APH (SET/KITS/TRAYS/PACK) ×2 IMPLANT
SET TUBE IRRIG SUCTION NO TIP (IRRIGATION / IRRIGATOR) ×1 IMPLANT
SET TUBE SMOKE EVAC HIGH FLOW (TUBING) ×2 IMPLANT
SLEEVE Z-THREAD 5X100MM (TROCAR) ×2 IMPLANT
SUT MNCRL AB 4-0 PS2 18 (SUTURE) ×4 IMPLANT
SUT VICRYL 0 UR6 27IN ABS (SUTURE) ×2 IMPLANT
SYS BAG RETRIEVAL 10MM (BASKET) ×2
SYSTEM BAG RETRIEVAL 10MM (BASKET) ×1 IMPLANT
TROCAR Z-THRD FIOS HNDL 11X100 (TROCAR) ×2 IMPLANT
TROCAR Z-THREAD FIOS 5X100MM (TROCAR) ×2 IMPLANT
TROCAR Z-THREAD SLEEVE 11X100 (TROCAR) ×2 IMPLANT
TUBE CONNECTING 12X1/4 (SUCTIONS) ×2 IMPLANT
WARMER LAPAROSCOPE (MISCELLANEOUS) ×2 IMPLANT

## 2022-02-08 NOTE — Transfer of Care (Signed)
Immediate Anesthesia Transfer of Care Note  Patient: Karen Burton  Procedure(s) Performed: LAPAROSCOPIC CHOLECYSTECTOMY (Abdomen)  Patient Location: PACU  Anesthesia Type:General  Level of Consciousness: awake, alert  and oriented  Airway & Oxygen Therapy: Patient Spontanous Breathing and Patient connected to nasal cannula oxygen  Post-op Assessment: Report given to RN and Post -op Vital signs reviewed and stable  Post vital signs: Reviewed and stable  Last Vitals:  Vitals Value Taken Time  BP 132/77 02/08/22 0909  Temp    Pulse 88 02/08/22 0909  Resp 19 02/08/22 0909  SpO2 100 % 02/08/22 0909  Vitals shown include unvalidated device data.  Last Pain:  Vitals:   02/08/22 0647  TempSrc: Oral  PainSc: 2       Patients Stated Pain Goal: 8 (29/03/79 5583)  Complications: No notable events documented.

## 2022-02-08 NOTE — Telephone Encounter (Signed)
FMLA completed and faxed to [employer] with confirmation  - [phone].  Patient out of work 02/08/2022 with return to work date of 02/26/2022 unrestricted.

## 2022-02-08 NOTE — Interval H&P Note (Signed)
History and Physical Interval Note:  02/08/2022 7:17 AM  Karen Burton  has presented today for surgery, with the diagnosis of Cholelithiasis.  The various methods of treatment have been discussed with the patient and family. After consideration of risks, benefits and other options for treatment, the patient has consented to  Procedure(s): LAPAROSCOPIC CHOLECYSTECTOMY (N/A) as a surgical intervention.  The patient's history has been reviewed, patient examined, no change in status, stable for surgery.  I have reviewed the patient's chart and labs.  Questions were answered to the patient's satisfaction.     [name]

## 2022-02-08 NOTE — Anesthesia Preprocedure Evaluation (Signed)
Anesthesia Evaluation  Patient identified by MRN, date of birth, ID band Patient awake    Reviewed: Allergy & Precautions, NPO status , Patient's Chart, lab work & pertinent test results  History of Anesthesia Complications (+) PONV and history of anesthetic complications  Airway Mallampati: II  TM Distance: >3 FB Neck ROM: Full    Dental no notable dental hx.    Pulmonary neg pulmonary ROS,    Pulmonary exam normal        Cardiovascular hypertension, negative cardio ROS Normal cardiovascular exam     Neuro/Psych negative neurological ROS     GI/Hepatic Neg liver ROS, GERD  ,  Endo/Other  negative endocrine ROS  Renal/GU negative Renal ROS     Musculoskeletal  (+) Arthritis , Fibromyalgia -  Abdominal (+) + obese,   Peds  Hematology  (+) Blood dyscrasia, anemia ,   Anesthesia Other Findings   Reproductive/Obstetrics                             Anesthesia Physical Anesthesia Plan  ASA: 2  Anesthesia Plan: General   Post-op Pain Management: Dilaudid IV   Induction: Intravenous  PONV Risk Score and Plan: 4 or greater  Airway Management Planned: Oral ETT  Additional Equipment:   Intra-op Plan:   Post-operative Plan: Extubation in OR  Informed Consent: I have reviewed the patients History and Physical, chart, labs and discussed the procedure including the risks, benefits and alternatives for the proposed anesthesia with the patient or authorized representative who has indicated his/her understanding and acceptance.     Dental advisory given  Plan Discussed with: CRNA  Anesthesia Plan Comments:         Anesthesia Quick Evaluation

## 2022-02-08 NOTE — Anesthesia Procedure Notes (Signed)
Procedure Name: Intubation Date/Time: 02/08/2022 7:42 AM  Performed by: Orlie Dakin, CRNAPre-anesthesia Checklist: Patient identified, Emergency Drugs available, Suction available and Patient being monitored Patient Re-evaluated:Patient Re-evaluated prior to induction Oxygen Delivery Method: Circle system utilized Preoxygenation: Pre-oxygenation with 100% oxygen Induction Type: IV induction Ventilation: Mask ventilation without difficulty Laryngoscope Size: Mac and 4 Grade View: Grade II Tube type: Oral Tube size: 7.5 mm Number of attempts: 1 Airway Equipment and Method: Stylet Placement Confirmation: ETT inserted through vocal cords under direct vision, positive ETCO2 and breath sounds checked- equal and bilateral Secured at: 22 cm Tube secured with: Tape Dental Injury: Teeth and Oropharynx as per pre-operative assessment  Comments: 4x4s bite block used.

## 2022-02-08 NOTE — Op Note (Signed)
Operative Note   Preoperative Diagnosis: Symptomatic cholelithiasis   Postoperative Diagnosis: Same   Procedure(s) Performed: Laparoscopic cholecystectomy   Surgeon: [name], DO    Assistants: Marquita Palms, RN   Anesthesia: General endotracheal   Anesthesiologist: Dr. Delma Freeze   Specimens: Gallbladder    Estimated Blood Loss: Minimal    Blood Replacement: None    Complications: None    Indications: Patient is a 46 year old female who presents for laparoscopic cholecystectomy.  She has been having episodes of right upper quadrant abdominal pain with eating, and was noted to have cholelithiasis on abdominal ultrasound.  She is agreeable to laparoscopic cholecystectomy at this time.  All risks, benefits, and alternatives to laparoscopic cholecystectomy were discussed with the patient, all of her questions were answered to her expressed satisfaction. The patient expresses she wishes to proceed, and informed consent was obtained.  Operative Findings: Minimally inflamed gallbladder with innumerable small gallstones   Procedure: The patient was taken to the operating room and placed supine. General endotracheal anesthesia was induced. Intravenous antibiotics were administered per protocol. An orogastric tube positioned to decompress the stomach. The abdomen was prepared and draped in the usual sterile fashion.    An infraumbilical incision was made and a Veress technique was utilized to achieve pneumoperitoneum to 15 mmHg with carbon dioxide. A 11 mm optiview port was placed through the supraumbilical region, and a 10 mm 0-degree operative laparoscope was introduced. The area underlying the trocar and Veress needle were inspected and without evidence of injury.  Remaining trocars were placed under direct vision. Two 5 mm ports were placed in the right abdomen, between the anterior axillary and midclavicular line.  A final 11 mm port was placed through the mid-epigastrium, near the  falciform ligament.    The gallbladder fundus was elevated cephalad and the infundibulum was retracted to the patient's right. The gallbladder/cystic duct junction was skeletonized. The cystic artery noted in the triangle of Calot and was also skeletonized.  We then continued liberal medial and lateral dissection until the critical view of safety was achieved.    The cystic duct and cystic artery were doubly clipped and divided. The gallbladder was then dissected from the liver bed with electrocautery. The specimen was placed in an Endopouch and was retrieved through the epigastric site.   Final inspection revealed acceptable hemostasis. Surgical SNOW was placed in the gallbladder bed.  Trocars were removed under direct visualization, and pneumoperitoneum was released.  0 Vicryl fascial sutures were used to close the epigastric and umbilical port sites. Skin incisions were closed with 4-0 Monocryl subcuticular sutures and Dermabond. The patient was awakened from anesthesia and extubated without complication.    [name], DO  [facility] [address] [place], [place] [address] [phone] (office)

## 2022-02-08 NOTE — Discharge Instructions (Signed)
Ambulatory Surgery Discharge Instructions  General Anesthesia or Sedation Do not drive or operate heavy machinery for 24 hours.  Do not consume alcohol, tranquilizers, sleeping medications, or any non-prescribed medications for 24 hours. Do not make important decisions or sign any important papers in the next 24 hours. You should have someone with you tonight at home.  Activity  You are advised to go directly home from the hospital.  Restrict your activities and rest for a day.  Resume light activity tomorrow. No heavy lifting over 10 lbs or strenuous exercise.  Fluids and Diet Begin with clear liquids, bouillon, dry toast, soda crackers.  If not nauseated, you may go to a regular diet when you desire.  Greasy and spicy foods are not advised.  Medications  If you have not had a bowel movement in 24 hours, take 2 tablespoons over the counter Milk of mag.             You May resume your blood thinners tomorrow (Aspirin, coumadin, or other).  You are being discharged with prescriptions for Opioid/Narcotic Medications: There are some specific considerations for these medications that you should know. Opioid Meds have risks & benefits. Addiction to these meds is always a concern with prolonged use Take medication only as directed Do not drive while taking narcotic pain medication Do not crush tablets or capsules Do not use a different container than medication was dispensed in Lock the container of medication in a cool, dry place out of reach of children and pets. Opioid medication can cause addiction Do not share with anyone else (this is a felony) Do not store medications for future use. Dispose of them properly.     Disposal:  Find a [facility] drug take back site near you.  If you can't get to a drug take back site, use the recipe below as a last resort to dispose of expired, unused or unwanted drugs. Disposal  (Do not dispose chemotherapy drugs this way, talk to your  prescribing doctor instead.) Step 1: Mix drugs (do not crush) with dirt, kitty litter, or used coffee grounds and add a small amount of water to dissolve any solid medications. Step 2: Seal drugs in plastic bag. Step 3: Place plastic bag in trash. Step 4: Take prescription container and scratch out personal information, then recycle or throw away.  Operative Site  You have a liquid bandage over your incisions, this will begin to flake off in about a week. Ok to shower tomorrow. Keep wound clean and dry. No baths or swimming. No lifting more than 10 pounds.  Contact Information: If you have questions or concerns, please call our office, [phone], Monday- Thursday 8AM-5PM and Friday 8AM-12Noon.  If it is after hours or on the weekend, please call [facility], [phone], and ask to speak to the surgeon on call for Dr. Jr Milliron at [place].   SPECIFIC COMPLICATIONS TO WATCH FOR: Inability to urinate Fever over 101? F by mouth Nausea and vomiting lasting longer than 24 hours. Pain not relieved by medication ordered Swelling around the operative site Increased redness, warmth, hardness, around operative area Numbness, tingling, or cold fingers or toes Blood -soaked dressing, (small amounts of oozing may be normal) Increasing and progressive drainage from surgical area or exam site  

## 2022-02-08 NOTE — Anesthesia Postprocedure Evaluation (Signed)
Anesthesia Post Note  Patient: Karen Burton  Procedure(s) Performed: LAPAROSCOPIC CHOLECYSTECTOMY (Abdomen)  Patient location during evaluation: PACU Anesthesia Type: General Level of consciousness: awake and alert Pain management: pain level controlled Vital Signs Assessment: post-procedure vital signs reviewed and stable Respiratory status: spontaneous breathing, nonlabored ventilation, respiratory function stable and patient connected to nasal cannula oxygen Cardiovascular status: blood pressure returned to baseline and stable Postop Assessment: no apparent nausea or vomiting Anesthetic complications: no   There were no known notable events for this encounter.   Last Vitals:  Vitals:   02/08/22 1000 02/08/22 1019  BP: 116/80 115/78  Pulse: 90 82  Resp: 19 15  Temp:  (!) 36.4 C  SpO2: 95% 96%    Last Pain:  Vitals:   02/08/22 1019  TempSrc: Oral  PainSc: 4                  Trixie Rude

## 2022-02-08 NOTE — Progress Notes (Signed)
Update Note:  Spoke with the patient's sister, Karen Burton, in the consultation room.  Explained that she tolerated the procedure well and we were able to really move her gallbladder without issue.  She has dissolvable stitches under the skin and overlying Dermabond.  The Dermabond will flake off in 10 to 14 days.  I have discharged her with prescription for Roxicodone, that she should take as needed for pain.  She should take a stool softener while taking any narcotic pain medications.  I also recommend she take scheduled Tylenol for the next 7 days.  All questions were answered to her expressed satisfaction.  [name], DO [facility] [address] [place], [place] [address] [phone] (office)

## 2022-02-09 ENCOUNTER — Encounter (HOSPITAL_COMMUNITY): Payer: Self-pay | Admitting: Surgery

## 2022-02-09 LAB — SURGICAL PATHOLOGY

## 2022-02-12 ENCOUNTER — Telehealth: Payer: Self-pay | Admitting: *Deleted

## 2022-02-12 NOTE — Telephone Encounter (Signed)
Received call from patient [phone]~ telephone.  Surgical Date: 02/08/2022 Procedure: Lap Chole  Patient reports LLQ pain S/P surgery. Reports that she is passing gas, but has not had BM since surgery.   Advised to use stool softeners and increase water intake. Advised to move around and walk as much as possible. Advised if pain has not improved or worsens, contact office for further recommendations.

## 2022-02-13 ENCOUNTER — Other Ambulatory Visit (HOSPITAL_COMMUNITY): Payer: Self-pay

## 2022-02-16 ENCOUNTER — Other Ambulatory Visit (HOSPITAL_COMMUNITY): Payer: Self-pay

## 2022-02-16 ENCOUNTER — Telehealth: Payer: Self-pay | Admitting: *Deleted

## 2022-02-16 NOTE — Telephone Encounter (Signed)
Received call from patient [phone]~ telephone.   Surgical Date: 02/08/2022 Procedure: Lap Chole  Patient reports episode x2 on 02/15/2022 of black colored stools.   Denied abdominal pain, fatigue, dizziness, bright red blood per rectum. Denied changes in diet. Denies iron supplement use.  Discussed with Dr. Okey Dupre. Advised to monitor stool. If color continues, refer to GI for evaluation. Monitor for fatigue, lightheaded, dizzy, or having bright red blood per rectum. If noted, present to ER for evaluation.   Call placed to patient and patient made aware. Verbalized understanding.

## 2022-02-19 ENCOUNTER — Other Ambulatory Visit (HOSPITAL_COMMUNITY): Payer: Self-pay

## 2022-02-22 ENCOUNTER — Ambulatory Visit (INDEPENDENT_AMBULATORY_CARE_PROVIDER_SITE_OTHER): Payer: 59 | Admitting: Surgery

## 2022-02-22 DIAGNOSIS — Z09 Encounter for follow-up examination after completed treatment for conditions other than malignant neoplasm: Secondary | ICD-10-CM

## 2022-02-22 NOTE — Progress Notes (Signed)
[facility]  I am calling the patient for post operative evaluation. This is not a billable encounter as it is under the [place] charges for the surgery.  The patient had a laparoscopic cholecystectomy on 7/13. The patient reports that she has doing well. She is tolerating a diet, having good pain control, and having regular Bms.  The incisions are healing well; the skin glue is still in place.  I advised her that she can use antibiotic ointment to help dissolve away the glue. The patient has no concerns at this time.    Pathology: A. GALLBLADDER, CHOLECYSTECTOMY:  Chronic cholecystitis and cholelithiasis.  Negative for neoplasm.   Will see the patient PRN.   [name], DO [facility] [address] [place], [place] [address] [phone] (office)

## 2022-02-27 ENCOUNTER — Encounter: Payer: Self-pay | Admitting: Family Medicine

## 2022-02-27 ENCOUNTER — Encounter: Payer: Self-pay | Admitting: Rheumatology

## 2022-02-28 ENCOUNTER — Other Ambulatory Visit (HOSPITAL_COMMUNITY): Payer: Self-pay

## 2022-02-28 ENCOUNTER — Other Ambulatory Visit: Payer: Self-pay | Admitting: Physician Assistant

## 2022-02-28 DIAGNOSIS — M0579 Rheumatoid arthritis with rheumatoid factor of multiple sites without organ or systems involvement: Secondary | ICD-10-CM

## 2022-02-28 MED ORDER — ENBREL MINI 50 MG/ML ~~LOC~~ SOCT
50.0000 mg | SUBCUTANEOUS | 0 refills | Status: DC
  Filled 2022-02-28: qty 12, 84d supply, fill #0
  Filled 2022-03-13: qty 4, 28d supply, fill #0
  Filled 2022-04-10: qty 4, 28d supply, fill #1
  Filled 2022-05-15: qty 4, 28d supply, fill #2

## 2022-02-28 NOTE — Telephone Encounter (Signed)
Next Visit: 07/18/2022  Last Visit: 02/06/2022  Last Fill: 11/22/2021  DX: Rheumatoid arthritis involving multiple sites with positive rheumatoid factor   Current Dose per office note 02/06/2022: Enbrel 50 mg sq injection once weekly  Labs: 01/04/2022 Hgb 11.6, Glucose 112, Calcium 8.7, Albumin 3.4  TB Gold: 06/29/2021 Neg    Okay to refill Enbrel?

## 2022-03-06 ENCOUNTER — Ambulatory Visit: Payer: 59 | Admitting: Family Medicine

## 2022-03-06 ENCOUNTER — Other Ambulatory Visit (HOSPITAL_COMMUNITY): Payer: Self-pay

## 2022-03-06 VITALS — BP 132/72 | HR 91 | Temp 97.9°F | Wt 186.2 lb

## 2022-03-06 DIAGNOSIS — Z13220 Encounter for screening for lipoid disorders: Secondary | ICD-10-CM

## 2022-03-06 DIAGNOSIS — Z862 Personal history of diseases of the blood and blood-forming organs and certain disorders involving the immune mechanism: Secondary | ICD-10-CM

## 2022-03-06 DIAGNOSIS — R7309 Other abnormal glucose: Secondary | ICD-10-CM

## 2022-03-06 DIAGNOSIS — G4700 Insomnia, unspecified: Secondary | ICD-10-CM | POA: Diagnosis not present

## 2022-03-06 DIAGNOSIS — I10 Essential (primary) hypertension: Secondary | ICD-10-CM | POA: Diagnosis not present

## 2022-03-06 DIAGNOSIS — R634 Abnormal weight loss: Secondary | ICD-10-CM | POA: Diagnosis not present

## 2022-03-06 MED ORDER — ZOLPIDEM TARTRATE 5 MG PO TABS
5.0000 mg | ORAL_TABLET | Freq: Every evening | ORAL | 0 refills | Status: DC | PRN
  Filled 2022-03-06: qty 30, 30d supply, fill #0

## 2022-03-06 NOTE — Progress Notes (Signed)
Subjective:  Patient ID: Karen Burton, female    DOB: Jun 19, 1976  Age: 46 y.o. MRN: [id number]  CC: Chief Complaint  Patient presents with   Hypertension    Pt arrives for follow up. Pt had gallbladder taken out about 3 weeks ago. No issues with blood pressure. Pt states she has been losing weight and has not been trying to. Pt would like refill on Ambien.     HPI:  46 year old female with HTN, RA, fibromyalgia, insomnia presents for follow-up.  Patient has had a recent cholecystectomy.  She states that she is doing fairly well following surgery.  Still has some abdominal soreness but overall is doing better.  Patient states that she has had weight loss which has been unintentional.  Patient states that she weighed 210 pounds earlier in the year and is now down to 186.  I have reviewed the EMR.  Weight in December 2022 was 198.  Patient has made some dietary changes.  She is avoiding fatty and fried foods.  She is also avoiding soft drinks.  Patient reports ongoing heavy menstrual bleeding.  Has an upcoming appointment with OB/GYN.  Has known fibroids.  Has been experiencing some insomnia as of late.  Needs refill on Ambien.  Hypertension is stable on amlodipine, HCTZ, and losartan.   Patient Active Problem List   Diagnosis Date Noted   Weight loss 03/06/2022   Insomnia 09/06/2021   Fibromyalgia 11/06/2016   Uterine leiomyoma 12/20/2015   Essential hypertension 01/12/2013   Rheumatoid arthritis (HCC) 01/12/2013    Social Hx   Social History   Socioeconomic History   Marital status: Single    Spouse name: Not on file   Number of children: 0   Years of education: Not on file   Highest education level: Not on file  Occupational History   Not on file  Tobacco Use   Smoking status: Never    Passive exposure: Never   Smokeless tobacco: Never  Vaping Use   Vaping Use: Never used  Substance and Sexual Activity   Alcohol use: No   Drug use: Never   Sexual  activity: Not Currently    Birth control/protection: Pill  Other Topics Concern   Not on file  Social History Narrative   Not on file   Social Determinants of Health   Financial Resource Strain: Low Risk  (01/02/2021)   Overall Financial Resource Strain (CARDIA)    Difficulty of Paying Living Expenses: Not hard at all  Food Insecurity: No Food Insecurity (01/02/2021)   Hunger Vital Sign    Worried About Running Out of Food in the Last Year: Never true    Ran Out of Food in the Last Year: Never true  Transportation Needs: No Transportation Needs (01/02/2021)   PRAPARE - Administrator, Civil Service (Medical): No    Lack of Transportation (Non-Medical): No  Physical Activity: Insufficiently Active (01/02/2021)   Exercise Vital Sign    Days of Exercise per Week: 1 day    Minutes of Exercise per Session: 20 min  Stress: No Stress Concern Present (01/02/2021)   [employer] of Occupational Health - Occupational Stress Questionnaire    Feeling of Stress : Only a little  Social Connections: Socially Isolated (01/02/2021)   Social Connection and Isolation Panel [NHANES]    Frequency of Communication with Friends and Family: More than three times a week    Frequency of Social Gatherings with Friends and Family: More than  three times a week    Attends Religious Services: Never    Active Member of Clubs or Organizations: No    Attends Archivist Meetings: Never    Marital Status: Never married    Review of Systems Per HPI  Objective:  BP 132/72   Pulse 91   Temp 97.9 F (36.6 C)   Wt 186 lb 3.2 oz (84.5 kg)   LMP 12/09/2021 Comment: negative urine pregnancy test on 02/08/22  SpO2 99%   BMI 30.99 kg/m      03/06/2022    8:24 AM 02/08/2022   10:19 AM 02/08/2022   10:00 AM  BP/Weight  Systolic BP 673 419 379  Diastolic BP 72 78 80  Wt. (Lbs) 186.2    BMI 30.99 kg/m2      Physical Exam Vitals and nursing note reviewed.  Constitutional:      General: She is  not in acute distress.    Appearance: Normal appearance.  HENT:     Head: Normocephalic and atraumatic.  Eyes:     General:        Right eye: No discharge.        Left eye: No discharge.     Conjunctiva/sclera: Conjunctivae normal.  Cardiovascular:     Rate and Rhythm: Normal rate and regular rhythm.  Pulmonary:     Effort: Pulmonary effort is normal.     Breath sounds: Normal breath sounds. No wheezing, rhonchi or rales.  Abdominal:     General: There is no distension.     Palpations: Abdomen is soft.     Tenderness: There is no abdominal tenderness.  Neurological:     Mental Status: She is alert.  Psychiatric:        Mood and Affect: Mood normal.        Behavior: Behavior normal.     Lab Results  Component Value Date   WBC 8.4 01/04/2022   HGB 11.6 (L) 01/04/2022   HCT 36.2 01/04/2022   PLT 272 01/04/2022   GLUCOSE 112 (H) 01/04/2022   CHOL 135 02/09/2021   TRIG 91 02/09/2021   HDL 44 02/09/2021   LDLCALC 74 02/09/2021   ALT 29 01/04/2022   AST 35 01/04/2022   NA 138 01/04/2022   K 3.5 01/04/2022   CL 104 01/04/2022   CREATININE 0.93 01/04/2022   BUN 17 01/04/2022   CO2 23 01/04/2022   TSH 1.390 11/19/2019     Assessment & Plan:   Problem List Items Addressed This Visit       Cardiovascular and Mediastinum   Essential hypertension - Primary    BP stable.  Continue current medications.      Relevant Orders   CMP14+EGFR     Other   Insomnia    Worse as of late.  Ambien refilled.      Weight loss    Work-up today with laboratory studies.  Exam benign.      Relevant Orders   TSH   Other Visit Diagnoses     History of anemia       Relevant Orders   CBC   Elevated glucose       Relevant Orders   Hemoglobin A1c   Screening, lipid       Relevant Orders   Lipid panel       Meds ordered this encounter  Medications   zolpidem (AMBIEN) 5 MG tablet    Sig: Take 1 tablet (5 mg total) by mouth at  bedtime as needed for sleep.    Dispense:   30 tablet    Refill:  0    Follow-up:  Return in about 6 months (around 09/06/2022).  [place]

## 2022-03-06 NOTE — Assessment & Plan Note (Signed)
Work-up today with laboratory studies.  Exam benign.

## 2022-03-06 NOTE — Assessment & Plan Note (Signed)
BP stable.  Continue current medications. 

## 2022-03-06 NOTE — Assessment & Plan Note (Signed)
Worse as of late.  Ambien refilled.

## 2022-03-06 NOTE — Patient Instructions (Signed)
Labs today.  Continue your current medications.  We will call with results.  Follow up in 6 months. Be sure to get your mammogram.  Take care  Dr. Lacinda Axon

## 2022-03-07 ENCOUNTER — Other Ambulatory Visit: Payer: Self-pay | Admitting: Adult Health

## 2022-03-07 LAB — CBC
Hematocrit: 37.5 % (ref 34.0–46.6)
Hemoglobin: 12.7 g/dL (ref 11.1–15.9)
MCH: 28.9 pg (ref 26.6–33.0)
MCHC: 33.9 g/dL (ref 31.5–35.7)
MCV: 85 fL (ref 79–97)
Platelets: 284 10*3/uL (ref 150–450)
RBC: 4.39 x10E6/uL (ref 3.77–5.28)
RDW: 13 % (ref 11.7–15.4)
WBC: 5.5 10*3/uL (ref 3.4–10.8)

## 2022-03-07 LAB — CMP14+EGFR
ALT: 27 IU/L (ref 0–32)
AST: 22 IU/L (ref 0–40)
Albumin/Globulin Ratio: 1.3 (ref 1.2–2.2)
Albumin: 4 g/dL (ref 3.9–4.9)
Alkaline Phosphatase: 76 IU/L (ref 44–121)
BUN/Creatinine Ratio: 11 (ref 9–23)
BUN: 11 mg/dL (ref 6–24)
Bilirubin Total: 0.2 mg/dL (ref 0.0–1.2)
CO2: 22 mmol/L (ref 20–29)
Calcium: 9.5 mg/dL (ref 8.7–10.2)
Chloride: 102 mmol/L (ref 96–106)
Creatinine, Ser: 1.02 mg/dL — ABNORMAL HIGH (ref 0.57–1.00)
Globulin, Total: 3.1 g/dL (ref 1.5–4.5)
Glucose: 92 mg/dL (ref 70–99)
Potassium: 4.1 mmol/L (ref 3.5–5.2)
Sodium: 140 mmol/L (ref 134–144)
Total Protein: 7.1 g/dL (ref 6.0–8.5)
eGFR: 69 mL/min/{1.73_m2} (ref >59)

## 2022-03-07 LAB — LIPID PANEL
Chol/HDL Ratio: 2.8 ratio (ref 0.0–4.4)
Cholesterol, Total: 139 mg/dL (ref 100–199)
HDL: 50 mg/dL (ref >39)
LDL Chol Calc (NIH): 66 mg/dL (ref 0–99)
Triglycerides: 134 mg/dL (ref 0–149)
VLDL Cholesterol Cal: 23 mg/dL (ref 5–40)

## 2022-03-07 LAB — HEMOGLOBIN A1C
Est. average glucose Bld gHb Est-mCnc: 111 mg/dL
Hgb A1c MFr Bld: 5.5 % (ref 4.8–5.6)

## 2022-03-07 LAB — TSH: TSH: 3.19 u[IU]/mL (ref 0.450–4.500)

## 2022-03-08 ENCOUNTER — Other Ambulatory Visit (HOSPITAL_COMMUNITY): Payer: Self-pay

## 2022-03-08 MED ORDER — JUNEL FE 24 1-20 MG-MCG(24) PO TABS
ORAL_TABLET | ORAL | 4 refills | Status: DC
  Filled 2022-03-08: qty 84, 84d supply, fill #0

## 2022-03-13 ENCOUNTER — Other Ambulatory Visit (HOSPITAL_COMMUNITY): Payer: Self-pay

## 2022-03-15 ENCOUNTER — Ambulatory Visit: Payer: 59 | Admitting: Adult Health

## 2022-03-20 ENCOUNTER — Other Ambulatory Visit (HOSPITAL_COMMUNITY): Payer: Self-pay

## 2022-04-05 ENCOUNTER — Other Ambulatory Visit: Payer: Self-pay | Admitting: Physician Assistant

## 2022-04-06 ENCOUNTER — Other Ambulatory Visit (HOSPITAL_COMMUNITY): Payer: Self-pay

## 2022-04-06 MED ORDER — DULOXETINE HCL 60 MG PO CPEP
ORAL_CAPSULE | Freq: Every day | ORAL | 2 refills | Status: DC
  Filled 2022-04-06: qty 30, 30d supply, fill #0
  Filled 2022-05-07: qty 30, 30d supply, fill #1
  Filled 2022-06-02: qty 30, 30d supply, fill #2

## 2022-04-06 NOTE — Telephone Encounter (Signed)
Next Visit: 07/18/2022   Last Visit: 02/06/2022   Last Fill:  Dx: Fibromyalgia:  Current Dose per office note 02/06/2022: not discussed  Okay to refill Cymbalta?

## 2022-04-09 ENCOUNTER — Ambulatory Visit: Payer: 59 | Admitting: Dermatology

## 2022-04-09 ENCOUNTER — Ambulatory Visit: Payer: 59 | Admitting: Obstetrics & Gynecology

## 2022-04-10 ENCOUNTER — Other Ambulatory Visit (HOSPITAL_COMMUNITY): Payer: Self-pay

## 2022-04-10 ENCOUNTER — Other Ambulatory Visit: Payer: Self-pay

## 2022-04-13 ENCOUNTER — Other Ambulatory Visit: Payer: Self-pay

## 2022-04-17 ENCOUNTER — Other Ambulatory Visit (HOSPITAL_COMMUNITY): Payer: Self-pay

## 2022-05-02 ENCOUNTER — Telehealth: Payer: Self-pay

## 2022-05-02 NOTE — Telephone Encounter (Signed)
[facility] called requesting a verbal order to replace the Enbrel mini auto touch device. Spoke with pharmacist Estill Bamberg and provided verbal order to issue a replacement auto touch device. They will get the device shipped out to the patient.

## 2022-05-07 ENCOUNTER — Other Ambulatory Visit (HOSPITAL_COMMUNITY): Payer: Self-pay

## 2022-05-08 ENCOUNTER — Other Ambulatory Visit (HOSPITAL_COMMUNITY): Payer: Self-pay

## 2022-05-15 ENCOUNTER — Other Ambulatory Visit (HOSPITAL_COMMUNITY): Payer: Self-pay

## 2022-05-15 ENCOUNTER — Other Ambulatory Visit (HOSPITAL_COMMUNITY)
Admission: RE | Admit: 2022-05-15 | Discharge: 2022-05-15 | Disposition: A | Discharge status: Home / Self Care | Payer: 59 | Source: Ambulatory Visit | Attending: Obstetrics & Gynecology | Admitting: Obstetrics & Gynecology

## 2022-05-15 ENCOUNTER — Encounter: Payer: Self-pay | Admitting: Adult Health

## 2022-05-15 ENCOUNTER — Ambulatory Visit (INDEPENDENT_AMBULATORY_CARE_PROVIDER_SITE_OTHER): Payer: 59 | Admitting: Adult Health

## 2022-05-15 VITALS — BP 130/93 | HR 83 | Ht 65.0 in | Wt 192.5 lb

## 2022-05-15 DIAGNOSIS — Z1231 Encounter for screening mammogram for malignant neoplasm of breast: Secondary | ICD-10-CM

## 2022-05-15 DIAGNOSIS — Z3041 Encounter for surveillance of contraceptive pills: Secondary | ICD-10-CM

## 2022-05-15 DIAGNOSIS — Z1211 Encounter for screening for malignant neoplasm of colon: Secondary | ICD-10-CM | POA: Insufficient documentation

## 2022-05-15 DIAGNOSIS — Z01419 Encounter for gynecological examination (general) (routine) without abnormal findings: Secondary | ICD-10-CM | POA: Diagnosis not present

## 2022-05-15 DIAGNOSIS — K649 Unspecified hemorrhoids: Secondary | ICD-10-CM | POA: Insufficient documentation

## 2022-05-15 LAB — HEMOCCULT GUIAC POC 1CARD (OFFICE): Fecal Occult Blood, POC: NEGATIVE

## 2022-05-15 MED ORDER — JUNEL FE 24 1-20 MG-MCG(24) PO TABS
ORAL_TABLET | ORAL | 4 refills | Status: DC
  Filled 2022-05-15 – 2022-05-16 (×2): qty 84, 84d supply, fill #0
  Filled 2022-08-21: qty 84, 84d supply, fill #1
  Filled 2022-11-09: qty 84, 84d supply, fill #2
  Filled 2023-02-01: qty 84, 84d supply, fill #3
  Filled 2023-04-17: qty 84, 84d supply, fill #4

## 2022-05-15 NOTE — Progress Notes (Signed)
Patient ID: Karen Burton, female   DOB: 05-10-1976, 46 y.o.   MRN: [id number] History of Present Illness: Karen Burton is a 46 year old black female,single, G1P0010, in for a well woman gyn exam and pap. She had Gallbladder out recently, had pizza Sunday and then had diarrhea.   PCP is Dr Lacinda Axon.   Current Medications, Allergies, Past Medical History, Past Surgical History, Family History and Social History were reviewed in [employer] record.     Review of Systems:  Patient denies any headaches, hearing loss, fatigue, blurred vision, shortness of breath, chest pain, abdominal pain, problems with bowel movements, urination, or intercourse(not active). No joint pain or mood swings.    Physical Exam:BP (!) 130/93 (BP Location: Right Arm, Patient Position: Sitting, Cuff Size: Normal)   Pulse 83   Ht '5\' 5"'$  (1.651 m)   Wt 192 lb 8 oz (87.3 kg)   LMP 02/06/2022   BMI 32.03 kg/m   General:  Well developed, well nourished, no acute distress Skin:  Warm and dry Neck:  Midline trachea, normal thyroid, good ROM, no lymphadenopathy Lungs; Clear to auscultation bilaterally Breast:  No dominant palpable mass, retraction, or nipple discharge Cardiovascular: Regular rate and rhythm Abdomen:  Soft, non tender, no hepatosplenomegaly Pelvic:  External genitalia is normal in appearance, no lesions.  The vagina is normal in appearance. Urethra has no lesions or masses. The cervix is smooth, pap with HR HPV genotyping performed.  Uterus is felt to be normal size, shape, and contour.  No adnexal masses or tenderness noted.Bladder is non tender, no masses felt. Rectal: Good sphincter tone, no polyps, + hemorrhoids felt.  Hemoccult negative. Extremities/musculoskeletal:  No swelling or varicosities noted, no clubbing or cyanosis Psych:  No mood changes, alert and cooperative,seems happy AA is 0 Fall risk is low    05/15/2022    1:51 PM 02/13/2021    9:58 AM 01/02/2021   11:22 AM   Depression screen PHQ 2/9  Decreased Interest '1 1 1  '$ Down, Depressed, Hopeless 1 1 0  PHQ - 2 Score '2 2 1  '$ Altered sleeping '3 2 2  '$ Tired, decreased energy '3 2 3  '$ Change in appetite 0 0 0  Feeling bad or failure about yourself  1 1 0  Trouble concentrating 1 1 0  Moving slowly or fidgety/restless 0 0 0  Suicidal thoughts 0 0   PHQ-9 Score '10 8 6  '$ Difficult doing work/chores  Somewhat difficult    She is on Cymbalta and trazodone    05/15/2022    1:52 PM 02/13/2021    9:58 AM 01/02/2021   11:22 AM 11/19/2019    9:06 AM  GAD 7 : Generalized Anxiety Score  Nervous, Anxious, on Edge 1 0 0 1  Control/stop worrying 2 0 0 1  Worry too much - different things 2 1 0 1  Trouble relaxing 2 1 0 1  Restless 1 0 0 1  Easily annoyed or irritable 2 1 0 1  Afraid - awful might happen 1 0 0 1  Total GAD 7 Score 11 3 0 7  Anxiety Difficulty  Somewhat difficult  Not difficult at all    Upstream - 05/15/22 1351       Pregnancy Intention Screening   Does the patient want to become pregnant in the next year? No    Does the patient's partner want to become pregnant in the next year? No    Would the patient like to discuss  contraceptive options today? No      Contraception Wrap Up   Current Method Oral Contraceptive    End Method Oral Contraceptive            Examination chaperoned by Levy Pupa LPN    Impression and Plan: 1. Encounter for gynecological examination with Papanicolaou smear of cervix Pap sent Pap in 3 years if normal Physical in 1 year Labs with PCP - Cytology - PAP( [facility]) Advised to get colonoscopy  2. Encounter for surveillance of contraceptive pills Happy with OCs will refill Meds ordered this encounter  Medications   Norethindrone Acetate-Ethinyl Estrad-FE (JUNEL FE 24) 1-20 MG-MCG(24) tablet    Sig: TAKE 1 TABLET BY MOUTH ONCE DAILY (USE NDC [phone])    Dispense:  84 tablet    Refill:  4    Order Specific Question:   Supervising Provider     Answer:   Elonda Husky, LUTHER H [[id number]]     3. Encounter for screening fecal occult blood testing Hemoccult was negative  - POCT occult blood stool  4. Hemorrhoids, unspecified hemorrhoid type Use hemorrhoid cream as needed   5. Screening mammogram for breast cancer Pt says she will call for appt - MM 3D SCREEN BREAST BILATERAL; Future

## 2022-05-16 ENCOUNTER — Other Ambulatory Visit (HOSPITAL_COMMUNITY): Payer: Self-pay

## 2022-05-17 ENCOUNTER — Other Ambulatory Visit (HOSPITAL_COMMUNITY): Payer: Self-pay

## 2022-05-18 ENCOUNTER — Other Ambulatory Visit (HOSPITAL_COMMUNITY): Payer: Self-pay

## 2022-05-18 LAB — CYTOLOGY - PAP
Adequacy: ABSENT
Comment: NEGATIVE
Diagnosis: NEGATIVE
High risk HPV: NEGATIVE

## 2022-05-21 ENCOUNTER — Telehealth: Payer: Self-pay | Admitting: Pharmacist

## 2022-05-21 NOTE — Telephone Encounter (Signed)
Called patient to schedule an appointment for the [employer] [facility]. I was unable to reach the patient so I left a HIPAA-compliant message requesting that the patient return my call.   Luke Van Ausdall, PharmD, BCACP, CPP Clinical Pharmacist [facility] [phone]  

## 2022-05-28 ENCOUNTER — Other Ambulatory Visit (HOSPITAL_COMMUNITY): Payer: Self-pay

## 2022-06-02 ENCOUNTER — Other Ambulatory Visit (HOSPITAL_COMMUNITY): Payer: Self-pay

## 2022-06-15 ENCOUNTER — Telehealth: Payer: Self-pay | Admitting: Pharmacist

## 2022-06-15 ENCOUNTER — Other Ambulatory Visit (HOSPITAL_COMMUNITY): Payer: Self-pay

## 2022-06-15 NOTE — Telephone Encounter (Signed)
Submitted a Prior Authorization RENEWAL request to [facility] for ENBREL via CoverMyMeds. Will update once we receive a response.  Key: [id number]  Knox Saliva, PharmD, MPH, BCPS, CPP Clinical Pharmacist (Rheumatology and Pulmonology)

## 2022-06-18 ENCOUNTER — Other Ambulatory Visit (HOSPITAL_COMMUNITY): Payer: Self-pay

## 2022-06-20 ENCOUNTER — Other Ambulatory Visit: Payer: Self-pay | Admitting: Physician Assistant

## 2022-06-20 ENCOUNTER — Other Ambulatory Visit (HOSPITAL_COMMUNITY): Payer: Self-pay

## 2022-06-20 DIAGNOSIS — M0579 Rheumatoid arthritis with rheumatoid factor of multiple sites without organ or systems involvement: Secondary | ICD-10-CM

## 2022-06-20 DIAGNOSIS — Z79899 Other long term (current) drug therapy: Secondary | ICD-10-CM

## 2022-06-20 MED ORDER — ENBREL MINI 50 MG/ML ~~LOC~~ SOCT
50.0000 mg | SUBCUTANEOUS | 0 refills | Status: DC
  Filled 2022-06-20: qty 4, 28d supply, fill #0

## 2022-06-20 NOTE — Telephone Encounter (Signed)
Next Visit: 07/18/2022   Last Visit: 02/06/2022   Last Fill: 02/28/2022   DX: Rheumatoid arthritis involving multiple sites with positive rheumatoid factor    Current Dose per office note 02/06/2022: Enbrel 50 mg sq injection once weekly   Labs: 03/06/2022 Labs normal except for mildly elevated creatinine.      TB Gold: 06/29/2021 Neg    Patient advised she is due to update labs. Patient states she update on Friday. (Lab Orders released.    Okay to refill Enbrel?

## 2022-06-22 ENCOUNTER — Other Ambulatory Visit: Payer: Self-pay | Admitting: Physician Assistant

## 2022-06-22 ENCOUNTER — Encounter: Payer: Self-pay | Admitting: Rheumatology

## 2022-06-22 NOTE — Telephone Encounter (Signed)
Received notification from [facility] regarding a prior authorization for ENBREL. Authorization has been APPROVED from 06/19/22 to 06/19/23.   Patient must continue to fill through [place]: [id number]   Authorization # [id number]  [employer] updated  Knox Saliva, PharmD, MPH, BCPS, CPP Clinical Pharmacist (Rheumatology and Pulmonology)

## 2022-06-25 ENCOUNTER — Other Ambulatory Visit (HOSPITAL_COMMUNITY): Payer: Self-pay

## 2022-06-25 MED ORDER — METHOCARBAMOL 500 MG PO TABS
500.0000 mg | ORAL_TABLET | Freq: Every day | ORAL | 0 refills | Status: DC | PRN
  Filled 2022-06-25: qty 20, 20d supply, fill #0

## 2022-06-25 NOTE — Telephone Encounter (Signed)
Next Visit: 08/01/2022  Last Visit: 02/06/2022  Last Fill: 02/06/2022  Dx: Fibromyalgia  Current Dose per office note on 02/06/2022: methocarbamol 500 mg 1 tablet daily as needed for muscle spasms   Okay to refill Methocarbamol?

## 2022-07-02 ENCOUNTER — Other Ambulatory Visit (HOSPITAL_COMMUNITY): Payer: Self-pay

## 2022-07-10 DIAGNOSIS — Z79899 Other long term (current) drug therapy: Secondary | ICD-10-CM | POA: Diagnosis not present

## 2022-07-11 LAB — CBC WITH DIFFERENTIAL/PLATELET
Absolute Monocytes: 571 cells/uL (ref 200–950)
Basophils Absolute: 31 cells/uL (ref 0–200)
Basophils Relative: 0.6 %
Eosinophils Absolute: 158 cells/uL (ref 15–500)
Eosinophils Relative: 3.1 %
HCT: 38.3 % (ref 35.0–45.0)
Hemoglobin: 13.2 g/dL (ref 11.7–15.5)
Lymphs Abs: 1566 cells/uL (ref 850–3900)
MCH: 30.5 pg (ref 27.0–33.0)
MCHC: 34.5 g/dL (ref 32.0–36.0)
MCV: 88.5 fL (ref 80.0–100.0)
MPV: 10.5 fL (ref 7.5–12.5)
Monocytes Relative: 11.2 %
Neutro Abs: 2774 cells/uL (ref 1500–7800)
Neutrophils Relative %: 54.4 %
Platelets: 239 10*3/uL (ref 140–400)
RBC: 4.33 10*6/uL (ref 3.80–5.10)
RDW: 12.3 % (ref 11.0–15.0)
Total Lymphocyte: 30.7 %
WBC: 5.1 10*3/uL (ref 3.8–10.8)

## 2022-07-11 LAB — COMPLETE METABOLIC PANEL WITH GFR
AG Ratio: 1.4 (calc) (ref 1.0–2.5)
ALT: 24 U/L (ref 6–29)
AST: 21 U/L (ref 10–35)
Albumin: 3.9 g/dL (ref 3.6–5.1)
Alkaline phosphatase (APISO): 60 U/L (ref 31–125)
BUN: 14 mg/dL (ref 7–25)
CO2: 27 mmol/L (ref 20–32)
Calcium: 9.2 mg/dL (ref 8.6–10.2)
Chloride: 104 mmol/L (ref 98–110)
Creat: 0.88 mg/dL (ref 0.50–0.99)
Globulin: 2.8 g/dL (calc) (ref 1.9–3.7)
Glucose, Bld: 90 mg/dL (ref 65–139)
Potassium: 4.1 mmol/L (ref 3.5–5.3)
Sodium: 139 mmol/L (ref 135–146)
Total Bilirubin: 0.4 mg/dL (ref 0.2–1.2)
Total Protein: 6.7 g/dL (ref 6.1–8.1)
eGFR: 82 mL/min/{1.73_m2} (ref >=60)

## 2022-07-11 NOTE — Progress Notes (Signed)
CBC and CMP WNL

## 2022-07-16 ENCOUNTER — Other Ambulatory Visit: Payer: Self-pay | Admitting: Physician Assistant

## 2022-07-17 ENCOUNTER — Other Ambulatory Visit (HOSPITAL_COMMUNITY): Payer: Self-pay

## 2022-07-17 MED ORDER — DULOXETINE HCL 60 MG PO CPEP
60.0000 mg | ORAL_CAPSULE | Freq: Every day | ORAL | 2 refills | Status: DC
  Filled 2022-07-17: qty 30, 30d supply, fill #0
  Filled 2022-08-21: qty 30, 30d supply, fill #1
  Filled 2022-09-20: qty 30, 30d supply, fill #2

## 2022-07-17 NOTE — Telephone Encounter (Signed)
Next Visit: 08/01/2022  Last Visit: 02/06/2022  Last Fill: 04/06/2022  Dx: Fibromyalgia  Current Dose per office note on 02/06/2022: not discussed  Okay to refill Cymbalta?

## 2022-07-18 ENCOUNTER — Ambulatory Visit: Payer: 59 | Admitting: Rheumatology

## 2022-07-19 NOTE — Progress Notes (Signed)
Office Visit Note  Patient: Karen Burton             Date of Birth: 12-Jun-1976           MRN: [id number]             PCP: Coral Spikes, DO Referring: Coral Spikes, DO Visit Date: 08/01/2022 Occupation: '@GUAROCC'$ @  Subjective:  Left shoulder joint pain   History of Present Illness: Karen Burton is a 46 y.o. female with history of seropositive rheumatoid arthritis and fibromyalgia.  Patient remains on Enbrel 50 mg sq injections once weekly. She continues to tolerate enbrel without any side effects.  She has not missed any doses recently. She denies any recent or recurrent infections.  She denies any signs or symptoms of a rheumatoid arthritis flare recently.  She states that she is having generalized myalgias and muscle tenderness due to fibromyalgia.  She is having some increased discomfort in the left shoulder and left elbow joint.  She describes the pain as a burning sensation.  She has been using a heating pad as well as pain patches for pain relief.  She remains on Cymbalta as prescribed.  She takes methocarbamol as needed for muscle spasms.  She denies any joint swelling.  She states she has been helping to act as the caregiver for her mother and has been under increased stress recently.  She states that her father also passed away at the beginning of December so she has been having some situational anxiety and depression.  She has been having difficulty sleeping at night despite taking trazodone and melatonin at bedtime. She denies any new medical conditions.     Activities of Daily Living:  Patient reports morning stiffness for 30 minutes.   Patient Reports nocturnal pain.  Difficulty dressing/grooming: Denies Difficulty climbing stairs: Reports Difficulty getting out of chair: Denies Difficulty using hands for taps, buttons, cutlery, and/or writing: Denies  Review of Systems  Constitutional:  Positive for fatigue.  HENT:  Positive for mouth dryness and  nose dryness. Negative for mouth sores.   Eyes:  Positive for dryness.  Respiratory:  Negative for shortness of breath.   Cardiovascular:  Negative for chest pain and palpitations.  Gastrointestinal:  Positive for constipation. Negative for blood in stool and diarrhea.  Endocrine: Negative for increased urination.  Genitourinary:  Negative for involuntary urination.  Musculoskeletal:  Positive for joint pain, joint pain, joint swelling, myalgias, morning stiffness, muscle tenderness and myalgias. Negative for gait problem and muscle weakness.  Skin:  Negative for color change, rash, hair loss and sensitivity to sunlight.  Allergic/Immunologic: Negative for susceptible to infections.  Neurological:  Positive for headaches. Negative for dizziness.  Hematological:  Negative for swollen glands.  Psychiatric/Behavioral:  Positive for sleep disturbance. Negative for depressed mood. The patient is not nervous/anxious.     PMFS History:  Patient Active Problem List   Diagnosis Date Noted   Hemorrhoids 05/15/2022   Encounter for screening fecal occult blood testing 05/15/2022   Weight loss 03/06/2022   Insomnia 09/06/2021   Encounter for gynecological examination with Papanicolaou smear of cervix 02/13/2019   Encounter for surveillance of contraceptive pills 08/12/2017   Fibromyalgia 11/06/2016   Uterine leiomyoma 12/20/2015   Essential hypertension 01/12/2013   Rheumatoid arthritis ([place]) 01/12/2013    Past Medical History:  Diagnosis Date   Anemia    Dysmenorrhea 12/20/2015   Eczema    mild   Fibroids 12/20/2015   Fibromyalgia  GERD (gastroesophageal reflux disease)    Hypertension    Menorrhagia with regular cycle 12/20/2015   PONV (postoperative nausea and vomiting)    Rash    Reflux    Rheumatoid arthritis (HCC)    Rhinitis    chonic   Vitamin D deficiency    Vulvar itching 12/20/2015    Family History  Problem Relation Age of Onset   Diabetes Mother    Hypertension  Mother    Asthma Mother    Cancer Mother        breast   Thyroid disease Mother    Arthritis Mother    Berenice Primas' disease Mother    Cancer Maternal Grandmother        pancreatic   Fibroids Sister    COPD Maternal Aunt    Asthma Maternal Aunt    Gout Maternal Aunt    Past Surgical History:  Procedure Laterality Date   CHOLECYSTECTOMY N/A 02/08/2022   Procedure: LAPAROSCOPIC CHOLECYSTECTOMY;  Surgeon: Rusty Aus, DO;  Location: [facility];  Service: General;  Laterality: N/A;   ESOPHAGOGASTRODUODENOSCOPY ENDOSCOPY     IR GENERIC HISTORICAL  02/07/2016   IR RADIOLOGIST EVAL & MGMT 02/07/2016 [name], MD [facility] RAD   WISDOM TOOTH EXTRACTION     Social History   Social History Narrative   Not on file   Immunization History  Administered Date(s) Administered   DT (Pediatric) 02/20/2013   Influenza-Unspecified 07/18/2009, 05/16/2016, 06/03/2017, 04/07/2018, 04/27/2021   PFIZER(Purple Top)SARS-COV-2 Vaccination 04/01/2020, 04/22/2020   Pneumococcal Polysaccharide-23 05/08/2007     Objective: Vital Signs: BP (!) 140/94 (BP Location: Left Arm, Patient Position: Sitting, Cuff Size: Normal)   Pulse 90   Resp 14   Ht 5' 5.5" (1.664 m)   Wt 193 lb 9.6 oz (87.8 kg)   BMI 31.73 kg/m    Physical Exam Vitals and nursing note reviewed.  Constitutional:      Appearance: She is well-developed.  HENT:     Head: Normocephalic and atraumatic.  Eyes:     Conjunctiva/sclera: Conjunctivae normal.  Cardiovascular:     Rate and Rhythm: Normal rate and regular rhythm.     Heart sounds: Normal heart sounds.  Pulmonary:     Effort: Pulmonary effort is normal.     Breath sounds: Normal breath sounds.  Abdominal:     General: Bowel sounds are normal.     Palpations: Abdomen is soft.  Musculoskeletal:     Cervical back: Normal range of motion.  Skin:    General: Skin is warm and dry.     Capillary Refill: Capillary refill takes less than 2 seconds.  Neurological:      Mental Status: She is alert and oriented to person, place, and time.  Psychiatric:        Behavior: Behavior normal.      Musculoskeletal Exam: Generalized hyperalgesia and positive tender points. C-spine, thoracic spine, and lumbar spine good ROM. Painful ROM of left shoulder.  Trapezius muscle tension tenderness bilaterally.  Wrist joints, MCPs, PIPs, and DIPs good ROM with no synovitis.  Complete fist formation bilaterally.  Tenderness over the lateral epicondyle of the left elbow.  Hip joints, knee joints, and ankle joints have good ROM with no discomfort.  No warmth or effusion of knee joints. No tenderness or swelling of ankle joints.   CDAI Exam: CDAI Score: -- Patient Global: 2 mm; Provider Global: 2 mm Swollen: --; Tender: -- Joint Exam 08/01/2022   No joint exam has been documented  for this visit   There is currently no information documented on the homunculus. Go to the Rheumatology activity and complete the homunculus joint exam.  Investigation: No additional findings.  Imaging: No results found.  Recent Labs: Lab Results  Component Value Date   WBC 5.1 07/10/2022   HGB 13.2 07/10/2022   PLT 239 07/10/2022   NA 139 07/10/2022   K 4.1 07/10/2022   CL 104 07/10/2022   CO2 27 07/10/2022   GLUCOSE 90 07/10/2022   BUN 14 07/10/2022   CREATININE 0.88 07/10/2022   BILITOT 0.4 07/10/2022   ALKPHOS 76 03/06/2022   AST 21 07/10/2022   ALT 24 07/10/2022   PROT 6.7 07/10/2022   ALBUMIN 4.0 03/06/2022   CALCIUM 9.2 07/10/2022   GFRAA 92 06/10/2020   QFTBGOLDPLUS Negative 06/29/2021    Speciality Comments: TB Gold: 05/07/2022 Neg   Prior therapy includes: methotrexate and Plaquenil (neutropenia) and Humira (painful injections)  Procedures:  No procedures performed Allergies: Ceftin [cefuroxime axetil], Humira [adalimumab], and Methotrexate derivatives   Assessment / Plan:     Visit Diagnoses: Rheumatoid arthritis involving multiple sites with positive rheumatoid  factor ([place]): She has no synovitis on examination today.  She has not had any signs or symptoms of a rheumatoid arthritis flare.  She has clinically been doing well on Enbrel 50 mg subcutaneous injections every week.  She continues to tolerate Enbrel without any side effects and has not missed any doses recently.  She has not had any recent or recurrent infections.  Her morning stiffness has been lasting about 30 minutes daily.  She has not had any active inflammation.  She will remain on Enbrel as monotherapy.  She was advised to notify us if she develops signs or symptoms of a flare.  She will follow-up in the office in 5 months or sooner if needed.  High risk medication use - Enbrel 50 mg sq injection once weekly.   No recent or recurrent infections.  Advised patient to hold enbrel if she develops signs or symptoms of an infection and to resume once the infection has completely cleared.   CBC and CMP WNL on 07/10/22. Her next lab work will be due in March and every 3 months.  TB gold negative on 05/07/22.   No new medical conditions.  Fibromyalgia: She has generalized hyperalgesia and positive tender points on examination.  She presents today with trapezius muscle tension and tenderness bilaterally, left greater than right.  She has been having some increased discomfort in the left shoulder and left elbow but has no active inflammation at this time.  She has tenderness over the lateral epicondyle of the left elbow on examination consistent with tendinitis.  She continues to have chronic fatigue secondary to insomnia.  She is having difficulty sleeping at night due to underlying anxiety and nocturnal pain.  She is been taking melatonin and trazodone at bedtime to help her sleep at night.  Discussed the importance of good sleep hygiene.  Patient was encouraged to try to go to bed and wake up at the same time every day.  I also discussed trying meditation at bedtime.  Other fatigue: Chronic and secondary to  insomnia.  Discussed the importance of regular exercise and good sleep hygiene.  History of insomnia: She has been taking melatonin and trazodone at bedtime.  She does not need a refill of trazodone at this time.  Discussed the importance of good sleep hygiene.  Other medical conditions are listed as follows:  History of gastroesophageal reflux (GERD)  Elevated LFTs - Within normal limits on 07/10/22.  History of hypertension  Elevated CK - CK was 144 on 03/15/2020.  Orders: No orders of the defined types were placed in this encounter.  No orders of the defined types were placed in this encounter.     Follow-Up Instructions: Return in about 5 months (around 12/31/2022) for Rheumatoid arthritis, Fibromyalgia.   Ofilia Neas, PA-C  Note - This record has been created using Dragon software.  Chart creation errors have been sought, but may not always  have been located. Such creation errors do not reflect on  the standard of medical care.

## 2022-07-24 ENCOUNTER — Other Ambulatory Visit (HOSPITAL_COMMUNITY): Payer: Self-pay

## 2022-07-25 ENCOUNTER — Other Ambulatory Visit: Payer: Self-pay | Admitting: Rheumatology

## 2022-07-25 ENCOUNTER — Other Ambulatory Visit (HOSPITAL_COMMUNITY): Payer: Self-pay

## 2022-07-25 DIAGNOSIS — M0579 Rheumatoid arthritis with rheumatoid factor of multiple sites without organ or systems involvement: Secondary | ICD-10-CM

## 2022-07-25 MED ORDER — ENBREL MINI 50 MG/ML ~~LOC~~ SOCT
50.0000 mg | SUBCUTANEOUS | 2 refills | Status: DC
  Filled 2022-07-25: qty 4, 28d supply, fill #0
  Filled 2022-08-21: qty 4, 28d supply, fill #1
  Filled 2022-10-08: qty 4, 28d supply, fill #2

## 2022-07-25 NOTE — Telephone Encounter (Signed)
Next Visit: 08/01/2022  Last Visit: 02/06/2022  Last Fill: 05/31/2022  [id number] arthritis involving multiple sites with positive rheumatoid factor   Current Dose per office note 02/06/2022: Enbrel 50 mg sq injection once weekly.   Labs: 07/10/2022 CBC and CMP WNL   TB Gold: 06/29/2021   TB Gold is negative   Okay to refill Enbrel?

## 2022-07-31 ENCOUNTER — Other Ambulatory Visit (HOSPITAL_COMMUNITY): Payer: Self-pay

## 2022-07-31 ENCOUNTER — Other Ambulatory Visit: Payer: Self-pay

## 2022-08-01 ENCOUNTER — Ambulatory Visit: Payer: Commercial Managed Care - PPO | Attending: Rheumatology | Admitting: Physician Assistant

## 2022-08-01 ENCOUNTER — Encounter: Payer: Self-pay | Admitting: Physician Assistant

## 2022-08-01 VITALS — BP 140/94 | HR 90 | Resp 14 | Ht 65.5 in | Wt 193.6 lb

## 2022-08-01 DIAGNOSIS — M0579 Rheumatoid arthritis with rheumatoid factor of multiple sites without organ or systems involvement: Secondary | ICD-10-CM | POA: Diagnosis not present

## 2022-08-01 DIAGNOSIS — R5383 Other fatigue: Secondary | ICD-10-CM | POA: Diagnosis not present

## 2022-08-01 DIAGNOSIS — M797 Fibromyalgia: Secondary | ICD-10-CM

## 2022-08-01 DIAGNOSIS — R7989 Other specified abnormal findings of blood chemistry: Secondary | ICD-10-CM | POA: Diagnosis not present

## 2022-08-01 DIAGNOSIS — R748 Abnormal levels of other serum enzymes: Secondary | ICD-10-CM

## 2022-08-01 DIAGNOSIS — Z87898 Personal history of other specified conditions: Secondary | ICD-10-CM | POA: Diagnosis not present

## 2022-08-01 DIAGNOSIS — Z79899 Other long term (current) drug therapy: Secondary | ICD-10-CM | POA: Diagnosis not present

## 2022-08-01 DIAGNOSIS — Z8679 Personal history of other diseases of the circulatory system: Secondary | ICD-10-CM | POA: Diagnosis not present

## 2022-08-01 DIAGNOSIS — Z8719 Personal history of other diseases of the digestive system: Secondary | ICD-10-CM | POA: Diagnosis not present

## 2022-08-01 NOTE — Patient Instructions (Signed)
Standing Labs We placed an order today for your standing lab work.   Please have your standing labs drawn in March and every 3 months   Please have your labs drawn 2 weeks prior to your appointment so that the provider can discuss your lab results at your appointment.  Please note that you may see your imaging and lab results in MyChart before we have reviewed them. We will contact you once all results are reviewed. Please allow our office up to 72 hours to thoroughly review all of the results before contacting the office for clarification of your results.  Lab hours are:   Monday through Thursday from 8:00 am -12:30 pm and 1:00 pm-5:00 pm and Friday from 8:00 am-12:00 pm.  Please be advised, all patients with office appointments requiring lab work will take precedent over walk-in lab work.   Labs are drawn by [facility]. Please bring your co-pay at the time of your lab draw.  You may receive a bill from [facility] for your lab work.  Please note if you are on Hydroxychloroquine and and an order has been placed for a Hydroxychloroquine level, you will need to have it drawn 4 hours or more after your last dose.  If you wish to have your labs drawn at another location, please call the office 24 hours in advance so we can fax the orders.  The office is located at [address], [address], [place], [place] [address] No appointment is necessary.    If you have any questions regarding directions or hours of operation,  please call [phone].   As a reminder, please drink plenty of water prior to coming for your lab work. Thanks!  If you have signs or symptoms of an infection or start antibiotics: First, call your PCP for workup of your infection. Hold your medication through the infection, until you complete your antibiotics, and until symptoms resolve if you take the following: Injectable medication (Actemra, Benlysta, Cimzia, Cosentyx, Enbrel, Humira, Kevzara, Orencia, Remicade, Simponi,  Stelara, Taltz, Tremfya) Methotrexate Leflunomide (Arava) Mycophenolate (Cellcept) Xeljanz, Olumiant, or Rinvoq   Vaccines You are taking a medication(s) that can suppress your immune system.  The following immunizations are recommended: Flu annually Covid-19  Td/Tdap (tetanus, diphtheria, pertussis) every 10 years Pneumonia (Prevnar 15 then Pneumovax 23 at least 1 year apart.  Alternatively, can take Prevnar 20 without needing additional dose) Shingrix: 2 doses from 4 weeks to 6 months apart  Please check with your PCP to make sure you are up to date.   

## 2022-08-08 ENCOUNTER — Other Ambulatory Visit (HOSPITAL_COMMUNITY): Payer: Self-pay

## 2022-08-12 ENCOUNTER — Ambulatory Visit
Admission: EM | Admit: 2022-08-12 | Discharge: 2022-08-12 | Disposition: A | Discharge status: Home / Self Care | Payer: Commercial Managed Care - PPO | Attending: Family Medicine | Admitting: Family Medicine

## 2022-08-12 DIAGNOSIS — S39012A Strain of muscle, fascia and tendon of lower back, initial encounter: Secondary | ICD-10-CM | POA: Diagnosis not present

## 2022-08-12 MED ORDER — METHOCARBAMOL 500 MG PO TABS: 500.0000 mg | ORAL_TABLET | Freq: Two times a day (BID) | ORAL | 0 refills | Status: DC | PRN

## 2022-08-12 MED ORDER — PREDNISONE 20 MG PO TABS: 40.0000 mg | ORAL_TABLET | Freq: Every day | ORAL | 0 refills | Status: DC

## 2022-08-12 NOTE — ED Provider Notes (Signed)
[facility]    CSN: [id number] Arrival date & time: 08/12/22  6063      History   Chief Complaint No chief complaint on file.   HPI Karen Burton is a 47 y.o. female.   Patient presenting today with 3-day history of right lateral low back pain extending down into her right posterior buttock and hip region.  States symptoms started directly after trying to lift her mother into a wheelchair 3 days ago.  She denies radiation of pain down the leg, numbness, weakness, tingling, bowel or bladder incontinence, saddle anesthesias.  Has been taking Aleve, Tylenol, leftover oxycodone and some Robaxin with mild relief of symptoms but is concerned because it is not fully going away.    Past Medical History:  Diagnosis Date   Anemia    Dysmenorrhea 12/20/2015   Eczema    mild   Fibroids 12/20/2015   Fibromyalgia    GERD (gastroesophageal reflux disease)    Hypertension    Menorrhagia with regular cycle 12/20/2015   PONV (postoperative nausea and vomiting)    Rash    Reflux    Rheumatoid arthritis ([place])    Rhinitis    chonic   Vitamin D deficiency    Vulvar itching 12/20/2015    Patient Active Problem List   Diagnosis Date Noted   Hemorrhoids 05/15/2022   Encounter for screening fecal occult blood testing 05/15/2022   Weight loss 03/06/2022   Insomnia 09/06/2021   Encounter for gynecological examination with Papanicolaou smear of cervix 02/13/2019   Encounter for surveillance of contraceptive pills 08/12/2017   Fibromyalgia 11/06/2016   Uterine leiomyoma 12/20/2015   Essential hypertension 01/12/2013   Rheumatoid arthritis ([place]) 01/12/2013    Past Surgical History:  Procedure Laterality Date   CHOLECYSTECTOMY N/A 02/08/2022   Procedure: LAPAROSCOPIC CHOLECYSTECTOMY;  Surgeon: Rusty Aus, DO;  Location: [facility];  Service: General;  Laterality: N/A;   ESOPHAGOGASTRODUODENOSCOPY ENDOSCOPY     IR GENERIC HISTORICAL  02/07/2016   IR  RADIOLOGIST EVAL & MGMT 02/07/2016 [name], MD [facility] INTERV RAD   WISDOM TOOTH EXTRACTION      OB History     Gravida  1   Para      Term      Preterm      AB  1   Living         SAB      IAB      Ectopic      Multiple      Live Births               Home Medications    Prior to Admission medications   Medication Sig Start Date End Date Taking? Authorizing Provider  methocarbamol (ROBAXIN) 500 MG tablet Take 1 tablet (500 mg total) by mouth 2 (two) times daily as needed for muscle spasms. 08/12/22  Yes Volney American, PA-C  predniSONE (DELTASONE) 20 MG tablet Take 2 tablets (40 mg total) by mouth daily with breakfast. 08/12/22  Yes Volney American, PA-C  amLODipine (NORVASC) 5 MG tablet TAKE 1 TABLET BY MOUTH DAILY FOR BLOOD PRESSURE 09/06/21 09/06/22  Coral Spikes, DO  docusate sodium (COLACE) 100 MG capsule Take 100 mg by mouth daily.     [provider]  DULoxetine (CYMBALTA) 60 MG capsule Take 1 capsule (60 mg total) by mouth daily. 07/17/22   Ofilia Neas, PA-C  Etanercept (ENBREL MINI) 50 MG/ML SOCT Inject 50 mg into the skin  once a week. 07/25/22   Ofilia Neas, PA-C  hydrochlorothiazide (HYDRODIURIL) 12.5 MG tablet TAKE 1 TABLET BY MOUTH DAILY. 09/06/21 09/06/22  Coral Spikes, DO  levocetirizine (XYZAL) 5 MG tablet Take 5 mg by mouth every other day.    [provider]  losartan (COZAAR) 50 MG tablet TAKE 1 TABLET BY MOUTH DAILY. 09/06/21   Thersa Salt G, DO  melatonin 5 MG TABS Take 5 mg by mouth at bedtime as needed (sleep).    [provider]  Multiple Vitamins-Minerals (MULTIVITAMIN WOMEN PO) Take 1 tablet by mouth daily.    [provider]  Norethindrone Acetate-Ethinyl Estrad-FE (JUNEL FE 24) 1-20 MG-MCG(24) tablet TAKE 1 TABLET BY MOUTH ONCE DAILY (USE NDC [address]-25) 05/15/22 05/15/23  Estill Dooms, NP  traZODone (DESYREL) 50 MG tablet Take 1/2 tablet (25 mg total) by mouth at bedtime as  needed. 09/06/21   Ofilia Neas, PA-C  zolpidem (AMBIEN) 5 MG tablet Take 1 tablet (5 mg total) by mouth at bedtime as needed for sleep. 03/06/22   Coral Spikes, DO    Family History Family History  Problem Relation Age of Onset   Diabetes Mother    Hypertension Mother    Asthma Mother    Cancer Mother        breast   Thyroid disease Mother    Arthritis Mother    Berenice Primas' disease Mother    Cancer Maternal Grandmother        pancreatic   Fibroids Sister    COPD Maternal Aunt    Asthma Maternal Aunt    Gout Maternal Aunt     Social History Social History   Tobacco Use   Smoking status: Never    Passive exposure: Never   Smokeless tobacco: Never  Vaping Use   Vaping Use: Never used  Substance Use Topics   Alcohol use: No   Drug use: Never     Allergies   Ceftin [cefuroxime axetil], Humira [adalimumab], and Methotrexate derivatives   Review of Systems Review of Systems Per HPI  Physical Exam Triage Vital Signs ED Triage Vitals  Enc Vitals Group     BP 08/12/22 1031 131/85     Pulse Rate 08/12/22 1031 84     Resp 08/12/22 1031 20     Temp 08/12/22 1031 98 F (36.7 C)     Temp Source 08/12/22 1031 Oral     SpO2 08/12/22 1031 98 %     Weight --      Height --      Head Circumference --      Peak Flow --      Pain Score 08/12/22 1038 5     Pain Loc --      Pain Edu? --      Excl. in [place]? --    No data found.  Updated Vital Signs BP 131/85 (BP Location: Right Arm)   Pulse 84   Temp 98 F (36.7 C) (Oral)   Resp 20   LMP 02/05/2022 (Exact Date)   SpO2 98%   Visual Acuity Right Eye Distance:   Left Eye Distance:   Bilateral Distance:    Right Eye Near:   Left Eye Near:    Bilateral Near:     Physical Exam Vitals and nursing note reviewed.  Constitutional:      Appearance: Normal appearance. She is not ill-appearing.  HENT:     Head: Atraumatic.     Mouth/Throat:  Mouth: Mucous membranes are moist.  Eyes:     Extraocular Movements:  Extraocular movements intact.     Conjunctiva/sclera: Conjunctivae normal.  Cardiovascular:     Rate and Rhythm: Normal rate and regular rhythm.     Heart sounds: Normal heart sounds.  Pulmonary:     Effort: Pulmonary effort is normal.     Breath sounds: Normal breath sounds.  Musculoskeletal:        General: Normal range of motion.     Cervical back: Normal range of motion and neck supple.     Comments: Right lateral lumbar musculature tender to palpation and mild spasm.  Negative straight leg raise bilateral lower extremities.  Normal gait and range of motion  Skin:    General: Skin is warm and dry.  Neurological:     Mental Status: She is alert and oriented to person, place, and time.     Motor: No weakness.     Gait: Gait normal.     Comments: Bilateral lower extremities neurovascularly intact  Psychiatric:        Mood and Affect: Mood normal.        Thought Content: Thought content normal.        Judgment: Judgment normal.      UC Treatments / Results  Labs (all labs ordered are listed, but only abnormal results are displayed) Labs Reviewed - No data to display  EKG   Radiology No results found.  Procedures Procedures (including critical care time)  Medications Ordered in UC Medications - No data to display  Initial Impression / Assessment and Plan / UC Course  I have reviewed the triage vital signs and the nursing notes.  Pertinent labs & imaging results that were available during my care of the patient were reviewed by me and considered in my medical decision making (see chart for details).     Will treat with prednisone, Robaxin, supportive over-the-counter medications and home care as before.  No red flag findings today.  Consistent with muscular strain.  Return for worsening symptoms.  Final Clinical Impressions(s) / UC Diagnoses   Final diagnoses:  Strain of lumbar region, initial encounter   Discharge Instructions   None    ED Prescriptions      Medication Sig Dispense Auth. Provider   methocarbamol (ROBAXIN) 500 MG tablet Take 1 tablet (500 mg total) by mouth 2 (two) times daily as needed for muscle spasms. 10 tablet Volney American, [place]   predniSONE (DELTASONE) 20 MG tablet Take 2 tablets (40 mg total) by mouth daily with breakfast. 10 tablet Volney American, [place]      PDMP not reviewed this encounter.   Volney American, [place] 08/12/22 1130

## 2022-08-12 NOTE — ED Triage Notes (Signed)
Pt reports some low right hip and back back pain x 3 days. Difficulty raising her right leg up and bending down due to hip pain. Has a pulling feeling. Took aleve, tylenol, oxycodone left overs, and robaxin but no relief

## 2022-08-16 ENCOUNTER — Other Ambulatory Visit (HOSPITAL_COMMUNITY): Payer: Self-pay

## 2022-08-17 ENCOUNTER — Other Ambulatory Visit (HOSPITAL_COMMUNITY): Payer: Self-pay

## 2022-08-21 ENCOUNTER — Other Ambulatory Visit: Payer: Self-pay

## 2022-08-21 ENCOUNTER — Other Ambulatory Visit (HOSPITAL_COMMUNITY): Payer: Self-pay

## 2022-08-22 ENCOUNTER — Other Ambulatory Visit: Payer: Self-pay

## 2022-08-23 ENCOUNTER — Other Ambulatory Visit (HOSPITAL_COMMUNITY): Payer: Self-pay

## 2022-08-23 DIAGNOSIS — M62830 Muscle spasm of back: Secondary | ICD-10-CM | POA: Diagnosis not present

## 2022-08-23 DIAGNOSIS — Z6831 Body mass index (BMI) 31.0-31.9, adult: Secondary | ICD-10-CM | POA: Diagnosis not present

## 2022-08-23 DIAGNOSIS — E669 Obesity, unspecified: Secondary | ICD-10-CM | POA: Diagnosis not present

## 2022-08-23 MED ORDER — CYCLOBENZAPRINE HCL 10 MG PO TABS
10.0000 mg | ORAL_TABLET | Freq: Three times a day (TID) | ORAL | 0 refills | Status: DC
  Filled 2022-08-23: qty 6, 2d supply, fill #0

## 2022-08-23 MED ORDER — METHYLPREDNISOLONE 4 MG PO TBPK
ORAL_TABLET | ORAL | 0 refills | Status: DC
  Filled 2022-08-23: qty 21, 6d supply, fill #0

## 2022-08-29 ENCOUNTER — Ambulatory Visit: Payer: Commercial Managed Care - PPO | Admitting: Physician Assistant

## 2022-08-29 ENCOUNTER — Other Ambulatory Visit (HOSPITAL_COMMUNITY): Payer: Self-pay

## 2022-08-29 ENCOUNTER — Telehealth: Payer: Self-pay | Admitting: Physician Assistant

## 2022-08-29 ENCOUNTER — Ambulatory Visit (INDEPENDENT_AMBULATORY_CARE_PROVIDER_SITE_OTHER): Payer: Commercial Managed Care - PPO

## 2022-08-29 ENCOUNTER — Encounter: Payer: Self-pay | Admitting: Physician Assistant

## 2022-08-29 DIAGNOSIS — M5442 Lumbago with sciatica, left side: Secondary | ICD-10-CM

## 2022-08-29 DIAGNOSIS — M79605 Pain in left leg: Secondary | ICD-10-CM

## 2022-08-29 DIAGNOSIS — M5441 Lumbago with sciatica, right side: Secondary | ICD-10-CM | POA: Diagnosis not present

## 2022-08-29 MED ORDER — TRAMADOL HCL 50 MG PO TABS
50.0000 mg | ORAL_TABLET | Freq: Four times a day (QID) | ORAL | 0 refills | Status: DC | PRN
  Filled 2022-08-29: qty 30, 8d supply, fill #0

## 2022-08-29 MED ORDER — MELOXICAM 15 MG PO TABS
15.0000 mg | ORAL_TABLET | Freq: Every day | ORAL | 0 refills | Status: DC
  Filled 2022-08-29: qty 30, 30d supply, fill #0

## 2022-08-29 NOTE — Progress Notes (Addendum)
Office Visit Note   Patient: Karen Burton           Date of Birth: Jul 17, 1976           MRN: [id number] Visit Date: 08/29/2022              Requested by: Coral Spikes, DO [place],  [place] [address] PCP: Coral Spikes, DO  Chief Complaint  Patient presents with   Lower Back - Pain      HPI: Karen Burton comes in today with a 2-1/2-week history of acute back pain.  She said that approximately 3 weeks ago she was helping her mom into a wheelchair when she had immediate onset of right-sided low back pain that shot down her right leg this was significant enough that she had difficult moving around.  Denies any loss of bowel or bladder control.  She has tried Flexeril and Robaxin without relief.  She is also done 2 rounds of prednisone without any relief in her symptoms.  Pain is significant enough that it does make her cry and difficult sleeping.  She also finds that her legs feel heavy when she lifts them.  Assessment & Plan: Visit Diagnoses:  1. Acute midline low back pain with bilateral sciatica   2. Pain in left leg     Plan: Acute back pain without any improvement despite 2 rounds of prednisone as well as Robaxin and Toradol shots.  She seems very acute and has very uncomfortable.  Will order an MRI.  In the meantime I have given her a few tramadol to take.  Also can try taking Mobic instead of ibuprofen.  Knows not to take this on an empty stomach.  Would like to try to get her into PT and an MRI  Follow-Up Instructions: No follow-ups on file.   Ortho Exam  Patient is alert, oriented, no adenopathy, well-dressed, normal affect, normal respiratory effort. Patient sitting appears uncomfortable.  She has pain with moving her back forward and back extension flexion.  She is focally tender to palpation along the lower back.  She has good strength with resisted plantarflexion dorsiflexion extension and flexion of her legs.  Sensation is intact.  She does  have reproduction of her pain in her lower back with straight leg raising  Imaging: XR Lumbar Spine Complete  Result Date: 08/29/2022 4 view radiographs of her lumbar spine demonstrate overall well-preserved joint spacing.  No evidence of any acute fracture.  No significant degenerative changes.  No evidence of listhesis  No images are attached to the encounter.  Labs: Lab Results  Component Value Date   HGBA1C 5.5 03/06/2022   ESRSEDRATE 20 06/29/2021   ESRSEDRATE 47 (H) 11/02/2020   CRP 10 06/29/2021   GRAMSTAIN No WBC Seen 02/20/2013   GRAMSTAIN No Squamous Epithelial Cells Seen 02/20/2013   GRAMSTAIN Rare Gram Positive Cocci In Clusters 02/20/2013   LABORGA METHICILLIN RESISTANT STAPHYLOCOCCUS AUREUS 02/20/2013     Lab Results  Component Value Date   ALBUMIN 4.0 03/06/2022   ALBUMIN 3.4 (L) 01/04/2022   ALBUMIN 4.2 05/18/2021    No results found for: "MG" No results found for: "VD25OH"  No results found for: "PREALBUMIN"    Latest Ref Rng & Units 07/10/2022   11:53 AM 03/06/2022    9:15 AM 01/04/2022    3:50 AM  CBC EXTENDED  WBC 3.8 - 10.8 Thousand/uL 5.1  5.5  8.4   RBC 3.80 - 5.10 Million/uL  4.33  4.39  4.03   Hemoglobin 11.7 - 15.5 g/dL 13.2  12.7  11.6   HCT 35.0 - 45.0 % 38.3  37.5  36.2   Platelets 140 - 400 Thousand/uL 239  284  272   NEUT# 1,500 - 7,800 cells/uL 2,774     Lymph# 850 - 3,900 cells/uL 1,566        There is no height or weight on file to calculate BMI.  Orders:  Orders Placed This Encounter  Procedures   XR Lumbar Spine Complete   MR Lumbar Spine w/o contrast   Ambulatory referral to Physical Therapy   No orders of the defined types were placed in this encounter.    Procedures: No procedures performed  Clinical Data: No additional findings.  ROS:  All other systems negative, except as noted in the HPI. Review of Systems  Objective: Vital Signs: LMP 02/05/2022 (Exact Date)   Specialty Comments:  No specialty comments  available.  PMFS History: Patient Active Problem List   Diagnosis Date Noted   Hemorrhoids 05/15/2022   Encounter for screening fecal occult blood testing 05/15/2022   Weight loss 03/06/2022   Insomnia 09/06/2021   Encounter for gynecological examination with Papanicolaou smear of cervix 02/13/2019   Encounter for surveillance of contraceptive pills 08/12/2017   Fibromyalgia 11/06/2016   Uterine leiomyoma 12/20/2015   Essential hypertension 01/12/2013   Rheumatoid arthritis ([place]) 01/12/2013   Past Medical History:  Diagnosis Date   Anemia    Dysmenorrhea 12/20/2015   Eczema    mild   Fibroids 12/20/2015   Fibromyalgia    GERD (gastroesophageal reflux disease)    Hypertension    Menorrhagia with regular cycle 12/20/2015   PONV (postoperative nausea and vomiting)    Rash    Reflux    Rheumatoid arthritis ([place])    Rhinitis    chonic   Vitamin D deficiency    Vulvar itching 12/20/2015    Family History  Problem Relation Age of Onset   Diabetes Mother    Hypertension Mother    Asthma Mother    Cancer Mother        breast   Thyroid disease Mother    Arthritis Mother    Berenice Primas' disease Mother    Cancer Maternal Grandmother        pancreatic   Fibroids Sister    COPD Maternal Aunt    Asthma Maternal Aunt    Gout Maternal Aunt     Past Surgical History:  Procedure Laterality Date   CHOLECYSTECTOMY N/A 02/08/2022   Procedure: LAPAROSCOPIC CHOLECYSTECTOMY;  Surgeon: Rusty Aus, DO;  Location: [facility];  Service: General;  Laterality: N/A;   ESOPHAGOGASTRODUODENOSCOPY ENDOSCOPY     IR GENERIC HISTORICAL  02/07/2016   IR RADIOLOGIST EVAL & MGMT 02/07/2016 [name], MD [facility] INTER[facility] RAD   WISDOM TOOTH EXTRACTION     Social History   Occupational History   Not on file  Tobacco Use   Smoking status: Never    Passive exposure: Never   Smokeless tobacco: Never  Vaping Use   Vaping Use: Never used  Substance and Sexual Activity   Alcohol use: No   Drug  use: Never   Sexual activity: Not Currently    Birth control/protection: Pill

## 2022-08-29 NOTE — Telephone Encounter (Signed)
Patient would like to do her PT at [facility] in [place] being its closer to her home instead of her coming to [facility] to do it here please advise to referral can be changed

## 2022-08-29 NOTE — Telephone Encounter (Signed)
Referral changed to [facility] PT. I called patient and advised.

## 2022-08-30 ENCOUNTER — Other Ambulatory Visit: Payer: Self-pay

## 2022-08-30 ENCOUNTER — Other Ambulatory Visit (HOSPITAL_COMMUNITY): Payer: Self-pay

## 2022-09-01 ENCOUNTER — Ambulatory Visit (HOSPITAL_COMMUNITY)
Admission: RE | Admit: 2022-09-01 | Discharge: 2022-09-01 | Disposition: A | Discharge status: Home / Self Care | Payer: Commercial Managed Care - PPO | Source: Ambulatory Visit | Attending: Physician Assistant | Admitting: Physician Assistant

## 2022-09-01 DIAGNOSIS — M5441 Lumbago with sciatica, right side: Secondary | ICD-10-CM | POA: Insufficient documentation

## 2022-09-01 DIAGNOSIS — M47816 Spondylosis without myelopathy or radiculopathy, lumbar region: Secondary | ICD-10-CM | POA: Diagnosis not present

## 2022-09-01 DIAGNOSIS — M5442 Lumbago with sciatica, left side: Secondary | ICD-10-CM

## 2022-09-06 ENCOUNTER — Ambulatory Visit: Payer: 59 | Admitting: Family Medicine

## 2022-09-10 ENCOUNTER — Other Ambulatory Visit (HOSPITAL_COMMUNITY): Payer: Self-pay

## 2022-09-10 ENCOUNTER — Other Ambulatory Visit: Payer: Self-pay

## 2022-09-13 ENCOUNTER — Other Ambulatory Visit (HOSPITAL_COMMUNITY): Payer: Self-pay

## 2022-09-18 ENCOUNTER — Ambulatory Visit: Payer: 59 | Admitting: Family Medicine

## 2022-09-21 ENCOUNTER — Telehealth: Payer: Self-pay | Admitting: Physician Assistant

## 2022-09-21 ENCOUNTER — Other Ambulatory Visit (HOSPITAL_COMMUNITY): Payer: Self-pay

## 2022-09-21 NOTE — Telephone Encounter (Signed)
Pt called stating she would like to move forward with steroid injections in her back PA Persons stated to her an last appt. Please send referral to Dr Ernestina Patches team. Pt phone number is [phone].

## 2022-09-24 ENCOUNTER — Other Ambulatory Visit: Payer: Self-pay | Admitting: Physician Assistant

## 2022-09-24 ENCOUNTER — Other Ambulatory Visit: Payer: Self-pay

## 2022-09-24 ENCOUNTER — Other Ambulatory Visit (HOSPITAL_COMMUNITY): Payer: Self-pay

## 2022-09-24 DIAGNOSIS — M5441 Lumbago with sciatica, right side: Secondary | ICD-10-CM

## 2022-09-24 DIAGNOSIS — L738 Other specified follicular disorders: Secondary | ICD-10-CM | POA: Diagnosis not present

## 2022-09-24 DIAGNOSIS — L819 Disorder of pigmentation, unspecified: Secondary | ICD-10-CM | POA: Diagnosis not present

## 2022-09-24 DIAGNOSIS — L700 Acne vulgaris: Secondary | ICD-10-CM | POA: Diagnosis not present

## 2022-09-24 MED ORDER — TRETINOIN 0.025 % EX CREA
TOPICAL_CREAM | CUTANEOUS | 11 refills | Status: DC
  Filled 2022-09-24: qty 20, 30d supply, fill #0

## 2022-09-24 NOTE — Telephone Encounter (Signed)
I called patient and advised. Explained referral process and awaiting insurance authorization. Advised someone will call to schedule once we receive insurance authorization.

## 2022-09-24 NOTE — Telephone Encounter (Signed)
noted 

## 2022-09-26 ENCOUNTER — Encounter: Payer: Self-pay | Admitting: Physical Medicine and Rehabilitation

## 2022-09-26 ENCOUNTER — Ambulatory Visit (INDEPENDENT_AMBULATORY_CARE_PROVIDER_SITE_OTHER): Payer: Commercial Managed Care - PPO | Admitting: Physical Medicine and Rehabilitation

## 2022-09-26 ENCOUNTER — Ambulatory Visit: Payer: Commercial Managed Care - PPO | Admitting: Family Medicine

## 2022-09-26 ENCOUNTER — Other Ambulatory Visit: Payer: Self-pay

## 2022-09-26 ENCOUNTER — Encounter: Payer: Self-pay | Admitting: Rheumatology

## 2022-09-26 VITALS — BP 118/68 | HR 95 | Temp 97.3°F | Ht 65.5 in | Wt 203.0 lb

## 2022-09-26 DIAGNOSIS — M5416 Radiculopathy, lumbar region: Secondary | ICD-10-CM

## 2022-09-26 DIAGNOSIS — Z0000 Encounter for general adult medical examination without abnormal findings: Secondary | ICD-10-CM | POA: Insufficient documentation

## 2022-09-26 DIAGNOSIS — M7918 Myalgia, other site: Secondary | ICD-10-CM

## 2022-09-26 DIAGNOSIS — Z1211 Encounter for screening for malignant neoplasm of colon: Secondary | ICD-10-CM

## 2022-09-26 DIAGNOSIS — M797 Fibromyalgia: Secondary | ICD-10-CM

## 2022-09-26 DIAGNOSIS — I10 Essential (primary) hypertension: Secondary | ICD-10-CM | POA: Diagnosis not present

## 2022-09-26 DIAGNOSIS — G4700 Insomnia, unspecified: Secondary | ICD-10-CM | POA: Diagnosis not present

## 2022-09-26 DIAGNOSIS — M546 Pain in thoracic spine: Secondary | ICD-10-CM | POA: Diagnosis not present

## 2022-09-26 DIAGNOSIS — G8929 Other chronic pain: Secondary | ICD-10-CM | POA: Diagnosis not present

## 2022-09-26 DIAGNOSIS — S39012A Strain of muscle, fascia and tendon of lower back, initial encounter: Secondary | ICD-10-CM | POA: Diagnosis not present

## 2022-09-26 MED ORDER — TRAZODONE HCL 50 MG PO TABS
25.0000 mg | ORAL_TABLET | Freq: Every evening | ORAL | 1 refills | Status: DC | PRN
  Filled 2022-09-26 – 2022-11-20 (×2): qty 30, 60d supply, fill #0

## 2022-09-26 MED ORDER — LOSARTAN POTASSIUM 50 MG PO TABS
50.0000 mg | ORAL_TABLET | Freq: Every day | ORAL | 3 refills | Status: DC
  Filled 2022-09-26: qty 90, fill #0
  Filled 2022-10-08 – 2022-11-09 (×2): qty 90, 90d supply, fill #0
  Filled 2023-02-01: qty 90, 90d supply, fill #1

## 2022-09-26 MED ORDER — AMLODIPINE BESYLATE 5 MG PO TABS
5.0000 mg | ORAL_TABLET | Freq: Every day | ORAL | 3 refills | Status: DC
  Filled 2022-09-26: qty 90, fill #0
  Filled 2022-10-08 – 2022-11-09 (×2): qty 90, 90d supply, fill #0
  Filled 2023-03-18: qty 90, 90d supply, fill #1

## 2022-09-26 MED ORDER — HYDROCHLOROTHIAZIDE 12.5 MG PO TABS
12.5000 mg | ORAL_TABLET | Freq: Every day | ORAL | 3 refills | Status: DC
  Filled 2022-09-26: qty 90, fill #0
  Filled 2022-10-08 – 2022-12-15 (×4): qty 90, 90d supply, fill #0
  Filled 2023-03-18: qty 90, 90d supply, fill #1

## 2022-09-26 NOTE — Progress Notes (Signed)
Subjective:  Patient ID: Karen Burton, female    DOB: June 07, 1976  Age: 47 y.o. MRN: [id number]  CC: Chief Complaint  Patient presents with   Hypertension    HPI:  47 year old female with hypertension, rheumatoid arthritis, fibromyalgia, insomnia presents for follow-up.  Patient reports that she has been struggling with low back pain.  She has an upcoming visit with orthopedic surgery.    Hypertension is stable on amlodipine, HCTZ, and losartan.  Needs refill.  He said he is stable with Ambien and as needed trazodone.  Patient Active Problem List   Diagnosis Date Noted   Healthcare maintenance 09/26/2022   Insomnia 09/06/2021   Encounter for surveillance of contraceptive pills 08/12/2017   Fibromyalgia 11/06/2016   Uterine leiomyoma 12/20/2015   Essential hypertension 01/12/2013   Rheumatoid arthritis ([place]) 01/12/2013    Social Hx   Social History   Socioeconomic History   Marital status: Single    Spouse name: Not on file   Number of children: 0   Years of education: Not on file   Highest education level: Not on file  Occupational History   Not on file  Tobacco Use   Smoking status: Never    Passive exposure: Never   Smokeless tobacco: Never  Vaping Use   Vaping Use: Never used  Substance and Sexual Activity   Alcohol use: No   Drug use: Never   Sexual activity: Not Currently    Birth control/protection: Pill  Other Topics Concern   Not on file  Social History Narrative   Not on file   Social Determinants of Health   Financial Resource Strain: Low Risk  (05/15/2022)   Overall Financial Resource Strain (CARDIA)    Difficulty of Paying Living Expenses: Not hard at all  Food Insecurity: No Food Insecurity (05/15/2022)   Hunger Vital Sign    Worried About Running Out of Food in the Last Year: Never true    Ran Out of Food in the Last Year: Never true  Transportation Needs: No Transportation Needs (05/15/2022)   PRAPARE - Armed forces logistics/support/administrative officer (Medical): No    Lack of Transportation (Non-Medical): No  Physical Activity: Insufficiently Active (05/15/2022)   Exercise Vital Sign    Days of Exercise per Week: 3 days    Minutes of Exercise per Session: 30 min  Stress: Stress Concern Present (05/15/2022)   [place]    Feeling of Stress : To some extent  Social Connections: Moderately Isolated (05/15/2022)   Social Connection and Isolation Panel [NHANES]    Frequency of Communication with Friends and Family: More than three times a week    Frequency of Social Gatherings with Friends and Family: More than three times a week    Attends Religious Services: More than 4 times per year    Active Member of [employer] or Organizations: No    Attends Archivist Meetings: Never    Marital Status: Never married    Review of Systems  Musculoskeletal:  Positive for back pain.   Objective:  BP 118/68   Pulse 95   Temp (!) 97.3 F (36.3 C)   Ht 5' 5.5" (1.664 m)   Wt 203 lb (92.1 kg)   SpO2 98%   BMI 33.27 kg/m      09/26/2022    9:30 AM 08/12/2022   10:31 AM 08/01/2022    9:22 AM  BP/Weight  Systolic BP [id number]  [id number]  Diastolic BP 68 85 94  Wt. (Lbs) 203    BMI 33.27 kg/m2      Physical Exam Vitals and nursing note reviewed.  Constitutional:      General: She is not in acute distress.    Appearance: Normal appearance.  HENT:     Head: Normocephalic and atraumatic.  Eyes:     General:        Right eye: No discharge.        Left eye: No discharge.     Conjunctiva/sclera: Conjunctivae normal.  Cardiovascular:     Rate and Rhythm: Normal rate and regular rhythm.  Pulmonary:     Effort: Pulmonary effort is normal.     Breath sounds: Normal breath sounds. No wheezing, rhonchi or rales.  Neurological:     Mental Status: She is alert.     Lab Results  Component Value Date   WBC 5.1 07/10/2022   HGB 13.2 07/10/2022   HCT 38.3  07/10/2022   PLT 239 07/10/2022   GLUCOSE 90 07/10/2022   CHOL 139 03/06/2022   TRIG 134 03/06/2022   HDL 50 03/06/2022   LDLCALC 66 03/06/2022   ALT 24 07/10/2022   AST 21 07/10/2022   NA 139 07/10/2022   K 4.1 07/10/2022   CL 104 07/10/2022   CREATININE 0.88 07/10/2022   BUN 14 07/10/2022   CO2 27 07/10/2022   TSH 3.190 03/06/2022   HGBA1C 5.5 03/06/2022     Assessment & Plan:   Problem List Items Addressed This Visit       Cardiovascular and Mediastinum   Essential hypertension - Primary    Stable.  Continue current medications.  Refilled today.      Relevant Medications   amLODipine (NORVASC) 5 MG tablet   hydrochlorothiazide (HYDRODIURIL) 12.5 MG tablet   losartan (COZAAR) 50 MG tablet     Other   Insomnia    Stable.  Trazodone refilled.      Healthcare maintenance    Patient wants to wait on colonoscopy.  Will send Cologuard.  Advised to get mammogram.      Other Visit Diagnoses     Colon cancer screening       Relevant Orders   Cologuard       Meds ordered this encounter  Medications   amLODipine (NORVASC) 5 MG tablet    Sig: TAKE 1 TABLET BY MOUTH DAILY FOR BLOOD PRESSURE    Dispense:  90 tablet    Refill:  3   hydrochlorothiazide (HYDRODIURIL) 12.5 MG tablet    Sig: TAKE 1 TABLET BY MOUTH DAILY.    Dispense:  90 tablet    Refill:  3   losartan (COZAAR) 50 MG tablet    Sig: TAKE 1 TABLET BY MOUTH DAILY.    Dispense:  90 tablet    Refill:  3   traZODone (DESYREL) 50 MG tablet    Sig: Take 1/2 tablet (25 mg total) by mouth at bedtime as needed.    Dispense:  30 tablet    Refill:  1    Follow-up:  Return in about 6 months (around 03/27/2023).  [place]

## 2022-09-26 NOTE — Assessment & Plan Note (Signed)
Stable.  Continue current medications.  Refilled today.

## 2022-09-26 NOTE — Progress Notes (Unsigned)
Functional Pain Scale - descriptive words and definitions  Unmanageable (7)  Pain interferes with normal ADL's/nothing seems to help/sleep is very difficult/active distractions are very difficult to concentrate on. Severe range order  Average Pain 6  Lower back pain. Standing and walking makes pain worse. Sitting can make pain uncomfortable

## 2022-09-26 NOTE — Assessment & Plan Note (Signed)
Patient wants to wait on colonoscopy.  Will send Cologuard.  Advised to get mammogram.

## 2022-09-26 NOTE — Patient Instructions (Signed)
Continue your medications.  Cologuard ordered.   Follow up in 6 months.

## 2022-09-26 NOTE — Progress Notes (Unsigned)
Karen Burton - 47 y.o. female MRN [id number]  Date of birth: August 17, 1975  Office Visit Note: Visit Date: 09/26/2022 PCP: Coral Spikes, DO Referred by: Coral Spikes, DO  Subjective: Chief Complaint  Patient presents with   Lower Back - Pain   HPI: Karen Burton is a 47 y.o. female who comes in today per the request of Bevely Palmer Persons, PA for evaluation of chronic, worsening and severe bilateral lower back pain, intermittent radiation of pain to lateral hips and up her back. Pain started beginning of January after assisting her mother into wheelchair. Pain worsens with activity and movement. She describes pain sharp and stabbing in nature, currently rates as 8 out of 10. Some relief of pain with home exercise regimen, heat/ice, rest and use of medications. She is scheduled to start formal physical therapy on 10/05/2022 at [facility]. Does take Meloxicam and Tramadol intermittently. Recent lumbar MRI imaging exhibits lumbar spondylosis and mild displacement of the right L2 nerve in the lateral extraforaminal space at L2-L3, borderline right foraminal stenosis at L5-S1. No high grade spinal canal stenosis noted. Patient does carry diagnosis of rheumatoid arthritis and fibromyalgia, currently being managed by Dr. Criss Alvine with [facility]. Does take Enbrel, Cymbalta and Robaxin. 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 Patient denies focal weakness, numbness and tingling. No recent trauma or falls.        Oswestry Disability Index Score 16% 0 to 10 (20%)   minimal disability: The patient can cope with most living activities. Usually no treatment is indicated apart from advice on lifting sitting and exercise.  Review of Systems  Musculoskeletal:  Positive for back pain and myalgias.  Neurological:  Negative for tingling, sensory change, focal weakness and weakness.  All other systems reviewed and are  negative.  Otherwise per HPI.  Assessment & Plan: Visit Diagnoses:    ICD-10-CM   1. Strain of lumbar region, initial encounter  S39.012A     2. Lumbar radiculopathy  M54.16     3. Chronic bilateral thoracic back pain  M54.6    G89.29     4. Myofascial pain syndrome  M79.18     5. Fibromyalgia  M79.7        Plan: Findings:  Chronic, worsening and severe bilateral lower back pain, intermittent radiation of pain to lateral hips and up her back. Patient continues to have severe pain despite good conservative therapies such as home exercise regimen, heat/ice, rest and use of medications. Patients clinical presentation and exam are complex, differentials include lumbar strain, lumbar radiculopathy and myofascial pain syndrome. I also believe her fibromyalgia is working to exacerbate her pain. She is tender upon light palpation to bilateral lower back, lateral hips and bilateral thoracic paraspinal regions upon exam today. Her recent lumbar MRI imaging is fairly good for her age and does not directly correlate with her symptoms. I would like for patient to complete several sessions of physical therapy, I would also recommend dry needling if she is able to tolerate. I would like to see her back post PT in 4 weeks for re-evaluation. I also instructed patient to take Meloxicam daily until we see her back in the office, I am happy to refill when needed. If pain persists post PT we can certainly discuss possible lumbar epidural steroid injection. I encouraged patient to remain active as tolerated. No red flag symptoms noted today.     Meds & Orders:  No orders of the defined types were placed in this encounter.  No orders of the defined types were placed in this encounter.   Follow-up: Return for 4 week follow up for re-evaluation.   Procedures: No procedures performed      Clinical History: MRI LUMBAR SPINE WITHOUT CONTRAST   TECHNIQUE: Multiplanar, multisequence MR imaging of the lumbar  spine was performed. No intravenous contrast was administered.   COMPARISON:  Radiographs 08/29/2022   FINDINGS: Segmentation: The lowest lumbar type non-rib-bearing vertebra is labeled as L5. This vertebra has a mildly transitional morphology, with the broad right transverse process pseudo articulating with the sacrum.   Alignment:  No vertebral subluxation is observed.   Vertebrae: No significant vertebral marrow edema is identified. Intervertebral disc height and signal is preserved.   Conus medullaris and cauda equina: Conus extends to the L1 level. Conus and cauda equina appear normal.   Paraspinal and other soft tissues: Large heterogeneous presacral structure believed to represent fibroid uterus, only partially included on today's exam. Uterine fibroids were shown on the prior pelvic ultrasound of 03/09/2019.   Disc levels:   T12-L1: Unremarkable   L1-2: Unremarkable   L2-3: Mild displacement of the right L2 nerve in the lateral extraforaminal space due to right lateral extraforaminal disc protrusion. Mild bilateral degenerative facet arthropathy.   L3-4: No impingement. Mild disc bulge and mild degenerative facet arthropathy.   L4-5: No impingement.  Mild disc bulge.   L5-S1: Borderline right foraminal stenosis due to disc protrusion and mild spurring along the right neural foramen.   IMPRESSION: 1. Lumbar spondylosis and degenerative disc disease, causing mild displacement of the right L2 nerve in the lateral extraforaminal space at L2-3 and borderline right foraminal stenosis at L5-S1. 2. Large heterogeneous presacral structure believed to represent fibroid uterus, only partially included on today's exam.     Electronically Signed   By: Van Clines M.D.   On: 09/01/2022 12:48   She reports that she has never smoked. She has never been exposed to tobacco smoke. She has never used smokeless tobacco.  Recent Labs    03/06/22 0915  HGBA1C 5.5     Objective:  VS:  HT:    WT:   BMI:     BP:   HR: bpm  TEMP: ( )  RESP:  Physical Exam Vitals and nursing note reviewed.  HENT:     Head: Normocephalic.     Right Ear: External ear normal.     Left Ear: External ear normal.     Nose: Nose normal.     Mouth/Throat:     Mouth: Mucous membranes are moist.  Eyes:     Extraocular Movements: Extraocular movements intact.  Cardiovascular:     Rate and Rhythm: Normal rate.     Pulses: Normal pulses.  Pulmonary:     Effort: Pulmonary effort is normal.  Abdominal:     General: Abdomen is flat. There is no distension.  Musculoskeletal:        General: Tenderness present.     Cervical back: Normal range of motion.     Comments: Pt rises from seated position to standing without difficulty. Good lumbar range of motion. Strong distal strength without clonus, no pain upon palpation of greater trochanters. Sensation intact bilaterally. Tenderness noted to bilateral lower back, lateral hips and bilateral thoracic paraspinal region upon palpation today. Walks independently, gait steady.   Skin:    General: Skin is warm and dry.     Capillary  Refill: Capillary refill takes less than 2 seconds.  Neurological:     General: No focal deficit present.     Mental Status: She is alert and oriented to person, place, and time.  Psychiatric:        Mood and Affect: Mood normal.        Behavior: Behavior normal.     Ortho Exam  Imaging: No results found.  Past Medical/Family/Surgical/Social History: Medications & Allergies reviewed per EMR, new medications updated. Patient Active Problem List   Diagnosis Date Noted   Healthcare maintenance 09/26/2022   Insomnia 09/06/2021   Encounter for surveillance of contraceptive pills 08/12/2017   Fibromyalgia 11/06/2016   Uterine leiomyoma 12/20/2015   Essential hypertension 01/12/2013   Rheumatoid arthritis ([place]) 01/12/2013   Past Medical History:  Diagnosis Date   Anemia    Dysmenorrhea  12/20/2015   Eczema    mild   Fibroids 12/20/2015   Fibromyalgia    GERD (gastroesophageal reflux disease)    Hypertension    Menorrhagia with regular cycle 12/20/2015   PONV (postoperative nausea and vomiting)    Rash    Reflux    Rheumatoid arthritis ([facility])    Rhinitis    chonic   Vitamin D deficiency    Vulvar itching 12/20/2015   Family History  Problem Relation Age of Onset   Diabetes Mother    Hypertension Mother    Asthma Mother    Cancer Mother        breast   Thyroid disease Mother    Arthritis Mother    Berenice Primas' disease Mother    Cancer Maternal Grandmother        pancreatic   Fibroids Sister    COPD Maternal Aunt    Asthma Maternal Aunt    Gout Maternal Aunt    Past Surgical History:  Procedure Laterality Date   CHOLECYSTECTOMY N/A 02/08/2022   Procedure: LAPAROSCOPIC CHOLECYSTECTOMY;  Surgeon: Rusty Aus, DO;  Location: [facility];  Service: General;  Laterality: N/A;   ESOPHAGOGASTRODUODENOSCOPY ENDOSCOPY     IR GENERIC HISTORICAL  02/07/2016   IR RADIOLOGIST EVAL & MGMT 02/07/2016 [name], MD [facility] INTERV RAD   WISDOM TOOTH EXTRACTION     Social History   Occupational History   Not on file  Tobacco Use   Smoking status: Never    Passive exposure: Never   Smokeless tobacco: Never  Vaping Use   Vaping Use: Never used  Substance and Sexual Activity   Alcohol use: No   Drug use: Never   Sexual activity: Not Currently    Birth control/protection: Pill

## 2022-09-26 NOTE — Telephone Encounter (Signed)
You may ask your orthopedic surgeon if they can give you a prescription for short-term muscle relaxer.

## 2022-09-26 NOTE — Assessment & Plan Note (Signed)
Stable.  Trazodone refilled.

## 2022-09-27 ENCOUNTER — Other Ambulatory Visit: Payer: Self-pay

## 2022-09-27 ENCOUNTER — Other Ambulatory Visit (HOSPITAL_COMMUNITY): Payer: Self-pay

## 2022-09-27 MED ORDER — METHOCARBAMOL 500 MG PO TABS
500.0000 mg | ORAL_TABLET | Freq: Two times a day (BID) | ORAL | 0 refills | Status: DC | PRN
  Filled 2022-09-27: qty 30, 15d supply, fill #0

## 2022-10-02 ENCOUNTER — Other Ambulatory Visit (HOSPITAL_COMMUNITY): Payer: Self-pay
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
## 2022-10-04 ENCOUNTER — Other Ambulatory Visit (HOSPITAL_COMMUNITY): Payer: Self-pay

## 2022-10-05 ENCOUNTER — Ambulatory Visit (HOSPITAL_COMMUNITY): Payer: Commercial Managed Care - PPO | Attending: Physician Assistant

## 2022-10-05 ENCOUNTER — Other Ambulatory Visit: Payer: Self-pay

## 2022-10-05 DIAGNOSIS — M5441 Lumbago with sciatica, right side: Secondary | ICD-10-CM | POA: Diagnosis not present

## 2022-10-05 DIAGNOSIS — M545 Low back pain, unspecified: Secondary | ICD-10-CM | POA: Diagnosis not present

## 2022-10-05 DIAGNOSIS — G8929 Other chronic pain: Secondary | ICD-10-CM

## 2022-10-05 DIAGNOSIS — M5442 Lumbago with sciatica, left side: Secondary | ICD-10-CM | POA: Insufficient documentation

## 2022-10-08 ENCOUNTER — Other Ambulatory Visit: Payer: Self-pay

## 2022-10-08 ENCOUNTER — Other Ambulatory Visit (HOSPITAL_COMMUNITY): Payer: Self-pay

## 2022-10-09 ENCOUNTER — Other Ambulatory Visit: Payer: Self-pay | Admitting: Physical Medicine and Rehabilitation

## 2022-10-09 ENCOUNTER — Other Ambulatory Visit: Payer: Self-pay

## 2022-10-09 ENCOUNTER — Ambulatory Visit (HOSPITAL_COMMUNITY): Payer: Commercial Managed Care - PPO

## 2022-10-09 ENCOUNTER — Other Ambulatory Visit (HOSPITAL_COMMUNITY): Payer: Self-pay

## 2022-10-09 DIAGNOSIS — M545 Low back pain, unspecified: Secondary | ICD-10-CM

## 2022-10-09 DIAGNOSIS — M5442 Lumbago with sciatica, left side: Secondary | ICD-10-CM | POA: Diagnosis not present

## 2022-10-09 DIAGNOSIS — G8929 Other chronic pain: Secondary | ICD-10-CM | POA: Diagnosis not present

## 2022-10-09 DIAGNOSIS — M5441 Lumbago with sciatica, right side: Secondary | ICD-10-CM | POA: Diagnosis not present

## 2022-10-09 MED ORDER — MELOXICAM 15 MG PO TABS
15.0000 mg | ORAL_TABLET | Freq: Every day | ORAL | 0 refills | Status: DC
  Filled 2022-10-09: qty 30, 30d supply, fill #0
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
## 2022-10-11 ENCOUNTER — Ambulatory Visit (HOSPITAL_COMMUNITY): Payer: Commercial Managed Care - PPO

## 2022-10-11 DIAGNOSIS — M5441 Lumbago with sciatica, right side: Secondary | ICD-10-CM | POA: Diagnosis not present

## 2022-10-11 DIAGNOSIS — G8929 Other chronic pain: Secondary | ICD-10-CM | POA: Diagnosis not present

## 2022-10-11 DIAGNOSIS — M5442 Lumbago with sciatica, left side: Secondary | ICD-10-CM | POA: Diagnosis not present

## 2022-10-11 DIAGNOSIS — M545 Low back pain, unspecified: Secondary | ICD-10-CM | POA: Diagnosis not present
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
## 2022-10-12 ENCOUNTER — Other Ambulatory Visit: Payer: Self-pay

## 2022-10-16 ENCOUNTER — Ambulatory Visit (HOSPITAL_COMMUNITY): Payer: Commercial Managed Care - PPO | Admitting: Physical Therapy

## 2022-10-16 DIAGNOSIS — G8929 Other chronic pain: Secondary | ICD-10-CM

## 2022-10-16 DIAGNOSIS — M545 Low back pain, unspecified: Secondary | ICD-10-CM

## 2022-10-16 DIAGNOSIS — M5442 Lumbago with sciatica, left side: Secondary | ICD-10-CM | POA: Diagnosis not present

## 2022-10-16 DIAGNOSIS — M5441 Lumbago with sciatica, right side: Secondary | ICD-10-CM | POA: Diagnosis not present
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
## 2022-10-23 ENCOUNTER — Ambulatory Visit (HOSPITAL_COMMUNITY): Payer: Commercial Managed Care - PPO

## 2022-10-23 DIAGNOSIS — M545 Low back pain, unspecified: Secondary | ICD-10-CM

## 2022-10-23 DIAGNOSIS — M5442 Lumbago with sciatica, left side: Secondary | ICD-10-CM | POA: Diagnosis not present

## 2022-10-23 DIAGNOSIS — G8929 Other chronic pain: Secondary | ICD-10-CM

## 2022-10-23 DIAGNOSIS — M5441 Lumbago with sciatica, right side: Secondary | ICD-10-CM | POA: Diagnosis not present
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
## 2022-10-26 ENCOUNTER — Other Ambulatory Visit: Payer: Self-pay | Admitting: Physician Assistant

## 2022-10-26 ENCOUNTER — Ambulatory Visit (HOSPITAL_COMMUNITY): Payer: Commercial Managed Care - PPO

## 2022-10-26 DIAGNOSIS — M545 Low back pain, unspecified: Secondary | ICD-10-CM

## 2022-10-26 DIAGNOSIS — M5442 Lumbago with sciatica, left side: Secondary | ICD-10-CM | POA: Diagnosis not present

## 2022-10-26 DIAGNOSIS — M5441 Lumbago with sciatica, right side: Secondary | ICD-10-CM | POA: Diagnosis not present

## 2022-10-26 DIAGNOSIS — G8929 Other chronic pain: Secondary | ICD-10-CM | POA: Diagnosis not present
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
## 2022-10-28 DIAGNOSIS — Z1211 Encounter for screening for malignant neoplasm of colon: Secondary | ICD-10-CM | POA: Diagnosis not present

## 2022-10-29 ENCOUNTER — Other Ambulatory Visit: Payer: Self-pay

## 2022-10-29 MED ORDER — DULOXETINE HCL 60 MG PO CPEP
60.0000 mg | ORAL_CAPSULE | Freq: Every day | ORAL | 2 refills | Status: DC
  Filled 2022-10-29: qty 30, 30d supply, fill #0
  Filled 2022-11-20 – 2022-11-27 (×2): qty 30, 30d supply, fill #1
  Filled 2022-12-24: qty 30, 30d supply, fill #2

## 2022-10-29 NOTE — Telephone Encounter (Signed)
Last Fill: 07/17/2022  Next Visit: 01/11/2023  Last Visit: 08/01/2022  Dx: Fibromyalgia   Current Dose per office note on 08/01/2022: not discussed  Okay to refill Cymbalta?

## 2022-10-31 ENCOUNTER — Ambulatory Visit (HOSPITAL_COMMUNITY): Payer: Commercial Managed Care - PPO | Attending: Plastic Surgery

## 2022-10-31 DIAGNOSIS — M5442 Lumbago with sciatica, left side: Secondary | ICD-10-CM | POA: Insufficient documentation

## 2022-10-31 DIAGNOSIS — G8929 Other chronic pain: Secondary | ICD-10-CM | POA: Insufficient documentation

## 2022-10-31 DIAGNOSIS — M5441 Lumbago with sciatica, right side: Secondary | ICD-10-CM | POA: Insufficient documentation

## 2022-10-31 DIAGNOSIS — M545 Low back pain, unspecified: Secondary | ICD-10-CM
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
## 2022-11-02 LAB — COLOGUARD: COLOGUARD: NEGATIVE

## 2022-11-05 ENCOUNTER — Telehealth: Payer: Self-pay

## 2022-11-05 NOTE — Telephone Encounter (Signed)
Patient called in and wanted to know if she should go ahead and get dry needling or if she should come in to be seen first

## 2022-11-05 NOTE — Telephone Encounter (Signed)
Informed patient to complete dry needling and call us when she is done with appointments. Verbalized understanding

## 2022-11-08 ENCOUNTER — Other Ambulatory Visit (HOSPITAL_COMMUNITY): Payer: Self-pay

## 2022-11-08 ENCOUNTER — Other Ambulatory Visit: Payer: Self-pay | Admitting: Physician Assistant

## 2022-11-08 ENCOUNTER — Other Ambulatory Visit: Payer: Self-pay

## 2022-11-08 ENCOUNTER — Encounter: Payer: Self-pay | Admitting: *Deleted

## 2022-11-08 DIAGNOSIS — M0579 Rheumatoid arthritis with rheumatoid factor of multiple sites without organ or systems involvement: Secondary | ICD-10-CM

## 2022-11-08 MED ORDER — ENBREL MINI 50 MG/ML ~~LOC~~ SOCT
50.0000 mg | SUBCUTANEOUS | 0 refills | Status: DC
  Filled 2022-11-08 – 2022-11-09 (×2): qty 4, 28d supply, fill #0

## 2022-11-08 NOTE — Telephone Encounter (Signed)
Last Fill: 07/25/2022  Labs: 07/10/2022 CBC and CMP WNL   TB Gold: 05/07/2022 Neg   Next Visit: 01/11/2023  Last Visit: 08/01/2022  DX: Rheumatoid arthritis involving multiple sites with positive rheumatoid factor   Current Dose per office note 08/01/2022: Enbrel 50 mg sq injection once weekly.    Message sent to patient to advise she is due to update labs.   Okay to refill Enbrel?

## 2022-11-09 ENCOUNTER — Other Ambulatory Visit: Payer: Self-pay

## 2022-11-09 ENCOUNTER — Other Ambulatory Visit: Payer: Self-pay | Admitting: Physician Assistant

## 2022-11-09 ENCOUNTER — Other Ambulatory Visit: Payer: Self-pay | Admitting: Family Medicine

## 2022-11-09 ENCOUNTER — Other Ambulatory Visit (HOSPITAL_COMMUNITY): Payer: Self-pay

## 2022-11-09 MED ORDER — METHOCARBAMOL 500 MG PO TABS
500.0000 mg | ORAL_TABLET | Freq: Two times a day (BID) | ORAL | 0 refills | Status: DC | PRN
  Filled 2022-11-09: qty 30, 15d supply, fill #0

## 2022-11-09 NOTE — Telephone Encounter (Signed)
Last Fill:  Next Visit: 01/11/2023   Last Visit: 08/01/2022   DX: Fibromyalgia   Current Dose per office note 08/01/2022: not discussed  Okay to refill Methocarbamol?

## 2022-11-12 ENCOUNTER — Other Ambulatory Visit: Payer: Self-pay

## 2022-11-14 ENCOUNTER — Ambulatory Visit (HOSPITAL_COMMUNITY): Payer: Commercial Managed Care - PPO | Admitting: Physical Therapy

## 2022-11-14 ENCOUNTER — Other Ambulatory Visit: Payer: Self-pay | Admitting: *Deleted

## 2022-11-14 ENCOUNTER — Encounter (HOSPITAL_COMMUNITY): Payer: Self-pay | Admitting: Physical Therapy

## 2022-11-14 ENCOUNTER — Encounter: Payer: Self-pay | Admitting: *Deleted

## 2022-11-14 DIAGNOSIS — M545 Low back pain, unspecified: Secondary | ICD-10-CM | POA: Diagnosis not present

## 2022-11-14 DIAGNOSIS — G8929 Other chronic pain: Secondary | ICD-10-CM

## 2022-11-14 DIAGNOSIS — M5441 Lumbago with sciatica, right side: Secondary | ICD-10-CM | POA: Diagnosis not present

## 2022-11-14 DIAGNOSIS — M5442 Lumbago with sciatica, left side: Secondary | ICD-10-CM | POA: Diagnosis not present

## 2022-11-14 DIAGNOSIS — Z79899 Other long term (current) drug therapy: Secondary | ICD-10-CM

## 2022-11-14 NOTE — Patient Instructions (Signed)
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
## 2022-11-16 MED ORDER — MELOXICAM 15 MG PO TABS
15.0000 mg | ORAL_TABLET | Freq: Every day | ORAL | 0 refills | Status: DC
  Filled 2022-11-16: qty 30, 30d supply, fill #0

## 2022-11-17 ENCOUNTER — Other Ambulatory Visit (HOSPITAL_COMMUNITY): Payer: Self-pay

## 2022-11-20 ENCOUNTER — Other Ambulatory Visit (HOSPITAL_COMMUNITY): Payer: Self-pay

## 2022-11-20 ENCOUNTER — Other Ambulatory Visit: Payer: Self-pay

## 2022-11-23 DIAGNOSIS — Z79899 Other long term (current) drug therapy: Secondary | ICD-10-CM | POA: Diagnosis not present

## 2022-11-23 LAB — CBC WITH DIFFERENTIAL/PLATELET
Basophils Relative: 0.5 %
Eosinophils Relative: 2.6 %
MCHC: 33.7 g/dL (ref 32.0–36.0)
Monocytes Relative: 12.1 %
Platelets: 236 10*3/uL (ref 140–400)
RBC: 4.24 10*6/uL (ref 3.80–5.10)
RDW: 12.1 % (ref 11.0–15.0)

## 2022-11-24 LAB — CBC WITH DIFFERENTIAL/PLATELET
Absolute Monocytes: 508 cells/uL (ref 200–950)
Basophils Absolute: 21 cells/uL (ref 0–200)
Eosinophils Absolute: 109 cells/uL (ref 15–500)
HCT: 38.3 % (ref 35.0–45.0)
Hemoglobin: 12.9 g/dL (ref 11.7–15.5)
Lymphs Abs: 1579 cells/uL (ref 850–3900)
MCH: 30.4 pg (ref 27.0–33.0)
MCV: 90.3 fL (ref 80.0–100.0)
MPV: 10.2 fL (ref 7.5–12.5)
Neutro Abs: 1982 cells/uL (ref 1500–7800)
Neutrophils Relative %: 47.2 %
Total Lymphocyte: 37.6 %
WBC: 4.2 10*3/uL (ref 3.8–10.8)

## 2022-11-24 LAB — COMPLETE METABOLIC PANEL WITH GFR
AG Ratio: 1.3 (calc) (ref 1.0–2.5)
ALT: 25 U/L (ref 6–29)
AST: 20 U/L (ref 10–35)
Albumin: 3.6 g/dL (ref 3.6–5.1)
Alkaline phosphatase (APISO): 69 U/L (ref 31–125)
BUN: 11 mg/dL (ref 7–25)
CO2: 28 mmol/L (ref 20–32)
Calcium: 8.8 mg/dL (ref 8.6–10.2)
Chloride: 105 mmol/L (ref 98–110)
Creat: 0.78 mg/dL (ref 0.50–0.99)
Globulin: 2.8 g/dL (calc) (ref 1.9–3.7)
Glucose, Bld: 95 mg/dL (ref 65–99)
Potassium: 4.2 mmol/L (ref 3.5–5.3)
Sodium: 139 mmol/L (ref 135–146)
Total Bilirubin: 0.2 mg/dL (ref 0.2–1.2)
Total Protein: 6.4 g/dL (ref 6.1–8.1)
eGFR: 94 mL/min/{1.73_m2} (ref >=60)

## 2022-11-26 NOTE — Progress Notes (Signed)
CBC and CMP WNL

## 2022-11-27 ENCOUNTER — Other Ambulatory Visit: Payer: Self-pay

## 2022-11-28 ENCOUNTER — Other Ambulatory Visit: Payer: Self-pay

## 2022-11-29 ENCOUNTER — Encounter: Payer: Self-pay | Admitting: Physical Medicine and Rehabilitation

## 2022-11-29 ENCOUNTER — Ambulatory Visit (INDEPENDENT_AMBULATORY_CARE_PROVIDER_SITE_OTHER): Payer: Commercial Managed Care - PPO | Admitting: Physical Medicine and Rehabilitation

## 2022-11-29 DIAGNOSIS — M7918 Myalgia, other site: Secondary | ICD-10-CM

## 2022-11-29 DIAGNOSIS — M546 Pain in thoracic spine: Secondary | ICD-10-CM

## 2022-11-29 DIAGNOSIS — M797 Fibromyalgia: Secondary | ICD-10-CM | POA: Diagnosis not present

## 2022-11-29 DIAGNOSIS — M5441 Lumbago with sciatica, right side: Secondary | ICD-10-CM

## 2022-11-29 DIAGNOSIS — M5442 Lumbago with sciatica, left side: Secondary | ICD-10-CM | POA: Diagnosis not present

## 2022-11-29 DIAGNOSIS — G8929 Other chronic pain: Secondary | ICD-10-CM | POA: Diagnosis not present

## 2022-11-29 NOTE — Progress Notes (Signed)
Functional Pain Scale - descriptive words and definitions  Distressing (6)    Pain is present/unable to complete most ADLs limited by pain/sleep is difficult and active distraction is only marginal. Moderate range order  Average Pain  varies, but can be 7-8  Lower back pain. Pain is no better

## 2022-11-29 NOTE — Progress Notes (Signed)
Karen Burton - 47 y.o. female MRN [id number]  Date of birth: April 02, 1976  Office Visit Note: Visit Date: 11/29/2022 PCP: Karen Sams, DO Referred by: Karen Sams, DO  Subjective: Chief Complaint  Patient presents with   Lower Back - Pain   HPI: Karen Burton is a 47 y.o. female who comes in today for evaluation of chronic, worsening and severe bilateral lower back pain radiating to buttocks and hips, intermittent radiation of pain up to thoracic back. Severe pain seems to be localized to bilateral quadratus lumborum region of lower back. Pain worsens with activity, movement and bending. States her pain becomes so severe she has to pick her legs up to be able to move. Also reports swelling to entire back. She describes pain as aching and sharp sensation, currently rates as 8 out of 10. Some relief of pain with home exercise regimen, rest and use of medications. Does take Meloxicam as needed. Recently completed regimen of formal physical therapy, no relief of pain with these treatments. Patient feels no interventions/treatments have helped to alleviate her pain. Recent lumbar MRI imaging exhibits lumbar spondylosis and mild displacement of the right L2 nerve in the lateral extraforaminal space at L2-L3, borderline right foraminal stenosis at L5-S1. No high grade spinal canal stenosis noted. Patient does carry diagnosis of rheumatoid arthritis and fibromyalgia, currently being managed by Dr. Huey [facility] with [facility]. 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 Patient denies focal weakness, numbness and tingling. No recent trauma or falls.         Review of Systems  Musculoskeletal:  Positive for back pain and myalgias.  Neurological:  Negative for tingling, sensory change, focal weakness and weakness.  All other systems reviewed and are negative.  Otherwise per HPI.  Assessment & Plan: Visit Diagnoses:     ICD-10-CM   1. Chronic bilateral low back pain with bilateral sciatica  M54.42 Bilateral Quadratus Lumborum Muscle Tendon Injection   M54.41    G89.29     2. Chronic bilateral thoracic back pain  M54.6    G89.29     3. Myofascial pain syndrome  M79.18 Bilateral Quadratus Lumborum Muscle Tendon Injection    4. Fibromyalgia  M79.7        Plan: Findings:  Chronic, worsening and severe bilateral lower back pain radiating to buttocks and hips, intermittent radiation of pain up to thoracic back. Patient continues to have severe pain despite good conservative therapies such as formal physical therapy, home exercise regimen, rest and use of medications. Patients clinical presentation and exam are consistent with myofascial pain syndrome. I also feel her fibromyalgia is contributing to her pain. Diffuse tenderness noted upon palpation of bilateral thoracic and lumbar paraspinal regions. Her symptoms do not correlate with recent lumbar MRI imaging. Next step is to perform muscle tendon injection to bilateral quadratus lumborum region. If good relief with this injection we can perform infrequently as needed. If her pain persists would recommend follow up with rheumatology for re-evaluation, could benefit from comprehensive pain management approach. Would consider referral to [facility]. From a spine standpoint I am not sure we have much more to offer in terms of treatment. No red flag symptoms noted upon exam today.     Meds & Orders: No orders of the defined types were placed in this encounter.   Orders Placed This Encounter  Procedures   Bilateral Quadratus Lumborum Muscle Tendon Injection  Follow-up: Return if symptoms worsen or fail to improve.   Procedures: Bilateral Quadratus Lumborum Muscle Tendon Injection  Date/Time: 11/29/2022 9:16 AM  Performed by: Juanda Chance, NP Authorized by: Juanda Chance, NP   Consent Given by:  Patient Indications:   Pain Total # of Trigger Points:  2 Location: back   Needle Size:  25 G Approach:  Dorsal Medications #1:  5 mL lidocaine 1 % Medications #2:  40 mg triamcinolone acetonide 40 MG/ML Patient tolerance:  Patient tolerated the procedure well with no immediate complications Comments: Muscle tendon injection performed to bilateral quadratus lumborum.        Clinical History: MRI LUMBAR SPINE WITHOUT CONTRAST   TECHNIQUE: Multiplanar, multisequence MR imaging of the lumbar spine was performed. No intravenous contrast was administered.   COMPARISON:  Radiographs 08/29/2022   FINDINGS: Segmentation: The lowest lumbar type non-rib-bearing vertebra is labeled as L5. This vertebra has a mildly transitional morphology, with the broad right transverse process pseudo articulating with the sacrum.   Alignment:  No vertebral subluxation is observed.   Vertebrae: No significant vertebral marrow edema is identified. Intervertebral disc height and signal is preserved.   Conus medullaris and cauda equina: Conus extends to the L1 level. Conus and cauda equina appear normal.   Paraspinal and other soft tissues: Large heterogeneous presacral structure believed to represent fibroid uterus, only partially included on today's exam. Uterine fibroids were shown on the prior pelvic ultrasound of 03/09/2019.   Disc levels:   T12-L1: Unremarkable   L1-2: Unremarkable   L2-3: Mild displacement of the right L2 nerve in the lateral extraforaminal space due to right lateral extraforaminal disc protrusion. Mild bilateral degenerative facet arthropathy.   L3-4: No impingement. Mild disc bulge and mild degenerative facet arthropathy.   L4-5: No impingement.  Mild disc bulge.   L5-S1: Borderline right foraminal stenosis due to disc protrusion and mild spurring along the right neural foramen.   IMPRESSION: 1. Lumbar spondylosis and degenerative disc disease, causing mild displacement of the right  L2 nerve in the lateral extraforaminal space at L2-3 and borderline right foraminal stenosis at L5-S1. 2. Large heterogeneous presacral structure believed to represent fibroid uterus, only partially included on today's exam.     Electronically Signed   By: Gaylyn Rong M.D.   On: 09/01/2022 12:48   She reports that she has never smoked. She has never been exposed to tobacco smoke. She has never used smokeless tobacco.  Recent Labs    03/06/22 0915  HGBA1C 5.5    Objective:  VS:  HT:    WT:   BMI:     BP:   HR: bpm  TEMP: ( )  RESP:  Physical Exam Vitals and nursing note reviewed.  HENT:     Head: Normocephalic and atraumatic.     Right Ear: External ear normal.     Left Ear: External ear normal.     Nose: Nose normal.     Mouth/Throat:     Mouth: Mucous membranes are moist.  Eyes:     Extraocular Movements: Extraocular movements intact.  Cardiovascular:     Rate and Rhythm: Normal rate.     Pulses: Normal pulses.  Pulmonary:     Effort: Pulmonary effort is normal.  Abdominal:     General: Abdomen is flat. There is no distension.  Musculoskeletal:        General: Tenderness present.     Cervical back: Normal range of motion.  Comments: Patient rises from seated position to standing without difficulty. Good lumbar range of motion. No pain noted with facet loading. 5/5 strength noted with bilateral hip flexion, knee flexion/extension, ankle dorsiflexion/plantarflexion and EHL. No clonus noted bilaterally. No pain upon palpation of greater trochanters. No pain with internal/external rotation of bilateral hips. Sensation intact bilaterally. Diffuse tenderness noted to bilateral thoracic and lumbar paraspinal regions. Negative slump test bilaterally. Ambulates without aid, gait steady.     Skin:    General: Skin is warm and dry.     Capillary Refill: Capillary refill takes less than 2 seconds.  Neurological:     General: No focal deficit present.     Mental  Status: She is alert and oriented to person, place, and time.  Psychiatric:        Mood and Affect: Mood normal.        Behavior: Behavior normal.     Ortho Exam  Imaging: No results found.  Past Medical/Family/Surgical/Social History: Medications & Allergies reviewed per EMR, new medications updated. Patient Active Problem List   Diagnosis Date Noted   Healthcare maintenance 09/26/2022   Insomnia 09/06/2021   Encounter for surveillance of contraceptive pills 08/12/2017   Fibromyalgia 11/06/2016   Uterine leiomyoma 12/20/2015   Essential hypertension 01/12/2013   Rheumatoid arthritis (HCC) 01/12/2013   Past Medical History:  Diagnosis Date   Anemia    Dysmenorrhea 12/20/2015   Eczema    mild   Fibroids 12/20/2015   Fibromyalgia    GERD (gastroesophageal reflux disease)    Hypertension    Menorrhagia with regular cycle 12/20/2015   PONV (postoperative nausea and vomiting)    Rash    Reflux    Rheumatoid arthritis (HCC)    Rhinitis    chonic   Vitamin D deficiency    Vulvar itching 12/20/2015   Family History  Problem Relation Age of Onset   Diabetes Mother    Hypertension Mother    Asthma Mother    Cancer Mother        breast   Thyroid disease Mother    Arthritis Mother    Luiz Blare' disease Mother    Cancer Maternal Grandmother        pancreatic   Fibroids Sister    COPD Maternal Aunt    Asthma Maternal Aunt    Gout Maternal Aunt    Past Surgical History:  Procedure Laterality Date   CHOLECYSTECTOMY N/A 02/08/2022   Procedure: LAPAROSCOPIC CHOLECYSTECTOMY;  Surgeon: Lewie Chamber, DO;  Location: [facility];  Service: General;  Laterality: N/A;   ESOPHAGOGASTRODUODENOSCOPY ENDOSCOPY     IR GENERIC HISTORICAL  02/07/2016   IR RADIOLOGIST EVAL & MGMT 02/07/2016 Simonne Come, MD [facility] INTERV RAD   WISDOM TOOTH EXTRACTION     Social History   Occupational History   Not on file  Tobacco Use   Smoking status: Never    Passive exposure: Never    Smokeless tobacco: Never  Vaping Use   Vaping Use: Never used  Substance and Sexual Activity   Alcohol use: No   Drug use: Never   Sexual activity: Not Currently    Birth control/protection: Pill

## 2022-12-04 ENCOUNTER — Other Ambulatory Visit (HOSPITAL_COMMUNITY): Payer: Self-pay

## 2022-12-04 ENCOUNTER — Other Ambulatory Visit: Payer: Self-pay

## 2022-12-04 ENCOUNTER — Other Ambulatory Visit: Payer: Self-pay | Admitting: Physician Assistant

## 2022-12-04 DIAGNOSIS — M0579 Rheumatoid arthritis with rheumatoid factor of multiple sites without organ or systems involvement: Secondary | ICD-10-CM

## 2022-12-04 MED ORDER — ENBREL MINI 50 MG/ML ~~LOC~~ SOCT
50.0000 mg | SUBCUTANEOUS | 0 refills | Status: DC
Start: 2022-12-04 — End: 2023-03-20
  Filled 2022-12-04: qty 4, 28d supply, fill #0
  Filled 2023-01-11: qty 4, 28d supply, fill #1
  Filled 2023-02-13: qty 4, 28d supply, fill #2

## 2022-12-04 NOTE — Telephone Encounter (Signed)
Last Fill: 11/08/2022 (30 day supply)  Labs: 11/23/2022 CBC and CMP WNL   TB Gold: 05/07/2022 Neg    Next Visit: 01/11/2023  Last Visit: 08/01/2022  DX: Rheumatoid arthritis involving multiple sites with positive rheumatoid factor   Current Dose per office note 08/01/2022: Enbrel 50 mg sq injection once weekly.    Okay to refill Enbrel?

## 2022-12-10 ENCOUNTER — Other Ambulatory Visit: Payer: Self-pay

## 2022-12-10 ENCOUNTER — Other Ambulatory Visit (HOSPITAL_COMMUNITY): Payer: Self-pay

## 2022-12-11 MED ORDER — LIDOCAINE HCL 1 % IJ SOLN
5.0000 mL | INTRAMUSCULAR | Status: AC | PRN
Start: 2022-11-29 — End: 2022-11-29
  Administered 2022-11-29: 5 mL

## 2022-12-11 MED ORDER — TRIAMCINOLONE ACETONIDE 40 MG/ML IJ SUSP
40.0000 mg | INTRAMUSCULAR | Status: AC | PRN
Start: 2022-11-29 — End: 2022-11-29
  Administered 2022-11-29: 40 mg via INTRAMUSCULAR

## 2022-12-15 ENCOUNTER — Other Ambulatory Visit (HOSPITAL_COMMUNITY): Payer: Self-pay

## 2022-12-25 ENCOUNTER — Other Ambulatory Visit: Payer: Self-pay

## 2022-12-28 NOTE — Progress Notes (Signed)
Office Visit Note  Patient: Karen Burton             Date of Birth: [birth year]           MRN: [id number]             PCP: Tommie Sams, DO Referring: Tommie Sams, DO Visit Date: 01/11/2023 Occupation: @GUAROCC @  Subjective:  Lower back pain and generalized pain   History of Present Illness: Karen Burton is a 47 y.o. female with seropositive rheumatoid arthritis, degenerative disc disease and fibromyalgia syndrome.  She states that she continues to have severe pain and discomfort in her lower back.  She was evaluated at [facility] and had MRI of her lumbar spine.  She was referred to physical therapy.  She has been going to physical therapy and also had dry needling without much relief.  She is in constant discomfort.  She also has been experiencing a flare of fibromyalgia with generalized pain and discomfort.  She has been taking Cymbalta, tramadol and meloxicam.  She also takes methocarbamol.  She has not noticed any improvement in her symptoms.  She denies any joint swelling.  She states her rheumatoid arthritis is well-controlled.  She has been taking Enbrel 50 mg subcu weekly without any interruption.  She has been under a lot of stress due to her medical condition.    Activities of Daily Living:  Patient reports morning stiffness for 30 minutes.   Patient Reports nocturnal pain.  Difficulty dressing/grooming: Denies Difficulty climbing stairs: Denies Difficulty getting out of chair: Denies Difficulty using hands for taps, buttons, cutlery, and/or writing: Denies  Review of Systems  Constitutional:  Positive for fatigue.  HENT:  Positive for nose dryness. Negative for mouth sores and mouth dryness.   Eyes:  Positive for dryness.  Respiratory:  Negative for shortness of breath.   Cardiovascular:  Negative for chest pain and palpitations.  Gastrointestinal:  Positive for constipation, diarrhea and nausea. Negative for blood in stool.  Endocrine: Negative  for increased urination.  Genitourinary:  Negative for involuntary urination.  Musculoskeletal:  Positive for joint pain, gait problem, joint pain, joint swelling, myalgias, muscle weakness, morning stiffness, muscle tenderness and myalgias.  Skin:  Positive for hair loss. Negative for color change, rash and sensitivity to sunlight.  Allergic/Immunologic: Positive for susceptible to infections.  Neurological:  Positive for numbness and headaches. Negative for dizziness.  Hematological:  Negative for swollen glands.  Psychiatric/Behavioral:  Positive for depressed mood and sleep disturbance. The patient is not nervous/anxious.     PMFS History:  Patient Active Problem List   Diagnosis Date Noted   Healthcare maintenance 09/26/2022   Insomnia 09/06/2021   Encounter for surveillance of contraceptive pills 08/12/2017   Fibromyalgia 11/06/2016   Uterine leiomyoma 12/20/2015   Essential hypertension 01/12/2013   Rheumatoid arthritis (HCC) 01/12/2013    Past Medical History:  Diagnosis Date   Anemia    Dysmenorrhea 12/20/2015   Eczema    mild   Fibroids 12/20/2015   Fibromyalgia    GERD (gastroesophageal reflux disease)    Hypertension    Menorrhagia with regular cycle 12/20/2015   PONV (postoperative nausea and vomiting)    Rash    Reflux    Rheumatoid arthritis (HCC)    Rhinitis    chonic   Vitamin D deficiency    Vulvar itching 12/20/2015    Family History  Problem Relation Age of Onset   Diabetes Mother    Hypertension Mother  Asthma Mother    Cancer Mother        breast   Thyroid disease Mother    Arthritis Mother    Luiz Blare' disease Mother    Cancer Maternal Grandmother        pancreatic   Fibroids Sister    COPD Maternal Aunt    Asthma Maternal Aunt    Gout Maternal Aunt    Past Surgical History:  Procedure Laterality Date   CHOLECYSTECTOMY N/A 02/08/2022   Procedure: LAPAROSCOPIC CHOLECYSTECTOMY;  Surgeon: Lewie Chamber, DO;  Location: [facility];   Service: General;  Laterality: N/A;   ESOPHAGOGASTRODUODENOSCOPY ENDOSCOPY     IR GENERIC HISTORICAL  02/07/2016   IR RADIOLOGIST EVAL & MGMT 02/07/2016 Simonne Come, MD [facility] INTERV RAD   WISDOM TOOTH EXTRACTION     Social History   Social History Narrative   Not on file   Immunization History  Administered Date(s) Administered   DT (Pediatric) 02/20/2013   Influenza-Unspecified 07/18/2009, 05/16/2016, 06/03/2017, 04/07/2018, 04/27/2021   PFIZER(Purple Top)SARS-COV-2 Vaccination 04/01/2020, 04/22/2020   Pneumococcal Polysaccharide-23 05/08/2007     Objective: Vital Signs: BP (!) 141/92 (BP Location: Left Arm, Patient Position: Sitting, Cuff Size: Normal)   Pulse 76   Resp 16   Ht 5\' 5"  (1.651 m)   Wt 209 lb 6.4 oz (95 kg)   BMI 34.85 kg/m    Physical Exam Vitals and nursing note reviewed.  Constitutional:      Appearance: She is well-developed.  HENT:     Head: Normocephalic and atraumatic.  Eyes:     Conjunctiva/sclera: Conjunctivae normal.  Cardiovascular:     Rate and Rhythm: Normal rate and regular rhythm.     Heart sounds: Normal heart sounds.  Pulmonary:     Effort: Pulmonary effort is normal.     Breath sounds: Normal breath sounds.  Abdominal:     General: Bowel sounds are normal.     Palpations: Abdomen is soft.  Musculoskeletal:     Cervical back: Normal range of motion.  Lymphadenopathy:     Cervical: No cervical adenopathy.  Skin:    General: Skin is warm and dry.     Capillary Refill: Capillary refill takes less than 2 seconds.  Neurological:     Mental Status: She is alert and oriented to person, place, and time.  Psychiatric:        Behavior: Behavior normal.      Musculoskeletal Exam: She had good range of motion of the cervical spine.  She had painful limited range of motion of her lumbar spine.  She had painful range of motion of her shoulders, elbows, wrist joints, MCPs PIPs without any synovitis.  Hip joints and knee joints in good range of  motion.  No warmth swelling or effusion was noted.  There was no tenderness over ankles or MTPs.  She had generalized hyperalgesia and positive tender points.  CDAI Exam: CDAI Score: -- Patient Global: 20 / 100; Provider Global: 20 / 100 Swollen: --; Tender: -- Joint Exam 01/11/2023   No joint exam has been documented for this visit   There is currently no information documented on the homunculus. Go to the Rheumatology activity and complete the homunculus joint exam.  Investigation: No additional findings.  Imaging: No results found.   IMPRESSION: 1. Lumbar spondylosis and degenerative disc disease, causing mild displacement of the right L2 nerve in the lateral extraforaminal space at L2-3 and borderline right foraminal stenosis at L5-S1. 2. Large heterogeneous presacral structure  believed to represent fibroid uterus, only partially included on today's exam.     Electronically Signed   By: Gaylyn Rong M.D.   On: 09/01/2022 12:48 Recent Labs: Lab Results  Component Value Date   WBC 4.2 11/23/2022   HGB 12.9 11/23/2022   PLT 236 11/23/2022   NA 139 11/23/2022   K 4.2 11/23/2022   CL 105 11/23/2022   CO2 28 11/23/2022   GLUCOSE 95 11/23/2022   BUN 11 11/23/2022   CREATININE 0.78 11/23/2022   BILITOT 0.2 11/23/2022   ALKPHOS 76 03/06/2022   AST 20 11/23/2022   ALT 25 11/23/2022   PROT 6.4 11/23/2022   ALBUMIN 4.0 03/06/2022   CALCIUM 8.8 11/23/2022   GFRAA 92 06/10/2020   QFTBGOLDPLUS Negative 06/29/2021    Speciality Comments: TB Gold: 05/07/2022 Neg   Prior therapy includes: methotrexate and Plaquenil (neutropenia) and Humira (painful injections)  Procedures:  No procedures performed Allergies: Ceftin [cefuroxime axetil], Humira [adalimumab], and Methotrexate derivatives   Assessment / Plan:     Visit Diagnoses: Rheumatoid arthritis involving multiple sites with positive rheumatoid factor (HCC)-patient denies any flares of rheumatoid arthritis.  She  denies any history of joint swelling.  No synovitis was noted.  She has been taking Enbrel 50 mg subcutaneous without any interruption.  High risk medication use - Enbrel 50 mg sq injection once weekly.  Labs obtained on November 23, 2022 CBC and CMP were normal.  She was advised to get labs every 3 months.  TB Gold was negative on May 07, 2022.  Information on immunization was placed in the AVS.  She was advised to hold her Enbrel if she develops an infection resume after the infection resolves.  Annual skin examination to screen for skin cancer was advised.  Sun protection and use of sunscreen was discussed.  DDD (degenerative disc disease), lumbar-patient was recently just closed with degenerative disease of lumbar spine by [facility].  She was referred to physical therapy.  Patient states despite going to physical therapy on a regular basis she continues to have severe pain and discomfort in her lower back.  Patient denies any radiculopathy.  She was tearful throughout the visit.  She would like second opinion.  I will refer her to [facility] center.  Fibromyalgia-she continues to have generalized pain and discomfort from fibromyalgia.  She had generalized hyperalgesia.  She has been on Cymbalta, tramadol, methocarbamol, meloxicam without getting much relief.  Will refer to pain management.  Other fatigue-she has been experiencing fatigue due to insomnia and fibromyalgia.  History of insomnia-she gives history of primary insomnia and also increased insomnia due to nocturnal pain.  History of gastroesophageal reflux (GERD)  History of hypertension-blood pressure was elevated at 162/96 today.  Repeat blood pressure was 141/92.  She was advised to monitor blood pressure closely and follow-up with the PCP.  Orders: Orders Placed This Encounter  Procedures   Ambulatory referral to Physical Medicine Rehab   Ambulatory referral to Physical Therapy   Ambulatory referral to Orthopedics   No  orders of the defined types were placed in this encounter.    Follow-Up Instructions: Return in about 5 months (around 06/13/2023) for Rheumatoid arthritis.   Pollyann Savoy, MD  Note - This record has been created using Animal nutritionist.  Chart creation errors have been sought, but may not always  have been located. Such creation errors do not reflect on  the standard of medical care.

## 2023-01-03 ENCOUNTER — Other Ambulatory Visit (HOSPITAL_COMMUNITY): Payer: Self-pay

## 2023-01-07 ENCOUNTER — Other Ambulatory Visit (HOSPITAL_COMMUNITY): Payer: Self-pay

## 2023-01-09 ENCOUNTER — Encounter (HOSPITAL_COMMUNITY): Payer: Self-pay

## 2023-01-09 ENCOUNTER — Other Ambulatory Visit (HOSPITAL_COMMUNITY): Payer: Self-pay

## 2023-01-11 ENCOUNTER — Encounter: Payer: Self-pay | Admitting: Rheumatology

## 2023-01-11 ENCOUNTER — Ambulatory Visit: Payer: Commercial Managed Care - PPO | Attending: Plastic Surgery | Admitting: Rheumatology

## 2023-01-11 ENCOUNTER — Other Ambulatory Visit (HOSPITAL_COMMUNITY): Payer: Self-pay

## 2023-01-11 VITALS — BP 141/92 | HR 76 | Resp 16 | Ht 65.0 in | Wt 209.4 lb

## 2023-01-11 DIAGNOSIS — R748 Abnormal levels of other serum enzymes: Secondary | ICD-10-CM

## 2023-01-11 DIAGNOSIS — Z8719 Personal history of other diseases of the digestive system: Secondary | ICD-10-CM | POA: Diagnosis not present

## 2023-01-11 DIAGNOSIS — R7989 Other specified abnormal findings of blood chemistry: Secondary | ICD-10-CM

## 2023-01-11 DIAGNOSIS — R5383 Other fatigue: Secondary | ICD-10-CM | POA: Diagnosis not present

## 2023-01-11 DIAGNOSIS — M0579 Rheumatoid arthritis with rheumatoid factor of multiple sites without organ or systems involvement: Secondary | ICD-10-CM | POA: Diagnosis not present

## 2023-01-11 DIAGNOSIS — Z79899 Other long term (current) drug therapy: Secondary | ICD-10-CM | POA: Diagnosis not present

## 2023-01-11 DIAGNOSIS — Z8679 Personal history of other diseases of the circulatory system: Secondary | ICD-10-CM | POA: Insufficient documentation

## 2023-01-11 DIAGNOSIS — M797 Fibromyalgia: Secondary | ICD-10-CM | POA: Diagnosis not present

## 2023-01-11 DIAGNOSIS — Z87898 Personal history of other specified conditions: Secondary | ICD-10-CM | POA: Diagnosis not present

## 2023-01-11 DIAGNOSIS — M5136 Other intervertebral disc degeneration, lumbar region: Secondary | ICD-10-CM | POA: Insufficient documentation

## 2023-01-11 NOTE — Patient Instructions (Addendum)
Standing Labs We placed an order today for your standing lab work.   Please have your standing labs drawn in July and every 3 months  Please have your labs drawn 2 weeks prior to your appointment so that the provider can discuss your lab results at your appointment, if possible.  Please note that you may see your imaging and lab results in MyChart before we have reviewed them. We will contact you once all results are reviewed. Please allow our office up to 72 hours to thoroughly review all of the results before contacting the office for clarification of your results.  WALK-IN LAB HOURS  Monday through Thursday from 8:00 am -12:30 pm and 1:00 pm-5:00 pm and Friday from 8:00 am-12:00 pm.  Patients with office visits requiring labs will be seen before walk-in labs.  You may encounter longer than normal wait times. Please allow additional time. Wait times may be shorter on  Monday and Thursday afternoons.  We do not book appointments for walk-in labs. We appreciate your patience and understanding with our staff.   Labs are drawn by [facility]. Please bring your co-pay at the time of your lab draw.  You may receive a bill from [facility] for your lab work.  Please note if you are on Hydroxychloroquine and and an order has been placed for a Hydroxychloroquine level,  you will need to have it drawn 4 hours or more after your last dose.  If you wish to have your labs drawn at another location, please call the office 24 hours in advance so we can fax the orders.  The office is located at [address], [address], [place], [place] [address]   If you have any questions regarding directions or hours of operation,  please call [phone].   As a reminder, please drink plenty of water prior to coming for your lab work. Thanks!   Vaccines You are taking a medication(s) that can suppress your immune system.  The following immunizations are recommended: Flu annually Covid-19  Td/Tdap (tetanus, diphtheria,  pertussis) every 10 years Pneumonia (Prevnar 15 then Pneumovax 23 at least 1 year apart.  Alternatively, can take Prevnar 20 without needing additional dose) Shingrix: 2 doses from 4 weeks to 6 months apart  Please check with your PCP to make sure you are up to date.   If you have signs or symptoms of an infection or start antibiotics: First, call your PCP for workup of your infection. Hold your medication through the infection, until you complete your antibiotics, and until symptoms resolve if you take the following: Injectable medication (Actemra, Benlysta, Cimzia, Cosentyx, Enbrel, Humira, Kevzara, Orencia, Remicade, Simponi, Stelara, Taltz, Tremfya) Methotrexate Leflunomide (Arava) Mycophenolate (Cellcept) Xeljanz, Olumiant, or Rinvoq   Please get an annual skin examination  to screen for skin cancer while you are on Enbrel.  Please use sunscreen and sun protection. 

## 2023-01-14 ENCOUNTER — Other Ambulatory Visit (HOSPITAL_COMMUNITY): Payer: Self-pay

## 2023-01-14 ENCOUNTER — Telehealth: Payer: Self-pay | Admitting: Rheumatology

## 2023-01-14 NOTE — Telephone Encounter (Signed)
Patient called stating Dr. Corliss Skains referred her to [facility].  Patient states they are out of network with her [employer].

## 2023-01-14 NOTE — Telephone Encounter (Signed)
I called patient, patient will advise if she wants referral changed.

## 2023-01-15 DIAGNOSIS — M461 Sacroiliitis, not elsewhere classified: Secondary | ICD-10-CM | POA: Diagnosis not present

## 2023-01-18 DIAGNOSIS — M461 Sacroiliitis, not elsewhere classified: Secondary | ICD-10-CM | POA: Diagnosis not present

## 2023-01-22 ENCOUNTER — Other Ambulatory Visit: Payer: Self-pay | Admitting: Physician Assistant

## 2023-01-23 ENCOUNTER — Other Ambulatory Visit (HOSPITAL_COMMUNITY): Payer: Self-pay

## 2023-01-23 ENCOUNTER — Other Ambulatory Visit: Payer: Self-pay

## 2023-01-23 MED ORDER — DULOXETINE HCL 60 MG PO CPEP
60.0000 mg | ORAL_CAPSULE | Freq: Every day | ORAL | 2 refills | Status: DC
  Filled 2023-01-23: qty 30, 30d supply, fill #0
  Filled 2023-02-24: qty 30, 30d supply, fill #1
  Filled 2023-03-25: qty 30, 30d supply, fill #2

## 2023-01-23 NOTE — Telephone Encounter (Signed)
Last Fill: 10/29/2022  Next Visit: 06/19/2023  Last Visit: 01/11/2023  Dx: Fibromyalgia   Current Dose per office note on 01/11/2023: not dicussed  Okay to refill Cymbalta?

## 2023-02-01 ENCOUNTER — Other Ambulatory Visit (HOSPITAL_COMMUNITY): Payer: Self-pay

## 2023-02-01 ENCOUNTER — Other Ambulatory Visit: Payer: Self-pay

## 2023-02-01 DIAGNOSIS — M461 Sacroiliitis, not elsewhere classified: Secondary | ICD-10-CM | POA: Diagnosis not present

## 2023-02-04 ENCOUNTER — Other Ambulatory Visit: Payer: Self-pay

## 2023-02-13 ENCOUNTER — Other Ambulatory Visit: Payer: Self-pay

## 2023-02-13 ENCOUNTER — Other Ambulatory Visit (HOSPITAL_COMMUNITY): Payer: Self-pay

## 2023-02-18 ENCOUNTER — Other Ambulatory Visit (HOSPITAL_COMMUNITY): Payer: Self-pay

## 2023-02-24 ENCOUNTER — Other Ambulatory Visit (HOSPITAL_COMMUNITY): Payer: Self-pay

## 2023-02-25 ENCOUNTER — Encounter: Payer: Self-pay | Admitting: Pharmacist

## 2023-02-25 ENCOUNTER — Other Ambulatory Visit: Payer: Self-pay

## 2023-02-26 ENCOUNTER — Other Ambulatory Visit (HOSPITAL_COMMUNITY): Payer: Self-pay

## 2023-02-27 ENCOUNTER — Other Ambulatory Visit (HOSPITAL_COMMUNITY): Payer: Self-pay

## 2023-03-04 ENCOUNTER — Other Ambulatory Visit (HOSPITAL_COMMUNITY): Payer: Self-pay

## 2023-03-04 DIAGNOSIS — M461 Sacroiliitis, not elsewhere classified: Secondary | ICD-10-CM | POA: Diagnosis not present

## 2023-03-04 MED ORDER — DICLOFENAC SODIUM 50 MG PO TBEC
50.0000 mg | DELAYED_RELEASE_TABLET | Freq: Two times a day (BID) | ORAL | 2 refills | Status: DC
  Filled 2023-03-04: qty 60, 30d supply, fill #0
  Filled 2023-05-27: qty 60, 30d supply, fill #1

## 2023-03-09 ENCOUNTER — Ambulatory Visit
Admission: EM | Admit: 2023-03-09 | Discharge: 2023-03-09 | Disposition: A | Discharge status: Home / Self Care | Payer: Commercial Managed Care - PPO | Attending: Family Medicine | Admitting: Family Medicine

## 2023-03-09 DIAGNOSIS — Z1152 Encounter for screening for COVID-19: Secondary | ICD-10-CM | POA: Insufficient documentation

## 2023-03-09 DIAGNOSIS — J069 Acute upper respiratory infection, unspecified: Secondary | ICD-10-CM | POA: Insufficient documentation

## 2023-03-09 LAB — POCT RAPID STREP A (OFFICE): Rapid Strep A Screen: NEGATIVE

## 2023-03-09 MED ORDER — LIDOCAINE VISCOUS HCL 2 % MT SOLN: 10.0000 mL | OROMUCOSAL | 0 refills | Status: DC | PRN

## 2023-03-09 MED ORDER — FLUTICASONE PROPIONATE 50 MCG/ACT NA SUSP: 1.0000 | Freq: Two times a day (BID) | NASAL | 2 refills | Status: DC

## 2023-03-09 NOTE — ED Provider Notes (Signed)
[facility]    CSN: [id number] Arrival date & time: 03/09/23  8469      History   Chief Complaint Chief Complaint  Patient presents with   Sore Throat    HPI Karen Burton is a 47 y.o. female.   Patient presenting today with several day history of significant sore throat, chest tightness, sweats, lightheadedness for a brief period of time, sinus drainage.  Denies fever, body aches, chest pain, shortness of breath, abdominal pain, nausea vomiting or diarrhea.  So far trying antihistamines, salt water gargles, hot tea with minimal relief.  No known sick contacts recently.    Past Medical History:  Diagnosis Date   Anemia    Dysmenorrhea 12/20/2015   Eczema    mild   Fibroids 12/20/2015   Fibromyalgia    GERD (gastroesophageal reflux disease)    Hypertension    Menorrhagia with regular cycle 12/20/2015   PONV (postoperative nausea and vomiting)    Rash    Reflux    Rheumatoid arthritis (HCC)    Rhinitis    chonic   Vitamin D deficiency    Vulvar itching 12/20/2015    Patient Active Problem List   Diagnosis Date Noted   Healthcare maintenance 09/26/2022   Insomnia 09/06/2021   Encounter for surveillance of contraceptive pills 08/12/2017   Fibromyalgia 11/06/2016   Uterine leiomyoma 12/20/2015   Essential hypertension 01/12/2013   Rheumatoid arthritis (HCC) 01/12/2013    Past Surgical History:  Procedure Laterality Date   CHOLECYSTECTOMY N/A 02/08/2022   Procedure: LAPAROSCOPIC CHOLECYSTECTOMY;  Surgeon: Lewie Chamber, DO;  Location: [facility];  Service: General;  Laterality: N/A;   ESOPHAGOGASTRODUODENOSCOPY ENDOSCOPY     IR GENERIC HISTORICAL  02/07/2016   IR RADIOLOGIST EVAL & MGMT 02/07/2016 Simonne Come, MD [facility] RAD   WISDOM TOOTH EXTRACTION      OB History     Gravida  1   Para      Term      Preterm      AB  1   Living         SAB      IAB      Ectopic      Multiple      Live Births                Home Medications    Prior to Admission medications   Medication Sig Start Date End Date Taking? Authorizing Provider  fluticasone (FLONASE) 50 MCG/ACT nasal spray Place 1 spray into both nostrils 2 (two) times daily. 03/09/23  Yes Particia Nearing, PA-C  lidocaine (XYLOCAINE) 2 % solution Use as directed 10 mLs in the mouth or throat every 3 (three) hours as needed. 03/09/23  Yes Particia Nearing, PA-C  amLODipine (NORVASC) 5 MG tablet Take 1 tablet (5 mg total) by mouth daily for blood pressure. 09/26/22   Tommie Sams, DO  BIOTIN PO Take by mouth daily.    [provider]  Cholecalciferol (VITAMIN D-3 PO) Take by mouth daily.    [provider]  diclofenac (VOLTAREN) 50 MG EC tablet Take 1 tablet (50 mg total) by mouth 2 (two) times daily with a meal. Do not take with other NSAIDs. 03/04/23     docusate sodium (COLACE) 100 MG capsule Take 100 mg by mouth daily.     [provider]  DULoxetine (CYMBALTA) 60 MG capsule Take 1 capsule (60 mg total) by mouth daily. 01/23/23  Pollyann Savoy, MD  etanercept (ENBREL MINI) 50 MG/ML injection Inject 50 mg into the skin once a week. 12/04/22   Gearldine Bienenstock, PA-C  hydrochlorothiazide (HYDRODIURIL) 12.5 MG tablet Take 1 tablet (12.5 mg total) by mouth daily. 09/26/22   Tommie Sams, DO  levocetirizine (XYZAL) 5 MG tablet Take 5 mg by mouth as needed.    [provider]  losartan (COZAAR) 50 MG tablet Take 1 tablet (50 mg total) by mouth daily. 09/26/22   Everlene Other G, DO  melatonin 5 MG TABS Take 5 mg by mouth at bedtime as needed (sleep).    [provider]  meloxicam (MOBIC) 15 MG tablet Take 1 tablet (15 mg total) by mouth daily. Patient taking differently: Take 15 mg by mouth as needed. 11/16/22   Tommie Sams, DO  methocarbamol (ROBAXIN) 500 MG tablet Take 1 tablet (500 mg total) by mouth 2 (two) times daily as needed for muscle spasms. 11/09/22   Pollyann Savoy, MD  Multiple  Vitamins-Minerals (MULTIVITAMIN WOMEN PO) Take 1 tablet by mouth daily.    [provider]  Norethindrone Acetate-Ethinyl Estrad-FE (JUNEL FE 24) 1-20 MG-MCG(24) tablet TAKE 1 TABLET BY MOUTH ONCE DAILY (USE NDC [address]-40) 05/15/22 05/15/23  Adline Potter, NP  traMADol (ULTRAM) 50 MG tablet Take 1 tablet (50 mg total) by mouth every 6 (six) hours as needed. 08/29/22   Persons, [place], PA  traZODone (DESYREL) 50 MG tablet Take 1/2 tablet (25 mg total) by mouth at bedtime as needed. 09/26/22   Tommie Sams, DO  tretinoin (RETIN-A) 0.025 % cream Apply pea size amount to affected regions 2-3 x a week building up to every night as tolerated. Patient taking differently: Apply topically as needed. 09/24/22     zolpidem (AMBIEN) 5 MG tablet Take 1 tablet (5 mg total) by mouth at bedtime as needed for sleep. 03/06/22   Tommie Sams, DO    Family History Family History  Problem Relation Age of Onset   Diabetes Mother    Hypertension Mother    Asthma Mother    Cancer Mother        breast   Thyroid disease Mother    Arthritis Mother    Luiz Blare' disease Mother    Cancer Maternal Grandmother        pancreatic   Fibroids Sister    COPD Maternal Aunt    Asthma Maternal Aunt    Gout Maternal Aunt     Social History Social History   Tobacco Use   Smoking status: Never    Passive exposure: Never   Smokeless tobacco: Never  Vaping Use   Vaping status: Never Used  Substance Use Topics   Alcohol use: No   Drug use: Never     Allergies   Ceftin [cefuroxime axetil], Humira [adalimumab], and Methotrexate derivatives   Review of Systems Review of Systems Per HPI  Physical Exam Triage Vital Signs ED Triage Vitals  Encounter Vitals Group     BP 03/09/23 0834 (!) 135/93     Systolic BP Percentile --      Diastolic BP Percentile --      Pulse Rate 03/09/23 0834 97     Resp 03/09/23 0834 18     Temp 03/09/23 0834 98 F (36.7 C)     Temp Source 03/09/23 0834 Oral      SpO2 03/09/23 0834 95 %     Weight --      Height --  Head Circumference --      Peak Flow --      Pain Score 03/09/23 0832 0     Pain Loc --      Pain Education --      Exclude from Growth Chart --    No data found.  Updated Vital Signs BP (!) 135/93 (BP Location: Right Arm)   Pulse 97   Temp 98 F (36.7 C) (Oral)   Resp 18   SpO2 95%   Visual Acuity Right Eye Distance:   Left Eye Distance:   Bilateral Distance:    Right Eye Near:   Left Eye Near:    Bilateral Near:     Physical Exam Vitals and nursing note reviewed.  Constitutional:      Appearance: Normal appearance.  HENT:     Head: Atraumatic.     Right Ear: Tympanic membrane and external ear normal.     Left Ear: Tympanic membrane and external ear normal.     Nose: Rhinorrhea present.     Mouth/Throat:     Mouth: Mucous membranes are moist.     Pharynx: Posterior oropharyngeal erythema present.  Eyes:     Extraocular Movements: Extraocular movements intact.     Conjunctiva/sclera: Conjunctivae normal.  Cardiovascular:     Rate and Rhythm: Normal rate and regular rhythm.     Heart sounds: Normal heart sounds.  Pulmonary:     Effort: Pulmonary effort is normal.     Breath sounds: Normal breath sounds. No wheezing or rales.  Musculoskeletal:        General: Normal range of motion.     Cervical back: Normal range of motion and neck supple.  Lymphadenopathy:     Cervical: No cervical adenopathy.  Skin:    General: Skin is warm and dry.  Neurological:     Mental Status: She is alert and oriented to person, place, and time.  Psychiatric:        Mood and Affect: Mood normal.        Thought Content: Thought content normal.      UC Treatments / Results  Labs (all labs ordered are listed, but only abnormal results are displayed) Labs Reviewed  SARS CORONAVIRUS 2 (TAT 6-24 HRS)  POCT RAPID STREP A (OFFICE)    EKG   Radiology No results found.  Procedures Procedures (including critical  care time)  Medications Ordered in UC Medications - No data to display  Initial Impression / Assessment and Plan / UC Course  I have reviewed the triage vital signs and the nursing notes.  Pertinent labs & imaging results that were available during my care of the patient were reviewed by me and considered in my medical decision making (see chart for details).     Vitals and exam overall reassuring and suggestive of a viral illness today.  Rapid strep negative, COVID testing pending.  Treat symptomatically with Flonase, viscous lidocaine and supportive over-the-counter medications and home care.  Return for worsening symptoms.  Final Clinical Impressions(s) / UC Diagnoses   Final diagnoses:  Viral URI     Discharge Instructions      Your strep test was negative today.  We should have your COVID test results back tomorrow and someone will reach out if positive to discuss potential antiviral options.  I have sent over some medications to help with your symptoms and you may also take DayQuil, NyQuil, ibuprofen and Tylenol.    ED Prescriptions     Medication  Sig Dispense Auth. Provider   fluticasone (FLONASE) 50 MCG/ACT nasal spray Place 1 spray into both nostrils 2 (two) times daily. 16 g Particia Nearing, PA-C   lidocaine (XYLOCAINE) 2 % solution Use as directed 10 mLs in the mouth or throat every 3 (three) hours as needed. 100 mL Particia Nearing, [place]      PDMP not reviewed this encounter.   Roosvelt Maser [place], [place] 03/09/23 [phone]

## 2023-03-09 NOTE — ED Triage Notes (Addendum)
Pt states Wednesday she woke up with burning/ sore throat and Thursday she felt dizzy/ lightheaded. Patient states burning sensation to throat is intermittent and is the only symptoms at this time.  Home interventions: OTC medications and salt water gargles

## 2023-03-09 NOTE — Discharge Instructions (Signed)
Your strep test was negative today.  We should have your COVID test results back tomorrow and someone will reach out if positive to discuss potential antiviral options.  I have sent over some medications to help with your symptoms and you may also take DayQuil, NyQuil, ibuprofen and Tylenol.

## 2023-03-10 LAB — SARS CORONAVIRUS 2 (TAT 6-24 HRS): SARS Coronavirus 2: NEGATIVE

## 2023-03-12 ENCOUNTER — Ambulatory Visit (INDEPENDENT_AMBULATORY_CARE_PROVIDER_SITE_OTHER): Payer: Commercial Managed Care - PPO | Admitting: Family Medicine

## 2023-03-12 VITALS — BP 137/91 | HR 102 | Temp 98.7°F | Ht 65.0 in | Wt 212.6 lb

## 2023-03-12 DIAGNOSIS — J019 Acute sinusitis, unspecified: Secondary | ICD-10-CM | POA: Diagnosis not present

## 2023-03-12 DIAGNOSIS — J029 Acute pharyngitis, unspecified: Secondary | ICD-10-CM | POA: Diagnosis not present

## 2023-03-12 LAB — POCT RAPID STREP A (OFFICE): Rapid Strep A Screen: NEGATIVE

## 2023-03-12 MED ORDER — DOXYCYCLINE HYCLATE 100 MG PO TABS: 100.0000 mg | ORAL_TABLET | Freq: Two times a day (BID) | ORAL | 0 refills | Status: DC

## 2023-03-13 DIAGNOSIS — J019 Acute sinusitis, unspecified: Secondary | ICD-10-CM | POA: Insufficient documentation

## 2023-03-13 NOTE — Assessment & Plan Note (Signed)
Treating with doxycycline. 

## 2023-03-13 NOTE — Progress Notes (Signed)
Subjective:  Patient ID: Karen Burton, female    DOB: [birth year]  Age: 47 y.o. MRN: [id number]  CC: Respiratory symptoms  HPI:  47 year old female presents for evaluation of the above.   Patient reports she has been sick since last Wednesday.  She was seen in a local urgent care on 8/10.  Had a negative workup.  Was diagnosed with a viral URI.  Patient continues to feel poorly and has persistent symptoms.  She reports sore throat, chest tightness, sinus drainage, coughing.  Overall malaise and fatigue.  No documented fever.  No relieving factors.  Patient Active Problem List   Diagnosis Date Noted   Acute sinusitis 03/13/2023   Healthcare maintenance 09/26/2022   Insomnia 09/06/2021   Encounter for surveillance of contraceptive pills 08/12/2017   Fibromyalgia 11/06/2016   Uterine leiomyoma 12/20/2015   Essential hypertension 01/12/2013   Rheumatoid arthritis (HCC) 01/12/2013    Social Hx   Social History   Socioeconomic History   Marital status: Single    Spouse name: Not on file   Number of children: 0   Years of education: Not on file   Highest education level: Not on file  Occupational History   Not on file  Tobacco Use   Smoking status: Never    Passive exposure: Never   Smokeless tobacco: Never  Vaping Use   Vaping status: Never Used  Substance and Sexual Activity   Alcohol use: No   Drug use: Never   Sexual activity: Not Currently    Birth control/protection: Pill  Other Topics Concern   Not on file  Social History Narrative   Not on file   Social Determinants of Health   Financial Resource Strain: Low Risk  (05/15/2022)   Overall Financial Resource Strain (CARDIA)    Difficulty of Paying Living Expenses: Not hard at all  Food Insecurity: No Food Insecurity (05/15/2022)   Hunger Vital Sign    Worried About Running Out of Food in the Last Year: Never true    Ran Out of Food in the Last Year: Never true  Transportation Needs: No  Transportation Needs (05/15/2022)   PRAPARE - Administrator, Civil Service (Medical): No    Lack of Transportation (Non-Medical): No  Physical Activity: Insufficiently Active (05/15/2022)   Exercise Vital Sign    Days of Exercise per Week: 3 days    Minutes of Exercise per Session: 30 min  Stress: Stress Concern Present (05/15/2022)   [employer] of Occupational Health - Occupational Stress Questionnaire    Feeling of Stress : To some extent  Social Connections: Moderately Isolated (05/15/2022)   Social Connection and Isolation Panel [NHANES]    Frequency of Communication with Friends and Family: More than three times a week    Frequency of Social Gatherings with Friends and Family: More than three times a week    Attends Religious Services: More than 4 times per year    Active Member of [employer] or Organizations: No    Attends Engineer, structural: Never    Marital Status: Never married    Review of Systems Per HPI  Objective:  BP (!) 137/91   Pulse (!) 102   Temp 98.7 F (37.1 C)   Ht 5\' 5"  (1.651 m)   Wt 212 lb 9.6 oz (96.4 kg)   SpO2 98%   BMI 35.38 kg/m      03/12/2023    3:58 PM 03/09/2023    8:34 AM  01/11/2023   11:32 AM  BP/Weight  Systolic BP 137 135 141  Diastolic BP 91 93 92  Wt. (Lbs) 212.6    BMI 35.38 kg/m2      Physical Exam Vitals and nursing note reviewed.  Constitutional:      General: She is not in acute distress.    Appearance: Normal appearance.  HENT:     Head: Normocephalic and atraumatic.     Right Ear: Tympanic membrane normal.     Left Ear: Tympanic membrane normal.     Mouth/Throat:     Pharynx: Oropharynx is clear.  Cardiovascular:     Rate and Rhythm: Normal rate and regular rhythm.  Pulmonary:     Effort: Pulmonary effort is normal.     Breath sounds: Normal breath sounds. No wheezing, rhonchi or rales.  Neurological:     Mental Status: She is alert.     Lab Results  Component Value Date   WBC 4.2  11/23/2022   HGB 12.9 11/23/2022   HCT 38.3 11/23/2022   PLT 236 11/23/2022   GLUCOSE 95 11/23/2022   CHOL 139 03/06/2022   TRIG 134 03/06/2022   HDL 50 03/06/2022   LDLCALC 66 03/06/2022   ALT 25 11/23/2022   AST 20 11/23/2022   NA 139 11/23/2022   K 4.2 11/23/2022   CL 105 11/23/2022   CREATININE 0.78 11/23/2022   BUN 11 11/23/2022   CO2 28 11/23/2022   TSH 3.190 03/06/2022   HGBA1C 5.5 03/06/2022     Assessment & Plan:   Problem List Items Addressed This Visit       Respiratory   Acute sinusitis - Primary    Treating with doxycycline.       Relevant Medications   doxycycline (VIBRA-TABS) 100 MG tablet   Other Visit Diagnoses     Sore throat       Relevant Orders   Rapid Strep A (Completed)       Meds ordered this encounter  Medications   doxycycline (VIBRA-TABS) 100 MG tablet    Sig: Take 1 tablet (100 mg total) by mouth 2 (two) times daily.    Dispense:  14 tablet    Refill:  0    Follow-up:  Return if symptoms worsen or fail to improve.  Everlene Other DO [facility]

## 2023-03-18 ENCOUNTER — Other Ambulatory Visit (HOSPITAL_COMMUNITY): Payer: Self-pay

## 2023-03-20 ENCOUNTER — Other Ambulatory Visit (HOSPITAL_COMMUNITY): Payer: Self-pay

## 2023-03-20 ENCOUNTER — Other Ambulatory Visit: Payer: Self-pay | Admitting: Physician Assistant

## 2023-03-20 ENCOUNTER — Other Ambulatory Visit: Payer: Self-pay | Admitting: *Deleted

## 2023-03-20 ENCOUNTER — Other Ambulatory Visit: Payer: Self-pay

## 2023-03-20 DIAGNOSIS — Z79899 Other long term (current) drug therapy: Secondary | ICD-10-CM

## 2023-03-20 DIAGNOSIS — M0579 Rheumatoid arthritis with rheumatoid factor of multiple sites without organ or systems involvement: Secondary | ICD-10-CM

## 2023-03-20 MED ORDER — ENBREL MINI 50 MG/ML ~~LOC~~ SOCT
50.0000 mg | SUBCUTANEOUS | 0 refills | Status: DC
  Filled 2023-03-20: qty 4, 28d supply, fill #0

## 2023-03-20 NOTE — Telephone Encounter (Signed)
Last Fill: 12/04/2022  Labs: 11/23/2022 CBC and CMP WNL   TB Gold: 05/07/2022 Neg    Next Visit: 06/19/2023  Last Visit: 01/11/2023  [id number] arthritis involving multiple sites with positive rheumatoid factor   Current Dose per office note 01/11/2023: Enbrel 50 mg sq injection once weekly.   Patient advised she is due to update labs. Patient plans to update this week. Orders have been released.   Okay to refill Enbrel?

## 2023-03-22 ENCOUNTER — Other Ambulatory Visit (HOSPITAL_COMMUNITY): Payer: Self-pay

## 2023-03-25 ENCOUNTER — Other Ambulatory Visit (HOSPITAL_COMMUNITY): Payer: Self-pay

## 2023-03-25 ENCOUNTER — Other Ambulatory Visit: Payer: Self-pay

## 2023-03-28 ENCOUNTER — Ambulatory Visit: Payer: Commercial Managed Care - PPO | Admitting: Family Medicine

## 2023-03-28 VITALS — BP 152/107 | HR 97 | Temp 98.2°F | Ht 65.0 in | Wt 213.8 lb

## 2023-03-28 DIAGNOSIS — M545 Low back pain, unspecified: Secondary | ICD-10-CM

## 2023-03-28 DIAGNOSIS — M797 Fibromyalgia: Secondary | ICD-10-CM

## 2023-03-28 DIAGNOSIS — G8929 Other chronic pain: Secondary | ICD-10-CM | POA: Diagnosis not present

## 2023-03-28 DIAGNOSIS — I10 Essential (primary) hypertension: Secondary | ICD-10-CM

## 2023-03-28 DIAGNOSIS — E669 Obesity, unspecified: Secondary | ICD-10-CM

## 2023-03-28 DIAGNOSIS — Z79899 Other long term (current) drug therapy: Secondary | ICD-10-CM | POA: Diagnosis not present

## 2023-03-28 MED ORDER — LOSARTAN POTASSIUM 50 MG PO TABS
50.0000 mg | ORAL_TABLET | Freq: Every day | ORAL | 3 refills | Status: DC
Start: 2023-03-28 — End: 2023-07-14

## 2023-03-28 MED ORDER — AMLODIPINE BESYLATE 5 MG PO TABS
5.0000 mg | ORAL_TABLET | Freq: Every day | ORAL | 3 refills | Status: DC
Start: 2023-03-28 — End: 2023-07-14

## 2023-03-28 MED ORDER — HYDROCHLOROTHIAZIDE 12.5 MG PO TABS
12.5000 mg | ORAL_TABLET | Freq: Every day | ORAL | 3 refills | Status: DC
Start: 2023-03-28 — End: 2023-07-14

## 2023-03-28 NOTE — Patient Instructions (Addendum)
Monitor BP @ home.  Referrals placed.  Follow up in 6 months.   Take care  Dr. Adriana Simas

## 2023-03-28 NOTE — Assessment & Plan Note (Signed)
BP elevated here today.  Well-controlled at home.  Advised to continue to monitor blood pressures at home.  Continue current medications.

## 2023-03-28 NOTE — Assessment & Plan Note (Signed)
Placing referral to pain management.

## 2023-03-28 NOTE — Assessment & Plan Note (Signed)
Insurance will not cover injectable weight loss medication.  Referring to nutrition.

## 2023-03-28 NOTE — Progress Notes (Signed)
Subjective:  Patient ID: Karen Burton, female    DOB: [birth year]  Age: 47 y.o. MRN: [id number]  CC: Follow up   HPI:  47 year old female with hypertension, rheumatoid arthritis, fibromyalgia presents for follow-up.  Patient reports that her blood pressures are well-controlled at home.  BP elevated here today.  She endorses compliance with her medication, amlodipine, losartan, and HCTZ.  Patient reports that she is having significant difficulty with low back pain.  She has been seen by 2 specialists and has underwent injections as well as physical therapy.  She states that she is getting ready to start aquatic therapy.  She is still quite troubled by her pain.  Orthopedics and rheumatology have recommended referral to pain management.  Referral has been placed but it has not been completed.  Will discuss today.  Patient states that she feels that her weight is contributing to her back pain.  She is interested in weight loss.  Will discuss this today.  Patient Active Problem List   Diagnosis Date Noted   Chronic bilateral low back pain 03/28/2023   Obesity (BMI 30-39.9) 03/28/2023   Healthcare maintenance 09/26/2022   Insomnia 09/06/2021   Encounter for surveillance of contraceptive pills 08/12/2017   Fibromyalgia 11/06/2016   Uterine leiomyoma 12/20/2015   Essential hypertension 01/12/2013   Rheumatoid arthritis (HCC) 01/12/2013    Social Hx   Social History   Socioeconomic History   Marital status: Single    Spouse name: Not on file   Number of children: 0   Years of education: Not on file   Highest education level: Not on file  Occupational History   Not on file  Tobacco Use   Smoking status: Never    Passive exposure: Never   Smokeless tobacco: Never  Vaping Use   Vaping status: Never Used  Substance and Sexual Activity   Alcohol use: No   Drug use: Never   Sexual activity: Not Currently    Birth control/protection: Pill  Other Topics Concern   Not  on file  Social History Narrative   Not on file   Social Determinants of Health   Financial Resource Strain: Low Risk  (05/15/2022)   Overall Financial Resource Strain (CARDIA)    Difficulty of Paying Living Expenses: Not hard at all  Food Insecurity: No Food Insecurity (05/15/2022)   Hunger Vital Sign    Worried About Running Out of Food in the Last Year: Never true    Ran Out of Food in the Last Year: Never true  Transportation Needs: No Transportation Needs (05/15/2022)   PRAPARE - Administrator, Civil Service (Medical): No    Lack of Transportation (Non-Medical): No  Physical Activity: Insufficiently Active (05/15/2022)   Exercise Vital Sign    Days of Exercise per Week: 3 days    Minutes of Exercise per Session: 30 min  Stress: Stress Concern Present (05/15/2022)   [employer] of Occupational Health - Occupational Stress Questionnaire    Feeling of Stress : To some extent  Social Connections: Moderately Isolated (05/15/2022)   Social Connection and Isolation Panel [NHANES]    Frequency of Communication with Friends and Family: More than three times a week    Frequency of Social Gatherings with Friends and Family: More than three times a week    Attends Religious Services: More than 4 times per year    Active Member of [employer] or Organizations: No    Attends Banker Meetings: Never  Marital Status: Never married    Review of Systems Per HPI  Objective:  BP (!) 152/107   Pulse 97   Temp 98.2 F (36.8 C)   Ht 5\' 5"  (1.651 m)   Wt 213 lb 12.8 oz (97 kg)   SpO2 98%   BMI 35.58 kg/m      03/28/2023   10:06 AM 03/28/2023    9:28 AM 03/12/2023    3:58 PM  BP/Weight  Systolic BP 152 152 137  Diastolic BP 107 105 91  Wt. (Lbs)  213.8 212.6  BMI  35.58 kg/m2 35.38 kg/m2    Physical Exam Vitals and nursing note reviewed.  Constitutional:      General: She is not in acute distress.    Appearance: Normal appearance.  HENT:     Head:  Normocephalic and atraumatic.  Eyes:     General:        Right eye: No discharge.        Left eye: No discharge.     Conjunctiva/sclera: Conjunctivae normal.  Cardiovascular:     Rate and Rhythm: Normal rate and regular rhythm.  Pulmonary:     Effort: Pulmonary effort is normal.     Breath sounds: Normal breath sounds. No wheezing, rhonchi or rales.  Musculoskeletal:     Comments: Paraspinal musculature of the lumbar spine exquisitely tender to palpation.  Neurological:     Mental Status: She is alert.  Psychiatric:        Mood and Affect: Mood normal.        Behavior: Behavior normal.     Lab Results  Component Value Date   WBC 4.2 11/23/2022   HGB 12.9 11/23/2022   HCT 38.3 11/23/2022   PLT 236 11/23/2022   GLUCOSE 95 11/23/2022   CHOL 139 03/06/2022   TRIG 134 03/06/2022   HDL 50 03/06/2022   LDLCALC 66 03/06/2022   ALT 25 11/23/2022   AST 20 11/23/2022   NA 139 11/23/2022   K 4.2 11/23/2022   CL 105 11/23/2022   CREATININE 0.78 11/23/2022   BUN 11 11/23/2022   CO2 28 11/23/2022   TSH 3.190 03/06/2022   HGBA1C 5.5 03/06/2022     Assessment & Plan:   Problem List Items Addressed This Visit       Cardiovascular and Mediastinum   Essential hypertension - Primary    BP elevated here today.  Well-controlled at home.  Advised to continue to monitor blood pressures at home.  Continue current medications.      Relevant Medications   amLODipine (NORVASC) 5 MG tablet   hydrochlorothiazide (HYDRODIURIL) 12.5 MG tablet   losartan (COZAAR) 50 MG tablet     Other   Fibromyalgia   Relevant Orders   Ambulatory referral to Pain Clinic   Chronic bilateral low back pain    Placing referral to pain management.      Relevant Orders   Ambulatory referral to Pain Clinic   Obesity (BMI 30-39.9)    Insurance will not cover injectable weight loss medication.  Referring to nutrition.      Relevant Orders   Amb ref to Medical Nutrition Therapy-MNT    Meds ordered  this encounter  Medications   amLODipine (NORVASC) 5 MG tablet    Sig: Take 1 tablet (5 mg total) by mouth daily for blood pressure.    Dispense:  90 tablet    Refill:  3   hydrochlorothiazide (HYDRODIURIL) 12.5 MG tablet    Sig: Take  1 tablet (12.5 mg total) by mouth daily.    Dispense:  90 tablet    Refill:  3   losartan (COZAAR) 50 MG tablet    Sig: Take 1 tablet (50 mg total) by mouth daily.    Dispense:  90 tablet    Refill:  3    Follow-up:  Return in about 6 months (around 09/27/2023).  Everlene Other DO [facility]

## 2023-03-29 LAB — CBC WITH DIFFERENTIAL/PLATELET
Absolute Monocytes: 551 {cells}/uL (ref 200–950)
Basophils Absolute: 52 {cells}/uL (ref 0–200)
Basophils Relative: 0.9 %
Eosinophils Absolute: 81 {cells}/uL (ref 15–500)
Eosinophils Relative: 1.4 %
HCT: 39.5 % (ref 35.0–45.0)
Hemoglobin: 13.3 g/dL (ref 11.7–15.5)
Lymphs Abs: 1972 {cells}/uL (ref 850–3900)
MCH: 30.5 pg (ref 27.0–33.0)
MCHC: 33.7 g/dL (ref 32.0–36.0)
MCV: 90.6 fL (ref 80.0–100.0)
MPV: 10.1 fL (ref 7.5–12.5)
Monocytes Relative: 9.5 %
Neutro Abs: 3144 {cells}/uL (ref 1500–7800)
Neutrophils Relative %: 54.2 %
Platelets: 270 10*3/uL (ref 140–400)
RBC: 4.36 10*6/uL (ref 3.80–5.10)
RDW: 12.9 % (ref 11.0–15.0)
Total Lymphocyte: 34 %
WBC: 5.8 10*3/uL (ref 3.8–10.8)

## 2023-03-29 LAB — COMPLETE METABOLIC PANEL WITH GFR
AG Ratio: 1.2 (calc) (ref 1.0–2.5)
ALT: 35 U/L — ABNORMAL HIGH (ref 6–29)
AST: 24 U/L (ref 10–35)
Albumin: 3.7 g/dL (ref 3.6–5.1)
Alkaline phosphatase (APISO): 78 U/L (ref 31–125)
BUN: 12 mg/dL (ref 7–25)
CO2: 27 mmol/L (ref 20–32)
Calcium: 9.3 mg/dL (ref 8.6–10.2)
Chloride: 103 mmol/L (ref 98–110)
Creat: 0.95 mg/dL (ref 0.50–0.99)
Globulin: 3.1 g/dL (ref 1.9–3.7)
Glucose, Bld: 99 mg/dL (ref 65–99)
Potassium: 4 mmol/L (ref 3.5–5.3)
Sodium: 138 mmol/L (ref 135–146)
Total Bilirubin: 0.4 mg/dL (ref 0.2–1.2)
Total Protein: 6.8 g/dL (ref 6.1–8.1)
eGFR: 74 mL/min/{1.73_m2} (ref >=60)

## 2023-03-29 NOTE — Progress Notes (Signed)
CBC and CMP are normal except liver functions are mildly elevated.  I noticed that patient is on Voltaren tablet.  Patient should avoid all NSAIDs and alcohol use.

## 2023-04-08 ENCOUNTER — Encounter: Payer: Self-pay | Admitting: Rheumatology

## 2023-04-08 ENCOUNTER — Encounter: Payer: Self-pay | Admitting: Family Medicine

## 2023-04-09 NOTE — Telephone Encounter (Signed)
I called patient, patient has number.

## 2023-04-11 ENCOUNTER — Other Ambulatory Visit (HOSPITAL_COMMUNITY): Payer: Self-pay

## 2023-04-15 ENCOUNTER — Other Ambulatory Visit (HOSPITAL_COMMUNITY): Payer: Self-pay

## 2023-04-16 ENCOUNTER — Encounter: Payer: Self-pay | Admitting: Physical Medicine & Rehabilitation

## 2023-04-17 ENCOUNTER — Other Ambulatory Visit (HOSPITAL_COMMUNITY): Payer: Self-pay

## 2023-04-17 ENCOUNTER — Encounter (HOSPITAL_COMMUNITY): Payer: Self-pay

## 2023-04-18 ENCOUNTER — Other Ambulatory Visit: Payer: Self-pay

## 2023-04-22 ENCOUNTER — Other Ambulatory Visit: Payer: Self-pay | Admitting: Rheumatology

## 2023-04-22 MED ORDER — DULOXETINE HCL 60 MG PO CPEP
60.0000 mg | ORAL_CAPSULE | Freq: Every day | ORAL | 2 refills | Status: DC
  Filled 2023-04-22: qty 30, 30d supply, fill #0
  Filled 2023-05-27: qty 30, 30d supply, fill #1
  Filled 2023-06-21: qty 30, 30d supply, fill #2

## 2023-04-22 NOTE — Telephone Encounter (Signed)
Last Fill: 01/23/2023  Next Visit: 06/19/2023  Last Visit: 01/11/2023  Dx: Fibromyalgia   Current Dose per office note on 01/11/2023: not discussed.  Okay to refill Cymbalta?

## 2023-04-23 ENCOUNTER — Encounter (HOSPITAL_BASED_OUTPATIENT_CLINIC_OR_DEPARTMENT_OTHER): Payer: Self-pay | Admitting: Physical Therapy

## 2023-04-23 ENCOUNTER — Ambulatory Visit (HOSPITAL_BASED_OUTPATIENT_CLINIC_OR_DEPARTMENT_OTHER): Payer: Commercial Managed Care - PPO | Attending: Rheumatology | Admitting: Physical Therapy

## 2023-04-23 ENCOUNTER — Other Ambulatory Visit: Payer: Self-pay

## 2023-04-23 DIAGNOSIS — M5459 Other low back pain: Secondary | ICD-10-CM | POA: Diagnosis not present

## 2023-04-23 DIAGNOSIS — M545 Low back pain, unspecified: Secondary | ICD-10-CM

## 2023-04-23 DIAGNOSIS — M5136 Other intervertebral disc degeneration, lumbar region: Secondary | ICD-10-CM | POA: Diagnosis not present

## 2023-04-23 DIAGNOSIS — M797 Fibromyalgia: Secondary | ICD-10-CM | POA: Insufficient documentation

## 2023-04-23 DIAGNOSIS — M6281 Muscle weakness (generalized): Secondary | ICD-10-CM | POA: Insufficient documentation

## 2023-04-23 DIAGNOSIS — G8929 Other chronic pain: Secondary | ICD-10-CM

## 2023-04-23 NOTE — Therapy (Signed)
OUTPATIENT PHYSICAL THERAPY THORACOLUMBAR EVALUATION   Patient Name: Karen Burton MRN: [id number] DOB:07-28-1976, 47 y.o., female Today's Date: 04/23/2023  END OF SESSION:  PT End of Session - 04/23/23 1328     Visit Number 1    Number of Visits 16    Date for PT Re-Evaluation 11/28/22    Authorization Type [facility] [employer] PPO    PT Start Time 1120    PT Stop Time 1200    PT Time Calculation (min) 40 min    Activity Tolerance Patient tolerated treatment well    Behavior During Therapy [facility] for tasks assessed/performed             Past Medical History:  Diagnosis Date   Anemia    Dysmenorrhea 12/20/2015   Eczema    mild   Fibroids 12/20/2015   Fibromyalgia    GERD (gastroesophageal reflux disease)    Hypertension    Menorrhagia with regular cycle 12/20/2015   PONV (postoperative nausea and vomiting)    Rash    Reflux    Rheumatoid arthritis (HCC)    Rhinitis    chonic   Vitamin D deficiency    Vulvar itching 12/20/2015   Past Surgical History:  Procedure Laterality Date   CHOLECYSTECTOMY N/A 02/08/2022   Procedure: LAPAROSCOPIC CHOLECYSTECTOMY;  Surgeon: Lewie Chamber, DO;  Location: [facility];  Service: General;  Laterality: N/A;   ESOPHAGOGASTRODUODENOSCOPY ENDOSCOPY     IR GENERIC HISTORICAL  02/07/2016   IR RADIOLOGIST EVAL & MGMT 02/07/2016 Simonne Come, MD [facility] INTERV RAD   WISDOM TOOTH EXTRACTION     Patient Active Problem List   Diagnosis Date Noted   Chronic bilateral low back pain 03/28/2023   Obesity (BMI 30-39.9) 03/28/2023   Healthcare maintenance 09/26/2022   Insomnia 09/06/2021   Encounter for surveillance of contraceptive pills 08/12/2017   Fibromyalgia 11/06/2016   Uterine leiomyoma 12/20/2015   Essential hypertension 01/12/2013   Rheumatoid arthritis (HCC) 01/12/2013    PCP: Tommie Sams, DO   REFERRING PROVIDER: Pollyann Savoy, MD   REFERRING DIAG:   Fibromyalgia  M51.36 (ICD-10-CM) - DDD (degenerative disc  disease), lumbar    Rationale for Evaluation and Treatment: Rehabilitation  THERAPY DIAG:  Other low back pain - Plan: PT plan of care cert/re-cert  Muscle weakness (generalized) - Plan: PT plan of care cert/re-cert  Fibromyalgia - Plan: PT plan of care cert/re-cert  ONSET DATE: Jan 2024  SUBJECTIVE:  SUBJECTIVE STATEMENT: Karen Burton in Jan assisting her mother into a WC. Lifting job Banker) increases pain.  No meds help. May be fibromyalgia or RA.  Tried OPPT and it didn't help, made worse.  Sleep with heating pad.  Wakes 2-3x nightly.  Work 12 hour shifts.  Pain high afterward.  PERTINENT HISTORY:  seropositive rheumatoid arthritis, degenerative disc disease and fibromyalgia syndrome.  PAIN:  Are you having pain? Yes: NPRS scale: current/ "normal" 7/10; worst 8-9/10; least 5-6/10 Pain location: LB with bilat hip and gluts L>R Pain description: throbbing and stabbing Aggravating factors: standing/leaning Relieving factors: sitting,   PRECAUTIONS: None  RED FLAGS: None   WEIGHT BEARING RESTRICTIONS: No  FALLS:  Has patient fallen in last 6 months? No  LIVING ENVIRONMENT: Lives with: lives with their family Lives in: House/apartment Stairs: No ramped entrance Has following equipment at home: Single point cane, Environmental consultant - 2 wheeled, Wheelchair (manual), and Ramped entry  OCCUPATION: nurse; works at [employer]   PLOF: Independent  PATIENT GOALS: pain relief, be able to sleep, stand > 15 minutes  NEXT MD VISIT: Nov  OBJECTIVE:   DIAGNOSTIC FINDINGS:  IMPRESSION: 1. Lumbar spondylosis and degenerative disc disease, causing mild displacement of the right L2 nerve in the lateral extraforaminal space at L2-3 and borderline right foraminal stenosis at L5-S1. 2. Large heterogeneous presacral  structure believed to represent fibroid uterus, only partially included on today's exam.  PATIENT SURVEYS:  FOTO Primary measure 39% with goal of 53% at 12th visit    COGNITION: Overall cognitive status: Within functional limits for tasks assessed     POSTURE: rounded shoulders, forward head, and increased lumbar lordosis   PALPATION: TTP lumbar paraspinal  LUMBAR ROM:   P!=pain R sided>L AROM eval  Flexion Full P! rising  Extension   Right lateral flexion Full P!  Left lateral flexion Full P!  Right rotation wfl  Left rotation wfl   (Blank rows = not tested)  LOWER EXTREMITY ROM:     wfl  LOWER EXTREMITY MMT:    MMT Right eval Left eval  Hip flexion 44.6 48.5  Hip extension    Hip abduction    Hip adduction 30.5 34.6  Hip internal rotation    Hip external rotation    Knee flexion    Knee extension 42.7 43.8  Ankle dorsiflexion    Ankle plantarflexion    Ankle inversion    Ankle eversion     (Blank rows = not tested)  LUMBAR SPECIAL TESTS:  Slump test: Negative  FUNCTIONAL TESTS:  5 times sit to stand: 14.69  Last episode which finished in May 2024: 5 times sit to stand: 25.11 sec hands push up on legs  2 minute walk test: 506 ft   GAIT: Distance walked: 500 ft Assistive device utilized: None Level of assistance: Complete Independence Comments: wfl, increased cadence  TODAY'S TREATMENT:  Eval Functional testing   PATIENT EDUCATION:  Education details: Discussed eval findings, rehab rationale, aquatic program progression/POC and pools in area. Patient is in agreement  Person educated: Patient Education method: Explanation Education comprehension: verbalized understanding  HOME EXERCISE PROGRAM: [id number]   ASSESSMENT:  CLINICAL IMPRESSION: Karen Burton is a 47 y.o. female with seropositive rheumatoid  arthritis, degenerative disc disease, lumbar spondylolisthesis, foraminal stenosis  and fibromyalgia syndrome. She reports incident in Jan 2024 which caused injury to her back and she has in downward spiral since then.  She did have a trial of land based PT a few months ago which exacerbated pain.  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  She has high pain sensitivity which is consistent with dx's, she is pain limited with sleeping and with standing. It is unclear which dx is causing symptoms at this time but she feels it is the lumbar dysfunction. She does demonstrates strength deficits in hip abd and slowed 5x STS test scoring for age. She will benefit from skilled PT intervention with initiation in aquatic setting.  Will transition to land based as approp.  Due to high co-pay she has agreed to 6 visits with potential new pool access membership with indep completion of aquatic HEP  OBJECTIVE IMPAIRMENTS:  decreased activity tolerance, decreased ROM, impaired flexibility, postural dysfunction, and pain.   ACTIVITY LIMITATIONS: carrying, lifting, bending, sitting, standing, squatting, sleeping, stairs, transfers, bed mobility, reach over head, locomotion level, and caring for others   PARTICIPATION LIMITATIONS: meal prep, cleaning, laundry, driving, shopping, community activity, and occupation   PERSONAL FACTORS: Profession, 1-2 comorbidities: fibromyalgia, and RA  are also affecting patient's functional outcome.    REHAB POTENTIAL: Fair   CLINICAL DECISION MAKING: Evolving/moderate complexity  EVALUATION COMPLEXITY: Moderate   GOALS: Goals reviewed with patient? Yes  SHORT TERM GOALS: Target date: 06/07/23  Pt will report decrease in Normal pain to <4/10 Baseline:7/10 Goal status: INITIAL  2.  Pt will tolerate full aquatic sessions consistently without increase in pain and with improving function to demonstrate good toleration and effectiveness of  intervention.   Baseline:  Goal status: INITIAL  3.  Pt will report decreased frequency with waking at night due to pain  Baseline: 2-3 times nightly Goal status: INITIAL  4.  Pt to improve on Foto score to 45% to demonstrate improve perception of function. Baseline: 39 Goal status: INITIAL  5.  Pt will be indep with aquatic HEP and will have access to pool. Baseline: Indep with land based HEP Goal status: INITIAL  6.  Pt will be able to stand > 20 minutes without increase in pain to improve toleration to job. Baseline: <15 Goal status: INITIAL  7. Pt will improve strength in bilat hip abd by 10lbs to demonstrate improved overall physical function   LONG TERM GOALS: To be set at re-cert as approp   PLAN:  PT FREQUENCY: 1-2x/week  PT DURATION: 6 weeks  PLANNED INTERVENTIONS: Therapeutic exercises, Therapeutic activity, Neuromuscular re-education, Balance training, Gait training, Patient/Family education, Self Care, Joint mobilization, Stair training, Orthotic/Fit training, DME instructions, Aquatic Therapy, Dry Needling, Electrical stimulation, Cryotherapy, Moist heat, Taping, Ionotophoresis 4mg /ml Dexamethasone, Manual therapy, and Re-evaluation.  PLAN FOR NEXT SESSION: Aquatic intervention: strengthening hips and core; improved posture; balance retraining. Aquatic HEP   [place], PT 04/23/2023, 1:58 PM

## 2023-05-02 ENCOUNTER — Ambulatory Visit (HOSPITAL_BASED_OUTPATIENT_CLINIC_OR_DEPARTMENT_OTHER): Payer: Commercial Managed Care - PPO | Attending: Rheumatology | Admitting: Physical Therapy

## 2023-05-02 DIAGNOSIS — M5459 Other low back pain: Secondary | ICD-10-CM | POA: Diagnosis not present

## 2023-05-02 DIAGNOSIS — M797 Fibromyalgia: Secondary | ICD-10-CM | POA: Insufficient documentation

## 2023-05-02 DIAGNOSIS — M6281 Muscle weakness (generalized): Secondary | ICD-10-CM | POA: Diagnosis not present

## 2023-05-02 DIAGNOSIS — M545 Low back pain, unspecified: Secondary | ICD-10-CM | POA: Insufficient documentation

## 2023-05-02 NOTE — Therapy (Signed)
OUTPATIENT PHYSICAL THERAPY THORACOLUMBAR TREATMENT   Patient Name: Karen Burton MRN: [id number] DOB:August 07, 1975, 47 y.o., female Today's Date: 05/02/2023  END OF SESSION:  PT End of Session - 05/02/23 0847     Visit Number 2    Number of Visits 16    Date for PT Re-Evaluation 11/28/22    Authorization Type [facility] [employer] PPO    PT Start Time 0730    PT Stop Time 0815    PT Time Calculation (min) 45 min    Activity Tolerance Patient tolerated treatment well    Behavior During Therapy [facility] for tasks assessed/performed              Past Medical History:  Diagnosis Date   Anemia    Dysmenorrhea 12/20/2015   Eczema    mild   Fibroids 12/20/2015   Fibromyalgia    GERD (gastroesophageal reflux disease)    Hypertension    Menorrhagia with regular cycle 12/20/2015   PONV (postoperative nausea and vomiting)    Rash    Reflux    Rheumatoid arthritis (HCC)    Rhinitis    chonic   Vitamin D deficiency    Vulvar itching 12/20/2015   Past Surgical History:  Procedure Laterality Date   CHOLECYSTECTOMY N/A 02/08/2022   Procedure: LAPAROSCOPIC CHOLECYSTECTOMY;  Surgeon: Lewie Chamber, DO;  Location: [facility];  Service: General;  Laterality: N/A;   ESOPHAGOGASTRODUODENOSCOPY ENDOSCOPY     IR GENERIC HISTORICAL  02/07/2016   IR RADIOLOGIST EVAL & MGMT 02/07/2016 Simonne Come, MD [facility] INTERV RAD   WISDOM TOOTH EXTRACTION     Patient Active Problem List   Diagnosis Date Noted   Chronic bilateral low back pain 03/28/2023   Obesity (BMI 30-39.9) 03/28/2023   Healthcare maintenance 09/26/2022   Insomnia 09/06/2021   Encounter for surveillance of contraceptive pills 08/12/2017   Fibromyalgia 11/06/2016   Uterine leiomyoma 12/20/2015   Essential hypertension 01/12/2013   Rheumatoid arthritis (HCC) 01/12/2013    PCP: Tommie Sams, DO   REFERRING PROVIDER: Pollyann Savoy, MD   REFERRING DIAG:   Fibromyalgia  M51.36 (ICD-10-CM) - DDD (degenerative  disc disease), lumbar    Rationale for Evaluation and Treatment: Rehabilitation  THERAPY DIAG:  Other low back pain  Muscle weakness (generalized)  Fibromyalgia  Low back pain, unspecified back pain laterality, unspecified chronicity, unspecified whether sciatica present  ONSET DATE: Jan 2024  SUBJECTIVE:  SUBJECTIVE STATEMENT: " I worked 3 -12 hr days back to back, so I just stayed in the bed on Saturday".      [name] in Jan assisting her mother into a WC. Lifting job Banker) increases pain.  No meds help. May be fibromyalgia or RA.  Tried OPPT and it didn't help, made worse.  Sleep with heating pad.  Wakes 2-3x nightly.  Work 12 hour shifts.  Pain high afterward.  PERTINENT HISTORY:  seropositive rheumatoid arthritis, degenerative disc disease and fibromyalgia syndrome.  PAIN:  Are you having pain? Yes: NPRS scale: 3/10 Pain location: LB with bilat hip and gluts L>R Pain description: throbbing and stabbing Aggravating factors: standing/leaning Relieving factors: sitting,   PRECAUTIONS: None  RED FLAGS: None   WEIGHT BEARING RESTRICTIONS: No  FALLS:  Has patient fallen in last 6 months? No  LIVING ENVIRONMENT: Lives with: lives with their family Lives in: House/apartment Stairs: No ramped entrance Has following equipment at home: Single point cane, Environmental consultant - 2 wheeled, Wheelchair (manual), and Ramped entry  OCCUPATION: nurse; works at [employer]   PLOF: Independent  PATIENT GOALS: pain relief, be able to sleep, stand > 15 minutes  NEXT MD VISIT: Nov  OBJECTIVE:   DIAGNOSTIC FINDINGS:  IMPRESSION: 1. Lumbar spondylosis and degenerative disc disease, causing mild displacement of the right L2 nerve in the lateral extraforaminal space at L2-3 and borderline right foraminal  stenosis at L5-S1. 2. Large heterogeneous presacral structure believed to represent fibroid uterus, only partially included on today's exam.  PATIENT SURVEYS:  FOTO Primary measure 39% with goal of 53% at 12th visit    COGNITION: Overall cognitive status: Within functional limits for tasks assessed     POSTURE: rounded shoulders, forward head, and increased lumbar lordosis   PALPATION: TTP lumbar paraspinal  LUMBAR ROM:   P!=pain R sided>L AROM eval  Flexion Full P! rising  Extension   Right lateral flexion Full P!  Left lateral flexion Full P!  Right rotation wfl  Left rotation wfl   (Blank rows = not tested)  LOWER EXTREMITY ROM:     wfl  LOWER EXTREMITY MMT:    MMT Right eval Left eval  Hip flexion 44.6 48.5  Hip extension    Hip abduction    Hip adduction 30.5 34.6  Hip internal rotation    Hip external rotation    Knee flexion    Knee extension 42.7 43.8  Ankle dorsiflexion    Ankle plantarflexion    Ankle inversion    Ankle eversion     (Blank rows = not tested)  LUMBAR SPECIAL TESTS:  Slump test: Negative  FUNCTIONAL TESTS:  5 times sit to stand: 14.69  Last episode which finished in May 2024: 5 times sit to stand: 25.11 sec hands push up on legs  2 minute walk test: 506 ft   GAIT: Distance walked: 500 ft Assistive device utilized: None Level of assistance: Complete Independence Comments: wfl, increased cadence  TODAY'S TREATMENT:  Pt seen for aquatic therapy today.  Treatment took place in water 3.5-4.75 ft in depth at the [facility] pool. Temp of water was 91.  Pt entered/exited the pool via stairs in step-through pattern independently with bilat rail. * unsupported - walking forward/ backwards, multiple laps (cues for increased knee flexion during swing through and vertical trunk), side stepping  * side  stepping with arm addct/abdct with yellow hand floats * Farmer carry and walking/marching with single/bilat rainbow hand floats at side * UE on wall, relaxed squat  * UE on yellow hand floats:  LE swings into hip flex/ext and abdct/ addct  2 set of 5 each LE, each direction * TrA set with short hollow noodle pull down x 10 * straddling yellow noodle and holding corner: cycling for spine decompression (short trial with yellow hand floats at surface, near wall)  Pt requires the buoyancy and hydrostatic pressure of water for support, and to offload joints by unweighting joint load by at least 50 % in navel deep water and by at least 75-80% in chest to neck deep water.  Viscosity of the water is needed for resistance of strengthening. Water current perturbations provides challenge to standing balance requiring increased core activation.     PATIENT EDUCATION:  Education details: intro to aquatic therapy  Person educated: Patient Education method: Explanation Education comprehension: verbalized understanding  HOME EXERCISE PROGRAM: [id number]   ASSESSMENT:  CLINICAL IMPRESSION: Pt demonstrated decreased balance in Lt SLS than Rt.  Pt reported reduction of pain in back when exercising in the water.  Some "pinching" sensations in low back with hip ext with LE swings. Discussed importance of abdominal bracing at work before pushing/pulling etc to protect back.   Goals are ongoing.    From Initial evaluation:  Narda Graft is a 47 y.o. female with seropositive rheumatoid arthritis, degenerative disc disease, lumbar spondylolisthesis, foraminal stenosis  and fibromyalgia syndrome. She reports incident in Jan 2024 which caused injury to her back and she has in downward spiral since then.  She did have a trial of land based PT a few months ago which exacerbated pain.  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  She has high pain sensitivity which is consistent  with dx's, she is pain limited with sleeping and with standing. It is unclear which dx is causing symptoms at this time but she feels it is the lumbar dysfunction. She does demonstrates strength deficits in hip abd and slowed 5x STS test scoring for age. She will benefit from skilled PT intervention with initiation in aquatic setting.  Will transition to land based as approp.  Due to high co-pay she has agreed to 6 visits with potential new pool access membership with indep completion of aquatic HEP  OBJECTIVE IMPAIRMENTS:  decreased activity tolerance, decreased ROM, impaired flexibility, postural dysfunction, and pain.   ACTIVITY LIMITATIONS: carrying, lifting, bending, sitting, standing, squatting, sleeping, stairs, transfers, bed mobility, reach over head, locomotion level, and caring for others   PARTICIPATION LIMITATIONS: meal prep, cleaning, laundry, driving, shopping, community activity, and occupation   PERSONAL FACTORS: Profession, 1-2 comorbidities: fibromyalgia, and RA  are also affecting patient's functional outcome.    REHAB POTENTIAL: Fair   CLINICAL DECISION MAKING: Evolving/moderate complexity  EVALUATION COMPLEXITY: Moderate   GOALS: Goals reviewed with patient? Yes  SHORT TERM GOALS: Target date: 06/07/23  Pt will report decrease in Normal pain to <4/10 Baseline:7/10 Goal status: INITIAL  2.  Pt will tolerate full aquatic sessions consistently without increase in pain and with improving function to demonstrate good toleration and effectiveness of intervention.   Baseline:  Goal status: INITIAL  3.  Pt will report decreased frequency with waking at night due to pain  Baseline: 2-3 times nightly Goal status: INITIAL  4.  Pt to improve on Foto score to 45% to demonstrate improve perception of function. Baseline: 39 Goal status: INITIAL  5.  Pt will be indep with aquatic HEP and will have access to pool. Baseline: Indep with land based HEP Goal status:  INITIAL  6.  Pt will be able to stand > 20 minutes without increase in pain to improve toleration to job. Baseline: <15 Goal status: INITIAL  7. Pt will improve strength in bilat hip abd by 10lbs to demonstrate improved overall physical function   LONG TERM GOALS: To be set at re-cert as approp   PLAN:  PT FREQUENCY: 1-2x/week  PT DURATION: 6 weeks  PLANNED INTERVENTIONS: Therapeutic exercises, Therapeutic activity, Neuromuscular re-education, Balance training, Gait training, Patient/Family education, Self Care, Joint mobilization, Stair training, Orthotic/Fit training, DME instructions, Aquatic Therapy, Dry Needling, Electrical stimulation, Cryotherapy, Moist heat, Taping, Ionotophoresis 4mg /ml Dexamethasone, Manual therapy, and Re-evaluation.  PLAN FOR NEXT SESSION: Aquatic intervention: strengthening hips and core; improved posture; balance retraining. Aquatic HEP  Mayer Camel, PTA 05/02/23 9:05 AM [facility] [address] [place], [place], [address] Phone: [phone]   Fax:  [phone]

## 2023-05-03 ENCOUNTER — Encounter
Payer: Commercial Managed Care - PPO | Attending: Physical Medicine & Rehabilitation | Admitting: Physical Medicine & Rehabilitation

## 2023-05-03 ENCOUNTER — Other Ambulatory Visit (HOSPITAL_COMMUNITY): Payer: Self-pay

## 2023-05-03 ENCOUNTER — Other Ambulatory Visit: Payer: Self-pay | Admitting: Rheumatology

## 2023-05-03 ENCOUNTER — Other Ambulatory Visit: Payer: Self-pay

## 2023-05-03 ENCOUNTER — Other Ambulatory Visit (HOSPITAL_COMMUNITY): Payer: Self-pay | Admitting: Pharmacy Technician

## 2023-05-03 VITALS — BP 137/92 | HR 84 | Ht 65.0 in | Wt 217.0 lb

## 2023-05-03 DIAGNOSIS — F39 Unspecified mood [affective] disorder: Secondary | ICD-10-CM | POA: Insufficient documentation

## 2023-05-03 DIAGNOSIS — M0579 Rheumatoid arthritis with rheumatoid factor of multiple sites without organ or systems involvement: Secondary | ICD-10-CM | POA: Diagnosis not present

## 2023-05-03 DIAGNOSIS — M545 Low back pain, unspecified: Secondary | ICD-10-CM | POA: Diagnosis not present

## 2023-05-03 DIAGNOSIS — M797 Fibromyalgia: Secondary | ICD-10-CM | POA: Diagnosis not present

## 2023-05-03 DIAGNOSIS — G8929 Other chronic pain: Secondary | ICD-10-CM | POA: Diagnosis not present

## 2023-05-03 MED ORDER — PREGABALIN 50 MG PO CAPS
50.0000 mg | ORAL_CAPSULE | Freq: Two times a day (BID) | ORAL | 0 refills | Status: DC
  Filled 2023-05-03: qty 60, 30d supply, fill #0

## 2023-05-03 NOTE — Telephone Encounter (Signed)
Last Fill: 03/20/2023 (30 day supply)  Labs: 03/28/2023 CBC and CMP are normal except liver functions are mildly elevated.   TB Gold: 05/07/2022 Neg    Next Visit: 06/19/2023  Last Visit: 01/11/2023  DX: Rheumatoid arthritis involving multiple sites with positive rheumatoid factor   Current Dose per office note 01/11/2023: Enbrel 50 mg sq injection once weekly.   Okay to refill Enbrel?

## 2023-05-03 NOTE — Progress Notes (Signed)
Specialty Pharmacy Refill Coordination Note  Karen Burton is a 47 y.o. female contacted today regarding refills of specialty medication(s) Etanercept   Patient requested Delivery   Delivery date: 05/08/23   Verified address: [address] , [place]   Medication will be filled on 05/07/23.   Refill request sent to MD; Call if any delays.   Patient states She spoke with [place] weeks ago & needed to get a new Enbrel card but has not received it in the mail. Because the one she has was expired or expiring soon. Advise patient to follow up with Enbrel company.

## 2023-05-03 NOTE — Progress Notes (Unsigned)
Subjective:    Patient ID: Karen Burton, female    DOB: [birth year], 47 y.o.   MRN: [id number]  HPI  HPI  Karen Burton is a 47 y.o. year old female  who  has a past medical history of Anemia, Dysmenorrhea (12/20/2015), Eczema, Fibroids (12/20/2015), Fibromyalgia, GERD (gastroesophageal reflux disease), Hypertension, Menorrhagia with regular cycle (12/20/2015), PONV (postoperative nausea and vomiting), Rash, Reflux, Rheumatoid arthritis (HCC), Rhinitis, Vitamin D deficiency, and Vulvar itching (12/20/2015).   They are presenting to PM&R clinic as a new patient for pain management evaluation. They were referred by Dr. Adriana Simas for treatment of fibromyalgia pain.  She has a hx of RA for about 17 years ago. Pain started in the lower back. Back, hips Buttocks. She is tender in her arms and legs.  Neck and shoulder are painful.  Pain is everywhere in her back. She reports her body is stressed. Denies shooting pain down her legs. Throbbing pain in her hips.   She is sensitive to medications.  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   Mood decreased at time.  Denies SI or HI. Worried about working/taking care of her mother.   + poor sleep, takes ambien sometimes  Lay around on days off - to much   Frequent constipation- hx of IBS   Red flag symptoms: No red flags for back pain endorsed in Hx or ROS  Medications tried: Topical medications- Aleve roll on  Nsaids Ibuprofen- limited by liver enzyme elevation, meloxicam- didn't help much  Tylenol  Helps a little  Opiates  Tramadol- didn't help , used this for short time, helped her sleep  Gabapentin- tried it one time  TCAs  denies  SNRIs  Cymbalta- has been on it for a while Robaxin- didn't help   Other treatments: PT - March 2024, restarted Aquatic therapy TENs unit  has not tried it before  Injections- SI joint injections- didn't help , Trigger point injections- didn't help  Dry needling didn't help   Surgery  Denies joint/back surgeries   Goals for pain control: ***  Prior UDS results: No results found for: "LABOPIA", "COCAINSCRNUR", "LABBENZ", "AMPHETMU", "THCU", "LABBARB"    Pain Inventory Average Pain 7 Pain Right Now 6 My pain is constant, sharp, dull, and aching  In the last 24 hours, has pain interfered with the following? General activity 7 Relation with others 8 Enjoyment of life 9 What TIME of day is your pain at its worst? morning , evening, and night Sleep (in general) Poor  Pain is worse with: walking, bending, sitting, inactivity, and standing Pain improves with:  . Relief from Meds:  .  walk without assistance  employed # of hrs/week 36+  No problems in this area  New pt  New pt    Family History  Problem Relation Age of Onset   Diabetes Mother    Hypertension Mother    Asthma Mother    Cancer Mother        breast   Thyroid disease Mother    Arthritis Mother    Luiz Blare' disease Mother    Cancer Maternal Grandmother        pancreatic   Fibroids Sister    COPD Maternal Aunt    Asthma Maternal Aunt    Gout Maternal Aunt    Social History   Socioeconomic History   Marital status: Single    Spouse name: Not on file   Number of children: 0   Years of  education: Not on file   Highest education level: Not on file  Occupational History   Not on file  Tobacco Use   Smoking status: Never    Passive exposure: Never   Smokeless tobacco: Never  Vaping Use   Vaping status: Never Used  Substance and Sexual Activity   Alcohol use: No   Drug use: Never   Sexual activity: Not Currently    Birth control/protection: Pill  Other Topics Concern   Not on file  Social History Narrative   Not on file   Social Determinants of Health   Financial Resource Strain: Low Risk  (05/15/2022)   Overall Financial Resource Strain (CARDIA)    Difficulty of Paying Living Expenses: Not hard at all  Food Insecurity: No Food Insecurity (05/15/2022)   Hunger Vital Sign     Worried About Running Out of Food in the Last Year: Never true    Ran Out of Food in the Last Year: Never true  Transportation Needs: No Transportation Needs (05/15/2022)   PRAPARE - Administrator, Civil Service (Medical): No    Lack of Transportation (Non-Medical): No  Physical Activity: Insufficiently Active (05/15/2022)   Exercise Vital Sign    Days of Exercise per Week: 3 days    Minutes of Exercise per Session: 30 min  Stress: Stress Concern Present (05/15/2022)   [employer] of Occupational Health - Occupational Stress Questionnaire    Feeling of Stress : To some extent  Social Connections: Moderately Isolated (05/15/2022)   Social Connection and Isolation Panel [NHANES]    Frequency of Communication with Friends and Family: More than three times a week    Frequency of Social Gatherings with Friends and Family: More than three times a week    Attends Religious Services: More than 4 times per year    Active Member of Clubs or Organizations: No    Attends Banker Meetings: Never    Marital Status: Never married   Past Surgical History:  Procedure Laterality Date   CHOLECYSTECTOMY N/A 02/08/2022   Procedure: LAPAROSCOPIC CHOLECYSTECTOMY;  Surgeon: Lewie Chamber, DO;  Location: [facility];  Service: General;  Laterality: N/A;   ESOPHAGOGASTRODUODENOSCOPY ENDOSCOPY     IR GENERIC HISTORICAL  02/07/2016   IR RADIOLOGIST EVAL & MGMT 02/07/2016 Simonne Come, MD [facility] INTER[facility] RAD   WISDOM TOOTH EXTRACTION     Past Medical History:  Diagnosis Date   Anemia    Dysmenorrhea 12/20/2015   Eczema    mild   Fibroids 12/20/2015   Fibromyalgia    GERD (gastroesophageal reflux disease)    Hypertension    Menorrhagia with regular cycle 12/20/2015   PONV (postoperative nausea and vomiting)    Rash    Reflux    Rheumatoid arthritis (HCC)    Rhinitis    chonic   Vitamin D deficiency    Vulvar itching 12/20/2015   BP (!) 137/92   Pulse 84   Ht 5'  5" (1.651 m)   Wt 217 lb (98.4 kg)   SpO2 98%   BMI 36.11 kg/m   Opioid Risk Score:   Fall Risk Score:  `1  Depression screen PHQ 2/9     05/03/2023    1:11 PM 03/28/2023    9:39 AM 09/26/2022    9:39 AM 05/15/2022    1:51 PM 02/13/2021    9:58 AM 01/02/2021   11:22 AM 08/08/2020    1:11 PM  Depression screen PHQ 2/9  Decreased Interest  3 1 1 1 1 1  0  Down, Depressed, Hopeless 2 1 1 1 1  0 1  PHQ - 2 Score 5 2 2 2 2 1 1   Altered sleeping 2 2 1 3 2 2    Tired, decreased energy 3 2 0 3 2 3    Change in appetite 0 0 0 0 0 0   Feeling bad or failure about yourself  1 0 1 1 1  0   Trouble concentrating 0 0 0 1 1 0   Moving slowly or fidgety/restless 0 0 0 0 0 0   Suicidal thoughts 0 0 0 0 0    PHQ-9 Score 11 6 4 10 8 6    Difficult doing work/chores Somewhat difficult Somewhat difficult Somewhat difficult  Somewhat difficult       Review of Systems  Musculoskeletal:  Positive for back pain and neck pain.       Bilateral shin pain Buttocks pain Bilateral shoulder pain  All other systems reviewed and are negative.     Objective:   Physical Exam   Tender throught everyspot Bending back hurts  Neck and lower back SLR back pain Senation an sternght ok  Spurlings negative      Assessment & Plan:   Continue cymbalta   Continue PT- aquatic therapy TENS unit order

## 2023-05-03 NOTE — Progress Notes (Incomplete)
Subjective:    Patient ID: Karen Burton, female    DOB: 06-20-1976, 47 y.o.   MRN: [id number]  HPI  HPI  Karen Burton is a 47 y.o. year old female  who  has a past medical history of Anemia, Dysmenorrhea (12/20/2015), Eczema, Fibroids (12/20/2015), Fibromyalgia, GERD (gastroesophageal reflux disease), Hypertension, Menorrhagia with regular cycle (12/20/2015), PONV (postoperative nausea and vomiting), Rash, Reflux, Rheumatoid arthritis (HCC), Rhinitis, Vitamin D deficiency, and Vulvar itching (12/20/2015).   They are presenting to PM&R clinic as a new patient for pain management evaluation. They were referred by Dr. Adriana Simas for treatment of fibromyalgia pain.  She has a history of RA and reports this was diagnosed 17 years ago.  Initially her primary location of pain was her lower back.  She later developed pain in her hips and buttocks bilaterally.  Pain in her hips is throbbing in quality.  Over time she feels as the pain has spread to other locations including her arms and legs.  Her neck and shoulders are often very painful.  She feels like her entire body is stressed and this is contributing to her pain.  She denies shooting pain down her arms or legs.  Patient reports he is generally sensitive to medications and they will often make her sleepy.  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  She also helps to take care of her mother.  After doing a lot of activity she often has to lay in bed for a few days to recover.  She reports poor sleep, sometimes takes Ambien to help this.  Reports frequent constipation with occasional diarrhea.  Reports history of IBS  Decreased mood at times.  Denies SI or HI.  She reports she has a lot of stress because she feels like the pain could prevent her from being able to care for her mother and could limit her from working.   Red flag symptoms: No red flags for back pain endorsed in Hx or ROS  Medications  tried: Topical medications- Aleve roll on  Nsaids Ibuprofen- limited by liver enzyme elevation, meloxicam- didn't help much  Tylenol  Helps a little  Opiates  Tramadol- didn't help , used this for short time, helped her sleep  Gabapentin- tried it one time  TCAs  denies  SNRIs  Cymbalta- has been on it for a while Robaxin- didn't help   Other treatments: PT - March 2024, restarted Aquatic therapy TENs unit  has not tried it before  Injections- SI joint injections- didn't help , Trigger point injections- didn't help  Dry needling didn't help   Surgery Denies joint/back surgeries   Goals for pain control: ***  Prior UDS results: No results found for: "LABOPIA", "COCAINSCRNUR", "LABBENZ", "AMPHETMU", "THCU", "LABBARB"    Pain Inventory Average Pain 7 Pain Right Now 6 My pain is constant, sharp, dull, and aching  In the last 24 hours, has pain interfered with the following? General activity 7 Relation with others 8 Enjoyment of life 9 What TIME of day is your pain at its worst? morning , evening, and night Sleep (in general) Poor  Pain is worse with: walking, bending, sitting, inactivity, and standing Pain improves with:  . Relief from Meds:  .  walk without assistance  employed # of hrs/week 36+  No problems in this area  New pt  New pt    Family History  Problem Relation Age of Onset  . Diabetes Mother   .  Hypertension Mother   . Asthma Mother   . Cancer Mother        breast  . Thyroid disease Mother   . Arthritis Mother   . Graves' disease Mother   . Cancer Maternal Grandmother        pancreatic  . Fibroids Sister   . COPD Maternal Aunt   . Asthma Maternal Aunt   . Gout Maternal Aunt    Social History   Socioeconomic History  . Marital status: Single    Spouse name: Not on file  . Number of children: 0  . Years of education: Not on file  . Highest education level: Not on file  Occupational History  . Not on file  Tobacco Use  . Smoking  status: Never    Passive exposure: Never  . Smokeless tobacco: Never  Vaping Use  . Vaping status: Never Used  Substance and Sexual Activity  . Alcohol use: No  . Drug use: Never  . Sexual activity: Not Currently    Birth control/protection: Pill  Other Topics Concern  . Not on file  Social History Narrative  . Not on file   Social Determinants of Health   Financial Resource Strain: Low Risk  (05/15/2022)   Overall Financial Resource Strain (CARDIA)   . Difficulty of Paying Living Expenses: Not hard at all  Food Insecurity: No Food Insecurity (05/15/2022)   Hunger Vital Sign   . Worried About Programme researcher, broadcasting/film/video in the Last Year: Never true   . Ran Out of Food in the Last Year: Never true  Transportation Needs: No Transportation Needs (05/15/2022)   PRAPARE - Transportation   . Lack of Transportation (Medical): No   . Lack of Transportation (Non-Medical): No  Physical Activity: Insufficiently Active (05/15/2022)   Exercise Vital Sign   . Days of Exercise per Week: 3 days   . Minutes of Exercise per Session: 30 min  Stress: Stress Concern Present (05/15/2022)   [employer] of Occupational Health - Occupational Stress Questionnaire   . Feeling of Stress : To some extent  Social Connections: Moderately Isolated (05/15/2022)   Social Connection and Isolation Panel [NHANES]   . Frequency of Communication with Friends and Family: More than three times a week   . Frequency of Social Gatherings with Friends and Family: More than three times a week   . Attends Religious Services: More than 4 times per year   . Active Member of Clubs or Organizations: No   . Attends Banker Meetings: Never   . Marital Status: Never married   Past Surgical History:  Procedure Laterality Date  . CHOLECYSTECTOMY N/A 02/08/2022   Procedure: LAPAROSCOPIC CHOLECYSTECTOMY;  Surgeon: Lewie Chamber, DO;  Location: [facility];  Service: General;  Laterality: N/A;  .  ESOPHAGOGASTRODUODENOSCOPY ENDOSCOPY    . IR GENERIC HISTORICAL  02/07/2016   IR RADIOLOGIST EVAL & MGMT 02/07/2016 Simonne Come, MD [facility] INTERV RAD  . WISDOM TOOTH EXTRACTION     Past Medical History:  Diagnosis Date  . Anemia   . Dysmenorrhea 12/20/2015  . Eczema    mild  . Fibroids 12/20/2015  . Fibromyalgia   . GERD (gastroesophageal reflux disease)   . Hypertension   . Menorrhagia with regular cycle 12/20/2015  . PONV (postoperative nausea and vomiting)   . Rash   . Reflux   . Rheumatoid arthritis (HCC)   . Rhinitis    chonic  . Vitamin D deficiency   . Vulvar  itching 12/20/2015   BP (!) 137/92   Pulse 84   Ht 5\' 5"  (1.651 m)   Wt 217 lb (98.4 kg)   SpO2 98%   BMI 36.11 kg/m   Opioid Risk Score:   Fall Risk Score:  `1  Depression screen PHQ 2/9     05/03/2023    1:11 PM 03/28/2023    9:39 AM 09/26/2022    9:39 AM 05/15/2022    1:51 PM 02/13/2021    9:58 AM 01/02/2021   11:22 AM 08/08/2020    1:11 PM  Depression screen PHQ 2/9  Decreased Interest 3 1 1 1 1 1  0  Down, Depressed, Hopeless 2 1 1 1 1  0 1  PHQ - 2 Score 5 2 2 2 2 1 1   Altered sleeping 2 2 1 3 2 2    Tired, decreased energy 3 2 0 3 2 3    Change in appetite 0 0 0 0 0 0   Feeling bad or failure about yourself  1 0 1 1 1  0   Trouble concentrating 0 0 0 1 1 0   Moving slowly or fidgety/restless 0 0 0 0 0 0   Suicidal thoughts 0 0 0 0 0    PHQ-9 Score 11 6 4 10 8 6    Difficult doing work/chores Somewhat difficult Somewhat difficult Somewhat difficult  Somewhat difficult       Review of Systems  Musculoskeletal:  Positive for back pain and neck pain.       Bilateral shin pain Buttocks pain Bilateral shoulder pain  All other systems reviewed and are negative.     Objective:   Physical Exam   Gen: no distress, normal appearing HEENT: oral mucosa pink and moist, NCAT Cardio: Reg rate Chest: normal effort, normal rate of breathing Abd: soft, non-distended Ext: no edema Psych: pleasant, became  tearful at times discussing her mood and pain Skin: intact Neuro: Awake, follows commands, cranial nerves II through XII grossly intact, normal speech and language Strength 5 out of 5 in all 4 extremities overall although sometimes pain limited Sensory exam normal for light touch and pain in all 4 limbs. No limb ataxia or cerebellar signs. No abnormal tone appreciated.   Musculoskeletal:  Tender to palpation throughout her C-spine, L-spine Tender to palpation throughout bilateral upper and lower extremities including shoulders, elbows, wrists, hips, knees, ankles Tender to palpation periscapular muscles SLR negative but resulted in back pain Spurling's negative Pain with lumbar spinal extension Facet loading equivocal       Assessment & Plan:   1) Fibromyalgia 2) Rheumatoid arthritis  Continue cymbalta   Continue PT- aquatic therapy TENS unit order

## 2023-05-04 ENCOUNTER — Encounter: Payer: Self-pay | Admitting: Physical Medicine & Rehabilitation

## 2023-05-06 ENCOUNTER — Other Ambulatory Visit: Payer: Self-pay

## 2023-05-06 ENCOUNTER — Encounter (HOSPITAL_BASED_OUTPATIENT_CLINIC_OR_DEPARTMENT_OTHER): Payer: Self-pay | Admitting: Physical Therapy

## 2023-05-06 ENCOUNTER — Ambulatory Visit (HOSPITAL_BASED_OUTPATIENT_CLINIC_OR_DEPARTMENT_OTHER): Payer: Commercial Managed Care - PPO | Admitting: Physical Therapy

## 2023-05-06 DIAGNOSIS — M545 Low back pain, unspecified: Secondary | ICD-10-CM | POA: Diagnosis not present

## 2023-05-06 DIAGNOSIS — M5459 Other low back pain: Secondary | ICD-10-CM | POA: Diagnosis not present

## 2023-05-06 DIAGNOSIS — M797 Fibromyalgia: Secondary | ICD-10-CM | POA: Diagnosis not present

## 2023-05-06 DIAGNOSIS — M6281 Muscle weakness (generalized): Secondary | ICD-10-CM | POA: Diagnosis not present

## 2023-05-06 MED ORDER — ENBREL MINI 50 MG/ML ~~LOC~~ SOCT
50.0000 mg | SUBCUTANEOUS | 0 refills | Status: DC
  Filled 2023-05-06: qty 4, 28d supply, fill #0
  Filled 2023-05-28: qty 4, 28d supply, fill #1
  Filled 2023-07-01: qty 4, 28d supply, fill #2

## 2023-05-06 NOTE — Therapy (Signed)
OUTPATIENT PHYSICAL THERAPY THORACOLUMBAR TREATMENT   Patient Name: Karen Burton MRN: [id number] DOB:06-15-1976, 47 y.o., female Today's Date: 05/06/2023  END OF SESSION:  PT End of Session - 05/06/23 0859     Visit Number 3    Number of Visits 16    Date for PT Re-Evaluation 11/28/22    Authorization Type [facility] [employer] P[employer]    PT Start Time [id number]    PT Stop Time 0940    PT Time Calculation (min) 43 min    Behavior During Therapy [facility] for tasks assessed/performed              Past Medical History:  Diagnosis Date   Anemia    Dysmenorrhea 12/20/2015   Eczema    mild   Fibroids 12/20/2015   Fibromyalgia    GERD (gastroesophageal reflux disease)    Hypertension    Menorrhagia with regular cycle 12/20/2015   PONV (postoperative nausea and vomiting)    Rash    Reflux    Rheumatoid arthritis (HCC)    Rhinitis    chonic   Vitamin D deficiency    Vulvar itching 12/20/2015   Past Surgical History:  Procedure Laterality Date   CHOLECYSTECTOMY N/A 02/08/2022   Procedure: LAPAROSCOPIC CHOLECYSTECTOMY;  Surgeon: Lewie Chamber, DO;  Location: [facility];  Service: General;  Laterality: N/A;   ESOPHAGOGASTRODUODENOSCOPY ENDOSCOPY     IR GENERIC HISTORICAL  02/07/2016   IR RADIOLOGIST EVAL & MGMT 02/07/2016 Simonne Come, MD [facility] INTERV RAD   WISDOM TOOTH EXTRACTION     Patient Active Problem List   Diagnosis Date Noted   Chronic bilateral low back pain 03/28/2023   Obesity (BMI 30-39.9) 03/28/2023   Healthcare maintenance 09/26/2022   Insomnia 09/06/2021   Encounter for surveillance of contraceptive pills 08/12/2017   Fibromyalgia 11/06/2016   Uterine leiomyoma 12/20/2015   Essential hypertension 01/12/2013   Rheumatoid arthritis (HCC) 01/12/2013    PCP: Tommie Sams, DO   REFERRING PROVIDER: Pollyann Savoy, MD   REFERRING DIAG:   Fibromyalgia  M51.36 (ICD-10-CM) - DDD (degenerative disc disease), lumbar    Rationale for Evaluation and  Treatment: Rehabilitation  THERAPY DIAG:  Other low back pain  Muscle weakness (generalized)  Fibromyalgia  ONSET DATE: Jan 2024  SUBJECTIVE:                                                                                                                                                                                           SUBJECTIVE STATEMENT: Pt reports that she was tired after last aquatic session, but no increased soreness.     From initial evaluation:  Pt injured her back in Jan when assisting her mother into a WC (bent over lifting her feet). Lifting job Banker) increases pain.  No meds help. May be fibromyalgia or RA.  Tried OPPT and it didn't help, made worse.  Sleep with heating pad.  Wakes 2-3x nightly.  Work 12 hour shifts.  Pain high afterward.  PERTINENT HISTORY:  seropositive rheumatoid arthritis, degenerative disc disease and fibromyalgia syndrome.  PAIN:  Are you having pain? Yes: NPRS scale: 5/10 Pain location:  lower back Pain description: throbbing and stabbing Aggravating factors: standing/leaning Relieving factors: sitting,   PRECAUTIONS: None  RED FLAGS: None   WEIGHT BEARING RESTRICTIONS: No  FALLS:  Has patient fallen in last 6 months? No  LIVING ENVIRONMENT: Lives with: lives with their family Lives in: House/apartment Stairs: No ramped entrance Has following equipment at home: Single point cane, Environmental consultant - 2 wheeled, Wheelchair (manual), and Ramped entry  OCCUPATION: nurse; works at [employer]   PLOF: Independent  PATIENT GOALS: pain relief, be able to sleep, stand > 15 minutes  NEXT MD VISIT: Nov  OBJECTIVE:   DIAGNOSTIC FINDINGS:  IMPRESSION: 1. Lumbar spondylosis and degenerative disc disease, causing mild displacement of the right L2 nerve in the lateral extraforaminal space at L2-3 and borderline right foraminal stenosis at L5-S1. 2. Large heterogeneous presacral structure believed to represent fibroid uterus, only  partially included on today's exam.  PATIENT SURVEYS:  FOTO Primary measure 39% with goal of 53% at 12th visit    COGNITION: Overall cognitive status: Within functional limits for tasks assessed     POSTURE: rounded shoulders, forward head, and increased lumbar lordosis   PALPATION: TTP lumbar paraspinal  LUMBAR ROM:   P!=pain R sided>L AROM eval  Flexion Full P! rising  Extension   Right lateral flexion Full P!  Left lateral flexion Full P!  Right rotation wfl  Left rotation wfl   (Blank rows = not tested)  LOWER EXTREMITY ROM:     wfl  LOWER EXTREMITY MMT:    MMT Right eval Left eval  Hip flexion 44.6 48.5  Hip extension    Hip abduction    Hip adduction 30.5 34.6  Hip internal rotation    Hip external rotation    Knee flexion    Knee extension 42.7 43.8  Ankle dorsiflexion    Ankle plantarflexion    Ankle inversion    Ankle eversion     (Blank rows = not tested)  LUMBAR SPECIAL TESTS:  Slump test: Negative  FUNCTIONAL TESTS:  5 times sit to stand: 14.69  Last episode which finished in May 2024: 5 times sit to stand: 25.11 sec hands push up on legs  2 minute walk test: 506 ft   GAIT: Distance walked: 500 ft Assistive device utilized: None Level of assistance: Complete Independence Comments: wfl, increased cadence  TODAY'S TREATMENT:  Pt seen for aquatic therapy today.  Treatment took place in water 3.5-4.75 ft in depth at the [facility] pool. Temp of water was 91.  Pt entered/exited the pool via stairs in step-through pattern independently with bilat rail. * unsupported - walking forward/ backwards, multiple laps (cues for neutral RLE vs abdcted), side stepping  * side stepping into a wide squat with arm addct/abdct with yellow hand floats * Farmer carry and marching backwards forwards with bilat yellow hand  floats at side * UE on wall:  LE swings into hip flex/ext (to toe touch) and abdct/ addct  x 10, each direction/each LE, 2 sets  * return to walking forward/ backward  * TrA set with solid noodle pull down x 10 * staggered stance with kick board row, 2 sets of 10 * STS at bench with blue step under feet in water x 5 * seated at bench - fig 4 piriformis glute stretch, 2 versions x 15s  * straddling yellow noodle and holding yellow hand floats: cycling for spine decompression ( in corner then 1 width of pool, improved balance on noodle)  Pt requires the buoyancy and hydrostatic pressure of water for support, and to offload joints by unweighting joint load by at least 50 % in navel deep water and by at least 75-80% in chest to neck deep water.  Viscosity of the water is needed for resistance of strengthening. Water current perturbations provides challenge to standing balance requiring increased core activation.     PATIENT EDUCATION:  Education details: intro to aquatic therapy  Person educated: Patient Education method: Explanation Education comprehension: verbalized understanding  HOME EXERCISE PROGRAM: [email or web address]   ASSESSMENT:  CLINICAL IMPRESSION: Pt able to progress to increased volume of exercise (compared to first session) without increase in symptoms.  Limited the ROM for leg swings to pt tolerance.   Pain in back remained 5/10 until suspended vertically on noodle, when pain was eliminated.  Will continue to progress as tolerance  Goals are ongoing.    From Initial evaluation:  Kerri-Ann Wildes is a 47 y.o. female with seropositive rheumatoid arthritis, degenerative disc disease, lumbar spondylolisthesis, foraminal stenosis  and fibromyalgia syndrome. She reports incident in Jan 2024 which caused injury to her back and she has in downward spiral since then.  She did have a trial of land based PT a few months ago which exacerbated pain.  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  She has high pain sensitivity which is consistent with dx's, she is pain limited with sleeping and with standing. It is unclear which dx is causing symptoms at this time but she feels it is the lumbar dysfunction. She does demonstrates strength deficits in hip abd and slowed 5x STS test scoring for age. She will benefit from skilled PT intervention with initiation in aquatic setting.  Will transition to land based as approp.  Due to high co-pay she has agreed to 6 visits with potential new pool access membership with indep completion of aquatic HEP  OBJECTIVE IMPAIRMENTS:  decreased activity tolerance, decreased ROM, impaired flexibility, postural dysfunction, and pain.   ACTIVITY LIMITATIONS: carrying, lifting, bending, sitting, standing, squatting, sleeping, stairs, transfers, bed mobility, reach over head, locomotion level, and caring for others   PARTICIPATION LIMITATIONS: meal prep, cleaning, laundry, driving, shopping, community activity, and occupation   PERSONAL FACTORS: Profession, 1-2 comorbidities: fibromyalgia, and RA  are also affecting patient's functional outcome.    REHAB  POTENTIAL: Fair   CLINICAL DECISION MAKING: Evolving/moderate complexity  EVALUATION COMPLEXITY: Moderate   GOALS: Goals reviewed with patient? Yes  SHORT TERM GOALS: Target date: 06/07/23  Pt will report decrease in Normal pain to <4/10 Baseline:7/10 Goal status: INITIAL  2.  Pt will tolerate full aquatic sessions consistently without increase in pain and with improving function to demonstrate good toleration and effectiveness of intervention.   Baseline:  Goal status: INITIAL  3.  Pt will report decreased frequency with waking at night due to pain  Baseline: 2-3 times nightly Goal status: INITIAL  4.  Pt to improve on Foto score to 45% to demonstrate improve perception of function. Baseline: 39 Goal status: INITIAL  5.  Pt will be indep with aquatic HEP and will  have access to pool. Baseline: Indep with land based HEP Goal status: INITIAL  6.  Pt will be able to stand > 20 minutes without increase in pain to improve toleration to job. Baseline: <15 Goal status: INITIAL  7. Pt will improve strength in bilat hip abd by 10lbs to demonstrate improved overall physical function   LONG TERM GOALS: To be set at re-cert as approp   PLAN:  PT FREQUENCY: 1-2x/week  PT DURATION: 6 weeks  PLANNED INTERVENTIONS: Therapeutic exercises, Therapeutic activity, Neuromuscular re-education, Balance training, Gait training, Patient/Family education, Self Care, Joint mobilization, Stair training, Orthotic/Fit training, DME instructions, Aquatic Therapy, Dry Needling, Electrical stimulation, Cryotherapy, Moist heat, Taping, Ionotophoresis 4mg /ml Dexamethasone, Manual therapy, and Re-evaluation.  PLAN FOR NEXT SESSION: Aquatic intervention: strengthening hips and core; improved posture; balance retraining. Aquatic HEP  Mayer Camel, [place] 05/06/23 9:48 AM [facility] [address] [place], [place], [address] Phone: [phone]   Fax:  [phone]

## 2023-05-06 NOTE — Progress Notes (Signed)
Refill received

## 2023-05-08 ENCOUNTER — Ambulatory Visit (HOSPITAL_BASED_OUTPATIENT_CLINIC_OR_DEPARTMENT_OTHER): Payer: Commercial Managed Care - PPO | Admitting: Physical Therapy

## 2023-05-08 ENCOUNTER — Encounter (HOSPITAL_BASED_OUTPATIENT_CLINIC_OR_DEPARTMENT_OTHER): Payer: Self-pay | Admitting: Physical Therapy

## 2023-05-08 DIAGNOSIS — M797 Fibromyalgia: Secondary | ICD-10-CM

## 2023-05-08 DIAGNOSIS — M545 Low back pain, unspecified: Secondary | ICD-10-CM | POA: Diagnosis not present

## 2023-05-08 DIAGNOSIS — M6281 Muscle weakness (generalized): Secondary | ICD-10-CM | POA: Diagnosis not present

## 2023-05-08 DIAGNOSIS — M5459 Other low back pain: Secondary | ICD-10-CM | POA: Diagnosis not present

## 2023-05-08 NOTE — Therapy (Signed)
OUTPATIENT PHYSICAL THERAPY THORACOLUMBAR TREATMENT   Patient Name: Karen Burton MRN: [id number] DOB:05-02-1976, 47 y.o., female Today's Date: 05/08/2023  END OF SESSION:  PT End of Session - 05/08/23 1124     Visit Number 4    Number of Visits 16    Date for PT Re-Evaluation 11/28/22    Authorization Type [facility] [employer] PPO    PT Start Time 1119    PT Stop Time 1158    PT Time Calculation (min) 39 min    Activity Tolerance Patient tolerated treatment well    Behavior During Therapy [facility] for tasks assessed/performed              Past Medical History:  Diagnosis Date   Anemia    Dysmenorrhea 12/20/2015   Eczema    mild   Fibroids 12/20/2015   Fibromyalgia    GERD (gastroesophageal reflux disease)    Hypertension    Menorrhagia with regular cycle 12/20/2015   PONV (postoperative nausea and vomiting)    Rash    Reflux    Rheumatoid arthritis (HCC)    Rhinitis    chonic   Vitamin D deficiency    Vulvar itching 12/20/2015   Past Surgical History:  Procedure Laterality Date   CHOLECYSTECTOMY N/A 02/08/2022   Procedure: LAPAROSCOPIC CHOLECYSTECTOMY;  Surgeon: Lewie Chamber, DO;  Location: [facility];  Service: General;  Laterality: N/A;   ESOPHAGOGASTRODUODENOSCOPY ENDOSCOPY     IR GENERIC HISTORICAL  02/07/2016   IR RADIOLOGIST EVAL & MGMT 02/07/2016 Simonne Come, MD [facility] INTERV RAD   WISDOM TOOTH EXTRACTION     Patient Active Problem List   Diagnosis Date Noted   Chronic bilateral low back pain 03/28/2023   Obesity (BMI 30-39.9) 03/28/2023   Healthcare maintenance 09/26/2022   Insomnia 09/06/2021   Encounter for surveillance of contraceptive pills 08/12/2017   Fibromyalgia 11/06/2016   Uterine leiomyoma 12/20/2015   Essential hypertension 01/12/2013   Rheumatoid arthritis (HCC) 01/12/2013    PCP: Tommie Sams, DO   REFERRING PROVIDER: Pollyann Savoy, MD   REFERRING DIAG:   Fibromyalgia  M51.36 (ICD-10-CM) - DDD (degenerative  disc disease), lumbar    Rationale for Evaluation and Treatment: Rehabilitation  THERAPY DIAG:  Other low back pain  Muscle weakness (generalized)  Fibromyalgia  ONSET DATE: Jan 2024  SUBJECTIVE:                                                                                                                                                                                           SUBJECTIVE STATEMENT: Pt reports that she was sore after last aquatic session, but that  has resolved.  She reports that this weekend she helped her mother in the morning (pushing hoyer, etc) and felt no pain during or after. However she took a nap afterwards and when she awoke, her back was painful and tight.  The pain and tightness has reduced some.    From initial evaluation:  Pt injured her back in Jan when assisting her mother into a WC (bent over lifting her feet). Lifting job Banker) increases pain.  No meds help. May be fibromyalgia or RA.  Tried OPPT and it didn't help, made worse.  Sleep with heating pad.  Wakes 2-3x nightly.  Work 12 hour shifts.  Pain high afterward.  PERTINENT HISTORY:  seropositive rheumatoid arthritis, degenerative disc disease and fibromyalgia syndrome.  PAIN:  Are you having pain? Yes: NPRS scale: 3-4/10 Pain location:  lower back Pain description: tight, ache Aggravating factors: standing/leaning Relieving factors: sitting,   PRECAUTIONS: None  RED FLAGS: None   WEIGHT BEARING RESTRICTIONS: No  FALLS:  Has patient fallen in last 6 months? No  LIVING ENVIRONMENT: Lives with: lives with their family Lives in: House/apartment Stairs: No ramped entrance Has following equipment at home: Single point cane, Environmental consultant - 2 wheeled, Wheelchair (manual), and Ramped entry  OCCUPATION: nurse; works at [employer]   PLOF: Independent  PATIENT GOALS: pain relief, be able to sleep, stand > 15 minutes  NEXT MD VISIT: Nov  OBJECTIVE:   DIAGNOSTIC FINDINGS:   IMPRESSION: 1. Lumbar spondylosis and degenerative disc disease, causing mild displacement of the right L2 nerve in the lateral extraforaminal space at L2-3 and borderline right foraminal stenosis at L5-S1. 2. Large heterogeneous presacral structure believed to represent fibroid uterus, only partially included on today's exam.  PATIENT SURVEYS:  FOTO Primary measure 39% with goal of 53% at 12th visit    COGNITION: Overall cognitive status: Within functional limits for tasks assessed     POSTURE: rounded shoulders, forward head, and increased lumbar lordosis   PALPATION: TTP lumbar paraspinal  LUMBAR ROM:   P!=pain R sided>L AROM eval  Flexion Full P! rising  Extension   Right lateral flexion Full P!  Left lateral flexion Full P!  Right rotation wfl  Left rotation wfl   (Blank rows = not tested)  LOWER EXTREMITY ROM:     wfl  LOWER EXTREMITY MMT:    MMT Right eval Left eval  Hip flexion 44.6 48.5  Hip extension    Hip abduction    Hip adduction 30.5 34.6  Hip internal rotation    Hip external rotation    Knee flexion    Knee extension 42.7 43.8  Ankle dorsiflexion    Ankle plantarflexion    Ankle inversion    Ankle eversion     (Blank rows = not tested)  LUMBAR SPECIAL TESTS:  Slump test: Negative  FUNCTIONAL TESTS:  5 times sit to stand: 14.69  Last episode which finished in May 2024: 5 times sit to stand: 25.11 sec hands push up on legs  2 minute walk test: 506 ft   GAIT: Distance walked: 500 ft Assistive device utilized: None Level of assistance: Complete Independence Comments: wfl, increased cadence  TODAY'S TREATMENT:  Pt seen for aquatic therapy today.  Treatment took place in water 3.5-4.75 ft in depth at the [facility] pool. Temp of water was 91.  Pt entered/exited the pool via stairs in step-through  pattern independently with bilat rail.  * unsupported - walking forward/ backwards with reciprocal arm swing, multiple laps (cues for increased knee / hip flexion in swing through) * side stepping  with arm addct/abdct with yellow hand floats x 3 laps * Farmer carry and marching backwards/ forwards with bilat yellow hand floats at side x 4 laps * UE on yellow hand floats:  3 way LE kick x 10 each LE * staggered stance with kick board row, 2 sets of 10; staggered stance with horiz abdct/ addct with yellow and floats x 10 each LE forward * bow and arrow with step back x 5 each; then moving backwards across pool  * straddling yellow noodle and holding yellow hand floats: cycling, suspended jumping jacks -> holding wall cross country ski  * TrA set with hollow noodle pull down to thighs x 10   Pt requires the buoyancy and hydrostatic pressure of water for support, and to offload joints by unweighting joint load by at least 50 % in navel deep water and by at least 75-80% in chest to neck deep water.  Viscosity of the water is needed for resistance of strengthening. Water current perturbations provides challenge to standing balance requiring increased core activation.  PATIENT EDUCATION:  Education details: intro to aquatic therapy  Person educated: Patient Education method: Explanation Education comprehension: verbalized understanding  HOME EXERCISE PROGRAM: [email or web address]  AQUATIC Access Code: [email or web address] (not issued yet)  ASSESSMENT:  CLINICAL IMPRESSION:  Pain in back remained 3-4/10 during session. Otherwise good tolerance for aquatic exercises today.  Pt able to balance well while vertically suspended on noodle in deeper water for longer period.   Will continue to progress as tolerance  Goals are ongoing.  Plan to continue discussion of plan for aquatics after d/c from pool (join a facility with pool?)   From Initial evaluation:  Karen Burton is a 47 y.o. female with seropositive  rheumatoid arthritis, degenerative disc disease, lumbar spondylolisthesis, foraminal stenosis  and fibromyalgia syndrome. She reports incident in Jan 2024 which caused injury to her back and she has in downward spiral since then.  She did have a trial of land based PT a few months ago which exacerbated pain.  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  She has high pain sensitivity which is consistent with dx's, she is pain limited with sleeping and with standing. It is unclear which dx is causing symptoms at this time but she feels it is the lumbar dysfunction. She does demonstrates strength deficits in hip abd and slowed 5x STS test scoring for age. She will benefit from skilled PT intervention with initiation in aquatic setting.  Will transition to land based as approp.  Due to high co-pay she has agreed to 6 visits with potential new pool access membership with indep completion of aquatic HEP  OBJECTIVE IMPAIRMENTS:  decreased activity tolerance, decreased ROM, impaired flexibility, postural dysfunction, and pain.   ACTIVITY LIMITATIONS: carrying, lifting, bending, sitting, standing, squatting, sleeping, stairs, transfers, bed mobility, reach over head, locomotion level, and caring for others   PARTICIPATION LIMITATIONS: meal prep, cleaning, laundry, driving, shopping, community activity, and occupation   PERSONAL FACTORS: Profession, 1-2 comorbidities: fibromyalgia, and RA  are also affecting patient's functional outcome.  REHAB POTENTIAL: Fair   CLINICAL DECISION MAKING: Evolving/moderate complexity  EVALUATION COMPLEXITY: Moderate   GOALS: Goals reviewed with patient? Yes  SHORT TERM GOALS: Target date: 06/07/23  Pt will report decrease in Normal pain to <4/10 Baseline:7/10 Goal status: INITIAL  2.  Pt will tolerate full aquatic sessions consistently without increase in pain and with improving function to demonstrate good toleration and effectiveness of  intervention.   Baseline:  Goal status: INITIAL  3.  Pt will report decreased frequency with waking at night due to pain  Baseline: 2-3 times nightly Goal status: INITIAL  4.  Pt to improve on Foto score to 45% to demonstrate improve perception of function. Baseline: 39 Goal status: INITIAL  5.  Pt will be indep with aquatic HEP and will have access to pool. Baseline: Indep with land based HEP Goal status: INITIAL  6.  Pt will be able to stand > 20 minutes without increase in pain to improve toleration to job. Baseline: <15 Goal status: INITIAL  7. Pt will improve strength in bilat hip abd by 10lbs to demonstrate improved overall physical function   LONG TERM GOALS: To be set at re-cert as approp   PLAN:  PT FREQUENCY: 1-2x/week  PT DURATION: 6 weeks  PLANNED INTERVENTIONS: Therapeutic exercises, Therapeutic activity, Neuromuscular re-education, Balance training, Gait training, Patient/Family education, Self Care, Joint mobilization, Stair training, Orthotic/Fit training, DME instructions, Aquatic Therapy, Dry Needling, Electrical stimulation, Cryotherapy, Moist heat, Taping, Ionotophoresis 4mg /ml Dexamethasone, Manual therapy, and Re-evaluation.  PLAN FOR NEXT SESSION: Aquatic intervention: strengthening hips and core; improved posture; balance retraining. Aquatic HEP  Mayer Camel, PTA 05/08/23 1:05 PM [facility] [address] [place], [place], [address] Phone: [phone]   Fax:  [phone]

## 2023-05-15 ENCOUNTER — Encounter (HOSPITAL_BASED_OUTPATIENT_CLINIC_OR_DEPARTMENT_OTHER): Payer: Self-pay | Admitting: Physical Therapy

## 2023-05-15 ENCOUNTER — Ambulatory Visit (HOSPITAL_BASED_OUTPATIENT_CLINIC_OR_DEPARTMENT_OTHER): Payer: Commercial Managed Care - PPO | Admitting: Physical Therapy

## 2023-05-15 DIAGNOSIS — M6281 Muscle weakness (generalized): Secondary | ICD-10-CM

## 2023-05-15 DIAGNOSIS — M545 Low back pain, unspecified: Secondary | ICD-10-CM | POA: Diagnosis not present

## 2023-05-15 DIAGNOSIS — M797 Fibromyalgia: Secondary | ICD-10-CM | POA: Diagnosis not present

## 2023-05-15 DIAGNOSIS — M5459 Other low back pain: Secondary | ICD-10-CM | POA: Diagnosis not present

## 2023-05-15 NOTE — Therapy (Addendum)
OUTPATIENT PHYSICAL THERAPY THORACOLUMBAR TREATMENT   Patient Name: Karen Burton MRN: [id number] DOB:10/11/1975, 47 y.o., female Today's Date: 05/15/2023  END OF SESSION:  PT End of Session - 05/15/23 1247     Visit Number 5    Number of Visits 16    Date for PT Re-Evaluation 11/28/22    Authorization Type [facility] [employer] PPO    PT Start Time 0901    PT Stop Time 0945    PT Time Calculation (min) 44 min    Activity Tolerance Patient tolerated treatment well    Behavior During Therapy [facility] for tasks assessed/performed               Past Medical History:  Diagnosis Date   Anemia    Dysmenorrhea 12/20/2015   Eczema    mild   Fibroids 12/20/2015   Fibromyalgia    GERD (gastroesophageal reflux disease)    Hypertension    Menorrhagia with regular cycle 12/20/2015   PONV (postoperative nausea and vomiting)    Rash    Reflux    Rheumatoid arthritis (HCC)    Rhinitis    chonic   Vitamin D deficiency    Vulvar itching 12/20/2015   Past Surgical History:  Procedure Laterality Date   CHOLECYSTECTOMY N/A 02/08/2022   Procedure: LAPAROSCOPIC CHOLECYSTECTOMY;  Surgeon: Lewie Chamber, DO;  Location: [facility];  Service: General;  Laterality: N/A;   ESOPHAGOGASTRODUODENOSCOPY ENDOSCOPY     IR GENERIC HISTORICAL  02/07/2016   IR RADIOLOGIST EVAL & MGMT 02/07/2016 Simonne Come, MD [facility] INTERV RAD   WISDOM TOOTH EXTRACTION     Patient Active Problem List   Diagnosis Date Noted   Chronic bilateral low back pain 03/28/2023   Obesity (BMI 30-39.9) 03/28/2023   Healthcare maintenance 09/26/2022   Insomnia 09/06/2021   Encounter for surveillance of contraceptive pills 08/12/2017   Fibromyalgia 11/06/2016   Uterine leiomyoma 12/20/2015   Essential hypertension 01/12/2013   Rheumatoid arthritis (HCC) 01/12/2013    PCP: Tommie Sams, DO   REFERRING PROVIDER: Pollyann Savoy, MD   REFERRING DIAG:   Fibromyalgia  M51.36 (ICD-10-CM) - DDD (degenerative  disc disease), lumbar    Rationale for Evaluation and Treatment: Rehabilitation  THERAPY DIAG:  Other low back pain  Muscle weakness (generalized)  Fibromyalgia  Low back pain, unspecified back pain laterality, unspecified chronicity, unspecified whether sciatica present  ONSET DATE: Jan 2024  SUBJECTIVE:  SUBJECTIVE STATEMENT: Pt reports reduction in max pain frequency in past 2 weeks.  She is avoiding scheduling 3 days of work in a row to improve her toleration.    From initial evaluation:  Pt injured her back in Jan when assisting her mother into a WC (bent over lifting her feet). Lifting job Banker) increases pain.  No meds help. May be fibromyalgia or RA.  Tried OPPT and it didn't help, made worse.  Sleep with heating pad.  Wakes 2-3x nightly.  Work 12 hour shifts.  Pain high afterward.  PERTINENT HISTORY:  seropositive rheumatoid arthritis, degenerative disc disease and fibromyalgia syndrome.  PAIN:  Are you having pain? Yes: NPRS scale: 3-4/10 Pain location:  lower back Pain description: tight, ache Aggravating factors: standing/leaning Relieving factors: sitting,   PRECAUTIONS: None  RED FLAGS: None   WEIGHT BEARING RESTRICTIONS: No  FALLS:  Has patient fallen in last 6 months? No  LIVING ENVIRONMENT: Lives with: lives with their family Lives in: House/apartment Stairs: No ramped entrance Has following equipment at home: Single point cane, Environmental consultant - 2 wheeled, Wheelchair (manual), and Ramped entry  OCCUPATION: nurse; works at [employer]   PLOF: Independent  PATIENT GOALS: pain relief, be able to sleep, stand > 15 minutes  NEXT MD VISIT: Nov  OBJECTIVE:   DIAGNOSTIC FINDINGS:  IMPRESSION: 1. Lumbar spondylosis and degenerative disc disease, causing  mild displacement of the right L2 nerve in the lateral extraforaminal space at L2-3 and borderline right foraminal stenosis at L5-S1. 2. Large heterogeneous presacral structure believed to represent fibroid uterus, only partially included on today's exam.  PATIENT SURVEYS:  FOTO Primary measure 39% with goal of 53% at 12th visit    COGNITION: Overall cognitive status: Within functional limits for tasks assessed     POSTURE: rounded shoulders, forward head, and increased lumbar lordosis   PALPATION: TTP lumbar paraspinal  LUMBAR ROM:   P!=pain R sided>L AROM eval  Flexion Full P! rising  Extension   Right lateral flexion Full P!  Left lateral flexion Full P!  Right rotation wfl  Left rotation wfl   (Blank rows = not tested)  LOWER EXTREMITY ROM:     wfl  LOWER EXTREMITY MMT:    MMT Right eval Left eval  Hip flexion 44.6 48.5  Hip extension    Hip abduction    Hip adduction 30.5 34.6  Hip internal rotation    Hip external rotation    Knee flexion    Knee extension 42.7 43.8  Ankle dorsiflexion    Ankle plantarflexion    Ankle inversion    Ankle eversion     (Blank rows = not tested)  LUMBAR SPECIAL TESTS:  Slump test: Negative  FUNCTIONAL TESTS:  5 times sit to stand: 14.69  Last episode which finished in May 2024: 5 times sit to stand: 25.11 sec hands push up on legs  2 minute walk test: 506 ft   GAIT: Distance walked: 500 ft Assistive device utilized: None Level of assistance: Complete Independence Comments: wfl, increased cadence  TODAY'S TREATMENT:  Pt seen for aquatic therapy today.  Treatment took place in water 3.5-4.75 ft in depth at the [facility] pool. Temp of water was 91.  Pt entered/exited the pool via stairs in step-through pattern independently with bilat rail.  * unsupported - walking forward/  backwards with reciprocal arm swing, multiple laps (cues for increased knee / hip flexion in swing through) * side stepping  with arm addct/abdct with yellow hand floats x 3 laps * Farmer carry and marching backwards/ forwards with bilat yellow hand floats at side x 4 laps ->unilaterally  forward and back x 1 width * bow and arrow with step back x 10 each; * UE on yellow hand floats:  3 way LE kick x 10 each LE * TrA set with hollow noodle pull down to thighs x 10 wide stance then staggered * straddling yellow noodle and holding yellow hand floats: cycling x 4 widths, suspended jumping jacks -> holding wall cross country ski  *KTC vertically suspended noodle wrapped posteriorly * staggered stance with kick board row, 1 sets of 15; staggered stance with horiz abdct *L stretch x 3      Pt requires the buoyancy and hydrostatic pressure of water for support, and to offload joints by unweighting joint load by at least 50 % in navel deep water and by at least 75-80% in chest to neck deep water.  Viscosity of the water is needed for resistance of strengthening. Water current perturbations provides challenge to standing balance requiring increased core activation.  PATIENT EDUCATION:  Education details: intro to aquatic therapy  Person educated: Patient Education method: Explanation Education comprehension: verbalized understanding  HOME EXERCISE PROGRAM: Access Code: [email or web address] URL: [email or web address] Date: 05/22/2023 Prepared by: Geni Bers  Exercises - Hand Buoy Carry  - Side Stepping with Hand Floats  - Drawing Bow  - 1 x daily - 1-3 x weekly - 1-3 sets - 10 reps - Standing 3-Way Leg Reach  - 1 x daily - 1-3 x weekly - 1-2 sets - 10 reps - Noodle press  - 1 x daily - 1-3 x weekly - 1-3 sets - 10 reps - Kick Board Row  - 1 x daily - 1-3 x weekly - 1-3 sets - 10 reps - Standing 'L' Stretch at [employer]  - 1 x daily - 1-3 x weekly - 1 sets - 3 reps - 10s hold - Seated  Straddle on Flotation Forward Breast Stroke Arms and Bicycle Legs  - Sit to Stand  - 1 x daily - 1-3 x weekly - 1 sets - 10 reps - Standing Hip Abduction Adduction at Pool Wall  - 1 x daily - 1-3 x weekly - 1-3 sets - 10 reps - Standing Hip Flexion Extension at [employer]  - 1 x daily - 1-3 x weekly - 1-3 sets - 10 reps Not issued  ASSESSMENT:  CLINICAL IMPRESSION:  Pt reports decreased max pain frequency, improved toleration to standing/working and improved overall endurance since onset of therapy. Waking ~ 1 x nightly due to pain and usual pain decreased to 3-4/10 meeting multiple STG's. Discussed pool access going forward at [facility] [place]. Indep with sitting balance on noodle. Good tolerance to therapy. Will plan to issue aquatic HEP next session so she can complete x 1 indep and return for any re-instruction or clarifications. Goals ongoing     From Initial evaluation:  Takya Boswell is a 47 y.o. female with seropositive rheumatoid arthritis, degenerative disc disease, lumbar spondylolisthesis,  foraminal stenosis  and fibromyalgia syndrome. She reports incident in Jan 2024 which caused injury to her back and she has in downward spiral since then.  She did have a trial of land based PT a few months ago which exacerbated pain.  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  She has high pain sensitivity which is consistent with dx's, she is pain limited with sleeping and with standing. It is unclear which dx is causing symptoms at this time but she feels it is the lumbar dysfunction. She does demonstrates strength deficits in hip abd and slowed 5x STS test scoring for age. She will benefit from skilled PT intervention with initiation in aquatic setting.  Will transition to land based as approp.  Due to high co-pay she has agreed to 6 visits with potential new pool access membership with indep completion of aquatic HEP  OBJECTIVE IMPAIRMENTS:  decreased  activity tolerance, decreased ROM, impaired flexibility, postural dysfunction, and pain.   ACTIVITY LIMITATIONS: carrying, lifting, bending, sitting, standing, squatting, sleeping, stairs, transfers, bed mobility, reach over head, locomotion level, and caring for others   PARTICIPATION LIMITATIONS: meal prep, cleaning, laundry, driving, shopping, community activity, and occupation   PERSONAL FACTORS: Profession, 1-2 comorbidities: fibromyalgia, and RA  are also affecting patient's functional outcome.    REHAB POTENTIAL: Fair   CLINICAL DECISION MAKING: Evolving/moderate complexity  EVALUATION COMPLEXITY: Moderate   GOALS: Goals reviewed with patient? Yes  SHORT TERM GOALS: Target date: 06/07/23  Pt will report decrease in Normal pain to <4/10 Baseline:7/10 Goal status: MET 05/15/23  2.  Pt will tolerate full aquatic sessions consistently without increase in pain and with improving function to demonstrate good toleration and effectiveness of intervention.   Baseline:  Goal status: Met 05/15/23  3.  Pt will report decreased frequency with waking at night due to pain  Baseline: 2-3 times nightly Goal status:Met 05/15/23  4.  Pt to improve on Foto score to 45% to demonstrate improve perception of function. Baseline: 39 Goal status: INITIAL  5.  Pt will be indep with aquatic HEP and will have access to pool. Baseline: Indep with land based HEP Goal status: INITIAL  6.  Pt will be able to stand > 20 minutes without increase in pain to improve toleration to job. Baseline: <15 Goal status: INITIAL  7. Pt will improve strength in bilat hip abd by 10lbs to demonstrate improved overall physical function   LONG TERM GOALS: To be set at re-cert as approp   PLAN:  PT FREQUENCY: 1-2x/week  PT DURATION: 6 weeks  PLANNED INTERVENTIONS: Therapeutic exercises, Therapeutic activity, Neuromuscular re-education, Balance training, Gait training, Patient/Family education, Self Care,  Joint mobilization, Stair training, Orthotic/Fit training, DME instructions, Aquatic Therapy, Dry Needling, Electrical stimulation, Cryotherapy, Moist heat, Taping, Ionotophoresis 4mg /ml Dexamethasone, Manual therapy, and Re-evaluation.  PLAN FOR NEXT SESSION: Aquatic intervention: strengthening hips and core; improved posture; balance retraining. Aquatic HEP  Corrie Dandy [place]) Keiran Sias MPT 05/15/23 12:48 PM [facility] [address] [place], [place], [address] Phone: [phone]   Fax:  [phone]  Addend Rushie Chestnut) Raeanna Soberanes MPT 05/22/23 241p

## 2023-05-23 ENCOUNTER — Encounter (HOSPITAL_BASED_OUTPATIENT_CLINIC_OR_DEPARTMENT_OTHER): Payer: Self-pay | Admitting: Physical Therapy

## 2023-05-23 ENCOUNTER — Ambulatory Visit (HOSPITAL_BASED_OUTPATIENT_CLINIC_OR_DEPARTMENT_OTHER): Payer: Commercial Managed Care - PPO | Admitting: Physical Therapy

## 2023-05-23 DIAGNOSIS — M797 Fibromyalgia: Secondary | ICD-10-CM | POA: Diagnosis not present

## 2023-05-23 DIAGNOSIS — M5459 Other low back pain: Secondary | ICD-10-CM

## 2023-05-23 DIAGNOSIS — M545 Low back pain, unspecified: Secondary | ICD-10-CM | POA: Diagnosis not present

## 2023-05-23 DIAGNOSIS — M6281 Muscle weakness (generalized): Secondary | ICD-10-CM | POA: Diagnosis not present

## 2023-05-23 NOTE — Therapy (Signed)
OUTPATIENT PHYSICAL THERAPY THORACOLUMBAR TREATMENT   Patient Name: Karen Burton MRN: [id number] DOB:07/24/1976, 47 y.o., female Today's Date: 05/23/2023  END OF SESSION:  PT End of Session - 05/23/23 0910     Visit Number 6    Number of Visits 16    Date for PT Re-Evaluation 06/07/23    Authorization Type [facility] [employer] PPO    PT Start Time [phone]    PT Stop Time 0944    PT Time Calculation (min) 41 min    Activity Tolerance Patient tolerated treatment well    Behavior During Therapy [facility] for tasks assessed/performed               Past Medical History:  Diagnosis Date   Anemia    Dysmenorrhea 12/20/2015   Eczema    mild   Fibroids 12/20/2015   Fibromyalgia    GERD (gastroesophageal reflux disease)    Hypertension    Menorrhagia with regular cycle 12/20/2015   PONV (postoperative nausea and vomiting)    Rash    Reflux    Rheumatoid arthritis (HCC)    Rhinitis    chonic   Vitamin D deficiency    Vulvar itching 12/20/2015   Past Surgical History:  Procedure Laterality Date   CHOLECYSTECTOMY N/A 02/08/2022   Procedure: LAPAROSCOPIC CHOLECYSTECTOMY;  Surgeon: Lewie Chamber, DO;  Location: [facility];  Service: General;  Laterality: N/A;   ESOPHAGOGASTRODUODENOSCOPY ENDOSCOPY     IR GENERIC HISTORICAL  02/07/2016   IR RADIOLOGIST EVAL & MGMT 02/07/2016 Simonne Come, MD [facility] INTERV RAD   WISDOM TOOTH EXTRACTION     Patient Active Problem List   Diagnosis Date Noted   Chronic bilateral low back pain 03/28/2023   Obesity (BMI 30-39.9) 03/28/2023   Healthcare maintenance 09/26/2022   Insomnia 09/06/2021   Encounter for surveillance of contraceptive pills 08/12/2017   Fibromyalgia 11/06/2016   Uterine leiomyoma 12/20/2015   Essential hypertension 01/12/2013   Rheumatoid arthritis (HCC) 01/12/2013    PCP: Tommie Sams, DO   REFERRING PROVIDER: Pollyann Savoy, MD   REFERRING DIAG:   Fibromyalgia  M51.36 (ICD-10-CM) - DDD (degenerative  disc disease), lumbar    Rationale for Evaluation and Treatment: Rehabilitation  THERAPY DIAG:  Other low back pain  Muscle weakness (generalized)  Fibromyalgia  ONSET DATE: Jan 2024  SUBJECTIVE:                                                                                                                                                                                           SUBJECTIVE STATEMENT: Pt reports she plans to go to [facility]; can attend  open swim for $6 instead of getting membership.      From initial evaluation:  Pt injured her back in Jan when assisting her mother into a WC (bent over lifting her feet). Lifting job Banker) increases pain.  No meds help. May be fibromyalgia or RA.  Tried OPPT and it didn't help, made worse.  Sleep with heating pad.  Wakes 2-3x nightly.  Work 12 hour shifts.  Pain high afterward.  PERTINENT HISTORY:  seropositive rheumatoid arthritis, degenerative disc disease and fibromyalgia syndrome.  PAIN:  Are you having pain? Yes: NPRS scale: 3-4/10 Pain location:  hips Pain description: tight, ache Aggravating factors: standing/leaning Relieving factors: sitting,   PRECAUTIONS: None  RED FLAGS: None   WEIGHT BEARING RESTRICTIONS: No  FALLS:  Has patient fallen in last 6 months? No  LIVING ENVIRONMENT: Lives with: lives with their family Lives in: House/apartment Stairs: No ramped entrance Has following equipment at home: Single point cane, Environmental consultant - 2 wheeled, Wheelchair (manual), and Ramped entry  OCCUPATION: nurse; works at [employer]   PLOF: Independent  PATIENT GOALS: pain relief, be able to sleep, stand > 15 minutes  NEXT MD VISIT: Nov  OBJECTIVE:   DIAGNOSTIC FINDINGS:  IMPRESSION: 1. Lumbar spondylosis and degenerative disc disease, causing mild displacement of the right L2 nerve in the lateral extraforaminal space at L2-3 and borderline right foraminal stenosis at L5-S1. 2. Large heterogeneous  presacral structure believed to represent fibroid uterus, only partially included on today's exam.  PATIENT SURVEYS:  FOTO Primary measure 39% with goal of 53% at 12th visit    COGNITION: Overall cognitive status: Within functional limits for tasks assessed     POSTURE: rounded shoulders, forward head, and increased lumbar lordosis   PALPATION: TTP lumbar paraspinal  LUMBAR ROM:   P!=pain R sided>L AROM eval  Flexion Full P! rising  Extension   Right lateral flexion Full P!  Left lateral flexion Full P!  Right rotation wfl  Left rotation wfl   (Blank rows = not tested)  LOWER EXTREMITY ROM:     wfl  LOWER EXTREMITY MMT:    MMT Right eval Left eval  Hip flexion 44.6 48.5  Hip extension    Hip abduction    Hip adduction 30.5 34.6  Hip internal rotation    Hip external rotation    Knee flexion    Knee extension 42.7 43.8  Ankle dorsiflexion    Ankle plantarflexion    Ankle inversion    Ankle eversion     (Blank rows = not tested)  LUMBAR SPECIAL TESTS:  Slump test: Negative  FUNCTIONAL TESTS:  5 times sit to stand: 14.69  Last episode which finished in May 2024: 5 times sit to stand: 25.11 sec hands push up on legs  2 minute walk test: 506 ft   GAIT: Distance walked: 500 ft Assistive device utilized: None Level of assistance: Complete Independence Comments: wfl, increased cadence  TODAY'S TREATMENT:  Pt seen for aquatic therapy today.  Treatment took place in water 3.5-4.75 ft in depth at the [facility] pool. Temp of water was 91.  Pt entered/exited the pool via stairs in step-through pattern independently with bilat rail.  * unsupported - walking forward/ backwards with reciprocal arm swing, multiple laps (cues for increased knee / hip flexion in swing through) * side stepping  with arm addct/abdct with yellow hand  floats-> into wide squat x 3 total laps * Farmer carry and marching backwards/ forwards with bilat yellow hand floats at side  * bow and arrow with step back moving backwards across pool * UE on yellow hand floats:  3 way LE kick  * TrA set with hollow noodle pull down to thighs x 10  staggered * staggered stance with kick board row x 10 x 2 sets * L stretch with bent knees and gentle wag the tail * UE on yellow hand floats:  hip abdct/ addct 2 x 10;  leg swings into hip flex/ext x 10  * STS from 3rd step  * straddling yellow noodle and holding yellow hand floats: cycling x 4 widths, suspended jumping jacks; cross country ski  * back against wall - 3 way LE stretch with ankle on blue hollow noodle   PATIENT EDUCATION:  Education details: issued aquatic HEP, reviewed  Person educated: Patient Education method: Explanation Education comprehension: verbalized understanding  HOME EXERCISE PROGRAM: Access Code: [email or web address] URL: [email or web address] Date: 05/22/2023 Prepared by: Geni Bers This aquatic home exercise program from MedBridge utilizes pictures from land based exercises, but has been adapted prior to lamination and issuance.    ASSESSMENT:  CLINICAL IMPRESSION: Pt demonstrated good tolerance to aquatic therapy exercises; elimination of pain while exercising in water.  Pt was issued aquatic HEP and it was reviewed with her. Plan for her to complete x 1 indep and return for any re-instruction or clarifications. Pt is near meeting remaining goals.  PT to assess goals and readiness for d/c vs continuation of therapy.      From Initial evaluation:  Saraha Guggenheim is a 47 y.o. female with seropositive rheumatoid arthritis, degenerative disc disease, lumbar spondylolisthesis, foraminal stenosis  and fibromyalgia syndrome. She reports incident in Jan 2024 which caused injury to her back and she has in downward spiral since then.  She did have a trial of land  based PT a few months ago which exacerbated pain.  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  She has high pain sensitivity which is consistent with dx's, she is pain limited with sleeping and with standing. It is unclear which dx is causing symptoms at this time but she feels it is the lumbar dysfunction. She does demonstrates strength deficits in hip abd and slowed 5x STS test scoring for age. She will benefit from skilled PT intervention with initiation in aquatic setting.  Will transition to land based as approp.  Due to high co-pay she has agreed to 6 visits with potential new pool access membership with indep completion of aquatic HEP  OBJECTIVE IMPAIRMENTS:  decreased activity tolerance, decreased ROM, impaired flexibility, postural dysfunction, and pain.   ACTIVITY LIMITATIONS: carrying, lifting, bending, sitting, standing, squatting, sleeping, stairs, transfers, bed mobility, reach over head, locomotion level, and caring for others   PARTICIPATION LIMITATIONS: meal prep, cleaning, laundry, driving, shopping, community activity, and occupation   PERSONAL FACTORS: Profession, 1-2 comorbidities: fibromyalgia, and RA  are also affecting patient's functional  outcome.    REHAB POTENTIAL: Fair   CLINICAL DECISION MAKING: Evolving/moderate complexity  EVALUATION COMPLEXITY: Moderate   GOALS: Goals reviewed with patient? Yes  SHORT TERM GOALS: Target date: 06/07/23  Pt will report decrease in Normal pain to <4/10 Baseline:7/10 Goal status: MET 05/15/23  2.  Pt will tolerate full aquatic sessions consistently without increase in pain and with improving function to demonstrate good toleration and effectiveness of intervention.   Baseline:  Goal status: Met 05/15/23  3.  Pt will report decreased frequency with waking at night due to pain  Baseline: 2-3 times nightly Goal status:Met 05/15/23  4.  Pt to improve on Foto score to 45% to demonstrate improve  perception of function. Baseline: 39 Goal status: INITIAL  5.  Pt will be indep with aquatic HEP and will have access to pool. Baseline: Indep with land based HEP Goal status: In progress   6.  Pt will be able to stand > 20 minutes without increase in pain to improve toleration to job. Baseline: <15 at eval,  now 30 min Goal status: MET - 05/23/23  7. Pt will improve strength in bilat hip abd by 10lbs to demonstrate improved overall physical function   LONG TERM GOALS: To be set at re-cert as approp   PLAN:  PT FREQUENCY: 1-2x/week  PT DURATION: 6 weeks  PLANNED INTERVENTIONS: Therapeutic exercises, Therapeutic activity, Neuromuscular re-education, Balance training, Gait training, Patient/Family education, Self Care, Joint mobilization, Stair training, Orthotic/Fit training, DME instructions, Aquatic Therapy, Dry Needling, Electrical stimulation, Cryotherapy, Moist heat, Taping, Ionotophoresis 4mg /ml Dexamethasone, Manual therapy, and Re-evaluation.  PLAN FOR NEXT SESSION: Aquatic intervention: strengthening hips and core; improved posture; balance retraining. Aquatic HEP  Mayer Camel, PTA 05/23/23 9:51 AM [facility] [address] [place], [place], [address] Phone: [phone]   Fax:  [phone]

## 2023-05-27 ENCOUNTER — Other Ambulatory Visit: Payer: Self-pay

## 2023-05-27 ENCOUNTER — Ambulatory Visit: Payer: Commercial Managed Care - PPO | Admitting: Nutrition

## 2023-05-28 ENCOUNTER — Other Ambulatory Visit: Payer: Self-pay

## 2023-05-28 ENCOUNTER — Other Ambulatory Visit (HOSPITAL_COMMUNITY): Payer: Self-pay

## 2023-05-28 NOTE — Progress Notes (Signed)
Specialty Pharmacy Refill Coordination Note  Karen Burton is a 47 y.o. female contacted today regarding refills of specialty medication(s) Etanercept   Patient requested Delivery   Delivery date: 06/06/23   Verified address: [address] [place] [address]   Medication will be filled on 06/05/23.

## 2023-05-28 NOTE — Progress Notes (Signed)
Specialty Pharmacy Ongoing Clinical Assessment Note  Karen Burton is a 47 y.o. female who is being followed by the specialty pharmacy service for RxSp Rheumatoid Arthritis   Patient's specialty medication(s) reviewed today: Etanercept   Missed doses in the last 4 weeks: 0   Patient/Caregiver did not have any additional questions or concerns.   Therapeutic benefit summary: Patient is achieving benefit   Adverse events/side effects summary: No adverse events/side effects   Patient's therapy is appropriate to: Continue    Goals Addressed             This Visit's Progress    Minimize recurrence of flares       Patient is on track. Patient will maintain adherence.  Patient reports she is well-controlled with no recent flares.          Follow up:  6 months  Servando Snare Specialty Pharmacist

## 2023-05-30 ENCOUNTER — Encounter (HOSPITAL_BASED_OUTPATIENT_CLINIC_OR_DEPARTMENT_OTHER): Payer: Self-pay | Admitting: Physical Therapy

## 2023-05-30 ENCOUNTER — Ambulatory Visit (HOSPITAL_BASED_OUTPATIENT_CLINIC_OR_DEPARTMENT_OTHER): Payer: Commercial Managed Care - PPO | Admitting: Physical Therapy

## 2023-05-30 DIAGNOSIS — M797 Fibromyalgia: Secondary | ICD-10-CM | POA: Diagnosis not present

## 2023-05-30 DIAGNOSIS — M6281 Muscle weakness (generalized): Secondary | ICD-10-CM | POA: Diagnosis not present

## 2023-05-30 DIAGNOSIS — M5459 Other low back pain: Secondary | ICD-10-CM

## 2023-05-30 DIAGNOSIS — M545 Low back pain, unspecified: Secondary | ICD-10-CM | POA: Diagnosis not present

## 2023-05-30 NOTE — Therapy (Signed)
OUTPATIENT PHYSICAL THERAPY THORACOLUMBAR TREATMENT PHYSICAL THERAPY DISCHARGE SUMMARY  Visits from Start of Care: 7  Current functional level related to goals / functional outcomes: Pt indep with all ADL's and functional mobility   Remaining deficits: Chronic pain although significantly reduced   Education / Equipment: Management of condition/HEP   Patient agrees to discharge. Patient goals were met. Patient is being discharged due to meeting the stated rehab goals.   Patient Name: Karen Burton MRN: [id number] DOB:1976-03-17, 47 y.o., female Today's Date: 05/30/2023  END OF SESSION:  PT End of Session - 05/30/23 0935     Visit Number 7    Number of Visits 16    Date for PT Re-Evaluation 06/07/23    Authorization Type [facility] [employer] P[employer]    PT Start Time [phone]    PT Stop Time 0945    PT Time Calculation (min) 40 min    Activity Tolerance Patient tolerated treatment well    Behavior During Therapy [facility] for tasks assessed/performed                Past Medical History:  Diagnosis Date   Anemia    Dysmenorrhea 12/20/2015   Eczema    mild   Fibroids 12/20/2015   Fibromyalgia    GERD (gastroesophageal reflux disease)    Hypertension    Menorrhagia with regular cycle 12/20/2015   PONV (postoperative nausea and vomiting)    Rash    Reflux    Rheumatoid arthritis (HCC)    Rhinitis    chonic   Vitamin D deficiency    Vulvar itching 12/20/2015   Past Surgical History:  Procedure Laterality Date   CHOLECYSTECTOMY N/A 02/08/2022   Procedure: LAPAROSCOPIC CHOLECYSTECTOMY;  Surgeon: Lewie Chamber, DO;  Location: [facility];  Service: General;  Laterality: N/A;   ESOPHAGOGASTRODUODENOSCOPY ENDOSCOPY     IR GENERIC HISTORICAL  02/07/2016   IR RADIOLOGIST EVAL & MGMT 02/07/2016 Simonne Come, MD [facility] INTERV RAD   WISDOM TOOTH EXTRACTION     Patient Active Problem List   Diagnosis Date Noted   Chronic bilateral low back pain 03/28/2023   Obesity  (BMI 30-39.9) 03/28/2023   Healthcare maintenance 09/26/2022   Insomnia 09/06/2021   Encounter for surveillance of contraceptive pills 08/12/2017   Fibromyalgia 11/06/2016   Uterine leiomyoma 12/20/2015   Essential hypertension 01/12/2013   Rheumatoid arthritis (HCC) 01/12/2013    PCP: Tommie Sams, DO   REFERRING PROVIDER: Pollyann Savoy, MD   REFERRING DIAG:   Fibromyalgia  M51.36 (ICD-10-CM) - DDD (degenerative disc disease), lumbar    Rationale for Evaluation and Treatment: Rehabilitation  THERAPY DIAG:  Other low back pain  Muscle weakness (generalized)  Fibromyalgia  ONSET DATE: Jan 2024  SUBJECTIVE:  SUBJECTIVE STATEMENT:I went to the [facility] and tried it out.  Got good information.  Will be able to get there at least once and week.  From initial evaluation:  Pt injured her back in Jan when assisting her mother into a WC (bent over lifting her feet). Lifting job Banker) increases pain.  No meds help. May be fibromyalgia or RA.  Tried OPPT and it didn't help, made worse.  Sleep with heating pad.  Wakes 2-3x nightly.  Work 12 hour shifts.  Pain high afterward.  PERTINENT HISTORY:  seropositive rheumatoid arthritis, degenerative disc disease and fibromyalgia syndrome.  PAIN:  Are you having pain? Yes: NPRS scale: 2-3/10 Pain location:  hips Pain description: tight, ache Aggravating factors: standing/leaning Relieving factors: sitting,   PRECAUTIONS: None  RED FLAGS: None   WEIGHT BEARING RESTRICTIONS: No  FALLS:  Has patient fallen in last 6 months? No  LIVING ENVIRONMENT: Lives with: lives with their family Lives in: House/apartment Stairs: No ramped entrance Has following equipment at home: Single point cane, Environmental consultant - 2 wheeled, Wheelchair (manual), and Ramped  entry  OCCUPATION: nurse; works at [employer]   PLOF: Independent  PATIENT GOALS: pain relief, be able to sleep, stand > 15 minutes  NEXT MD VISIT: Nov  OBJECTIVE:   DIAGNOSTIC FINDINGS:  IMPRESSION: 1. Lumbar spondylosis and degenerative disc disease, causing mild displacement of the right L2 nerve in the lateral extraforaminal space at L2-3 and borderline right foraminal stenosis at L5-S1. 2. Large heterogeneous presacral structure believed to represent fibroid uterus, only partially included on today's exam.  PATIENT SURVEYS:  FOTO Primary measure 39% with goal of 53% at 12th visit 05/30/23: 63%    COGNITION: Overall cognitive status: Within functional limits for tasks assessed     POSTURE: rounded shoulders, forward head, and increased lumbar lordosis   PALPATION: TTP lumbar paraspinal  LUMBAR ROM:   P!=pain R sided>L AROM eval  Flexion Full P! rising  Extension   Right lateral flexion Full P!  Left lateral flexion Full P!  Right rotation wfl  Left rotation wfl   (Blank rows = not tested)  LOWER EXTREMITY ROM:     wfl  LOWER EXTREMITY MMT:    MMT Right eval Left eval R/L 05/30/23  Hip flexion 44.6 48.5   Hip extension     Hip abduction     Hip adduction 30.5 34.6 47.8 / 48.1  Hip internal rotation     Hip external rotation     Knee flexion     Knee extension 42.7 43.8   Ankle dorsiflexion     Ankle plantarflexion     Ankle inversion     Ankle eversion      (Blank rows = not tested)  LUMBAR SPECIAL TESTS:  Slump test: Negative  FUNCTIONAL TESTS:  5 times sit to stand: 14.69  Last episode which finished in May 2024: 5 times sit to stand: 25.11 sec hands push up on legs  2 minute walk test: 506 ft   GAIT: Distance walked: 500 ft Assistive device utilized: None Level of assistance: Complete Independence Comments: wfl, increased cadence  TODAY'S TREATMENT:  Pt seen for aquatic therapy today.  Treatment took place in water 3.5-4.75 ft in depth at the [facility] pool. Temp of water was 91.  Pt entered/exited the pool via stairs in step-through pattern independently with bilat rail.  * unsupported - walking forward/ backwards with reciprocal arm swing, multiple laps (cues for increased knee / hip flexion in swing through) * side stepping  with arm addct/abdct with yellow hand floats-> into wide squat x 3 total laps * Farmer carry and marching backwards/ forwards with bilat yellow hand floats at side  * bow and arrow with step back moving backwards across pool * UE on yellow hand floats:  3 way LE kick  * TrA set with hollow noodle pull down to thighs x 10  staggered * staggered stance with kick board row x 10 x 2 sets * L stretch with bent knees and gentle wag the tail * UE on yellow hand floats:  hip abdct/ addct 2 x 10;  leg swings into hip flex/ext x 10  * STS from 3rd step  * straddling yellow noodle and holding yellow hand floats: cycling x 4 widths, suspended jumping jacks; cross country ski  * back against wall - 3 way LE stretch with ankle on blue hollow noodle   PATIENT EDUCATION:  Education details: issued aquatic HEP, reviewed  Person educated: Patient Education method: Explanation Education comprehension: verbalized understanding  HOME EXERCISE PROGRAM: Access Code: [email or web address] URL: [email or web address] Date: 05/22/2023 Prepared by: Geni Bers This aquatic home exercise program from MedBridge utilizes pictures from land based exercises, but has been adapted prior to lamination and issuance.    ASSESSMENT:  CLINICAL IMPRESSION: Pt arrives for final aquatic session.  She has completed HEP at [facility] prior to this visit and reports facility will work well and felt like she was able to following issued HEP without difficulty.  Muscle testing complete with  goal met gaining > 10lb in hip abd bilaterally.  She is given minor VC and clarifications on HEP today and demonstrates indep ability to perform.  Reminded to be mindful of core activation with all exercises for enhanced engagement. Pt sensitivity has significantly reduced with aquatic therapy intervention. All goals met and pt is ready for DC.      From Initial evaluation:  Karen Burton is a 47 y.o. female with seropositive rheumatoid arthritis, degenerative disc disease, lumbar spondylolisthesis, foraminal stenosis  and fibromyalgia syndrome. She reports incident in Jan 2024 which caused injury to her back and she has in downward spiral since then.  She did have a trial of land based PT a few months ago which exacerbated pain.  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  She has high pain sensitivity which is consistent with dx's, she is pain limited with sleeping and with standing. It is unclear which dx is causing symptoms at this time but she feels it is the lumbar dysfunction. She does demonstrates strength deficits in hip abd and slowed 5x STS test scoring for age. She will benefit from skilled PT intervention with initiation in aquatic setting.  Will transition to land based as approp.  Due to high co-pay she has agreed to 6 visits with potential new pool access membership with indep completion of aquatic HEP  OBJECTIVE IMPAIRMENTS:  decreased activity tolerance, decreased ROM, impaired flexibility, postural dysfunction, and pain.   ACTIVITY LIMITATIONS: carrying, lifting, bending, sitting, standing, squatting, sleeping, stairs, transfers, bed mobility, reach over head,  locomotion level, and caring for others   PARTICIPATION LIMITATIONS: meal prep, cleaning, laundry, driving, shopping, community activity, and occupation   PERSONAL FACTORS: Profession, 1-2 comorbidities: fibromyalgia, and RA  are also affecting patient's functional outcome.    REHAB  POTENTIAL: Fair   CLINICAL DECISION MAKING: Evolving/moderate complexity  EVALUATION COMPLEXITY: Moderate   GOALS: Goals reviewed with patient? Yes  SHORT TERM GOALS: Target date: 06/07/23  Pt will report decrease in Normal pain to <4/10 Baseline:7/10 Goal status: MET 05/15/23  2.  Pt will tolerate full aquatic sessions consistently without increase in pain and with improving function to demonstrate good toleration and effectiveness of intervention.   Baseline:  Goal status: Met 05/15/23  3.  Pt will report decreased frequency with waking at night due to pain  Baseline: 2-3 times nightly Goal status:Met 05/15/23  4.  Pt to improve on Foto score to 45% to demonstrate improve perception of function. Baseline: 39 Goal status: Met/Exceeded 63% 05/30/23  5.  Pt will be indep with aquatic HEP and will have access to pool. Baseline: Indep with land based HEP Goal status: Met 05/30/23  6.  Pt will be able to stand > 20 minutes without increase in pain to improve toleration to job. Baseline: <15 at eval,  now 30 min Goal status: MET - 05/23/23  7. Pt will improve strength in bilat hip abd by 10lbs to demonstrate improved overall physical function  Baseline: see chart  Goal status: Met/exceeded 05/30/23   LONG TERM GOALS: To be set at re-cert as approp   PLAN:  PT FREQUENCY: 1-2x/week  PT DURATION: 6 weeks  PLANNED INTERVENTIONS: Therapeutic exercises, Therapeutic activity, Neuromuscular re-education, Balance training, Gait training, Patient/Family education, Self Care, Joint mobilization, Stair training, Orthotic/Fit training, DME instructions, Aquatic Therapy, Dry Needling, Electrical stimulation, Cryotherapy, Moist heat, Taping, Ionotophoresis 4mg /ml Dexamethasone, Manual therapy, and Re-evaluation.  PLAN FOR NEXT SESSION: Aquatic intervention: strengthening hips and core; improved posture; balance retraining. Aquatic HEP  Corrie Dandy [place]) Jak Haggar MPT 05/30/23 9:48  AM [facility] [place] [facility] [address] [place], [place], [address] Phone: [phone]   Fax:  [phone]

## 2023-06-03 ENCOUNTER — Other Ambulatory Visit: Payer: Self-pay | Admitting: Physical Medicine & Rehabilitation

## 2023-06-03 ENCOUNTER — Encounter: Payer: Self-pay | Admitting: Physical Medicine & Rehabilitation

## 2023-06-03 ENCOUNTER — Other Ambulatory Visit: Payer: Self-pay

## 2023-06-03 ENCOUNTER — Other Ambulatory Visit (HOSPITAL_COMMUNITY): Payer: Self-pay

## 2023-06-03 MED ORDER — PREGABALIN 75 MG PO CAPS
75.0000 mg | ORAL_CAPSULE | Freq: Two times a day (BID) | ORAL | 2 refills | Status: DC
  Filled 2023-06-03: qty 60, 30d supply, fill #0

## 2023-06-03 MED ORDER — PREGABALIN 50 MG PO CAPS
50.0000 mg | ORAL_CAPSULE | Freq: Two times a day (BID) | ORAL | 2 refills | Status: DC
Start: 2023-06-03 — End: 2023-06-03
  Filled 2023-06-03: qty 60, 30d supply, fill #0

## 2023-06-05 ENCOUNTER — Other Ambulatory Visit (HOSPITAL_COMMUNITY): Payer: Self-pay

## 2023-06-05 ENCOUNTER — Other Ambulatory Visit: Payer: Self-pay

## 2023-06-06 ENCOUNTER — Other Ambulatory Visit: Payer: Self-pay

## 2023-06-06 NOTE — Progress Notes (Signed)
Office Visit Note  Patient: Karen Burton             Date of Birth: Oct 21, 1975           MRN: [id number]             PCP: Tommie Sams, DO Referring: Tommie Sams, DO Visit Date: 06/19/2023 Occupation: @GUAROCC @  Subjective:  Medication management  History of Present Illness: Karen Burton is a 47 y.o. female with seropositive rheumatoid arthritis, osteoarthritis, degenerative disc disease and fibromyalgia syndrome.  Patient states she has not had a flare of rheumatoid arthritis since the last visit.  She has been on Enbrel 50 mg subcu weekly without any interruption.  She states that she has been going for water aerobics her symptoms of lower back pain and fibromyalgia has improved a lot.  She continues to take Cymbalta and methocarbamol.  She takes tramadol on as needed basis.    Activities of Daily Living:  Patient reports morning stiffness for 30-60 minutes.   Patient Reports nocturnal pain.  Difficulty dressing/grooming: Denies Difficulty climbing stairs: Denies Difficulty getting out of chair: Denies Difficulty using hands for taps, buttons, cutlery, and/or writing: Denies  Review of Systems  Constitutional:  Positive for fatigue.  HENT:  Negative for mouth sores and mouth dryness.   Eyes:  Positive for dryness.  Respiratory:  Negative for shortness of breath.   Cardiovascular:  Negative for chest pain and palpitations.  Gastrointestinal:  Positive for constipation. Negative for blood in stool and diarrhea.  Endocrine: Negative for increased urination.  Genitourinary:  Negative for involuntary urination.  Musculoskeletal:  Positive for joint swelling, myalgias, morning stiffness, muscle tenderness and myalgias. Negative for joint pain, gait problem, joint pain and muscle weakness.  Skin:  Positive for sensitivity to sunlight. Negative for color change, rash and hair loss.  Allergic/Immunologic: Negative for susceptible to infections.  Neurological:   Positive for headaches. Negative for dizziness.  Hematological:  Negative for swollen glands.  Psychiatric/Behavioral:  Negative for depressed mood and sleep disturbance. The patient is not nervous/anxious.     PMFS History:  Patient Active Problem List   Diagnosis Date Noted   Chronic bilateral low back pain 03/28/2023   Obesity (BMI 30-39.9) 03/28/2023   Healthcare maintenance 09/26/2022   Insomnia 09/06/2021   Encounter for surveillance of contraceptive pills 08/12/2017   Fibromyalgia 11/06/2016   Uterine leiomyoma 12/20/2015   Essential hypertension 01/12/2013   Rheumatoid arthritis (HCC) 01/12/2013    Past Medical History:  Diagnosis Date   Anemia    Dysmenorrhea 12/20/2015   Eczema    mild   Fibroids 12/20/2015   Fibromyalgia    GERD (gastroesophageal reflux disease)    Hypertension    Menorrhagia with regular cycle 12/20/2015   PONV (postoperative nausea and vomiting)    Rash    Reflux    Rheumatoid arthritis (HCC)    Rhinitis    chonic   Vitamin D deficiency    Vulvar itching 12/20/2015    Family History  Problem Relation Age of Onset   Diabetes Mother    Hypertension Mother    Asthma Mother    Cancer Mother        breast   Thyroid disease Mother    Arthritis Mother    Luiz Blare' disease Mother    Cancer Maternal Grandmother        pancreatic   Fibroids Sister    COPD Maternal Aunt    Asthma Maternal Aunt  Gout Maternal Aunt    Past Surgical History:  Procedure Laterality Date   CHOLECYSTECTOMY N/A 02/08/2022   Procedure: LAPAROSCOPIC CHOLECYSTECTOMY;  Surgeon: Lewie Chamber, DO;  Location: [facility];  Service: General;  Laterality: N/A;   ESOPHAGOGASTRODUODENOSCOPY ENDOSCOPY     IR GENERIC HISTORICAL  02/07/2016   IR RADIOLOGIST EVAL & MGMT 02/07/2016 Simonne Come, MD [facility] INTERV RAD   WISDOM TOOTH EXTRACTION     Social History   Social History Narrative   Not on file   Immunization History  Administered Date(s) Administered   DT  (Pediatric) 02/20/2013   Influenza-Unspecified 07/18/2009, 05/16/2016, 06/03/2017, 04/07/2018, 04/27/2021   PFIZER(Purple Top)SARS-COV-2 Vaccination 04/01/2020, 04/22/2020   Pneumococcal Polysaccharide-23 05/08/2007     Objective: Vital Signs: BP (!) 142/90 (BP Location: Left Arm, Patient Position: Sitting, Cuff Size: Normal)   Pulse 75   Resp 14   Ht 5\' 5"  (1.651 m)   Wt 224 lb (101.6 kg)   BMI 37.28 kg/m    Physical Exam Vitals and nursing note reviewed.  Constitutional:      Appearance: She is well-developed.  HENT:     Head: Normocephalic and atraumatic.  Eyes:     Conjunctiva/sclera: Conjunctivae normal.  Cardiovascular:     Rate and Rhythm: Normal rate and regular rhythm.     Heart sounds: Normal heart sounds.  Pulmonary:     Effort: Pulmonary effort is normal.     Breath sounds: Normal breath sounds.  Abdominal:     General: Bowel sounds are normal.     Palpations: Abdomen is soft.  Musculoskeletal:     Cervical back: Normal range of motion.  Lymphadenopathy:     Cervical: No cervical adenopathy.  Skin:    General: Skin is warm and dry.     Capillary Refill: Capillary refill takes less than 2 seconds.  Neurological:     Mental Status: She is alert and oriented to person, place, and time.  Psychiatric:        Behavior: Behavior normal.      Musculoskeletal Exam: Cervical, thoracic and lumbar spine were in good range of motion.  She has some discomfort range of motion lumbar spine.  Shoulder joints, elbow joints, wrist joints, MCPs PIPs and DIPs Juengel range of motion with no synovitis.  Hip joints, knee joints, ankles, MTPs and PIPs been good range of motion with no synovitis.  She had generalized hyperalgesia and positive tender points.  CDAI Exam: CDAI Score: -- Patient Global: 0 / 100; Provider Global: 0 / 100 Swollen: --; Tender: -- Joint Exam 06/19/2023   No joint exam has been documented for this visit   There is currently no information documented  on the homunculus. Go to the Rheumatology activity and complete the homunculus joint exam.  Investigation: No additional findings.  Imaging: No results found.  Recent Labs: Lab Results  Component Value Date   WBC 5.8 03/28/2023   HGB 13.3 03/28/2023   PLT 270 03/28/2023   NA 138 03/28/2023   K 4.0 03/28/2023   CL 103 03/28/2023   CO2 27 03/28/2023   GLUCOSE 99 03/28/2023   BUN 12 03/28/2023   CREATININE 0.95 03/28/2023   BILITOT 0.4 03/28/2023   ALKPHOS 76 03/06/2022   AST 24 03/28/2023   ALT 35 (H) 03/28/2023   PROT 6.8 03/28/2023   ALBUMIN 4.0 03/06/2022   CALCIUM 9.3 03/28/2023   GFRAA 92 06/10/2020   QFTBGOLDPLUS Negative 06/29/2021    Speciality Comments: TB Gold: 05/07/2022 Neg  Prior therapy includes: methotrexate and Plaquenil (neutropenia) and Humira (painful injections)  Procedures:  No procedures performed Allergies: Ceftin [cefuroxime axetil], Humira [adalimumab], and Methotrexate derivatives   Assessment / Plan:     Visit Diagnoses: Rheumatoid arthritis involving multiple sites with positive rheumatoid factor (HCC)-patient had no synovitis on the examination.  She has been taking Enbrel 50 mg subcu without any interruption.  She denies any flares since the last visit.  High risk medication use - Enbrel 50 mg sq injection once weekly. -Labs obtained in August 2024 CBC and CMP were stable except ALT was mildly elevated.  Will check labs today.  TB Gold was negative in 2022.  Patient states she had a TB Gold this year with [facility].  Plan: CBC with Differential/Platelet, COMPLETE METABOLIC PANEL WITH GFR, QuantiFERON-TB Gold Plus  Spondylosis of lumbar spine -patient reports improvement in her lumbar spine pain since she has been going to water aerobics.  Fibromyalgia -she continues to have some generalized pain and discomfort from fibromyalgia.  She is on following medications.  She has been on Cymbalta, tramadol, methocarbamol, meloxicam without  getting much relief. referred to pain management at the last visit.  Other fatigue-related to fibromyalgia.  History of insomnia-good sleep hygiene was discussed.  History of gastroesophageal reflux (GERD)  History of hypertension-blood pressure was elevated at 141/94.  Repeat blood pressure was 142/90.  Patient was advised to monitor pressure closely and follow-up with the PCP.  Orders: Orders Placed This Encounter  Procedures   CBC with Differential/Platelet   COMPLETE METABOLIC PANEL WITH GFR   QuantiFERON-TB Gold Plus   No orders of the defined types were placed in this encounter.    Follow-Up Instructions: Return in about 5 months (around 11/17/2023) for Rheumatoid arthritis, Osteoarthritis.   Pollyann Savoy, MD  Note - This record has been created using Animal nutritionist.  Chart creation errors have been sought, but may not always  have been located. Such creation errors do not reflect on  the standard of medical care.

## 2023-06-07 ENCOUNTER — Other Ambulatory Visit (HOSPITAL_COMMUNITY): Payer: Self-pay

## 2023-06-07 ENCOUNTER — Other Ambulatory Visit: Payer: Self-pay

## 2023-06-07 MED ORDER — FLUTICASONE PROPIONATE 50 MCG/ACT NA SUSP: 1.0000 | Freq: Two times a day (BID) | NASAL | 2 refills | Status: DC

## 2023-06-07 MED ORDER — HYDROCHLOROTHIAZIDE 12.5 MG PO TABS
12.5000 mg | ORAL_TABLET | Freq: Every day | ORAL | 3 refills | Status: DC
  Filled 2023-06-07: qty 90, 90d supply, fill #0
  Filled 2023-09-11: qty 90, 90d supply, fill #1
  Filled 2023-12-12: qty 90, 90d supply, fill #2
  Filled 2024-03-09: qty 90, 90d supply, fill #3

## 2023-06-07 MED ORDER — AMLODIPINE BESYLATE 5 MG PO TABS
5.0000 mg | ORAL_TABLET | Freq: Every day | ORAL | 3 refills | Status: DC
  Filled 2023-06-07: qty 90, 90d supply, fill #0
  Filled 2023-09-11: qty 90, 90d supply, fill #1
  Filled 2023-12-12: qty 90, 90d supply, fill #2
  Filled 2024-03-09: qty 90, 90d supply, fill #3

## 2023-06-07 MED ORDER — LOSARTAN POTASSIUM 50 MG PO TABS
50.0000 mg | ORAL_TABLET | Freq: Every day | ORAL | 3 refills | Status: DC
  Filled 2023-07-04: qty 90, 90d supply, fill #0
  Filled 2023-09-29: qty 90, 90d supply, fill #1
  Filled 2024-01-09: qty 90, 90d supply, fill #2

## 2023-06-13 ENCOUNTER — Encounter: Payer: Self-pay | Admitting: Physical Medicine & Rehabilitation

## 2023-06-13 ENCOUNTER — Other Ambulatory Visit (HOSPITAL_COMMUNITY): Payer: Self-pay

## 2023-06-13 ENCOUNTER — Other Ambulatory Visit: Payer: Self-pay

## 2023-06-13 ENCOUNTER — Encounter
Payer: Commercial Managed Care - PPO | Attending: Physical Medicine & Rehabilitation | Admitting: Physical Medicine & Rehabilitation

## 2023-06-13 VITALS — BP 138/98 | HR 89 | Ht 65.0 in | Wt 220.0 lb

## 2023-06-13 DIAGNOSIS — M0579 Rheumatoid arthritis with rheumatoid factor of multiple sites without organ or systems involvement: Secondary | ICD-10-CM | POA: Diagnosis not present

## 2023-06-13 DIAGNOSIS — M545 Low back pain, unspecified: Secondary | ICD-10-CM | POA: Insufficient documentation

## 2023-06-13 DIAGNOSIS — F39 Unspecified mood [affective] disorder: Secondary | ICD-10-CM | POA: Insufficient documentation

## 2023-06-13 DIAGNOSIS — M797 Fibromyalgia: Secondary | ICD-10-CM | POA: Diagnosis not present

## 2023-06-13 DIAGNOSIS — G8929 Other chronic pain: Secondary | ICD-10-CM | POA: Diagnosis not present

## 2023-06-13 MED ORDER — PREGABALIN 50 MG PO CAPS
50.0000 mg | ORAL_CAPSULE | Freq: Two times a day (BID) | ORAL | 5 refills | Status: DC
Start: 2023-06-13 — End: 2023-12-25
  Filled 2023-06-13 (×2): qty 60, 30d supply, fill #0
  Filled 2023-07-11: qty 60, 30d supply, fill #1
  Filled 2023-08-17: qty 60, 30d supply, fill #2
  Filled 2023-09-18 (×2): qty 60, 30d supply, fill #3
  Filled 2023-10-16: qty 60, 30d supply, fill #4
  Filled 2023-11-19: qty 60, 30d supply, fill #5

## 2023-06-13 NOTE — Progress Notes (Signed)
Subjective:    Patient ID: Karen Burton, female    DOB: 1975-09-05, 47 y.o.   MRN: [id number]  HPI  HPI  Karen Burton is a 47 y.o. year old female  who  has a past medical history of Anemia, Dysmenorrhea (12/20/2015), Eczema, Fibroids (12/20/2015), Fibromyalgia, GERD (gastroesophageal reflux disease), Hypertension, Menorrhagia with regular cycle (12/20/2015), PONV (postoperative nausea and vomiting), Rash, Reflux, Rheumatoid arthritis (HCC), Rhinitis, Vitamin D deficiency, and Vulvar itching (12/20/2015).   They are presenting to PM&R clinic as a new patient for pain management evaluation. They were referred by Dr. Adriana Simas for treatment of fibromyalgia pain.  She has a history of RA and reports this was diagnosed 17 years ago.  Initially her primary location of pain was her lower back.  She later developed pain in her hips and buttocks bilaterally.  Pain in her hips is throbbing in quality.  Over time she feels as the pain has spread to other locations including her arms and legs.  Her neck and shoulders are often very painful.  She feels like her entire body is stressed and this is contributing to her pain.  She denies shooting pain down her arms or legs.  Patient reports he is generally sensitive to medications and they will often make her sleepy.  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  She also helps to take care of her mother.  After doing a lot of activity she often has to lay in bed for a few days to recover.  She reports poor sleep, sometimes takes Ambien to help this.  Reports frequent constipation with occasional diarrhea.  Reports history of IBS  Decreased mood at times.  Denies SI or HI.  She reports she has a lot of stress because she feels like the pain could prevent her from being able to care for her mother and could limit her from working.   Red flag symptoms: No red flags for back pain endorsed in Hx or ROS  Medications  tried: Topical medications- Aleve roll on  Nsaids Ibuprofen- limited by liver enzyme elevation, meloxicam- didn't help much  Tylenol  Helps a little  Opiates  Tramadol- didn't help , used this for short time, helped her sleep  Gabapentin- tried it one time  TCAs  denies  SNRIs  Cymbalta- has been on it for a while Robaxin- didn't help   Other treatments: PT - March 2024, restarted Aquatic therapy TENs unit  has not tried it before  Injections- SI joint injections- didn't help , Trigger point injections- didn't help  Dry needling didn't help   Surgery Denies joint/back surgeries    Prior UDS results: No results found for: "LABOPIA", "COCAINSCRNUR", "LABBENZ", "AMPHETMU", "THCU", "LABBARB"    Interval History 06/13/23  Karen Burton is here for follow-up regarding her chronic pain.  Patient reports that her body wide pain is much improved with aquatic therapy.  Her worst pain continues to be her lower back average about 3 out of 10.  Currently pain is 0 out of 10.  She reports too much sedation with Lyrica 75 mg twice daily but says she did not have this issue with 50 mg dose.  Pain Inventory Average Pain 3 Pain Right Now 0 My pain is constant, dull, and stabbing  In the last 24 hours, has pain interfered with the following? General activity 4 Relation with others 4 Enjoyment of life 4 What TIME of day is your pain at its  worst? morning , daytime, evening, and night Sleep (in general) Fair  Pain is worse with: walking, bending, sitting, standing, and some activites Pain improves with: rest, heat/ice, therapy/exercise, and medication Relief from Meds: 5     Family History  Problem Relation Age of Onset   Diabetes Mother    Hypertension Mother    Asthma Mother    Cancer Mother        breast   Thyroid disease Mother    Arthritis Mother    Luiz Blare' disease Mother    Cancer Maternal Grandmother        pancreatic   Fibroids Sister    COPD Maternal Aunt    Asthma  Maternal Aunt    Gout Maternal Aunt    Social History   Socioeconomic History   Marital status: Single    Spouse name: Not on file   Number of children: 0   Years of education: Not on file   Highest education level: Not on file  Occupational History   Not on file  Tobacco Use   Smoking status: Never    Passive exposure: Never   Smokeless tobacco: Never  Vaping Use   Vaping status: Never Used  Substance and Sexual Activity   Alcohol use: No   Drug use: Never   Sexual activity: Not Currently    Birth control/protection: Pill  Other Topics Concern   Not on file  Social History Narrative   Not on file   Social Determinants of Health   Financial Resource Strain: Low Risk  (05/15/2022)   Overall Financial Resource Strain (CARDIA)    Difficulty of Paying Living Expenses: Not hard at all  Food Insecurity: No Food Insecurity (05/15/2022)   Hunger Vital Sign    Worried About Running Out of Food in the Last Year: Never true    Ran Out of Food in the Last Year: Never true  Transportation Needs: No Transportation Needs (05/15/2022)   PRAPARE - Administrator, Civil Service (Medical): No    Lack of Transportation (Non-Medical): No  Physical Activity: Insufficiently Active (05/15/2022)   Exercise Vital Sign    Days of Exercise per Week: 3 days    Minutes of Exercise per Session: 30 min  Stress: Stress Concern Present (05/15/2022)   [employer] of Occupational Health - Occupational Stress Questionnaire    Feeling of Stress : To some extent  Social Connections: Moderately Isolated (05/15/2022)   Social Connection and Isolation Panel [NHANES]    Frequency of Communication with Friends and Family: More than three times a week    Frequency of Social Gatherings with Friends and Family: More than three times a week    Attends Religious Services: More than 4 times per year    Active Member of Clubs or Organizations: No    Attends Banker Meetings: Never     Marital Status: Never married   Past Surgical History:  Procedure Laterality Date   CHOLECYSTECTOMY N/A 02/08/2022   Procedure: LAPAROSCOPIC CHOLECYSTECTOMY;  Surgeon: Lewie Chamber, DO;  Location: [facility];  Service: General;  Laterality: N/A;   ESOPHAGOGASTRODUODENOSCOPY ENDOSCOPY     IR GENERIC HISTORICAL  02/07/2016   IR RADIOLOGIST EVAL & MGMT 02/07/2016 Simonne Come, MD [facility] INTERV RAD   WISDOM TOOTH EXTRACTION     Past Medical History:  Diagnosis Date   Anemia    Dysmenorrhea 12/20/2015   Eczema    mild   Fibroids 12/20/2015   Fibromyalgia  GERD (gastroesophageal reflux disease)    Hypertension    Menorrhagia with regular cycle 12/20/2015   PONV (postoperative nausea and vomiting)    Rash    Reflux    Rheumatoid arthritis (HCC)    Rhinitis    chonic   Vitamin D deficiency    Vulvar itching 12/20/2015   There were no vitals taken for this visit.  Opioid Risk Score:   Fall Risk Score:  `1  Depression screen PHQ 2/9     05/03/2023    1:11 PM 03/28/2023    9:39 AM 09/26/2022    9:39 AM 05/15/2022    1:51 PM 02/13/2021    9:58 AM 01/02/2021   11:22 AM 08/08/2020    1:11 PM  Depression screen PHQ 2/9  Decreased Interest 3 1 1 1 1 1  0  Down, Depressed, Hopeless 2 1 1 1 1  0 1  PHQ - 2 Score 5 2 2 2 2 1 1   Altered sleeping 2 2 1 3 2 2    Tired, decreased energy 3 2 0 3 2 3    Change in appetite 0 0 0 0 0 0   Feeling bad or failure about yourself  1 0 1 1 1  0   Trouble concentrating 0 0 0 1 1 0   Moving slowly or fidgety/restless 0 0 0 0 0 0   Suicidal thoughts 0 0 0 0 0    PHQ-9 Score 11 6 4 10 8 6    Difficult doing work/chores Somewhat difficult Somewhat difficult Somewhat difficult  Somewhat difficult       Review of Systems  Musculoskeletal:  Positive for back pain and neck pain.       Bilateral shin pain Buttocks pain Bilateral shoulder pain  All other systems reviewed and are negative.     Objective:   Physical Exam   Gen: no distress,  normal appearing HEENT: oral mucosa pink and moist, NCAT Chest: normal effort, normal rate of breathing Abd: soft, non-distended Ext: no edema Psych: pleasant, appropriate Skin: intact Neuro: Awake, follows commands, cranial nerves II through XII grossly intact, normal speech and language Strength moving all 4 extremities to gravity and resistance Sensory exam normal for light touch and pain in all 4 limbs. No limb ataxia or cerebellar signs. No abnormal tone appreciated.   Musculoskeletal:  Tender to palpation throughout her C-spine, L-spine, periscapular muscles Tender to palpation throughout bilateral upper and lower extremities including shoulders, elbows, wrists, hips, knees, ankles-overall decreased from prior Pain with lumbar spinal extension Facet loading mildly positive  MRI L-spine 2-3 24 IMPRESSION: 1. Lumbar spondylosis and degenerative disc disease, causing mild displacement of the right L2 nerve in the lateral extraforaminal space at L2-3 and borderline right foraminal stenosis at L5-S1. 2. Large heterogeneous presacral structure believed to represent fibroid uterus, only partially included on today's exam.     Assessment & Plan:   1) Fibromyalgia  -Diagnosis of fibromyalgia being consistent with her body wide pain, mood disorder, sleep disorder, IBS history 2) Rheumatoid arthritis  -Appears to be well-controlled continue follow-up with rheumatology 3) Mood disorder  -She declines to discuss this in further detail, denies SI or HI 4) Chronic right lower back pain without sciatica.  Lumbar spondylosis noted on recent MRI  -Continue duloxetine 60 mg daily -Continue as needed Tylenol -Patient improvement with aquatic therapy.  Discussed continuing exercises.  She is doing this in a [facility] pool.  Provided letter stating that I think she would benefit from continued exercises  in the pool. -Zynex nexwave ordered prior visit -Patient had too much sedation with Lyrica 75  mg.  Will go back down to Lyrica 50 mg twice daily.  Could try Lyrica 50 mg 3 times daily if needed at a later time. -Discussed foods that could be helpful for pain prior visit -Discussed gradual progression of low impact exercise -Patient appears to have a lot of pain with spinal extension facet loading, MRI L-spine with evidence of L2-3, L3-4 facet arthropathy.  Consider referral for medial branch block/RFA at a later time

## 2023-06-19 ENCOUNTER — Ambulatory Visit: Payer: Commercial Managed Care - PPO | Attending: Rheumatology | Admitting: Rheumatology

## 2023-06-19 ENCOUNTER — Encounter: Payer: Self-pay | Admitting: Rheumatology

## 2023-06-19 VITALS — BP 142/90 | HR 75 | Resp 14 | Ht 65.0 in | Wt 224.0 lb

## 2023-06-19 DIAGNOSIS — M47816 Spondylosis without myelopathy or radiculopathy, lumbar region: Secondary | ICD-10-CM | POA: Diagnosis not present

## 2023-06-19 DIAGNOSIS — R5383 Other fatigue: Secondary | ICD-10-CM | POA: Diagnosis not present

## 2023-06-19 DIAGNOSIS — Z8679 Personal history of other diseases of the circulatory system: Secondary | ICD-10-CM | POA: Diagnosis not present

## 2023-06-19 DIAGNOSIS — M797 Fibromyalgia: Secondary | ICD-10-CM

## 2023-06-19 DIAGNOSIS — M0579 Rheumatoid arthritis with rheumatoid factor of multiple sites without organ or systems involvement: Secondary | ICD-10-CM

## 2023-06-19 DIAGNOSIS — Z8719 Personal history of other diseases of the digestive system: Secondary | ICD-10-CM | POA: Diagnosis not present

## 2023-06-19 DIAGNOSIS — Z79899 Other long term (current) drug therapy: Secondary | ICD-10-CM | POA: Diagnosis not present

## 2023-06-19 DIAGNOSIS — Z87898 Personal history of other specified conditions: Secondary | ICD-10-CM | POA: Diagnosis not present

## 2023-06-19 NOTE — Patient Instructions (Signed)
Standing Labs We placed an order today for your standing lab work.   Please have your standing labs drawn in February and every 3 months  Please have your labs drawn 2 weeks prior to your appointment so that the provider can discuss your lab results at your appointment, if possible.  Please note that you may see your imaging and lab results in MyChart before we have reviewed them. We will contact you once all results are reviewed. Please allow our office up to 72 hours to thoroughly review all of the results before contacting the office for clarification of your results.  WALK-IN LAB HOURS  Monday through Thursday from 8:00 am -12:30 pm and 1:00 pm-5:00 pm and Friday from 8:00 am-12:00 pm.  Patients with office visits requiring labs will be seen before walk-in labs.  You may encounter longer than normal wait times. Please allow additional time. Wait times may be shorter on  Monday and Thursday afternoons.  We do not book appointments for walk-in labs. We appreciate your patience and understanding with our staff.   Labs are drawn by [facility]. Please bring your co-pay at the time of your lab draw.  You may receive a bill from [facility] for your lab work.  Please note if you are on Hydroxychloroquine and and an order has been placed for a Hydroxychloroquine level,  you will need to have it drawn 4 hours or more after your last dose.  If you wish to have your labs drawn at another location, please call the office 24 hours in advance so we can fax the orders.  The office is located at [address], [address], [place], [place] [address]   If you have any questions regarding directions or hours of operation,  please call [phone].   As a reminder, please drink plenty of water prior to coming for your lab work. Thanks!    Vaccines You are taking a medication(s) that can suppress your immune system.  The following immunizations are recommended: Flu annually Covid-19  Td/Tdap (tetanus,  diphtheria, pertussis) every 10 years Pneumonia (Prevnar 15 then Pneumovax 23 at least 1 year apart.  Alternatively, can take Prevnar 20 without needing additional dose) Shingrix: 2 doses from 4 weeks to 6 months apart  Please check with your PCP to make sure you are up to date. If you have signs or symptoms of an infection or start antibiotics: First, call your PCP for workup of your infection. Hold your medication through the infection, until you complete your antibiotics, and until symptoms resolve if you take the following: Injectable medication (Actemra, Benlysta, Cimzia, Cosentyx, Enbrel, Humira, Kevzara, Orencia, Remicade, Simponi, Stelara, Taltz, Tremfya) Methotrexate Leflunomide (Arava) Mycophenolate (Cellcept) Osborne [place], or Rinvoq  He is getting annual skin examination to screen for skin cancer while you are on Enbrel.  Please use sunscreen and sun protection.

## 2023-06-20 NOTE — Progress Notes (Signed)
CBC and CMP were normal except liver function ALT is mildly elevated.  Patient should avoid all NSAIDs and alcohol use.  She has a prescription for diclofenac.

## 2023-06-21 ENCOUNTER — Other Ambulatory Visit (HOSPITAL_COMMUNITY): Payer: Self-pay

## 2023-06-22 LAB — COMPLETE METABOLIC PANEL WITH GFR
AG Ratio: 1.3 (calc) (ref 1.0–2.5)
ALT: 37 U/L — ABNORMAL HIGH (ref 6–29)
AST: 27 U/L (ref 10–35)
Albumin: 3.7 g/dL (ref 3.6–5.1)
Alkaline phosphatase (APISO): 65 U/L (ref 31–125)
BUN: 12 mg/dL (ref 7–25)
CO2: 29 mmol/L (ref 20–32)
Calcium: 9.1 mg/dL (ref 8.6–10.2)
Chloride: 103 mmol/L (ref 98–110)
Creat: 0.89 mg/dL (ref 0.50–0.99)
Globulin: 2.9 g/dL (ref 1.9–3.7)
Glucose, Bld: 86 mg/dL (ref 65–99)
Potassium: 4.3 mmol/L (ref 3.5–5.3)
Sodium: 140 mmol/L (ref 135–146)
Total Bilirubin: 0.3 mg/dL (ref 0.2–1.2)
Total Protein: 6.6 g/dL (ref 6.1–8.1)
eGFR: 80 mL/min/{1.73_m2} (ref >=60)

## 2023-06-22 LAB — QUANTIFERON-TB GOLD PLUS
Mitogen-NIL: >10 [IU]/mL
NIL: 0.06 [IU]/mL
QuantiFERON-TB Gold Plus: NEGATIVE
TB1-NIL: <0 [IU]/mL
TB2-NIL: 0 [IU]/mL

## 2023-06-22 LAB — CBC WITH DIFFERENTIAL/PLATELET
Absolute Lymphocytes: 1842 {cells}/uL (ref 850–3900)
Absolute Monocytes: 568 {cells}/uL (ref 200–950)
Basophils Absolute: 39 {cells}/uL (ref 0–200)
Basophils Relative: 0.8 %
Eosinophils Absolute: 98 {cells}/uL (ref 15–500)
Eosinophils Relative: 2 %
HCT: 39.9 % (ref 35.0–45.0)
Hemoglobin: 13.2 g/dL (ref 11.7–15.5)
MCH: 30.6 pg (ref 27.0–33.0)
MCHC: 33.1 g/dL (ref 32.0–36.0)
MCV: 92.6 fL (ref 80.0–100.0)
MPV: 10.6 fL (ref 7.5–12.5)
Monocytes Relative: 11.6 %
Neutro Abs: 2352 {cells}/uL (ref 1500–7800)
Neutrophils Relative %: 48 %
Platelets: 243 10*3/uL (ref 140–400)
RBC: 4.31 10*6/uL (ref 3.80–5.10)
RDW: 12 % (ref 11.0–15.0)
Total Lymphocyte: 37.6 %
WBC: 4.9 10*3/uL (ref 3.8–10.8)

## 2023-06-23 NOTE — Progress Notes (Signed)
TB Gold is negative.

## 2023-06-24 ENCOUNTER — Encounter: Payer: Self-pay | Admitting: *Deleted

## 2023-06-25 ENCOUNTER — Other Ambulatory Visit (HOSPITAL_COMMUNITY): Payer: Self-pay

## 2023-06-28 ENCOUNTER — Other Ambulatory Visit: Payer: Self-pay

## 2023-07-01 ENCOUNTER — Other Ambulatory Visit: Payer: Self-pay

## 2023-07-01 ENCOUNTER — Telehealth: Payer: Self-pay

## 2023-07-01 NOTE — Telephone Encounter (Signed)
Received notification from [facility] that a new PA is required.  Submitted an URGENT Prior Authorization request to [facility] for ENBREL via CoverMyMeds. Will update once we receive a response.  Key: [id number]

## 2023-07-01 NOTE — Progress Notes (Signed)
Specialty Pharmacy Refill Coordination Note  Karen Burton is a 47 y.o. female contacted today regarding refills of specialty medication(s) Etanercept   Patient requested Delivery   Delivery date: 07/02/23   Verified address: [address] [place] [address]   Medication will be filled on 07/01/23.

## 2023-07-01 NOTE — Telephone Encounter (Signed)
Received notification from [facility] regarding a prior authorization for ENBREL. Authorization has been APPROVED from 07/01/23 to 06/29/24. Approval letter sent to scan center.  Patient must continue to fill through [facility]: [phone]   Authorization # [id number]  Chesley Mires, PharmD, MPH, BCPS, CPP Clinical Pharmacist (Rheumatology and Pulmonology)

## 2023-07-02 ENCOUNTER — Other Ambulatory Visit: Payer: Self-pay

## 2023-07-04 ENCOUNTER — Other Ambulatory Visit: Payer: Self-pay

## 2023-07-04 ENCOUNTER — Other Ambulatory Visit (HOSPITAL_COMMUNITY): Payer: Self-pay

## 2023-07-11 ENCOUNTER — Encounter: Payer: Self-pay | Admitting: Family Medicine

## 2023-07-11 ENCOUNTER — Other Ambulatory Visit (HOSPITAL_COMMUNITY): Payer: Self-pay

## 2023-07-12 ENCOUNTER — Other Ambulatory Visit (HOSPITAL_COMMUNITY): Payer: Self-pay

## 2023-07-12 ENCOUNTER — Telehealth: Payer: Self-pay | Admitting: *Deleted

## 2023-07-12 NOTE — Telephone Encounter (Signed)
This request would be necessary to have Dr. Adriana Simas weigh in on thank you Please notify patient that this is being forwarded to Dr. Adriana Simas who will be able to handle this next week thank you-May send MyChart message

## 2023-07-12 NOTE — Telephone Encounter (Signed)
Copied from CRM #[id number]. Topic: Clinical - Medication Question >> Jul 12, 2023  3:22 PM [name] wrote: Reason for CRM: Pt wants to know if a prescription for tremadol could be refilled by him, as it was originally prescribed by previous PCP. Pt is expecting a callback

## 2023-07-13 ENCOUNTER — Other Ambulatory Visit (HOSPITAL_COMMUNITY): Payer: Self-pay

## 2023-07-14 ENCOUNTER — Other Ambulatory Visit: Payer: Self-pay | Admitting: Family Medicine

## 2023-07-15 ENCOUNTER — Encounter: Payer: Self-pay | Admitting: Nutrition

## 2023-07-15 ENCOUNTER — Encounter: Payer: Commercial Managed Care - PPO | Attending: Family Medicine | Admitting: Nutrition

## 2023-07-15 NOTE — Progress Notes (Signed)
Medical Nutrition Therapy  Appointment Start time:  0800  Appointment End time:  0915  Primary concerns today: Obesity  Referral diagnosis: E66.9 Preferred learning style: No Preference  Learning readiness: Ready   NUTRITION ASSESSMENT [employer] Employee Visit 1    [phone] 47 yr old bfemale here for obesity. PMH: RA, HTN and Fibromyalgia. She is concerend about her recent gaining weight and wants toto eat better, Family history, HTN, and RA- Dx 11 yrs. Recently cut out sodas. Drinks some water. Drinks cranberry juice due to recent UTI. Has had back issues.   Did water therapy in the past. [facility]. Sees a pain management doctor.  She is willing work on lifestylel medicine to improve her health and improve or reverse some of her chronic disease.  Clinical Medical Hx:  Past Medical History:  Diagnosis Date   Anemia    Dysmenorrhea 12/20/2015   Eczema    mild   Fibroids 12/20/2015   Fibromyalgia    GERD (gastroesophageal reflux disease)    Hypertension    Menorrhagia with regular cycle 12/20/2015   PONV (postoperative nausea and vomiting)    Rash    Reflux    Rheumatoid arthritis (HCC)    Rhinitis    chonic   Vitamin D deficiency    Vulvar itching 12/20/2015    Medications:  Current Outpatient Medications on File Prior to Visit  Medication Sig Dispense Refill   amLODipine (NORVASC) 5 MG tablet Take 1 tablet (5 mg total) by mouth daily for blood pressure. 90 tablet 3   BIOTIN PO Take by mouth daily.     Cholecalciferol (VITAMIN D-3 PO) Take by mouth daily.     diclofenac (VOLTAREN) 50 MG EC tablet Take 1 tablet (50 mg total) by mouth 2 (two) times daily with a meal. Do not take with other NSAIDs. 60 tablet 2   docusate sodium (COLACE) 100 MG capsule Take 100 mg by mouth daily.      DULoxetine (CYMBALTA) 60 MG capsule Take 1 capsule (60 mg total) by mouth daily. 30 capsule 2   etanercept (ENBREL MINI) 50 MG/ML  injection Inject 50 mg into the skin once a week. 12 mL 0   fluticasone (FLONASE) 50 MCG/ACT nasal spray Place 1 spray into both nostrils 2 (two) times daily. 16 g 2   hydrochlorothiazide (HYDRODIURIL) 12.5 MG tablet Take 1 tablet (12.5 mg total) by mouth daily. 90 tablet 3   losartan (COZAAR) 50 MG tablet Take 1 tablet (50 mg total) by mouth daily. 90 tablet 3   methocarbamol (ROBAXIN) 500 MG tablet Take 1 tablet (500 mg total) by mouth 2 (two) times daily as needed for muscle spasms. 30 tablet 0   Multiple Vitamins-Minerals (MULTIVITAMIN WOMEN PO) Take 1 tablet by mouth daily.     Norethindrone Acetate-Ethinyl Estrad-FE (JUNEL FE 24) 1-20 MG-MCG(24) tablet TAKE 1 TABLET BY MOUTH ONCE DAILY (USE NDC [id number]) 84 tablet 4   pregabalin (LYRICA) 50 MG capsule Take 1 capsule (50 mg total) by mouth 2 (two) times daily. 60 capsule 5   traMADol (ULTRAM) 50 MG tablet Take 1 tablet (50 mg total) by mouth every 6 (six) hours as needed. 30 tablet 0   traZODone (DESYREL) 50 MG tablet Take 1/2 tablet (25 mg total) by mouth at bedtime as needed. 30 tablet 1   tretinoin (RETIN-A) 0.025 % cream Apply pea size amount to affected regions 2-3 x a week  building up to every night as tolerated. (Patient not taking: Reported on 06/19/2023) 20 g 11   zolpidem (AMBIEN) 5 MG tablet Take 1 tablet (5 mg total) by mouth at bedtime as needed for sleep. 30 tablet 0   No current facility-administered medications on file prior to visit.    Labs:     Latest Ref Rng & Units 06/19/2023   12:27 PM 03/28/2023   10:25 AM 11/23/2022   10:25 AM  CMP  Glucose 65 - 99 mg/dL 86  99  95   BUN 7 - 25 mg/dL 12  12  11    Creatinine 0.50 - 0.99 mg/dL 1.61  0.96  0.45   Sodium 135 - 146 mmol/L 140  138  139   Potassium 3.5 - 5.3 mmol/L 4.3  4.0  4.2   Chloride 98 - 110 mmol/L 103  103  105   CO2 20 - 32 mmol/L 29  27  28    Calcium 8.6 - 10.2 mg/dL 9.1  9.3  8.8   Total Protein 6.1 - 8.1 g/dL 6.6  6.8  6.4   Total Bilirubin 0.2 -  1.2 mg/dL 0.3  0.4  0.2   AST 10 - 35 U/L 27  24  20    ALT 6 - 29 U/L 37  35  25    Lipid Panel     Component Value Date/Time   CHOL 139 03/06/2022 0915   TRIG 134 03/06/2022 0915   HDL 50 03/06/2022 0915   CHOLHDL 2.8 03/06/2022 0915   LDLCALC 66 03/06/2022 0915   LABVLDL 23 03/06/2022 0915   Lab Results  Component Value Date   HGBA1C 5.5 03/06/2022    Notable Signs/Symptoms: Tired, fatigue, poor energy  Lifestyle & Dietary Hx Lives with his mom and sister. Bedtime 12 mignight  Estimated daily fluid intake: 30 oz Supplements: VIt D Sleep: 5-6 hres due to back pain Stress / self-care: takes for her mom who is no ambulatory  Current average weekly physical activity: ADL, use to walk 1-1/2; gets in 12,000 steps.  24-Hr Dietary Recall First Meal: Skips meals  Snack:  Second Meal:12- 2 pmusrually; 2 pc of sausage and 2 pc  of bacon, and 2 eggs and 1 stlice  Daves Killer bread, Plant based butter.water Snack: grapes, banana Third Meal:  7 pm: [place] stew- didn't feel good, water, cranberry juice Snack:  Beverages: water  Estimated Energy Needs Calories: 1200 Carbohydrate: 135g Protein: 90g Fat: 33g   NUTRITION DIAGNOSIS  NB-1.1 Food and nutrition-related knowledge deficit As related to High calorie high fat diet.  As evidenced by BMI 37.   NUTRITION INTERVENTION  Nutrition education (E-1) on the following topics:  Nutrition  education provided on My Plate, CHO counting, meal planning, portion sizes, timing of meals, avoiding snacks between meals benefits of exercising 30 minutes per day and prevention of complications of DM. Nutrition and Diabetes education provided on My Plate, CHO counting, meal planning, portion sizes, timing of meals, avoiding snacks between meals unless having a low blood sugar, target ranges for A1C and blood sugars, signs/symptoms and treatment of hyper/hypoglycemia, monitoring blood sugars, taking medications as prescribed, benefits of  exercising 30 minutes per day and prevention of complications of DM.   Handouts Provided Include  Lifestyle Medicine handouts  Learning Style & Readiness for Change Teaching method utilized: Visual & Auditory  Demonstrated degree of understanding via: Teach Back  Barriers to learning/adherence to lifestyle change: none  Goals Established by Pt Goals  Reduce eating out to twice a week. Reduce bacon or sausage to 1 piece to no more than 3 times per week. Eat 1 piece of fruit with each meals. Will aim to eat 1 vegetable with lunch and dinner daily.l Will drink 3 bottles of water per day.    MONITORING & EVALUATION Dietary intake, weekly physical activity, and weight in 1 month.  Next Steps  Patient is to work .

## 2023-07-15 NOTE — Patient Instructions (Signed)
Goals  Reduce eating out to twice a week. Reduce bacon or sausage to 1 piece to no more than 3 times per week. Eat 1 piece of fruit with each meals. Will aim to eat 1 vegetable with lunch and dinner daily.l Will drink 3 bottles of water per day.

## 2023-07-18 ENCOUNTER — Other Ambulatory Visit: Payer: Self-pay | Admitting: Family Medicine

## 2023-07-18 MED ORDER — TRAMADOL HCL 50 MG PO TABS
50.0000 mg | ORAL_TABLET | Freq: Three times a day (TID) | ORAL | 0 refills | Status: DC | PRN
  Filled 2023-07-18: qty 15, 5d supply, fill #0

## 2023-07-19 ENCOUNTER — Encounter: Payer: Self-pay | Admitting: *Deleted

## 2023-07-19 ENCOUNTER — Other Ambulatory Visit: Payer: Self-pay

## 2023-07-19 NOTE — Telephone Encounter (Signed)
Cook, Jayce G, DO   ? ?Rx sent.   ? ?

## 2023-07-22 ENCOUNTER — Telehealth: Payer: Self-pay | Admitting: *Deleted

## 2023-07-22 ENCOUNTER — Other Ambulatory Visit: Payer: Self-pay | Admitting: Rheumatology

## 2023-07-22 ENCOUNTER — Other Ambulatory Visit: Payer: Self-pay | Admitting: Family Medicine

## 2023-07-22 ENCOUNTER — Other Ambulatory Visit (HOSPITAL_COMMUNITY): Payer: Self-pay

## 2023-07-22 ENCOUNTER — Other Ambulatory Visit: Payer: Self-pay

## 2023-07-22 DIAGNOSIS — R300 Dysuria: Secondary | ICD-10-CM

## 2023-07-22 MED ORDER — NITROFURANTOIN MONOHYD MACRO 100 MG PO CAPS
100.0000 mg | ORAL_CAPSULE | Freq: Two times a day (BID) | ORAL | 0 refills | Status: DC
  Filled 2023-07-22: qty 14, 7d supply, fill #0

## 2023-07-22 MED ORDER — DULOXETINE HCL 60 MG PO CPEP
60.0000 mg | ORAL_CAPSULE | Freq: Every day | ORAL | 2 refills | Status: DC
  Filled 2023-07-22: qty 30, 30d supply, fill #0
  Filled 2023-08-20: qty 30, 30d supply, fill #1
  Filled 2023-09-18 (×2): qty 30, 30d supply, fill #2

## 2023-07-22 NOTE — Telephone Encounter (Signed)
Last Fill: 04/22/2023  Next Visit: 11/20/2023  Last Visit: 06/19/2023  Dx: Fibromyalgia   Current Dose per office note on 06/19/2023: She has been on Cymbalta, tramadol, methocarbamol, meloxicam without getting much relief. referred to pain management at the last visit. She continues to take Cymbalta and methocarbamol. She takes tramadol on as needed basis.   Okay to refill Cymbalta?

## 2023-07-22 NOTE — Telephone Encounter (Signed)
Copied from CRM [id number]. Topic: Clinical - Medical Advice >> Jul 22, 2023 11:51 AM Elle L wrote: Reason for CRM: The patient states that she has a UTI. She used an over-the-counter test and medication but is still testing positive for high levels of leukocytes on the test. She is requesting to see if she could go to the lab for urinalysis and have medication called in instead of scheduling an appointment. Her call back number is [phone]. Her only symptom is odor as she states that the medication stopped her cramping and she has unrelated back pain.

## 2023-07-22 NOTE — Telephone Encounter (Signed)
U/A and urine culture ordered in EPIC. Patient notified

## 2023-07-23 ENCOUNTER — Other Ambulatory Visit: Payer: Self-pay

## 2023-07-23 ENCOUNTER — Other Ambulatory Visit: Payer: Self-pay | Admitting: Physician Assistant

## 2023-07-23 DIAGNOSIS — M0579 Rheumatoid arthritis with rheumatoid factor of multiple sites without organ or systems involvement: Secondary | ICD-10-CM

## 2023-07-23 DIAGNOSIS — R300 Dysuria: Secondary | ICD-10-CM | POA: Diagnosis not present

## 2023-07-23 NOTE — Progress Notes (Signed)
Specialty Pharmacy Refill Coordination Note  Karen Burton is a 47 y.o. female contacted today regarding refills of specialty medication(s) Etanercept (Enbrel Mini)   Patient requested Delivery   Delivery date: 08/02/23   Verified address: [address] [place] [address]   Medication will be filled on 08/01/23, pending refill approval.

## 2023-07-24 LAB — URINALYSIS
Bilirubin, UA: NEGATIVE
Glucose, UA: NEGATIVE
Ketones, UA: NEGATIVE
Leukocytes,UA: NEGATIVE
Nitrite, UA: NEGATIVE
Protein,UA: NEGATIVE
RBC, UA: NEGATIVE
Specific Gravity, UA: 1.019 (ref 1.005–1.030)
Urobilinogen, Ur: 0.2 mg/dL (ref 0.2–1.0)
pH, UA: 6.5 (ref 5.0–7.5)

## 2023-07-25 ENCOUNTER — Other Ambulatory Visit: Payer: Self-pay

## 2023-07-25 ENCOUNTER — Other Ambulatory Visit (HOSPITAL_COMMUNITY): Payer: Self-pay

## 2023-07-25 LAB — URINE CULTURE

## 2023-07-25 MED ORDER — ENBREL MINI 50 MG/ML ~~LOC~~ SOCT
50.0000 mg | SUBCUTANEOUS | 0 refills | Status: DC
  Filled 2023-07-25: qty 4, 28d supply, fill #0
  Filled 2023-08-27: qty 4, 28d supply, fill #1
  Filled 2023-09-27: qty 4, 28d supply, fill #2

## 2023-07-25 NOTE — Telephone Encounter (Signed)
Last Fill: 05/06/2023  Labs: 06/19/2023 CBC and CMP were normal except liver function ALT is mildly elevated.   TB Gold: 06/19/2023 Neg    Next Visit: 11/20/2023  Last Visit: 06/19/2023  DX: Rheumatoid arthritis involving multiple sites with positive rheumatoid factor   Current Dose per office note 06/19/2023: Enbrel 50 mg sq injection once weekly   Okay to refill Enbrel?

## 2023-08-01 ENCOUNTER — Other Ambulatory Visit: Payer: Self-pay

## 2023-08-17 ENCOUNTER — Other Ambulatory Visit (HOSPITAL_COMMUNITY): Payer: Self-pay

## 2023-08-21 ENCOUNTER — Other Ambulatory Visit (HOSPITAL_COMMUNITY): Payer: Self-pay

## 2023-08-22 ENCOUNTER — Other Ambulatory Visit: Payer: Self-pay

## 2023-08-22 ENCOUNTER — Other Ambulatory Visit: Payer: Self-pay | Admitting: Rheumatology

## 2023-08-22 MED ORDER — METHOCARBAMOL 500 MG PO TABS
500.0000 mg | ORAL_TABLET | Freq: Two times a day (BID) | ORAL | 0 refills | Status: DC | PRN
  Filled 2023-08-22: qty 30, 15d supply, fill #0

## 2023-08-22 NOTE — Telephone Encounter (Signed)
Last Fill: 11/09/2022  Next Visit: 11/20/2023  Last Visit: 06/19/2023  Dx: Fibromyalgia   Current Dose per office note on 06/19/2023: not discussed   Okay to refill Methocarbamol?

## 2023-08-27 ENCOUNTER — Other Ambulatory Visit: Payer: Self-pay

## 2023-08-27 NOTE — Progress Notes (Signed)
Specialty Pharmacy Refill Coordination Note  Karen Burton is a 48 y.o. female contacted today regarding refills of specialty medication(s) Etanercept (Enbrel Mini)   Patient requested Delivery   Delivery date: 09/04/23   Verified address: [address] [place] [address]   Medication will be filled on 09/03/23.

## 2023-08-28 ENCOUNTER — Encounter: Payer: Commercial Managed Care - PPO | Attending: Family Medicine | Admitting: Nutrition

## 2023-08-28 VITALS — Ht 65.0 in | Wt 224.5 lb

## 2023-08-28 DIAGNOSIS — I10 Essential (primary) hypertension: Secondary | ICD-10-CM | POA: Insufficient documentation

## 2023-08-28 DIAGNOSIS — E669 Obesity, unspecified: Secondary | ICD-10-CM | POA: Insufficient documentation

## 2023-08-28 NOTE — Patient Instructions (Signed)
Goals  Continue to focus on not eating out but 1 per week. Do the [email or web address].program. Contact  [facility] for counseling Walk 30 minutes on days off of work. Lose 1/2 lb per week. Try to cut down on use of sleep aids and try other relaxation techniques to fall asleep.

## 2023-08-28 NOTE — Progress Notes (Unsigned)
Medical Nutrition Therapy [phone]  Primary concerns today: Obesity  Referral diagnosis: E66.9 Preferred learning style: No Preference  Learning readiness: Ready   NUTRITION ASSESSMENT [employer] Employee Visit 2    [phone] Follow up CHanging: Has cut down on eating out.     48 yr old bfemale here for obesity. PMH: RA, HTN and Fibromyalgia. She is concerend about her recent gaining weight and wants toto eat better, Family history, HTN, and RA- Dx 11 yrs. Recently cut out sodas. Drinks some water. Drinks cranberry juice due to recent UTI. Has had back issues.   Did water therapy in the past. [facility]. Sees a pain management doctor.  She is willing work on lifestylel medicine to improve her health and improve or reverse some of her chronic disease.  Clinical Medical Hx:  Past Medical History:  Diagnosis Date   Anemia    Dysmenorrhea 12/20/2015   Eczema    mild   Fibroids 12/20/2015   Fibromyalgia    GERD (gastroesophageal reflux disease)    Hypertension    Menorrhagia with regular cycle 12/20/2015   PONV (postoperative nausea and vomiting)    Rash    Reflux    Rheumatoid arthritis (HCC)    Rhinitis    chonic   Vitamin D deficiency    Vulvar itching 12/20/2015    Medications:  Current Outpatient Medications on File Prior to Visit  Medication Sig Dispense Refill   amLODipine (NORVASC) 5 MG tablet Take 1 tablet (5 mg total) by mouth daily for blood pressure. 90 tablet 3   BIOTIN PO Take by mouth daily.     Cholecalciferol (VITAMIN D-3 PO) Take by mouth daily.     diclofenac (VOLTAREN) 50 MG EC tablet Take 1 tablet (50 mg total) by mouth 2 (two) times daily with a meal. Do not take with other NSAIDs. 60 tablet 2   docusate sodium (COLACE) 100 MG capsule Take 100 mg by mouth daily.      DULoxetine (CYMBALTA) 60 MG capsule Take 1 capsule (60 mg total) by mouth daily. 30 capsule 2   etanercept (ENBREL MINI) 50  MG/ML injection Inject 50 mg into the skin once a week. 12 mL 0   fluticasone (FLONASE) 50 MCG/ACT nasal spray Place 1 spray into both nostrils 2 (two) times daily. (Patient not taking: Reported on 07/15/2023) 16 g 2   hydrochlorothiazide (HYDRODIURIL) 12.5 MG tablet Take 1 tablet (12.5 mg total) by mouth daily. 90 tablet 3   losartan (COZAAR) 50 MG tablet Take 1 tablet (50 mg total) by mouth daily. 90 tablet 3   methocarbamol (ROBAXIN) 500 MG tablet Take 1 tablet (500 mg total) by mouth 2 (two) times daily as needed for muscle spasms. 30 tablet 0   Multiple Vitamins-Minerals (MULTIVITAMIN WOMEN PO) Take 1 tablet by mouth daily.     nitrofurantoin, macrocrystal-monohydrate, (MACROBID) 100 MG capsule Take 1 capsule (100 mg total) by mouth 2 (two) times daily. 14 capsule 0   Norethindrone Acetate-Ethinyl Estrad-FE (JUNEL FE 24) 1-20 MG-MCG(24) tablet TAKE 1 TABLET BY MOUTH ONCE DAILY (USE NDC [phone]) 84 tablet 4   pregabalin (LYRICA) 50 MG capsule Take 1 capsule (50 mg total) by mouth 2 (two) times daily. 60 capsule 5   traMADol (ULTRAM) 50 MG tablet Take 1 tablet (50 mg total) by mouth every 8 (eight) hours as needed for severe pain (pain score 7-10). 15 tablet 0   traZODone (  DESYREL) 50 MG tablet Take 1/2 tablet (25 mg total) by mouth at bedtime as needed. 30 tablet 1   tretinoin (RETIN-A) 0.025 % cream Apply pea size amount to affected regions 2-3 x a week building up to every night as tolerated. (Patient not taking: Reported on 06/19/2023) 20 g 11   zolpidem (AMBIEN) 5 MG tablet Take 1 tablet (5 mg total) by mouth at bedtime as needed for sleep. 30 tablet 0   No current facility-administered medications on file prior to visit.    Labs:     Latest Ref Rng & Units 06/19/2023   12:27 PM 03/28/2023   10:25 AM 11/23/2022   10:25 AM  CMP  Glucose 65 - 99 mg/dL 86  99  95   BUN 7 - 25 mg/dL 12  12  11    Creatinine 0.50 - 0.99 mg/dL 2.44  0.10  2.72   Sodium 135 - 146 mmol/L 140  138  139    Potassium 3.5 - 5.3 mmol/L 4.3  4.0  4.2   Chloride 98 - 110 mmol/L 103  103  105   CO2 20 - 32 mmol/L 29  27  28    Calcium 8.6 - 10.2 mg/dL 9.1  9.3  8.8   Total Protein 6.1 - 8.1 g/dL 6.6  6.8  6.4   Total Bilirubin 0.2 - 1.2 mg/dL 0.3  0.4  0.2   AST 10 - 35 U/L 27  24  20    ALT 6 - 29 U/L 37  35  25    Lipid Panel     Component Value Date/Time   CHOL 139 03/06/2022 0915   TRIG 134 03/06/2022 0915   HDL 50 03/06/2022 0915   CHOLHDL 2.8 03/06/2022 0915   LDLCALC 66 03/06/2022 0915   LABVLDL 23 03/06/2022 0915   Lab Results  Component Value Date   HGBA1C 5.5 03/06/2022    Notable Signs/Symptoms: Tired, fatigue, poor energy  Lifestyle & Dietary Hx Lives with his mom and sister. Bedtime 12 mignight  Estimated daily fluid intake: 30 oz Supplements: VIt D Sleep: 5-6 hres due to back pain Stress / self-care: takes for her mom who is no ambulatory  Current average weekly physical activity: ADL, use to walk 1-1/2; gets in 12,000 steps.  24-Hr Dietary Recall First Meal: Skips meals  Snack:  Second Meal: salad from [employer], orange, water Third Meal:  Chili beans and cornbread, Snack:  Beverages: water  Estimated Energy Needs Calories: 1200 Carbohydrate: 135g Protein: 90g Fat: 33g   NUTRITION DIAGNOSIS  NB-1.1 Food and nutrition-related knowledge deficit As related to High calorie high fat diet.  As evidenced by BMI 37.   NUTRITION INTERVENTION  Nutrition education (E-1) on the following topics:  Nutrition  education provided on My Plate, CHO counting, meal planning, portion sizes, timing of meals, avoiding snacks between meals benefits of exercising 30 minutes per day and prevention of complications of DM. Nutrition and Diabetes education provided on My Plate, CHO counting, meal planning, portion sizes, timing of meals, avoiding snacks between meals unless having a low blood sugar, target ranges for A1C and blood sugars, signs/symptoms and treatment of  hyper/hypoglycemia, monitoring blood sugars, taking medications as prescribed, benefits of exercising 30 minutes per day and prevention of complications of DM.   Handouts Provided Include  Lifestyle Medicine handouts  Learning Style & Readiness for Change Teaching method utilized: Visual & Auditory  Demonstrated degree of understanding via: Teach Back  Barriers to learning/adherence to lifestyle change:  none  Goals Established by Pt Goals  Reduce eating out to twice a week. Reduce bacon or sausage to 1 piece to no more than 3 times per week. Eat 1 piece of fruit with each meals. Will aim to eat 1 vegetable with lunch and dinner daily.l Will drink 3 bottles of water per day.    MONITORING & EVALUATION Dietary intake, weekly physical activity, and weight in 1 month.  Next Steps  Patient is to work .

## 2023-08-29 ENCOUNTER — Encounter: Payer: Self-pay | Admitting: Nutrition

## 2023-09-11 ENCOUNTER — Other Ambulatory Visit (HOSPITAL_COMMUNITY): Payer: Self-pay

## 2023-09-13 ENCOUNTER — Encounter: Payer: Self-pay | Admitting: Physical Medicine & Rehabilitation

## 2023-09-13 ENCOUNTER — Encounter
Payer: Commercial Managed Care - PPO | Attending: Physical Medicine & Rehabilitation | Admitting: Physical Medicine & Rehabilitation

## 2023-09-13 VITALS — BP 135/89 | HR 85 | Ht 65.0 in | Wt 224.0 lb

## 2023-09-13 DIAGNOSIS — M797 Fibromyalgia: Secondary | ICD-10-CM | POA: Diagnosis not present

## 2023-09-13 DIAGNOSIS — M0579 Rheumatoid arthritis with rheumatoid factor of multiple sites without organ or systems involvement: Secondary | ICD-10-CM | POA: Diagnosis not present

## 2023-09-13 DIAGNOSIS — G8929 Other chronic pain: Secondary | ICD-10-CM | POA: Insufficient documentation

## 2023-09-13 DIAGNOSIS — F39 Unspecified mood [affective] disorder: Secondary | ICD-10-CM | POA: Diagnosis not present

## 2023-09-13 DIAGNOSIS — M545 Low back pain, unspecified: Secondary | ICD-10-CM | POA: Diagnosis not present

## 2023-09-13 MED ORDER — NONFORMULARY OR COMPOUNDED ITEM: 1.0000 mg | Freq: Every day | 1 refills | Status: AC

## 2023-09-13 NOTE — Progress Notes (Unsigned)
Subjective:    Patient ID: Karen Burton, female    DOB: 02/13/1976, 48 y.o.   MRN: [id number]  HPI  HPI  Karen Burton is a 48 y.o. year old female  who  has a past medical history of Anemia, Dysmenorrhea (12/20/2015), Eczema, Fibroids (12/20/2015), Fibromyalgia, GERD (gastroesophageal reflux disease), Hypertension, Menorrhagia with regular cycle (12/20/2015), PONV (postoperative nausea and vomiting), Rash, Reflux, Rheumatoid arthritis (HCC), Rhinitis, Vitamin D deficiency, and Vulvar itching (12/20/2015).   They are presenting to PM&R clinic as a new patient for pain management evaluation. They were referred by Dr. Adriana Simas for treatment of fibromyalgia pain.  She has a history of RA and reports this was diagnosed 17 years ago.  Initially her primary location of pain was her lower back.  She later developed pain in her hips and buttocks bilaterally.  Pain in her hips is throbbing in quality.  Over time she feels as the pain has spread to other locations including her arms and legs.  Her neck and shoulders are often very painful.  She feels like her entire body is stressed and this is contributing to her pain.  She denies shooting pain down her arms or legs.  Patient reports he is generally sensitive to medications and they will often make her sleepy.  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  She also helps to take care of her mother.  After doing a lot of activity she often has to lay in bed for a few days to recover.  She reports poor sleep, sometimes takes Ambien to help this.  Reports frequent constipation with occasional diarrhea.  Reports history of IBS  Decreased mood at times.  Denies SI or HI.  She reports she has a lot of stress because she feels like the pain could prevent her from being able to care for her mother and could limit her from working.   Red flag symptoms: No red flags for back pain endorsed in Hx or ROS  Medications  tried: Topical medications- Aleve roll on  Nsaids Ibuprofen- limited by liver enzyme elevation, meloxicam- didn't help much  Tylenol  Helps a little  Opiates  Tramadol- didn't help , used this for short time, helped her sleep  Gabapentin- tried it one time  TCAs  denies  SNRIs  Cymbalta- has been on it for a while Robaxin- didn't help   Other treatments: PT - March 2024, restarted Aquatic therapy TENs unit  has not tried it before  Injections- SI joint injections- didn't help , Trigger point injections- didn't help  Dry needling didn't help   Surgery Denies joint/back surgeries    Prior UDS results: No results found for: "LABOPIA", "COCAINSCRNUR", "LABBENZ", "AMPHETMU", "THCU", "LABBARB"    Interval History 06/13/23  Karen Burton is here for follow-up regarding her chronic pain.  Patient reports that her body wide pain is much improved with aquatic therapy.  Her worst pain continues to be her lower back average about 3 out of 10.  Currently pain is 0 out of 10.  She reports too much sedation with Lyrica 75 mg twice daily but says she did not have this issue with 50 mg dose.   Interval History 09/13/23 Patient reports her pain has been worse recently.  She feels like pool exercises were helping, until there was an issue with the hot water heater at the pool she is going to.  She plans to restart this soon.  She is tired  tolerating Lyrica 50 mg daily, higher doses caused sedation.  Pain Inventory Average Pain 3 Pain Right Now 6 My pain is constant, dull, stabbing, and aching  In the last 24 hours, has pain interfered with the following? General activity 4 Relation with others 0 Enjoyment of life 4 What TIME of day is your pain at its worst? morning , daytime, evening, and night Sleep (in general) Fair  Pain is worse with: walking, bending, sitting, standing, and some activites Pain improves with: rest, heat/ice, therapy/exercise, and medication Relief from Meds:  5     Family History  Problem Relation Age of Onset  . Diabetes Mother   . Hypertension Mother   . Asthma Mother   . Cancer Mother        breast  . Thyroid disease Mother   . Arthritis Mother   . Graves' disease Mother   . Cancer Maternal Grandmother        pancreatic  . Fibroids Sister   . COPD Maternal Aunt   . Asthma Maternal Aunt   . Gout Maternal Aunt    Social History   Socioeconomic History  . Marital status: Single    Spouse name: Not on file  . Number of children: 0  . Years of education: Not on file  . Highest education level: Not on file  Occupational History  . Not on file  Tobacco Use  . Smoking status: Never    Passive exposure: Never  . Smokeless tobacco: Never  Vaping Use  . Vaping status: Never Used  Substance and Sexual Activity  . Alcohol use: No  . Drug use: Never  . Sexual activity: Not Currently    Birth control/protection: Pill  Other Topics Concern  . Not on file  Social History Narrative  . Not on file   Social Drivers of Health   Financial Resource Strain: Low Risk  (05/15/2022)   Overall Financial Resource Strain (CARDIA)   . Difficulty of Paying Living Expenses: Not hard at all  Food Insecurity: No Food Insecurity (05/15/2022)   Hunger Vital Sign   . Worried About Programme researcher, broadcasting/film/video in the Last Year: Never true   . Ran Out of Food in the Last Year: Never true  Transportation Needs: No Transportation Needs (05/15/2022)   PRAPARE - Transportation   . Lack of Transportation (Medical): No   . Lack of Transportation (Non-Medical): No  Physical Activity: Insufficiently Active (05/15/2022)   Exercise Vital Sign   . Days of Exercise per Week: 3 days   . Minutes of Exercise per Session: 30 min  Stress: Stress Concern Present (05/15/2022)   [employer] of Occupational Health - Occupational Stress Questionnaire   . Feeling of Stress : To some extent  Social Connections: Moderately Isolated (05/15/2022)   Social Connection  and Isolation Panel [NHANES]   . Frequency of Communication with Friends and Family: More than three times a week   . Frequency of Social Gatherings with Friends and Family: More than three times a week   . Attends Religious Services: More than 4 times per year   . Active Member of Clubs or Organizations: No   . Attends Banker Meetings: Never   . Marital Status: Never married   Past Surgical History:  Procedure Laterality Date  . CHOLECYSTECTOMY N/A 02/08/2022   Procedure: LAPAROSCOPIC CHOLECYSTECTOMY;  Surgeon: Lewie Chamber, DO;  Location: [facility];  Service: General;  Laterality: N/A;  . ESOPHAGOGASTRODUODENOSCOPY ENDOSCOPY    .  IR GENERIC HISTORICAL  02/07/2016   IR RADIOLOGIST EVAL & MGMT 02/07/2016 Simonne Come, MD [facility] INTERV RAD  . WISDOM TOOTH EXTRACTION     Past Medical History:  Diagnosis Date  . Anemia   . Dysmenorrhea 12/20/2015  . Eczema    mild  . Fibroids 12/20/2015  . Fibromyalgia   . GERD (gastroesophageal reflux disease)   . Hypertension   . Menorrhagia with regular cycle 12/20/2015  . PONV (postoperative nausea and vomiting)   . Rash   . Reflux   . Rheumatoid arthritis (HCC)   . Rhinitis    chonic  . Vitamin D deficiency   . Vulvar itching 12/20/2015   BP 135/89   Pulse 85   Ht 5\' 5"  (1.651 m)   Wt 224 lb (101.6 kg)   SpO2 100%   BMI 37.28 kg/m   Opioid Risk Score:   Fall Risk Score:  `1  Depression screen [facility] 2/9     08/29/2023    9:07 AM 06/13/2023   10:02 AM 05/03/2023    1:11 PM 03/28/2023    9:39 AM 09/26/2022    9:39 AM 05/15/2022    1:51 PM 02/13/2021    9:58 AM  Depression screen PHQ 2/9  Decreased Interest 3 0 3 1 1 1 1   Down, Depressed, Hopeless 3 0 2 1 1 1 1   PHQ - 2 Score 6 0 5 2 2 2 2   Altered sleeping 3  2 2 1 3 2   Tired, decreased energy 3  3 2  0 3 2  Change in appetite 1  0 0 0 0 0  Feeling bad or failure about yourself  1  1 0 1 1 1   Trouble concentrating 0  0 0 0 1 1  Moving slowly or  fidgety/restless 0  0 0 0 0 0  Suicidal thoughts 0  0 0 0 0 0  PHQ-9 Score 14  11 6 4 10 8   Difficult doing work/chores Very difficult  Somewhat difficult Somewhat difficult Somewhat difficult  Somewhat difficult     Review of Systems  Musculoskeletal:  Positive for back pain and neck pain.       Bilateral shin pain Buttocks pain Bilateral shoulder pain  All other systems reviewed and are negative.      Objective:   Physical Exam   Gen: no distress, normal appearing HEENT: oral mucosa pink and moist, NCAT Chest: normal effort, normal rate of breathing Abd: soft, non-distended Ext: no edema Psych: pleasant, appropriate Skin: intact Neuro: Awake, follows commands, cranial nerves II through XII grossly intact, normal speech and language Strength moving all 4 extremities to gravity and resistance Sensory exam normal for light touch and pain in all 4 limbs. No limb ataxia or cerebellar signs. No abnormal tone appreciated.   Musculoskeletal:  Tender to palpation throughout her C-spine, L-spine bilaterally.   TTP periscapular muscles bilaterally TTP throughout bilateral upper and lower extremities Pain with lumbar spinal extension Facet loading mildly positive  MRI L-spine 2-3 24 IMPRESSION: 1. Lumbar spondylosis and degenerative disc disease, causing mild displacement of the right L2 nerve in the lateral extraforaminal space at L2-3 and borderline right foraminal stenosis at L5-S1. 2. Large heterogeneous presacral structure believed to represent fibroid uterus, only partially included on today's exam.     Assessment & Plan:   1) Fibromyalgia  -Diagnosis of fibromyalgia being consistent with her body wide pain, mood disorder, sleep disorder, IBS history 2) Rheumatoid arthritis  -Appears to be well-controlled  continue follow-up with rheumatology 3) Mood disorder  -She declines to discuss this in further detail, denies SI or HI 4) Chronic right lower back pain without  sciatica.  Lumbar spondylosis noted on recent MRI  -Continue duloxetine 60 mg daily -Continue as needed Tylenol -Patient improvement with aquatic therapy.  Discussed continuing exercises.  She is doing this in a [facility] pool.  Provided letter stating that I think she would benefit from continued exercises in the pool.  Provided new letter today 2/14 -Zynex nexwave ordered prior visit -Patient had too much sedation with Lyrica 75 mg.  Continue Lyrica 50 mg twice daily.  Could try Lyrica 50 mg 3 times daily if needed at a later time. -Discussed foods that could be helpful for pain prior visit -Discussed gradual progression of low impact exercise -Patient appears to have a lot of pain with spinal extension facet loading, MRI L-spine with evidence of L2-3, L3-4 facet arthropathy.  Consider referral for medial branch block/RFA at a later time -Yoga and Tai Chi recommended -Discussed trying TENS, Zynex expensive however she could try OTC item -Low dose naltrexone 1mg  [facility], 1 refill

## 2023-09-18 ENCOUNTER — Other Ambulatory Visit (HOSPITAL_COMMUNITY): Payer: Self-pay

## 2023-09-24 ENCOUNTER — Other Ambulatory Visit: Payer: Self-pay

## 2023-09-27 ENCOUNTER — Other Ambulatory Visit: Payer: Self-pay

## 2023-09-27 ENCOUNTER — Ambulatory Visit: Payer: Commercial Managed Care - PPO | Admitting: Family Medicine

## 2023-09-27 ENCOUNTER — Other Ambulatory Visit (HOSPITAL_COMMUNITY): Payer: Self-pay

## 2023-09-27 VITALS — BP 127/85 | HR 101 | Temp 98.1°F | Ht 65.0 in | Wt 225.0 lb

## 2023-09-27 DIAGNOSIS — I10 Essential (primary) hypertension: Secondary | ICD-10-CM | POA: Diagnosis not present

## 2023-09-27 DIAGNOSIS — M797 Fibromyalgia: Secondary | ICD-10-CM | POA: Diagnosis not present

## 2023-09-27 NOTE — Progress Notes (Signed)
 Specialty Pharmacy Refill Coordination Note  Karen Burton is a 48 y.o. female contacted today regarding refills of specialty medication(s) Etanercept (Enbrel Mini)   Patient requested Delivery   Delivery date: 10/04/23   Verified address: [address]   [place] [address]   Medication will be filled on 10/03/23.

## 2023-09-27 NOTE — Patient Instructions (Signed)
 Continue your medications.  Follow up in 6 months.  Take care  Dr. Adriana Simas

## 2023-09-30 ENCOUNTER — Encounter: Payer: Commercial Managed Care - PPO | Attending: Family Medicine | Admitting: Nutrition

## 2023-09-30 ENCOUNTER — Other Ambulatory Visit: Payer: Self-pay

## 2023-09-30 ENCOUNTER — Encounter: Payer: Self-pay | Admitting: Nutrition

## 2023-09-30 VITALS — Ht 65.0 in | Wt 224.0 lb

## 2023-09-30 DIAGNOSIS — E669 Obesity, unspecified: Secondary | ICD-10-CM | POA: Insufficient documentation

## 2023-09-30 DIAGNOSIS — I10 Essential (primary) hypertension: Secondary | ICD-10-CM | POA: Insufficient documentation

## 2023-09-30 NOTE — Progress Notes (Signed)
 Subjective:  Patient ID: Karen Burton, female    DOB: 03-19-1976  Age: 48 y.o. MRN: [id number]  CC:   Chief Complaint  Patient presents with   Hypertension    Had chills last night, drainage and cough yesterday , some chest pressure     HPI:  48 year old female with the below mentioned medical problems presents for follow-up.  Hypertension is stable on amlodipine, losartan, and hydrochlorothiazide.  Patient reports that she has had cough as of yesterday and some associated chills.  No fever.  Patient is frustrated with chronic pain.  She follows with pain management.  She is on Lyrica, tramadol, methocarbamol, duloxetine.  Recent addition of naltrexone.  Recent note from pain medicine physician reviewed from 2/14.  Patient overdue for mammogram.  Advised to have this done.  Patient Active Problem List   Diagnosis Date Noted   Chronic bilateral low back pain 03/28/2023   Obesity (BMI 30-39.9) 03/28/2023   Healthcare maintenance 09/26/2022   Insomnia 09/06/2021   Encounter for surveillance of contraceptive pills 08/12/2017   Fibromyalgia 11/06/2016   Uterine leiomyoma 12/20/2015   Essential hypertension 01/12/2013   Rheumatoid arthritis (HCC) 01/12/2013    Social Hx   Social History   Socioeconomic History   Marital status: Single    Spouse name: Not on file   Number of children: 0   Years of education: Not on file   Highest education level: Not on file  Occupational History   Not on file  Tobacco Use   Smoking status: Never    Passive exposure: Never   Smokeless tobacco: Never  Vaping Use   Vaping status: Never Used  Substance and Sexual Activity   Alcohol use: No   Drug use: Never   Sexual activity: Not Currently    Birth control/protection: Pill  Other Topics Concern   Not on file  Social History Narrative   Not on file   Social Drivers of Health   Financial Resource Strain: Low Risk  (05/15/2022)   Overall Financial Resource Strain  (CARDIA)    Difficulty of Paying Living Expenses: Not hard at all  Food Insecurity: No Food Insecurity (05/15/2022)   Hunger Vital Sign    Worried About Running Out of Food in the Last Year: Never true    Ran Out of Food in the Last Year: Never true  Transportation Needs: No Transportation Needs (05/15/2022)   PRAPARE - Administrator, Civil Service (Medical): No    Lack of Transportation (Non-Medical): No  Physical Activity: Insufficiently Active (05/15/2022)   Exercise Vital Sign    Days of Exercise per Week: 3 days    Minutes of Exercise per Session: 30 min  Stress: Stress Concern Present (05/15/2022)   [employer] of Occupational Health - Occupational Stress Questionnaire    Feeling of Stress : To some extent  Social Connections: Moderately Isolated (05/15/2022)   Social Connection and Isolation Panel [NHANES]    Frequency of Communication with Friends and Family: More than three times a week    Frequency of Social Gatherings with Friends and Family: More than three times a week    Attends Religious Services: More than 4 times per year    Active Member of [employer] or Organizations: No    Attends Banker Meetings: Never    Marital Status: Never married    Review of Systems Per HPI  Objective:  BP 127/85   Pulse (!) 101   Temp 98.1  F (36.7 C)   Ht 5\' 5"  (1.651 m)   Wt 225 lb (102.1 kg)   SpO2 98%   BMI 37.44 kg/m      09/30/2023    9:03 AM 09/27/2023    9:25 AM 09/13/2023   10:33 AM  BP/Weight  Systolic BP  127 440  Diastolic BP  85 89  Wt. (Lbs) 224 225 224  BMI 37.28 kg/m2 37.44 kg/m2 37.28 kg/m2    Physical Exam Vitals and nursing note reviewed.  Constitutional:      General: She is not in acute distress.    Appearance: She is obese.  HENT:     Head: Normocephalic and atraumatic.  Cardiovascular:     Rate and Rhythm: Normal rate and regular rhythm.  Pulmonary:     Effort: Pulmonary effort is normal.     Breath sounds: Normal  breath sounds. No wheezing or rales.  Neurological:     Mental Status: She is alert.  Psychiatric:        Mood and Affect: Mood normal.        Behavior: Behavior normal.     Lab Results  Component Value Date   WBC 4.9 06/19/2023   HGB 13.2 06/19/2023   HCT 39.9 06/19/2023   PLT 243 06/19/2023   GLUCOSE 86 06/19/2023   CHOL 139 03/06/2022   TRIG 134 03/06/2022   HDL 50 03/06/2022   LDLCALC 66 03/06/2022   ALT 37 (H) 06/19/2023   AST 27 06/19/2023   NA 140 06/19/2023   K 4.3 06/19/2023   CL 103 06/19/2023   CREATININE 0.89 06/19/2023   BUN 12 06/19/2023   CO2 29 06/19/2023   TSH 3.190 03/06/2022   HGBA1C 5.5 03/06/2022     Assessment & Plan:  Essential hypertension Assessment & Plan: Stable.  Continue Norvasc, losartan, and HCTZ.   Fibromyalgia Assessment & Plan: Note from pain management physician reviewed.  Advised continued follow-up.  Advised to give naltrexone some time to work.    Follow-up:  Return in about 6 months (around 03/26/2024).  Everlene Other DO [facility]

## 2023-09-30 NOTE — Patient Instructions (Signed)
 Goals  Work on cutting out processed foods Work on [employer] and meal planning. Get back to the [facility] and do yoga and swim twice a week.

## 2023-09-30 NOTE — Assessment & Plan Note (Signed)
 Note from pain management physician reviewed.  Advised continued follow-up.  Advised to give naltrexone some time to work.

## 2023-09-30 NOTE — Assessment & Plan Note (Signed)
 Stable.  Continue Norvasc, losartan, and HCTZ.

## 2023-09-30 NOTE — Progress Notes (Signed)
 Medical Nutrition Therapy 0900 -0930 [employer]  Primary concerns today: Obesity  Referral diagnosis: E66.9 Preferred learning style: No Preference  Learning readiness: Ready   NUTRITION ASSESSMENT [employer] Visit 2    [phone] Follow up CHanging: Planning to go to [facility] to do water exercises. Weight is the same. She feels better. Digestion is better. Being more intentional with foods and starting to exercise.  Admits she may have some depressed feelings but not wanting to harm herself or anyone else. Has no interest in doing things that she use to enjoy. Encouraged her to reach out to her PCP to discuss referral for counseling or EACP.Marland Kitchen  She is willing work on lifestylel medicine to improve her health and improve or reverse some of her chronic disease.  Clinical Medical Hx:  Past Medical History:  Diagnosis Date   Anemia    Dysmenorrhea 12/20/2015   Eczema    mild   Fibroids 12/20/2015   Fibromyalgia    GERD (gastroesophageal reflux disease)    Hypertension    Menorrhagia with regular cycle 12/20/2015   PONV (postoperative nausea and vomiting)    Rash    Reflux    Rheumatoid arthritis (HCC)    Rhinitis    chonic   Vitamin D deficiency    Vulvar itching 12/20/2015    Medications:  Current Outpatient Medications on File Prior to Visit  Medication Sig Dispense Refill   amLODipine (NORVASC) 5 MG tablet Take 1 tablet (5 mg total) by mouth daily for blood pressure. 90 tablet 3   BIOTIN PO Take by mouth daily.     Cholecalciferol (VITAMIN D-3 PO) Take by mouth daily.     diclofenac (VOLTAREN) 50 MG EC tablet Take 1 tablet (50 mg total) by mouth 2 (two) times daily with a meal. Do not take with other NSAIDs. 60 tablet 2   docusate sodium (COLACE) 100 MG capsule Take 100 mg by mouth daily.      DULoxetine (CYMBALTA) 60 MG capsule Take 1 capsule (60 mg total) by mouth daily. 30 capsule 2   etanercept (ENBREL MINI) 50 MG/ML injection Inject 50 mg into the skin once a week.  12 mL 0   fluticasone (FLONASE) 50 MCG/ACT nasal spray Place 1 spray into both nostrils 2 (two) times daily. 16 g 2   hydrochlorothiazide (HYDRODIURIL) 12.5 MG tablet Take 1 tablet (12.5 mg total) by mouth daily. 90 tablet 3   losartan (COZAAR) 50 MG tablet Take 1 tablet (50 mg total) by mouth daily. 90 tablet 3   methocarbamol (ROBAXIN) 500 MG tablet Take 1 tablet (500 mg total) by mouth 2 (two) times daily as needed for muscle spasms. 30 tablet 0   Multiple Vitamins-Minerals (MULTIVITAMIN WOMEN PO) Take 1 tablet by mouth daily.     NONFORMULARY OR COMPOUNDED ITEM Take 1 mg by mouth daily. Naltrexone 100 each 1   Norethindrone Acetate-Ethinyl Estrad-FE (JUNEL FE 24) 1-20 MG-MCG(24) tablet TAKE 1 TABLET BY MOUTH ONCE DAILY (USE NDC [phone]) 84 tablet 4   pregabalin (LYRICA) 50 MG capsule Take 1 capsule (50 mg total) by mouth 2 (two) times daily. 60 capsule 5   traMADol (ULTRAM) 50 MG tablet Take 1 tablet (50 mg total) by mouth every 8 (eight) hours as needed for severe pain (pain score 7-10). 15 tablet 0   traZODone (DESYREL) 50 MG tablet Take 1/2 tablet (25 mg total) by mouth at bedtime as needed. 30 tablet 1   tretinoin (RETIN-A) 0.025 % cream Apply pea size  amount to affected regions 2-3 x a week building up to every night as tolerated. 20 g 11   zolpidem (AMBIEN) 5 MG tablet Take 1 tablet (5 mg total) by mouth at bedtime as needed for sleep. 30 tablet 0   No current facility-administered medications on file prior to visit.    Labs:     Latest Ref Rng & Units 06/19/2023   12:27 PM 03/28/2023   10:25 AM 11/23/2022   10:25 AM  CMP  Glucose 65 - 99 mg/dL 86  99  95   BUN 7 - 25 mg/dL 12  12  11    Creatinine 0.50 - 0.99 mg/dL 1.61  0.96  0.45   Sodium 135 - 146 mmol/L 140  138  139   Potassium 3.5 - 5.3 mmol/L 4.3  4.0  4.2   Chloride 98 - 110 mmol/L 103  103  105   CO2 20 - 32 mmol/L 29  27  28    Calcium 8.6 - 10.2 mg/dL 9.1  9.3  8.8   Total Protein 6.1 - 8.1 g/dL 6.6  6.8  6.4    Total Bilirubin 0.2 - 1.2 mg/dL 0.3  0.4  0.2   AST 10 - 35 U/L 27  24  20    ALT 6 - 29 U/L 37  35  25    Lipid Panel     Component Value Date/Time   CHOL 139 03/06/2022 0915   TRIG 134 03/06/2022 0915   HDL 50 03/06/2022 0915   CHOLHDL 2.8 03/06/2022 0915   LDLCALC 66 03/06/2022 0915   LABVLDL 23 03/06/2022 0915   Lab Results  Component Value Date   HGBA1C 5.5 03/06/2022    Notable Signs/Symptoms: Tired, fatigue, poor energy  Lifestyle & Dietary Hx Lives with his mom and sister. Bedtime 12 mignight  Estimated daily fluid intake: 30 oz Supplements: VIt D Sleep: 5-6 hres due to back pain Stress / self-care: takes for her mom who is no ambulatory  Current average weekly physical activity: ADL, use to walk 1-1/2; gets in 12,000 steps.  24-Hr Dietary Recall First Meal: Skips meals  Snack:  Second Meal: salad from [employer], orange, water Third Meal:  Chili beans and cornbread, Snack:  Beverages: water  Estimated Energy Needs Calories: 1200 Carbohydrate: 135g Protein: 90g Fat: 33g   NUTRITION DIAGNOSIS  NB-1.1 Food and nutrition-related knowledge deficit As related to High calorie high fat diet.  As evidenced by BMI 37.   NUTRITION INTERVENTION  Nutrition education (E-1) on the following topics:  Nutrition  education provided on My Plate, CHO counting, meal planning, portion sizes, timing of meals, avoiding snacks between meals benefits of exercising 30 minutes per day and prevention of complications of DM. Nutrition and Diabetes education provided on My Plate, CHO counting, meal planning, portion sizes, timing of meals, avoiding snacks between meals unless having a low blood sugar, target ranges for A1C and blood sugars, signs/symptoms and treatment of hyper/hypoglycemia, monitoring blood sugars, taking medications as prescribed, benefits of exercising 30 minutes per day and prevention of complications of DM.   Handouts Provided Include  Lifestyle Medicine  handouts  Learning Style & Readiness for Change Teaching method utilized: Visual & Auditory  Demonstrated degree of understanding via: Teach Back  Barriers to learning/adherence to lifestyle change: none  Goals Established by Pt Goals Goals  Work on cutting out processed foods Work on meal prepping and meal planning. Get back to the [facility] and do yoga and swim twice a week.  Do the Full Plate online program.  MONITORING & EVALUATION Dietary intake, weekly physical activity, and weight in 3 month.  Next Steps  Patient is to work on seeing a therapist for her depressed symptoms.Marland Kitchen

## 2023-10-03 ENCOUNTER — Other Ambulatory Visit: Payer: Self-pay

## 2023-10-09 ENCOUNTER — Encounter: Payer: Self-pay | Admitting: Nutrition

## 2023-10-16 ENCOUNTER — Other Ambulatory Visit (HOSPITAL_COMMUNITY): Payer: Self-pay

## 2023-10-16 ENCOUNTER — Other Ambulatory Visit: Payer: Self-pay | Admitting: Rheumatology

## 2023-10-16 ENCOUNTER — Other Ambulatory Visit: Payer: Self-pay

## 2023-10-16 MED ORDER — DULOXETINE HCL 60 MG PO CPEP
60.0000 mg | ORAL_CAPSULE | Freq: Every day | ORAL | 2 refills | Status: DC
  Filled 2023-10-16: qty 30, 30d supply, fill #0
  Filled 2023-11-19: qty 30, 30d supply, fill #1
  Filled 2023-12-25: qty 30, 30d supply, fill #2

## 2023-10-16 NOTE — Telephone Encounter (Signed)
 Last Fill: 07/22/2023  Next Visit: 11/20/2023  Last Visit: 06/19/2023  Dx: Rheumatoid arthritis involving multiple sites with positive rheumatoid factor   Current Dose per office note on 06/19/2023: She continues to take Cymbalta   Okay to refill Cymbalta?

## 2023-11-01 ENCOUNTER — Other Ambulatory Visit: Payer: Self-pay | Admitting: Physician Assistant

## 2023-11-01 ENCOUNTER — Other Ambulatory Visit: Payer: Self-pay

## 2023-11-01 DIAGNOSIS — M0579 Rheumatoid arthritis with rheumatoid factor of multiple sites without organ or systems involvement: Secondary | ICD-10-CM

## 2023-11-01 NOTE — Progress Notes (Addendum)
 Specialty Pharmacy Refill Coordination Note  Karen Burton is a 48 y.o. female contacted today regarding refills of specialty medication(s) Etanercept (Enbrel Mini)   Patient requested Delivery   Delivery date: 11/12/23   Verified address: [address]   [place]  [address]   Medication will be filled on 04.14.25.   This fill date is pending response to refill request from provider. Patient is aware and if they have not received fill by intended date they must follow up with pharmacy.

## 2023-11-01 NOTE — Telephone Encounter (Signed)
 Last Fill: 07/25/2023  Labs: 06/19/2023 CBC and CMP were normal except liver function ALT is mildly elevated.  Patient should avoid all NSAIDs and alcohol use.  She has a prescription for diclofenac. [facility] labs are due.  TB Gold: 06/19/2023   TB Gold is negative.   Next Visit: 11/20/2023  Last Visit: 06/19/2023  DX: Rheumatoid arthritis involving multiple sites with positive rheumatoid factor   Current Dose per office note 06/19/2023: Enbrel 50 mg sq injection once weekly.   Okay to refill Enbrel?

## 2023-11-02 MED ORDER — ENBREL MINI 50 MG/ML ~~LOC~~ SOCT
50.0000 mg | SUBCUTANEOUS | 0 refills | Status: DC
  Filled 2023-11-04: qty 4, 28d supply, fill #0

## 2023-11-04 ENCOUNTER — Other Ambulatory Visit: Payer: Self-pay

## 2023-11-04 ENCOUNTER — Other Ambulatory Visit (HOSPITAL_COMMUNITY): Payer: Self-pay

## 2023-11-06 NOTE — Progress Notes (Unsigned)
 Office Visit Note  Patient: Karen Burton             Date of Birth: [birth year]           MRN: [id number]             PCP: Cook, Jayce G, DO Referring: Cook, Jayce G, DO Visit Date: 11/20/2023 Occupation: @GUAROCC @  Subjective:  Generalized pain   History of Present Illness: Karen Burton is a 48 y.o. female with history of seropositive rheumatoid arthritis and fibromyalgia.  Patient is currently taking  Enbrel  50 mg sq injection once weekly.  She is tolerating Enbrel  without any side effects or injection site reactions.  In mid March the patient was about 10 days overdue for her Enbrel  injection due to her aunt passing away.  She has not had any other recent gaps in therapy.  Patient has been under increased stress as she has been grieving the loss of her aunt which she feels has contributed to her generalized pain.  She continues to have ongoing discomfort in her lower back.  She has been using a heating pad as well as taking methocarbamol  as needed for muscle spasms.  She remains under the care of pain management and is prescribed naltrexone, tramadol , and Lyrica  for symptomatic relief.  She remains on Cymbalta  as prescribed as well.  Patient was previously going to water aerobics once a week which she felt was helping to alleviate her myofascial pain and lower back pain.  She is planning on resuming water aerobics. Patient's been experiencing interrupted sleep at night due to discomfort in her lower back as well as a recurrence of carpal tunnel symptoms involving both hands.  Patient has misplaced her carpal tunnel night splints that plans to purchase new ones this she cannot find her old splints.   Activities of Daily Living:  Patient reports morning stiffness for 30-60 minutes.   Patient Reports nocturnal pain.  Difficulty dressing/grooming: Denies Difficulty climbing stairs: Denies Difficulty getting out of chair: Denies Difficulty using hands for taps, buttons,  cutlery, and/or writing: Reports  Review of Systems  Constitutional:  Positive for fatigue.  HENT:  Negative for mouth sores and mouth dryness.   Eyes:  Positive for dryness.  Respiratory:  Negative for shortness of breath.   Cardiovascular:  Negative for chest pain and palpitations.  Gastrointestinal:  Positive for constipation. Negative for blood in stool and diarrhea.  Endocrine: Negative for increased urination.  Genitourinary:  Negative for involuntary urination.  Musculoskeletal:  Positive for joint pain, joint pain, joint swelling, myalgias, morning stiffness, muscle tenderness and myalgias. Negative for gait problem and muscle weakness.  Skin:  Positive for color change and sensitivity to sunlight. Negative for rash and hair loss.  Allergic/Immunologic: Positive for susceptible to infections.  Neurological:  Negative for dizziness and headaches.  Hematological:  Negative for swollen glands.  Psychiatric/Behavioral:  Positive for depressed mood and sleep disturbance. The patient is not nervous/anxious.     PMFS History:  Patient Active Problem List   Diagnosis Date Noted   Chronic bilateral low back pain 03/28/2023   Obesity (BMI 30-39.9) 03/28/2023   Healthcare maintenance 09/26/2022   Insomnia 09/06/2021   Encounter for surveillance of contraceptive pills 08/12/2017   Fibromyalgia 11/06/2016   Uterine leiomyoma 12/20/2015   Essential hypertension 01/12/2013   Rheumatoid arthritis (HCC) 01/12/2013    Past Medical History:  Diagnosis Date   Anemia    Dysmenorrhea 12/20/2015   Eczema    mild  Fibroids 12/20/2015   Fibromyalgia    GERD (gastroesophageal reflux disease)    Hypertension    Menorrhagia with regular cycle 12/20/2015   PONV (postoperative nausea and vomiting)    Rash    Reflux    Rheumatoid arthritis (HCC)    Rhinitis    chonic   Vitamin D deficiency    Vulvar itching 12/20/2015    Family History  Problem Relation Age of Onset   Diabetes Mother     Hypertension Mother    Asthma Mother    Cancer Mother        breast   Thyroid  disease Mother    Arthritis Mother    Murrell Arrant' disease Mother    Fibroids Sister    Cancer Maternal Grandmother        pancreatic   COPD Maternal Aunt    Asthma Maternal Aunt    Gout Maternal Aunt    Past Surgical History:  Procedure Laterality Date   CHOLECYSTECTOMY N/A 02/08/2022   Procedure: LAPAROSCOPIC CHOLECYSTECTOMY;  Surgeon: Marijo Shove, DO;  Location: [facility];  Service: General;  Laterality: N/A;   ESOPHAGOGASTRODUODENOSCOPY ENDOSCOPY     IR GENERIC HISTORICAL  02/07/2016   IR RADIOLOGIST EVAL & MGMT 02/07/2016 Robbi Childs, MD [facility] INTERV RAD   WISDOM TOOTH EXTRACTION     Social History   Social History Narrative   Not on file   Immunization History  Administered Date(s) Administered   DT (Pediatric) 02/20/2013   Influenza-Unspecified 07/18/2009, 05/16/2016, 06/03/2017, 04/07/2018, 04/27/2021   PFIZER(Purple Top)SARS-COV-2 Vaccination 04/01/2020, 04/22/2020   Pneumococcal Polysaccharide-23 05/08/2007     Objective: Vital Signs: BP 132/87 (BP Location: Left Arm, Patient Position: Sitting, Cuff Size: Large)   Pulse 80   Resp 14   Ht 5\' 5"  (1.651 m)   Wt 218 lb (98.9 kg)   LMP 08/11/2023   BMI 36.28 kg/m    Physical Exam Vitals and nursing note reviewed.  Constitutional:      Appearance: She is well-developed.  HENT:     Head: Normocephalic and atraumatic.  Eyes:     Conjunctiva/sclera: Conjunctivae normal.  Cardiovascular:     Rate and Rhythm: Normal rate and regular rhythm.     Heart sounds: Normal heart sounds.  Pulmonary:     Effort: Pulmonary effort is normal.     Breath sounds: Normal breath sounds.  Abdominal:     General: Bowel sounds are normal.     Palpations: Abdomen is soft.  Musculoskeletal:     Cervical back: Normal range of motion.  Lymphadenopathy:     Cervical: No cervical adenopathy.  Skin:    General: Skin is warm and dry.      Capillary Refill: Capillary refill takes less than 2 seconds.  Neurological:     Mental Status: She is alert and oriented to person, place, and time.  Psychiatric:        Behavior: Behavior normal.      Musculoskeletal Exam: Generalized hyperalgesia and positive tender points on acute exam.  C-spine has limited range of motion with lateral rotation.  Trapezius muscle tension tenderness bilaterally.  Limited mobility of the lumbar spine.  Shoulder joints, elbow joints, wrist joints, MCPs, PIPs, DIPs have good range of motion with no synovitis.  Complete fist formation bilaterally.  Hip joints have good range of motion with discomfort bilaterally.  Tenderness over the trochanteric bursa bilaterally.  Knee joints have good range of motion with no warmth or effusion.  Ankle joints have good range  of motion with no tenderness or joint swelling.  Some tenderness on the dorsal aspect of both feet but no inflammation noted.  No synovitis over MTP joints.  CDAI Exam: CDAI Score: -- Patient Global: --; Provider Global: -- Swollen: --; Tender: -- Joint Exam 11/20/2023   No joint exam has been documented for this visit   There is currently no information documented on the homunculus. Go to the Rheumatology activity and complete the homunculus joint exam.  Investigation: No additional findings.  Imaging: No results found.  Recent Labs: Lab Results  Component Value Date   WBC 4.9 06/19/2023   HGB 13.2 06/19/2023   PLT 243 06/19/2023   NA 140 06/19/2023   K 4.3 06/19/2023   CL 103 06/19/2023   CO2 29 06/19/2023   GLUCOSE 86 06/19/2023   BUN 12 06/19/2023   CREATININE 0.89 06/19/2023   BILITOT 0.3 06/19/2023   ALKPHOS 76 03/06/2022   AST 27 06/19/2023   ALT 37 (H) 06/19/2023   PROT 6.6 06/19/2023   ALBUMIN 4.0 03/06/2022   CALCIUM 9.1 06/19/2023   GFRAA 92 06/10/2020   QFTBGOLDPLUS NEGATIVE 06/19/2023    Speciality Comments: TB Gold: 05/07/2022 Neg   Prior therapy includes:  methotrexate and Plaquenil (neutropenia) and Humira  (painful injections)  Procedures:  No procedures performed Allergies: Ceftin [cefuroxime axetil], Humira  [adalimumab ], and Methotrexate derivatives    Assessment / Plan:     Visit Diagnoses: Rheumatoid arthritis involving multiple sites with positive rheumatoid factor (HCC) - She has no synovitis on examination today.  Patient continues to experience intermittent arthralgias and joint stiffness but she has no active inflammation on exam.  She remains under the care of pain management.  Overall her rheumatoid arthritis remains well-controlled on Enbrel  50 mg subcutaneous injections once weekly.  She is tolerating Enbrel  without any side effects or injection site reactions.  No medication changes will be made at this time.  She was advised to notify us  if she develops signs or symptoms of a flare.  She will follow-up in the office in 5 months or sooner if needed. Plan: Lipid panel  High risk medication use - Enbrel  50 mg sq injection once weekly. CBC and CMP updated on 06/19/23. Orders for CBC and CMP released today.  Her next lab work will be due in July and every 3 months to monitor for drug toxicity. TB gold negative 06/19/23.  No recent or recurrent infections.  Discussed the importance of holding enbrel  if she develops signs or symptoms of an infection and to resume once the infection has completely cleared.    - Plan: CBC with Differential/Platelet, Comprehensive metabolic panel with GFR, Lipid panel  Elevated LFTs: AST 27 and ALT 37 on 06/19/23. CMP updated today.   Lipid screening -Lipid panel updated today.   Plan: Lipid panel  Spondylosis of lumbar spine: Chronic pain.  She remains under the care of pain management.  She has been taking naltrexone, tramadol , methocarbamol , Cymbalta , and Lyrica  for symptomatic relief.  She also uses a heating pad on her lower back.  Fibromyalgia - She experiences generalized myalgias and muscle  tenderness due to fibromyalgia.  She is been experiencing a fibromyalgia flare recently due to being under increased stress grieving the loss of her aunt.  She remains on Cymbalta  as prescribed.  She is been taking methocarbamol  as needed for muscle spasms.  A refill of methocarbamol  sent to the pharmacy today.  Patient plans on restarting water aerobics which she has found to be helpful  at managing her myofascial pain in the past.   Other fatigue: Chronic, stable.  She has interrupted sleep at night which contributes to her fatigue during the day.  Discussed the importance of good sleep hygiene.  History of insomnia: She takes Ambien  5 mg 1 tablet at bedtime as needed for insomnia.  She also has a prescription for trazodone  25 mg at bedtime as needed.  History of gastroesophageal reflux (GERD)  History of hypertension: Blood pressure was 132/87 today in the office.     Orders: Orders Placed This Encounter  Procedures   CBC with Differential/Platelet   Comprehensive metabolic panel with GFR   Lipid panel   Meds ordered this encounter  Medications   methocarbamol  (ROBAXIN ) 500 MG tablet    Sig: Take 1 tablet (500 mg total) by mouth 2 (two) times daily as needed for muscle spasms.    Dispense:  30 tablet    Refill:  0     Follow-Up Instructions: Return in about 5 months (around 04/21/2024) for Rheumatoid arthritis, Fibromyalgia.   Romayne Clubs, PA-C  Note - This record has been created using Dragon software.  Chart creation errors have been sought, but may not always  have been located. Such creation errors do not reflect on  the standard of medical care.

## 2023-11-08 ENCOUNTER — Other Ambulatory Visit (HOSPITAL_COMMUNITY): Payer: Self-pay

## 2023-11-11 ENCOUNTER — Encounter: Payer: Commercial Managed Care - PPO | Admitting: Physical Medicine & Rehabilitation

## 2023-11-11 ENCOUNTER — Other Ambulatory Visit: Payer: Self-pay

## 2023-11-11 ENCOUNTER — Other Ambulatory Visit (HOSPITAL_COMMUNITY): Payer: Self-pay

## 2023-11-11 MED ORDER — TRETINOIN 0.025 % EX CREA
TOPICAL_CREAM | CUTANEOUS | 10 refills | Status: DC
  Filled 2023-11-11: qty 20, 30d supply, fill #0
  Filled 2023-11-13 – 2024-02-24 (×2): qty 20, 30d supply, fill #1

## 2023-11-12 ENCOUNTER — Other Ambulatory Visit (HOSPITAL_COMMUNITY): Payer: Self-pay

## 2023-11-14 ENCOUNTER — Other Ambulatory Visit (HOSPITAL_COMMUNITY): Payer: Self-pay

## 2023-11-19 ENCOUNTER — Other Ambulatory Visit (HOSPITAL_COMMUNITY): Payer: Self-pay

## 2023-11-20 ENCOUNTER — Encounter: Payer: Self-pay | Admitting: Physician Assistant

## 2023-11-20 ENCOUNTER — Ambulatory Visit: Payer: Commercial Managed Care - PPO | Attending: Physician Assistant | Admitting: Physician Assistant

## 2023-11-20 ENCOUNTER — Other Ambulatory Visit: Payer: Self-pay

## 2023-11-20 ENCOUNTER — Other Ambulatory Visit (HOSPITAL_COMMUNITY): Payer: Self-pay

## 2023-11-20 VITALS — BP 132/87 | HR 80 | Resp 14 | Ht 65.0 in | Wt 218.0 lb

## 2023-11-20 DIAGNOSIS — Z87898 Personal history of other specified conditions: Secondary | ICD-10-CM

## 2023-11-20 DIAGNOSIS — M51369 Other intervertebral disc degeneration, lumbar region without mention of lumbar back pain or lower extremity pain: Secondary | ICD-10-CM | POA: Diagnosis not present

## 2023-11-20 DIAGNOSIS — Z13220 Encounter for screening for lipoid disorders: Secondary | ICD-10-CM

## 2023-11-20 DIAGNOSIS — Z8719 Personal history of other diseases of the digestive system: Secondary | ICD-10-CM

## 2023-11-20 DIAGNOSIS — R7989 Other specified abnormal findings of blood chemistry: Secondary | ICD-10-CM | POA: Diagnosis not present

## 2023-11-20 DIAGNOSIS — M797 Fibromyalgia: Secondary | ICD-10-CM

## 2023-11-20 DIAGNOSIS — M0579 Rheumatoid arthritis with rheumatoid factor of multiple sites without organ or systems involvement: Secondary | ICD-10-CM

## 2023-11-20 DIAGNOSIS — Z8679 Personal history of other diseases of the circulatory system: Secondary | ICD-10-CM | POA: Diagnosis not present

## 2023-11-20 DIAGNOSIS — M47816 Spondylosis without myelopathy or radiculopathy, lumbar region: Secondary | ICD-10-CM

## 2023-11-20 DIAGNOSIS — Z79899 Other long term (current) drug therapy: Secondary | ICD-10-CM | POA: Diagnosis not present

## 2023-11-20 DIAGNOSIS — R5383 Other fatigue: Secondary | ICD-10-CM | POA: Diagnosis not present

## 2023-11-20 MED ORDER — METHOCARBAMOL 500 MG PO TABS
500.0000 mg | ORAL_TABLET | Freq: Two times a day (BID) | ORAL | 0 refills | Status: DC | PRN
  Filled 2023-11-20: qty 30, 15d supply, fill #0

## 2023-11-20 NOTE — Patient Instructions (Signed)
Standing Labs We placed an order today for your standing lab work.   Please have your standing labs drawn in July and every 3 months   Please have your labs drawn 2 weeks prior to your appointment so that the provider can discuss your lab results at your appointment, if possible.  Please note that you may see your imaging and lab results in MyChart before we have reviewed them. We will contact you once all results are reviewed. Please allow our office up to 72 hours to thoroughly review all of the results before contacting the office for clarification of your results.  WALK-IN LAB HOURS  Monday through Thursday from 8:00 am -12:30 pm and 1:00 pm-5:00 pm and Friday from 8:00 am-12:00 pm.  Patients with office visits requiring labs will be seen before walk-in labs.  You may encounter longer than normal wait times. Please allow additional time. Wait times may be shorter on  Monday and Thursday afternoons.  We do not book appointments for walk-in labs. We appreciate your patience and understanding with our staff.   Labs are drawn by [facility]. Please bring your co-pay at the time of your lab draw.  You may receive a bill from [facility] for your lab work.  Please note if you are on Hydroxychloroquine and and an order has been placed for a Hydroxychloroquine level,  you will need to have it drawn 4 hours or more after your last dose.  If you wish to have your labs drawn at another location, please call the office 24 hours in advance so we can fax the orders.  The office is located at [address], [address], [place],  [address]   If you have any questions regarding directions or hours of operation,  please call [phone].   As a reminder, please drink plenty of water prior to coming for your lab work. Thanks!  

## 2023-11-20 NOTE — Progress Notes (Signed)
 CBC WNL

## 2023-11-21 LAB — CBC WITH DIFFERENTIAL/PLATELET
Absolute Lymphocytes: 1725 {cells}/uL (ref 850–3900)
Absolute Monocytes: 627 {cells}/uL (ref 200–950)
Basophils Absolute: 29 {cells}/uL (ref 0–200)
Basophils Relative: 0.6 %
Eosinophils Absolute: 98 {cells}/uL (ref 15–500)
Eosinophils Relative: 2 %
HCT: 38.5 % (ref 35.0–45.0)
Hemoglobin: 13 g/dL (ref 11.7–15.5)
MCH: 29.8 pg (ref 27.0–33.0)
MCHC: 33.8 g/dL (ref 32.0–36.0)
MCV: 88.3 fL (ref 80.0–100.0)
MPV: 10.4 fL (ref 7.5–12.5)
Monocytes Relative: 12.8 %
Neutro Abs: 2421 {cells}/uL (ref 1500–7800)
Neutrophils Relative %: 49.4 %
Platelets: 205 10*3/uL (ref 140–400)
RBC: 4.36 10*6/uL (ref 3.80–5.10)
RDW: 12.5 % (ref 11.0–15.0)
Total Lymphocyte: 35.2 %
WBC: 4.9 10*3/uL (ref 3.8–10.8)

## 2023-11-21 LAB — LIPID PANEL
Cholesterol: 152 mg/dL (ref <200)
HDL: 51 mg/dL (ref >=50)
LDL Cholesterol (Calc): 83 mg/dL
Non-HDL Cholesterol (Calc): 101 mg/dL (ref <130)
Total CHOL/HDL Ratio: 3 (calc) (ref <5.0)
Triglycerides: 89 mg/dL (ref <150)

## 2023-11-21 LAB — COMPREHENSIVE METABOLIC PANEL WITH GFR
AG Ratio: 1.4 (calc) (ref 1.0–2.5)
ALT: 42 U/L — ABNORMAL HIGH (ref 6–29)
AST: 32 U/L (ref 10–35)
Albumin: 4.1 g/dL (ref 3.6–5.1)
Alkaline phosphatase (APISO): 84 U/L (ref 31–125)
BUN: 13 mg/dL (ref 7–25)
CO2: 29 mmol/L (ref 20–32)
Calcium: 9.6 mg/dL (ref 8.6–10.2)
Chloride: 105 mmol/L (ref 98–110)
Creat: 0.96 mg/dL (ref 0.50–0.99)
Globulin: 2.9 g/dL (ref 1.9–3.7)
Glucose, Bld: 92 mg/dL (ref 65–99)
Potassium: 4 mmol/L (ref 3.5–5.3)
Sodium: 142 mmol/L (ref 135–146)
Total Bilirubin: 0.3 mg/dL (ref 0.2–1.2)
Total Protein: 7 g/dL (ref 6.1–8.1)
eGFR: 73 mL/min/{1.73_m2} (ref >=60)

## 2023-11-21 NOTE — Progress Notes (Signed)
 ALT remains elevated and continues to trend up gradually-42. AST WNL. Please clarify if she is taking any tylenol ? Alcohol use? Rest of CMP WNL CBC WNL Lipid panel WNL

## 2023-12-01 ENCOUNTER — Emergency Department (HOSPITAL_COMMUNITY)

## 2023-12-01 ENCOUNTER — Encounter (HOSPITAL_COMMUNITY): Payer: Self-pay

## 2023-12-01 ENCOUNTER — Emergency Department (HOSPITAL_COMMUNITY)
Admission: EM | Admit: 2023-12-01 | Discharge: 2023-12-01 | Disposition: A | Discharge status: Home / Self Care | Attending: Emergency Medicine | Admitting: Emergency Medicine

## 2023-12-01 ENCOUNTER — Other Ambulatory Visit: Payer: Self-pay

## 2023-12-01 DIAGNOSIS — S93492A Sprain of other ligament of left ankle, initial encounter: Secondary | ICD-10-CM

## 2023-12-01 DIAGNOSIS — M79672 Pain in left foot: Secondary | ICD-10-CM | POA: Diagnosis not present

## 2023-12-01 DIAGNOSIS — S93402A Sprain of unspecified ligament of left ankle, initial encounter: Secondary | ICD-10-CM | POA: Diagnosis not present

## 2023-12-01 DIAGNOSIS — W010XXA Fall on same level from slipping, tripping and stumbling without subsequent striking against object, initial encounter: Secondary | ICD-10-CM | POA: Insufficient documentation

## 2023-12-01 DIAGNOSIS — M25572 Pain in left ankle and joints of left foot: Secondary | ICD-10-CM | POA: Diagnosis present

## 2023-12-01 DIAGNOSIS — Y990 Civilian activity done for income or pay: Secondary | ICD-10-CM | POA: Insufficient documentation

## 2023-12-01 MED ORDER — NAPROXEN 250 MG PO TABS
500.0000 mg | ORAL_TABLET | Freq: Once | ORAL | Status: AC
  Administered 2023-12-01: 500 mg via ORAL
  Filled 2023-12-01: qty 2

## 2023-12-01 MED ORDER — OXYCODONE-ACETAMINOPHEN 5-325 MG PO TABS
2.0000 | ORAL_TABLET | Freq: Once | ORAL | Status: AC
  Administered 2023-12-01: 2 via ORAL
  Filled 2023-12-01: qty 2

## 2023-12-01 NOTE — ED Triage Notes (Signed)
 Pt injured her left ankle at work, tripped and fell over her foot, falling backwards and landing on her bottom/back. Denies hitting head or LOC. Able to bear weight but endorses pain with any movement.

## 2023-12-01 NOTE — ED Provider Notes (Signed)
 [place] EMERGENCY DEPARTMENT AT [facility] Provider Note   CSN: [id number] Arrival date & time: 12/01/23  0010     History  Chief Complaint  Patient presents with   Ankle Pain    Karen Burton is a 48 y.o. female.  48 yo f who hurt her left ankle after over plantarflexing it earlier today. Swollen, painful to walk. No other injuries.    Ankle Pain      Home Medications Prior to Admission medications   Medication Sig Start Date End Date Taking? Authorizing Provider  amLODipine  (NORVASC ) 5 MG tablet Take 1 tablet (5 mg total) by mouth daily for blood pressure. 03/28/23   Cook, Jayce G, DO  BIOTIN PO Take by mouth daily.    [provider]  Cholecalciferol (VITAMIN D-3 PO) Take by mouth daily.    [provider]  diclofenac  (VOLTAREN ) 50 MG EC tablet Take 1 tablet (50 mg total) by mouth 2 (two) times daily with a meal. Do not take with other NSAIDs. Patient not taking: Reported on 11/20/2023 03/04/23     docusate sodium (COLACE) 100 MG capsule Take 100 mg by mouth daily.     [provider]  DULoxetine  (CYMBALTA ) 60 MG capsule Take 1 capsule (60 mg total) by mouth daily. 10/16/23   Romayne Clubs, PA-C  etanercept  (ENBREL  MINI) 50 MG/ML injection Inject 50 mg into the skin once a week. 11/02/23   Romayne Clubs, PA-C  fluticasone  (FLONASE ) 50 MCG/ACT nasal spray Place 1 spray into both nostrils 2 (two) times daily. 03/09/23     hydrochlorothiazide  (HYDRODIURIL ) 12.5 MG tablet Take 1 tablet (12.5 mg total) by mouth daily. 03/28/23   Cook, Jayce G, DO  losartan  (COZAAR ) 50 MG tablet Take 1 tablet (50 mg total) by mouth daily. 03/28/23   Cook, Jayce G, DO  methocarbamol  (ROBAXIN ) 500 MG tablet Take 1 tablet (500 mg total) by mouth 2 (two) times daily as needed for muscle spasms. 11/20/23   Romayne Clubs, PA-C  Multiple Vitamins-Minerals (MULTIVITAMIN WOMEN PO) Take 1 tablet by mouth daily.    [provider]  NONFORMULARY OR  COMPOUNDED ITEM Take 1 mg by mouth daily. Naltrexone 09/13/23   Lylia Sand, MD  Norethindrone  Acetate-Ethinyl Estrad-FE (JUNEL  FE 24) 1-20 MG-MCG(24) tablet TAKE 1 TABLET BY MOUTH ONCE DAILY (USE NDC [address]-78) 05/15/22 07/24/23  Javan Messing, NP  pregabalin  (LYRICA ) 50 MG capsule Take 1 capsule (50 mg total) by mouth 2 (two) times daily. 06/13/23   Lylia Sand, MD  traMADol  (ULTRAM ) 50 MG tablet Take 1 tablet (50 mg total) by mouth every 8 (eight) hours as needed for severe pain (pain score 7-10). 07/18/23   Cook, Jayce G, DO  traZODone  (DESYREL ) 50 MG tablet Take 1/2 tablet (25 mg total) by mouth at bedtime as needed. 09/26/22   Cook, Jayce G, DO  tretinoin  (RETIN-A ) 0.025 % cream Apply pea size amount to affected regions 2-3 x a week building up to every night as tolerated. 11/11/23     zolpidem  (AMBIEN ) 5 MG tablet Take 1 tablet (5 mg total) by mouth at bedtime as needed for sleep. 03/06/22   Cook, Jayce G, DO      Allergies    Ceftin [cefuroxime axetil], Humira  [adalimumab ], and Methotrexate derivatives    Review of Systems   Review of Systems  Physical Exam Updated Vital Signs BP (!) 149/101   Pulse 82   Temp 98 F (36.7 C) (Oral)  Resp 18   Ht 5\' 5"  (1.651 m)   Wt 98.9 kg   LMP 08/11/2023   SpO2 99%   BMI 36.28 kg/m  Physical Exam Vitals and nursing note reviewed.  Constitutional:      Appearance: She is well-developed.  HENT:     Head: Normocephalic and atraumatic.     Nose: No congestion or rhinorrhea.     Mouth/Throat:     Mouth: Mucous membranes are moist.  Eyes:     Pupils: Pupils are equal, round, and reactive to light.  Cardiovascular:     Rate and Rhythm: Normal rate and regular rhythm.  Pulmonary:     Effort: No respiratory distress.     Breath sounds: No stridor.  Abdominal:     General: There is no distension.  Musculoskeletal:     Cervical back: Normal range of motion.     Comments: Left ankle swelling with some ecchymosis   Skin:    General: Skin is warm and dry.  Neurological:     General: No focal deficit present.     Mental Status: She is alert.     ED Results / Procedures / Treatments   Labs (all labs ordered are listed, but only abnormal results are displayed) Labs Reviewed - No data to display  EKG None  Radiology DG Ankle Complete Left Result Date: 12/01/2023 CLINICAL DATA:  Tripped. Pain on heel on top of foot. Ankle injury. Swelling on the medial side. EXAM: LEFT ANKLE COMPLETE - 3+ VIEW COMPARISON:  01/31/2022 FINDINGS: No acute fracture or dislocation. Swelling about the ankle greatest medially. IMPRESSION: No acute fracture or dislocation. Swelling about the ankle greatest medially. Electronically Signed   By: Rozell Cornet M.D.   On: 12/01/2023 01:00    Procedures Procedures    Medications Ordered in ED Medications  oxyCODONE -acetaminophen  (PERCOCET/ROXICET) 5-325 MG per tablet 2 tablet (2 tablets Oral Given 12/01/23 0301)  naproxen (NAPROSYN) tablet 500 mg (500 mg Oral Given 12/01/23 0300)    ED Course/ Medical Decision Making/ A&P                                 Medical Decision Making Amount and/or Complexity of Data Reviewed Radiology: ordered.  Risk Prescription drug management.   Likely sprain of anterior tibial tendon. Discussed RICE therapy. Will utilize cam boot for now. FU w/ PCP if not improving within a week. Ice/compression as well. NSAIDs for discomfort.    Final Clinical Impression(s) / ED Diagnoses Final diagnoses:  Sprain of other ligament of left ankle, initial encounter    Rx / DC Orders ED Discharge Orders     None         Jazmarie Biever, Reymundo Caulk, MD 12/01/23 [id number]

## 2023-12-03 ENCOUNTER — Other Ambulatory Visit (HOSPITAL_COMMUNITY): Payer: Self-pay

## 2023-12-03 ENCOUNTER — Other Ambulatory Visit: Payer: Self-pay | Admitting: Physician Assistant

## 2023-12-03 DIAGNOSIS — M0579 Rheumatoid arthritis with rheumatoid factor of multiple sites without organ or systems involvement: Secondary | ICD-10-CM

## 2023-12-03 MED ORDER — ENBREL MINI 50 MG/ML ~~LOC~~ SOCT
50.0000 mg | SUBCUTANEOUS | 0 refills | Status: DC
  Filled 2023-12-04: qty 4, 28d supply, fill #0
  Filled 2024-01-08: qty 4, 28d supply, fill #1
  Filled 2024-02-13: qty 4, 28d supply, fill #2

## 2023-12-03 NOTE — Telephone Encounter (Signed)
 Last Fill: 11/02/2023 (30 day supply)  Labs: 11/20/2023 CBC WNL Rest of CMP WNL CBC WNL   TB Gold: 06/19/2023    Next Visit: 04/22/2024  Last Visit: 11/20/2023  DX: Rheumatoid arthritis involving multiple sites with positive rheumatoid factor    Current Dose per office note 11/20/2023: Enbrel  50 mg sq injection once weekly.   Okay to refill Enbrel ?

## 2023-12-03 NOTE — Progress Notes (Signed)
 Specialty Pharmacy Ongoing Clinical Assessment Note  Karen Burton is a 48 y.o. female who is being followed by the specialty pharmacy service for RxSp Rheumatoid Arthritis   Patient's specialty medication(s) reviewed today: Etanercept  (Enbrel  Mini)   Missed doses in the last 4 weeks: 0   Patient/Caregiver did not have any additional questions or concerns.   Therapeutic benefit summary: Patient is achieving benefit   Adverse events/side effects summary: No adverse events/side effects   Patient's therapy is appropriate to: Continue    Goals Addressed             This Visit's Progress    Minimize recurrence of flares   On track    Patient is on track. Patient will maintain adherence.  Patient reports she is well-controlled with no recent flares.          Follow up:  6 months  Malachi Screws Specialty Pharmacist

## 2023-12-03 NOTE — Progress Notes (Signed)
 Specialty Pharmacy Refill Coordination Note  Karen Burton is a 48 y.o. female contacted today regarding refills of specialty medication(s) Etanercept  (Enbrel  Mini)   Patient requested Delivery   Delivery date: 12/18/23   Verified address: [address]   [place] [address]   Medication will be filled on 12/17/23.This fill date is pending response to refill request from provider. Patient is aware and if they have not received fill by intended date they must follow up with pharmacy.

## 2023-12-04 ENCOUNTER — Inpatient Hospital Stay: Admitting: Family Medicine

## 2023-12-04 ENCOUNTER — Other Ambulatory Visit: Payer: Self-pay

## 2023-12-11 ENCOUNTER — Ambulatory Visit (HOSPITAL_COMMUNITY)
Admission: RE | Admit: 2023-12-11 | Discharge: 2023-12-11 | Disposition: A | Discharge status: Home / Self Care | Source: Ambulatory Visit | Attending: Family Medicine | Admitting: Family Medicine

## 2023-12-11 ENCOUNTER — Other Ambulatory Visit (HOSPITAL_COMMUNITY): Payer: Self-pay | Admitting: Family Medicine

## 2023-12-11 DIAGNOSIS — Z026 Encounter for examination for insurance purposes: Secondary | ICD-10-CM | POA: Insufficient documentation

## 2023-12-11 DIAGNOSIS — T148XXA Other injury of unspecified body region, initial encounter: Secondary | ICD-10-CM | POA: Insufficient documentation

## 2023-12-13 ENCOUNTER — Other Ambulatory Visit (HOSPITAL_COMMUNITY): Payer: Self-pay

## 2023-12-17 ENCOUNTER — Other Ambulatory Visit: Payer: Self-pay

## 2023-12-23 ENCOUNTER — Ambulatory Visit: Admitting: Nutrition

## 2023-12-25 ENCOUNTER — Other Ambulatory Visit: Payer: Self-pay | Admitting: Physical Medicine & Rehabilitation

## 2023-12-25 ENCOUNTER — Other Ambulatory Visit: Payer: Self-pay

## 2023-12-25 MED ORDER — PREGABALIN 50 MG PO CAPS
50.0000 mg | ORAL_CAPSULE | Freq: Two times a day (BID) | ORAL | 3 refills | Status: DC
  Filled 2023-12-25 – 2023-12-26 (×2): qty 60, 30d supply, fill #0
  Filled 2024-01-29: qty 60, 30d supply, fill #1

## 2023-12-26 ENCOUNTER — Other Ambulatory Visit (HOSPITAL_COMMUNITY): Payer: Self-pay

## 2023-12-26 ENCOUNTER — Other Ambulatory Visit: Payer: Self-pay

## 2023-12-27 ENCOUNTER — Ambulatory Visit (HOSPITAL_COMMUNITY)
Admission: RE | Admit: 2023-12-27 | Discharge: 2023-12-27 | Disposition: A | Discharge status: Home / Self Care | Source: Ambulatory Visit | Attending: Orthopaedic Surgery | Admitting: Orthopaedic Surgery

## 2023-12-27 ENCOUNTER — Other Ambulatory Visit (HOSPITAL_COMMUNITY): Payer: Self-pay | Admitting: Orthopaedic Surgery

## 2023-12-27 DIAGNOSIS — M25572 Pain in left ankle and joints of left foot: Secondary | ICD-10-CM

## 2023-12-27 DIAGNOSIS — M65972 Unspecified synovitis and tenosynovitis, left ankle and foot: Secondary | ICD-10-CM | POA: Diagnosis not present

## 2023-12-28 ENCOUNTER — Other Ambulatory Visit (HOSPITAL_COMMUNITY): Payer: Self-pay

## 2023-12-28 ENCOUNTER — Other Ambulatory Visit: Payer: Self-pay

## 2023-12-28 MED ORDER — MELOXICAM 7.5 MG PO TABS
7.5000 mg | ORAL_TABLET | Freq: Two times a day (BID) | ORAL | 0 refills | Status: DC
  Filled 2023-12-28: qty 60, 30d supply, fill #0

## 2023-12-28 MED ORDER — DICLOFENAC SODIUM 1 % EX GEL
CUTANEOUS | 1 refills | Status: DC
  Filled 2023-12-28: qty 100, 12d supply, fill #0

## 2023-12-30 ENCOUNTER — Encounter: Payer: Self-pay | Admitting: Physical Medicine & Rehabilitation

## 2023-12-30 ENCOUNTER — Other Ambulatory Visit (HOSPITAL_COMMUNITY): Payer: Self-pay | Admitting: Orthopaedic Surgery

## 2023-12-30 ENCOUNTER — Other Ambulatory Visit: Payer: Self-pay

## 2023-12-30 ENCOUNTER — Other Ambulatory Visit (HOSPITAL_COMMUNITY): Payer: Self-pay

## 2023-12-30 DIAGNOSIS — M25572 Pain in left ankle and joints of left foot: Secondary | ICD-10-CM

## 2024-01-03 ENCOUNTER — Other Ambulatory Visit: Payer: Self-pay

## 2024-01-08 ENCOUNTER — Other Ambulatory Visit: Payer: Self-pay

## 2024-01-09 ENCOUNTER — Encounter (INDEPENDENT_AMBULATORY_CARE_PROVIDER_SITE_OTHER): Payer: Self-pay

## 2024-01-10 ENCOUNTER — Other Ambulatory Visit: Payer: Self-pay

## 2024-01-10 ENCOUNTER — Other Ambulatory Visit (HOSPITAL_COMMUNITY): Payer: Self-pay

## 2024-01-10 NOTE — Progress Notes (Signed)
 Specialty Pharmacy Refill Coordination Note  Karen Burton is a 48 y.o. female contacted today regarding refills of specialty medication(s) Etanercept  (Enbrel  Mini)   Patient requested (Patient-Rptd) Delivery   Delivery date: 01/14/24   Verified address: (Patient-Rptd) [address] [place] [address]   Medication will be filled on 01/13/24.

## 2024-01-16 ENCOUNTER — Other Ambulatory Visit: Payer: Self-pay

## 2024-01-16 ENCOUNTER — Other Ambulatory Visit (HOSPITAL_COMMUNITY): Payer: Self-pay

## 2024-01-16 MED ORDER — DICLOFENAC SODIUM 1 % EX GEL
2.0000 g | Freq: Four times a day (QID) | CUTANEOUS | 1 refills | Status: AC
  Filled 2024-01-16: qty 100, 12d supply, fill #0
  Filled 2024-09-03: qty 100, 13d supply, fill #0

## 2024-01-16 MED ORDER — MELOXICAM 7.5 MG PO TABS
7.5000 mg | ORAL_TABLET | Freq: Every day | ORAL | 0 refills | Status: DC | PRN
  Filled 2024-01-16 – 2024-02-19 (×2): qty 30, 30d supply, fill #0

## 2024-01-17 ENCOUNTER — Other Ambulatory Visit (HOSPITAL_COMMUNITY): Payer: Self-pay

## 2024-01-17 ENCOUNTER — Other Ambulatory Visit: Payer: Self-pay

## 2024-01-29 ENCOUNTER — Other Ambulatory Visit: Payer: Self-pay | Admitting: Physician Assistant

## 2024-01-30 ENCOUNTER — Other Ambulatory Visit: Payer: Self-pay

## 2024-01-30 ENCOUNTER — Other Ambulatory Visit (HOSPITAL_COMMUNITY): Payer: Self-pay

## 2024-01-30 MED ORDER — DULOXETINE HCL 60 MG PO CPEP
60.0000 mg | ORAL_CAPSULE | Freq: Every day | ORAL | 2 refills | Status: DC
  Filled 2024-01-30: qty 30, 30d supply, fill #0
  Filled 2024-02-24: qty 30, 30d supply, fill #1
  Filled 2024-03-31: qty 30, 30d supply, fill #2

## 2024-01-30 NOTE — Telephone Encounter (Signed)
 Last Fill: 10/16/2023  Next Visit: 04/21/2024  Last Visit: 11/20/2023  Dx: Fibromyalgia   Current Dose per office note on 11/20/2023: Dose not discussed  Okay to refill Cymbalta ?

## 2024-02-11 ENCOUNTER — Other Ambulatory Visit (HOSPITAL_COMMUNITY): Payer: Self-pay

## 2024-02-13 ENCOUNTER — Other Ambulatory Visit: Payer: Self-pay | Admitting: Pharmacy Technician

## 2024-02-13 ENCOUNTER — Other Ambulatory Visit: Payer: Self-pay

## 2024-02-13 NOTE — Progress Notes (Signed)
 Specialty Pharmacy Refill Coordination Note  Karen Burton is a 48 y.o. female contacted today regarding refills of specialty medication(s) Etanercept  (Enbrel  Mini)   Patient requested Delivery   Delivery date: 02/25/24   Verified address: [address] [place] [address]   Medication will be filled on 02/24/24.

## 2024-02-19 ENCOUNTER — Other Ambulatory Visit: Payer: Self-pay | Admitting: Family Medicine

## 2024-02-19 ENCOUNTER — Other Ambulatory Visit: Payer: Self-pay

## 2024-02-19 ENCOUNTER — Other Ambulatory Visit (HOSPITAL_COMMUNITY): Payer: Self-pay

## 2024-02-21 ENCOUNTER — Encounter: Attending: Physical Medicine & Rehabilitation | Admitting: Physical Medicine & Rehabilitation

## 2024-02-21 ENCOUNTER — Other Ambulatory Visit (HOSPITAL_COMMUNITY): Payer: Self-pay

## 2024-02-21 ENCOUNTER — Encounter: Payer: Self-pay | Admitting: Physical Medicine & Rehabilitation

## 2024-02-21 ENCOUNTER — Other Ambulatory Visit: Payer: Self-pay

## 2024-02-21 VITALS — BP 138/92 | HR 99 | Ht 65.0 in

## 2024-02-21 DIAGNOSIS — E669 Obesity, unspecified: Secondary | ICD-10-CM | POA: Insufficient documentation

## 2024-02-21 DIAGNOSIS — M797 Fibromyalgia: Secondary | ICD-10-CM | POA: Insufficient documentation

## 2024-02-21 DIAGNOSIS — M545 Low back pain, unspecified: Secondary | ICD-10-CM | POA: Insufficient documentation

## 2024-02-21 DIAGNOSIS — G8929 Other chronic pain: Secondary | ICD-10-CM | POA: Diagnosis not present

## 2024-02-21 MED ORDER — PREGABALIN 50 MG PO CAPS
50.0000 mg | ORAL_CAPSULE | Freq: Three times a day (TID) | ORAL | 3 refills | Status: DC
  Filled 2024-02-21 – 2024-02-27 (×2): qty 90, 30d supply, fill #0
  Filled 2024-03-31: qty 90, 30d supply, fill #1
  Filled 2024-05-04: qty 90, 30d supply, fill #2

## 2024-02-21 MED ORDER — PREGABALIN 50 MG PO CAPS
50.0000 mg | ORAL_CAPSULE | Freq: Two times a day (BID) | ORAL | 3 refills | Status: DC
  Filled 2024-02-21: qty 90, 45d supply, fill #0

## 2024-02-21 NOTE — Progress Notes (Signed)
 Subjective:    Patient ID: Karen Burton, female    DOB: May 07, 1976, 48 y.o.   MRN: [id number]  HPI  HPI  Karen Burton is a 48 y.o. year old female  who  has a past medical history of Anemia, Dysmenorrhea (12/20/2015), Eczema, Fibroids (12/20/2015), Fibromyalgia, GERD (gastroesophageal reflux disease), Hypertension, Menorrhagia with regular cycle (12/20/2015), PONV (postoperative nausea and vomiting), Rash, Reflux, Rheumatoid arthritis (HCC), Rhinitis, Vitamin D deficiency, and Vulvar itching (12/20/2015).   They are presenting to PM&R clinic as a new patient for pain management evaluation. They were referred by Dr. Bluford for treatment of fibromyalgia pain.  She has a history of RA and reports this was diagnosed 17 years ago.  Initially her primary location of pain was her lower back.  She later developed pain in her hips and buttocks bilaterally.  Pain in her hips is throbbing in quality.  Over time she feels as the pain has spread to other locations including her arms and legs.  Her neck and shoulders are often very painful.  She feels like her entire body is stressed and this is contributing to her pain.  She denies shooting pain down her arms or legs.  Patient reports he is generally sensitive to medications and they will often make her sleepy.  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  She also helps to take care of her mother.  After doing a lot of activity she often has to lay in bed for a few days to recover.  She reports poor sleep, sometimes takes Ambien  to help this.  Reports frequent constipation with occasional diarrhea.  Reports history of IBS  Decreased mood at times.  Denies SI or HI.  She reports she has a lot of stress because she feels like the pain could prevent her from being able to care for her mother and could limit her from working.   Red flag symptoms: No red flags for back pain endorsed in Hx or ROS  Medications  tried: Topical medications- Aleve  roll on  Nsaids Ibuprofen- limited by liver enzyme elevation, meloxicam - didn't help much  Tylenol   Helps a little  Opiates  Tramadol - didn't help , used this for short time, helped her sleep  Gabapentin- tried it one time  TCAs  denies  SNRIs  Cymbalta - has been on it for a while Robaxin - didn't help   Other treatments: PT - March 2024, restarted Aquatic therapy TENs unit  has not tried it before  Injections- SI joint injections- didn't help , Trigger point injections- didn't help  Dry needling didn't help   Surgery Denies joint/back surgeries    Prior UDS results: No results found for: LABOPIA, COCAINSCRNUR, LABBENZ, AMPHETMU, THCU, LABBARB    Interval History 06/13/23  Karen Burton is here for follow-up regarding her chronic pain.  Patient reports that her body wide pain is much improved with aquatic therapy.  Her worst pain continues to be her lower back average about 3 out of 10.  Currently pain is 0 out of 10.  She reports too much sedation with Lyrica  75 mg twice daily but says she did not have this issue with 50 mg dose.   Interval History 09/13/23 Patient reports her pain has been worse recently.  She feels like pool exercises were helping, until there was an issue with the hot water heater at the pool she is going to.  She plans to restart this soon.  She is tired  tolerating Lyrica  50 mg daily, higher doses caused sedation.  Interval History 02/21/2024 Patient is here for follow-up of chronic pain.  She continues to have pain in multiple areas of her body.  Recently pain has been dull and achy and has not the worst in her lower back, upper buttocks.  She is using massage device that does help calm these areas down.  She has not been doing pool exercises recently, she had to stop this after she had a left ankle injury 11/30/2023.  She has full with [facility].  Ortho note indicates she had a left hind foot contusions, anterior process  calcaneus fracture and peroneal tendinopathy.  Initially was wearing a boot on her left leg.  Wearing this caused a lot of pain in her lower back.  More recently she was upgraded to a ASO brace.  She noticed improvement in her overall pain when she started taking Lyrica .  She feels like her body has gotten used to it.   She is also taking low-dose naltrexone, she is not sure if this has had much benefit to her pain yet.    Pain Inventory Average Pain 7 Pain Right Now 4 My pain is constant, dull, stabbing, and aching  In the last 24 hours, has pain interfered with the following? General activity 6 Relation with others 4 Enjoyment of life 9 What TIME of day is your pain at its worst? morning , daytime, evening, and night Sleep (in general) Poor  Pain is worse with: walking, bending, standing, and some activites Pain improves with: rest, heat/ice, therapy/exercise, and medication Relief from Meds: 3     Family History  Problem Relation Age of Onset   Diabetes Mother    Hypertension Mother    Asthma Mother    Cancer Mother        breast   Thyroid  disease Mother    Arthritis Mother    Yvone' disease Mother    Fibroids Sister    Cancer Maternal Grandmother        pancreatic   COPD Maternal Aunt    Asthma Maternal Aunt    Gout Maternal Aunt    Social History   Socioeconomic History   Marital status: Single    Spouse name: Not on file   Number of children: 0   Years of education: Not on file   Highest education level: Not on file  Occupational History   Not on file  Tobacco Use   Smoking status: Never    Passive exposure: Never   Smokeless tobacco: Never  Vaping Use   Vaping status: Never Used  Substance and Sexual Activity   Alcohol use: No   Drug use: Never   Sexual activity: Not Currently    Birth control/protection: Pill  Other Topics Concern   Not on file  Social History Narrative   Not on file   Social Drivers of Health   Financial Resource Strain:  Low Risk  (05/15/2022)   Overall Financial Resource Strain (CARDIA)    Difficulty of Paying Living Expenses: Not hard at all  Food Insecurity: No Food Insecurity (05/15/2022)   Hunger Vital Sign    Worried About Running Out of Food in the Last Year: Never true    Ran Out of Food in the Last Year: Never true  Transportation Needs: No Transportation Needs (05/15/2022)   PRAPARE - Administrator, Civil Service (Medical): No    Lack of Transportation (Non-Medical): No  Physical Activity: Insufficiently Active (  05/15/2022)   Exercise Vital Sign    Days of Exercise per Week: 3 days    Minutes of Exercise per Session: 30 min  Stress: Stress Concern Present (05/15/2022)   [employer] of Occupational Health - Occupational Stress Questionnaire    Feeling of Stress : To some extent  Social Connections: Moderately Isolated (05/15/2022)   Social Connection and Isolation Panel    Frequency of Communication with Friends and Family: More than three times a week    Frequency of Social Gatherings with Friends and Family: More than three times a week    Attends Religious Services: More than 4 times per year    Active Member of Clubs or Organizations: No    Attends Banker Meetings: Never    Marital Status: Never married   Past Surgical History:  Procedure Laterality Date   CHOLECYSTECTOMY N/A 02/08/2022   Procedure: LAPAROSCOPIC CHOLECYSTECTOMY;  Surgeon: Evonnie Dorothyann LABOR, DO;  Location: [facility];  Service: General;  Laterality: N/A;   ESOPHAGOGASTRODUODENOSCOPY ENDOSCOPY     IR GENERIC HISTORICAL  02/07/2016   IR RADIOLOGIST EVAL & MGMT 02/07/2016 Norleen Roulette, MD [facility] INTERV RAD   WISDOM TOOTH EXTRACTION     Past Medical History:  Diagnosis Date   Anemia    Dysmenorrhea 12/20/2015   Eczema    mild   Fibroids 12/20/2015   Fibromyalgia    GERD (gastroesophageal reflux disease)    Hypertension    Menorrhagia with regular cycle 12/20/2015   PONV  (postoperative nausea and vomiting)    Rash    Reflux    Rheumatoid arthritis (HCC)    Rhinitis    chonic   Vitamin D deficiency    Vulvar itching 12/20/2015   Ht 5' 5 (1.651 m)   BMI 36.28 kg/m   Opioid Risk Score:   Fall Risk Score:  `1  Depression screen [facility] 2/9     08/29/2023    9:07 AM 06/13/2023   10:02 AM 05/03/2023    1:11 PM 03/28/2023    9:39 AM 09/26/2022    9:39 AM 05/15/2022    1:51 PM 02/13/2021    9:58 AM  Depression screen PHQ 2/9  Decreased Interest 3 0 3 1 1 1 1   Down, Depressed, Hopeless 3 0 2 1 1 1 1   PHQ - 2 Score 6 0 5 2 2 2 2   Altered sleeping 3  2 2 1 3 2   Tired, decreased energy 3  3 2  0 3 2  Change in appetite 1  0 0 0 0 0  Feeling bad or failure about yourself  1  1 0 1 1 1   Trouble concentrating 0  0 0 0 1 1  Moving slowly or fidgety/restless 0  0 0 0 0 0  Suicidal thoughts 0  0 0 0 0 0  PHQ-9 Score 14  11 6 4 10 8   Difficult doing work/chores Very difficult  Somewhat difficult Somewhat difficult Somewhat difficult  Somewhat difficult     Review of Systems  Musculoskeletal:  Positive for back pain and neck pain.       Left ankle pain Bilateral shin pain Buttocks pain Bilateral shoulder pain  All other systems reviewed and are negative.      Objective:   Physical Exam   Gen: no distress, normal appearing HEENT: oral mucosa pink and moist, NCAT Chest: normal effort, normal rate of breathing Abd: soft, non-distended Ext: no edema Psych: pleasant, appropriate Skin: intact Neuro: Awake, follows  commands, cranial nerves II through XII grossly intact, normal speech and language Strength moving all 4 extremities to gravity and resistance Sensory exam normal for light touch and pain in all 4 limbs. No limb ataxia or cerebellar signs. No abnormal tone appreciated.   Musculoskeletal:  Wearing ankle brace left ankle TTP in bilateral arms and legs throughout Parascapular muscle tenderness bilaterally Tenderness to palpation lumbar and  cervical paraspinal muscles Patient indicates very tender areas in her upper glutes  MRI L-spine 2-3 24 IMPRESSION: 1. Lumbar spondylosis and degenerative disc disease, causing mild displacement of the right L2 nerve in the lateral extraforaminal space at L2-3 and borderline right foraminal stenosis at L5-S1. 2. Large heterogeneous presacral structure believed to represent fibroid uterus, only partially included on today's exam.     Assessment & Plan:   1) Fibromyalgia  -Diagnosis of fibromyalgia being consistent with her body wide pain, mood disorder, sleep disorder, IBS history 2) Rheumatoid arthritis  -Appears to be well-controlled continue follow-up with rheumatology 3) Mood disorder  -She declines to discuss this in further detail, denies SI or HI 4) Chronic right lower back pain without sciatica.  Lumbar spondylosis noted on recent MRI 5)Obesity Body mass index is 36.28 kg/m.   -Continue duloxetine  60 mg daily -Continue as needed Tylenol  -Patient improvement with aquatic therapy.  Think restarting for self aquatic exercises would be beneficial when ankle injury is improved. -Zynex nexwave ordered prior visit -Patient had too much sedation with Lyrica  75 mg.  Continue Lyrica  50 mg, increase to 3 times day, if not tolerating can go back down to 2 times a day -Discussed foods that could be helpful for pain prior visit -Discussed gradual progression of low impact exercise -Patient appears to have a lot of pain with spinal extension facet loading, MRI L-spine with evidence of L2-3, L3-4 facet arthropathy.  Consider referral for medial branch block/RFA at a later time  Discussed trying tai chi with patient -Discussed trying TENS, Zynex expensive however she could try OTC item -Can increase to low-dose naltrexone to 4 mg daily.  This is 2 capsules of the 2 mg pills. - Patient is trying to lose weight.  She is looking into several weight loss clinics, not interested in referral at  this time. - Could consider trigger point injections glutes, lower back muscles

## 2024-02-22 ENCOUNTER — Other Ambulatory Visit (HOSPITAL_COMMUNITY): Payer: Self-pay

## 2024-02-23 MED ORDER — TRAMADOL HCL 50 MG PO TABS
50.0000 mg | ORAL_TABLET | Freq: Three times a day (TID) | ORAL | 0 refills | Status: DC | PRN
  Filled 2024-02-23: qty 15, 5d supply, fill #0

## 2024-02-24 ENCOUNTER — Other Ambulatory Visit (HOSPITAL_COMMUNITY): Payer: Self-pay

## 2024-02-24 ENCOUNTER — Other Ambulatory Visit: Payer: Self-pay

## 2024-02-27 ENCOUNTER — Other Ambulatory Visit (HOSPITAL_BASED_OUTPATIENT_CLINIC_OR_DEPARTMENT_OTHER): Payer: Self-pay

## 2024-02-27 ENCOUNTER — Other Ambulatory Visit (HOSPITAL_COMMUNITY): Payer: Self-pay

## 2024-02-27 ENCOUNTER — Other Ambulatory Visit: Payer: Self-pay

## 2024-02-27 MED ORDER — TRETINOIN 0.05 % EX CREA
TOPICAL_CREAM | CUTANEOUS | 11 refills | Status: AC
  Filled 2024-02-27: qty 20, 30d supply, fill #0
  Filled 2024-05-04: qty 20, 30d supply, fill #1

## 2024-03-09 ENCOUNTER — Other Ambulatory Visit (HOSPITAL_BASED_OUTPATIENT_CLINIC_OR_DEPARTMENT_OTHER): Payer: Self-pay

## 2024-03-12 ENCOUNTER — Other Ambulatory Visit: Payer: Self-pay

## 2024-03-12 ENCOUNTER — Other Ambulatory Visit: Payer: Self-pay | Admitting: Rheumatology

## 2024-03-12 DIAGNOSIS — M0579 Rheumatoid arthritis with rheumatoid factor of multiple sites without organ or systems involvement: Secondary | ICD-10-CM

## 2024-03-12 MED ORDER — ENBREL MINI 50 MG/ML ~~LOC~~ SOCT
50.0000 mg | SUBCUTANEOUS | 0 refills | Status: DC
  Filled 2024-03-13 – 2024-04-14 (×5): qty 4, 28d supply, fill #0

## 2024-03-12 NOTE — Telephone Encounter (Signed)
 Last Fill: 12/03/2023  Labs: 11/20/2023 ALT remains elevated and continues to trend up gradually-42. AST WNL. Please clarify if she is taking any tylenol ? Alcohol use? Rest of CMP WNL CBC WNL,Lipid panel WNL    I called the patient to remind her she needs updated labs. Patient verbalized understanding and stated she'd send a MyChart message to get he labs received.  TB Gold: 06/19/2023 Negative   Next Visit: 04/21/2024  Last Visit: 11/20/2023  DX: Rheumatoid arthritis involving multiple sites with positive rheumatoid factor.  Current Dose per office note 11/20/2023: Enbrel  50 mg sq injection once weekly.    Okay to refill Enbrel ?

## 2024-03-13 ENCOUNTER — Other Ambulatory Visit: Payer: Self-pay

## 2024-03-15 ENCOUNTER — Encounter: Payer: Self-pay | Admitting: Family Medicine

## 2024-03-16 ENCOUNTER — Other Ambulatory Visit: Payer: Self-pay

## 2024-03-17 ENCOUNTER — Other Ambulatory Visit: Payer: Self-pay

## 2024-03-17 ENCOUNTER — Other Ambulatory Visit (HOSPITAL_COMMUNITY): Payer: Self-pay

## 2024-03-17 IMAGING — DX DG CHEST 2V
2 series · 2 of 2 positions shown · non-contrast
Comparison: 03/04/2018

CLINICAL DATA: Chest pain for several hours, initial encounter

EXAM:
CHEST - 2 VIEW

[chest pa]
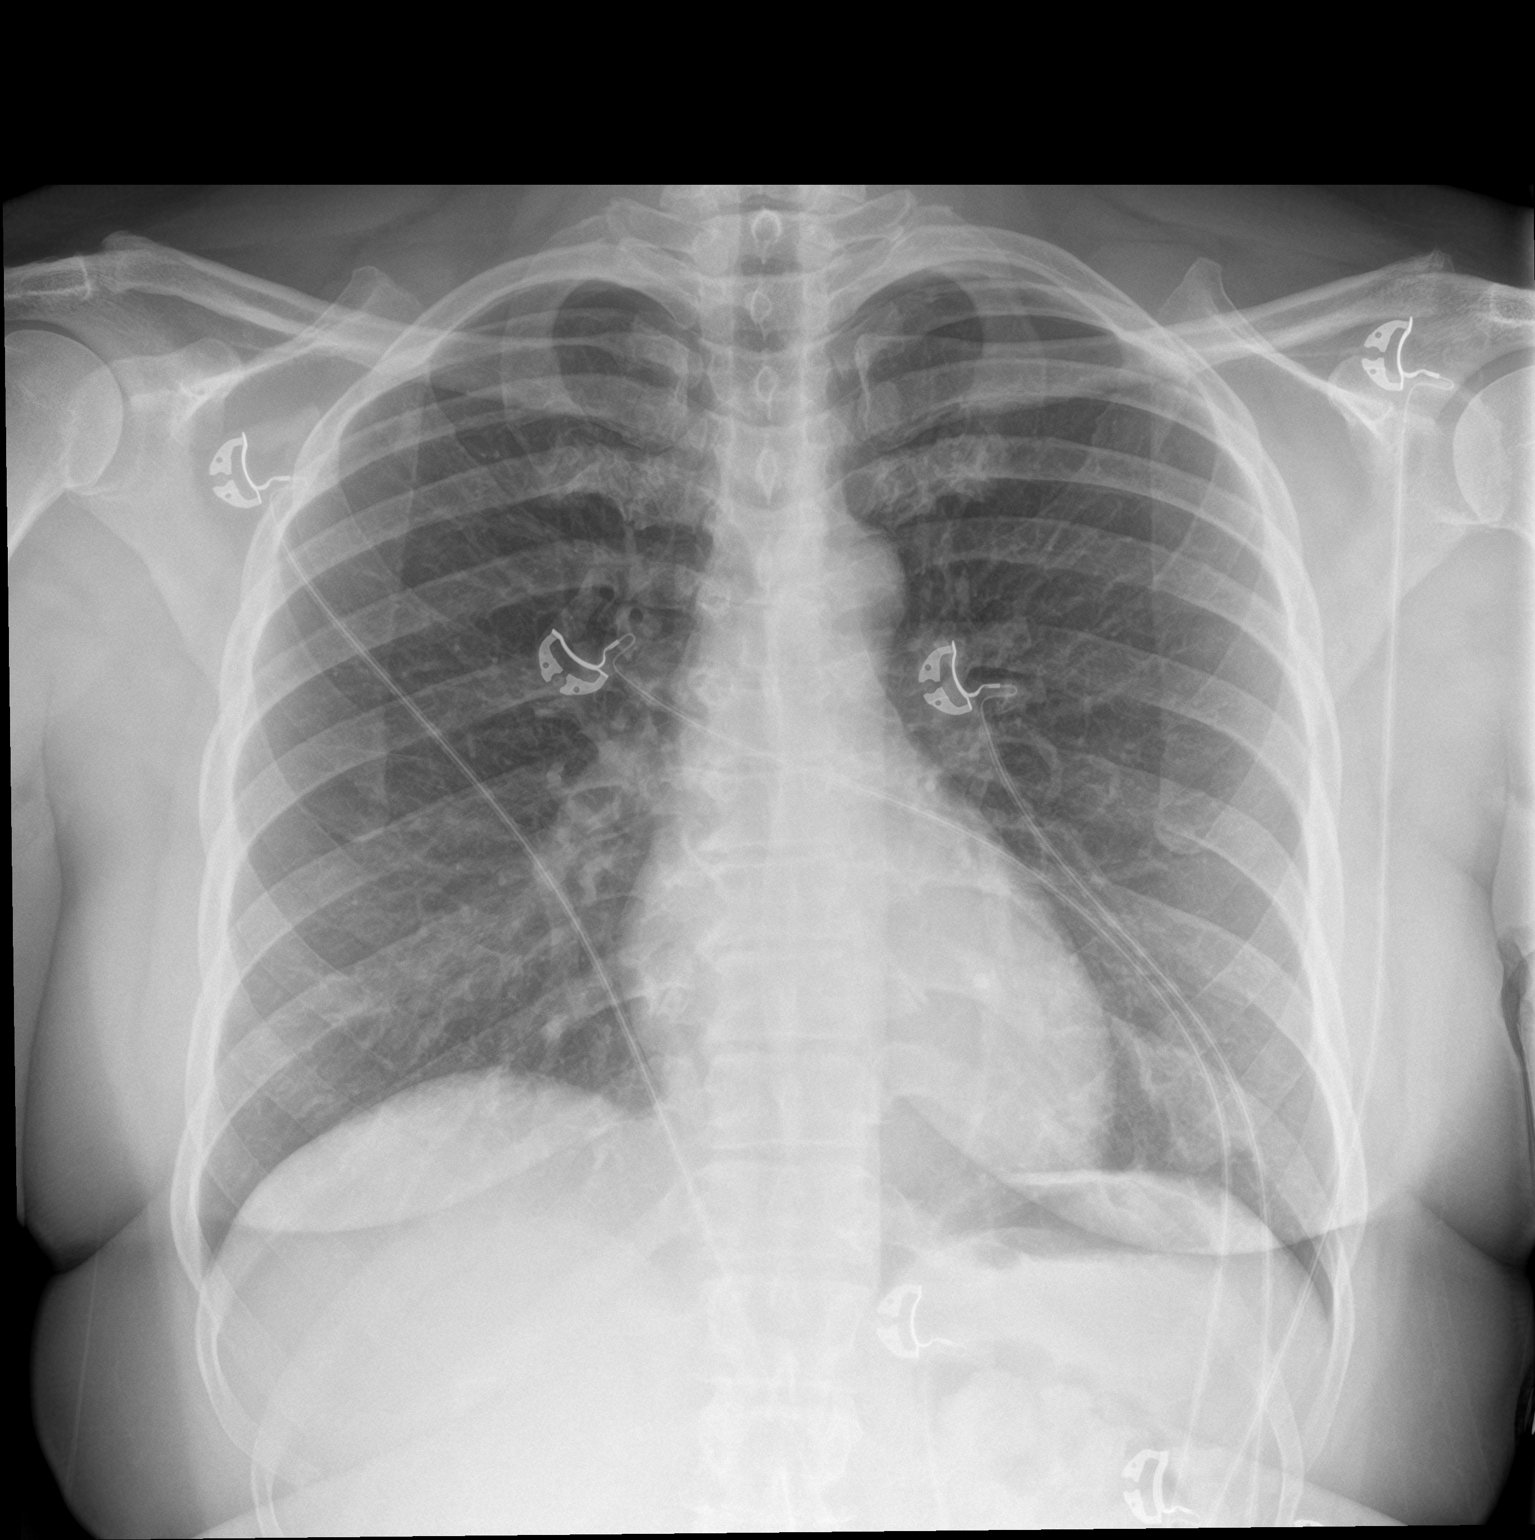

[chest lat]
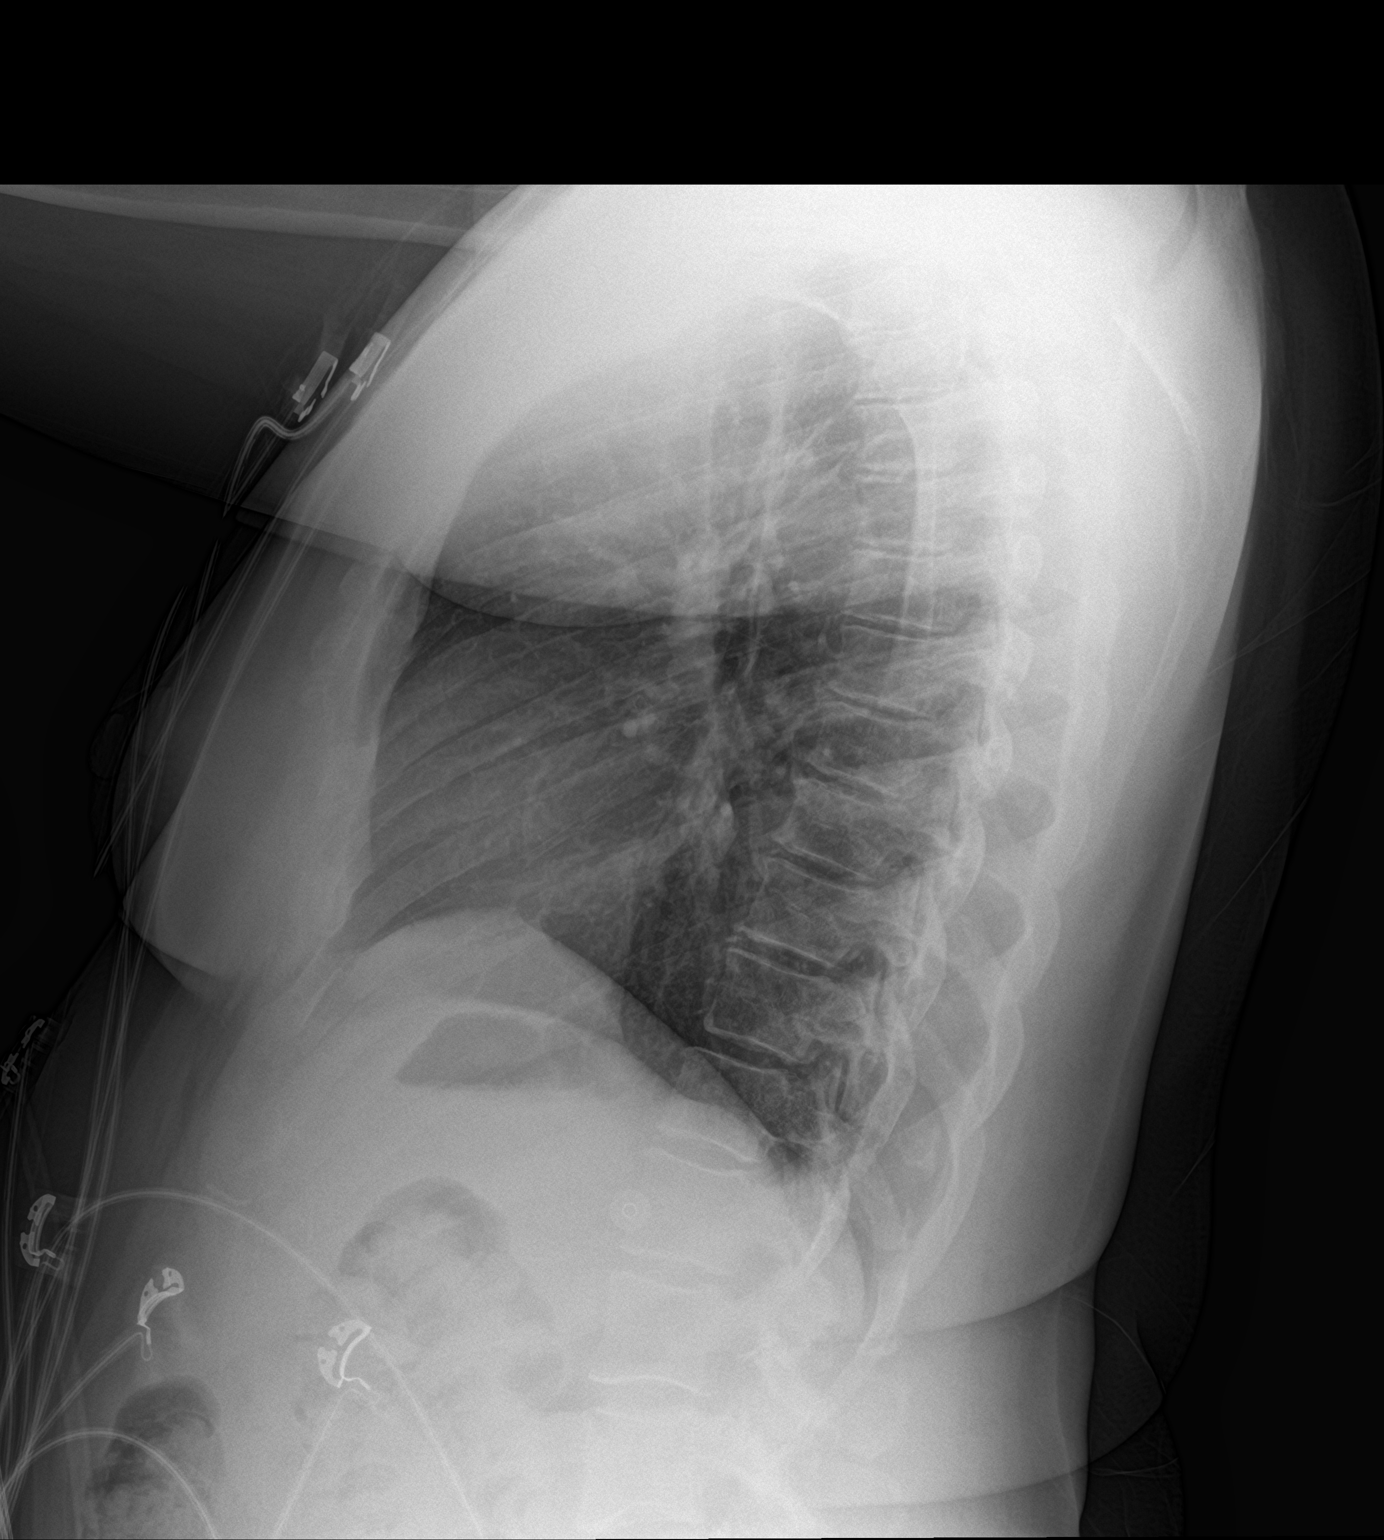

[2 of 2 positions shown; findings below may reference images not displayed]

FINDINGS: The heart size and mediastinal contours are within normal limits.
Both lungs are clear. The visualized skeletal structures are
unremarkable.
IMPRESSION: No active cardiopulmonary disease.

## 2024-03-17 MED ORDER — MELOXICAM 7.5 MG PO TABS
7.5000 mg | ORAL_TABLET | Freq: Every day | ORAL | 0 refills | Status: DC | PRN
  Filled 2024-03-17: qty 30, 30d supply, fill #0

## 2024-03-17 MED ORDER — DICLOFENAC SODIUM 1 % EX GEL
2.0000 g | Freq: Four times a day (QID) | CUTANEOUS | 1 refills | Status: DC
  Filled 2024-03-17: qty 100, 13d supply, fill #0

## 2024-03-18 ENCOUNTER — Other Ambulatory Visit (HOSPITAL_COMMUNITY): Payer: Self-pay

## 2024-03-22 IMAGING — US US ABDOMEN LIMITED
1 series · 14 of 25 positions shown · non-contrast
Comparison: Ultrasound 08/02/2010.

CLINICAL DATA: Right upper quadrant pain with nausea, vomiting and
diarrhea over the last 5 days.

EXAM:
ULTRASOUND ABDOMEN LIMITED RIGHT UPPER QUADRANT

[Series 1: us abdomen limited ruq (liver/gb) · 14 of 67 slices shown]
[im 1/67]
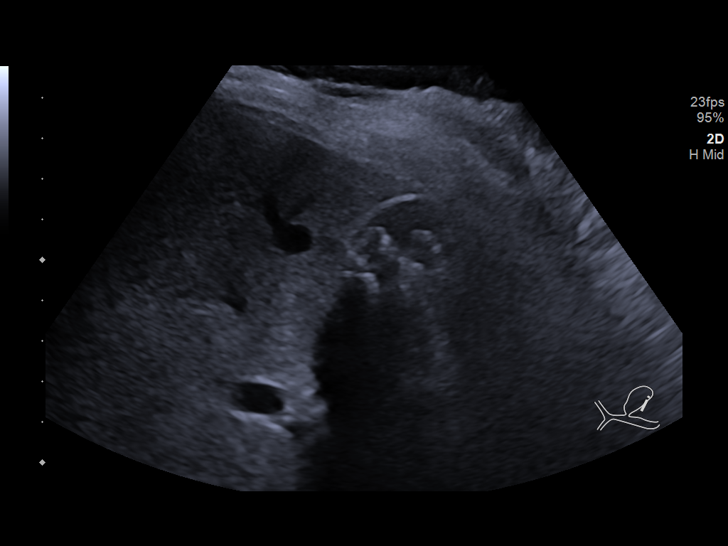
[im 6/67]
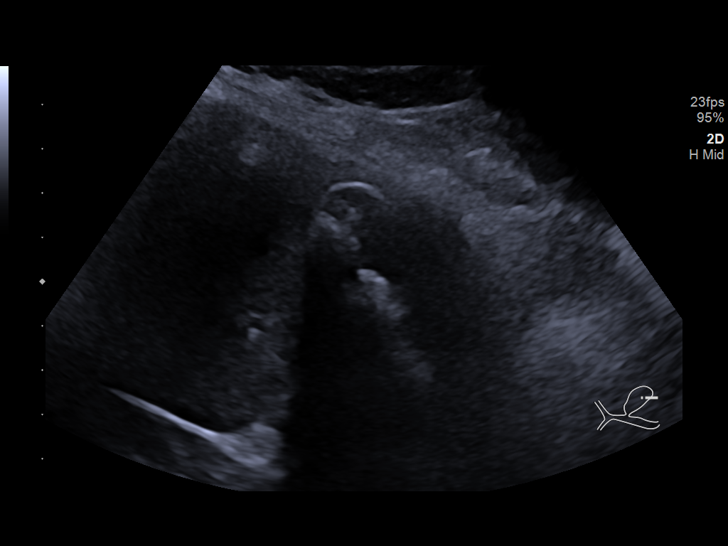
[im 12/67]
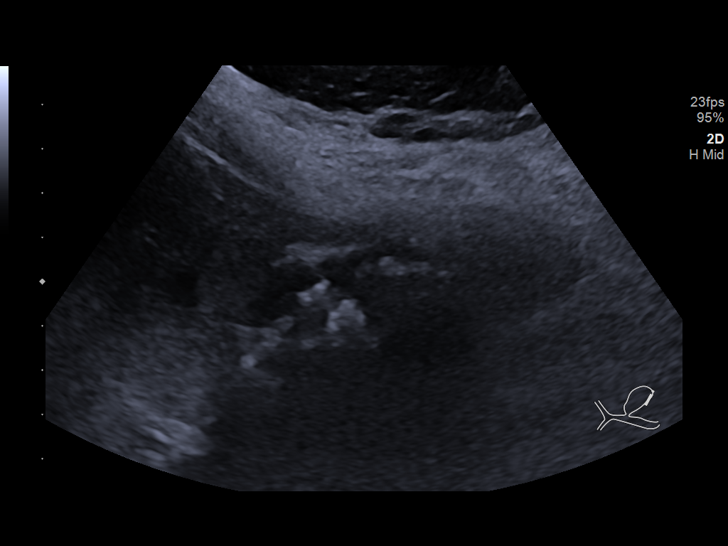
[im 17/67]
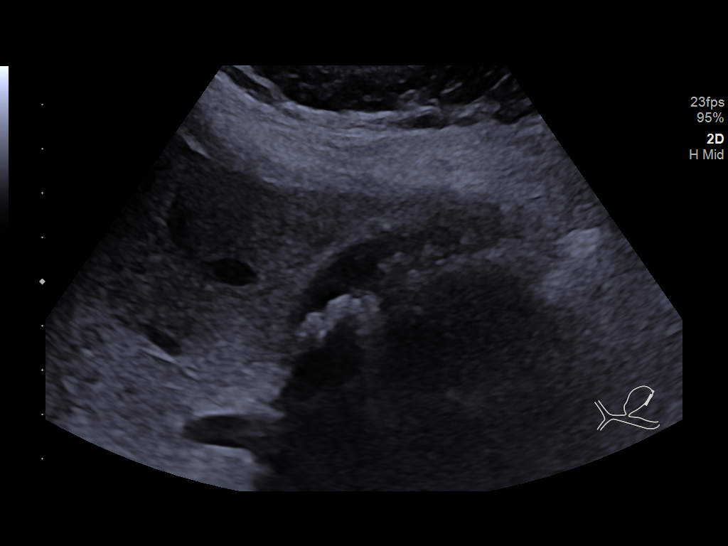
[im 23/67]
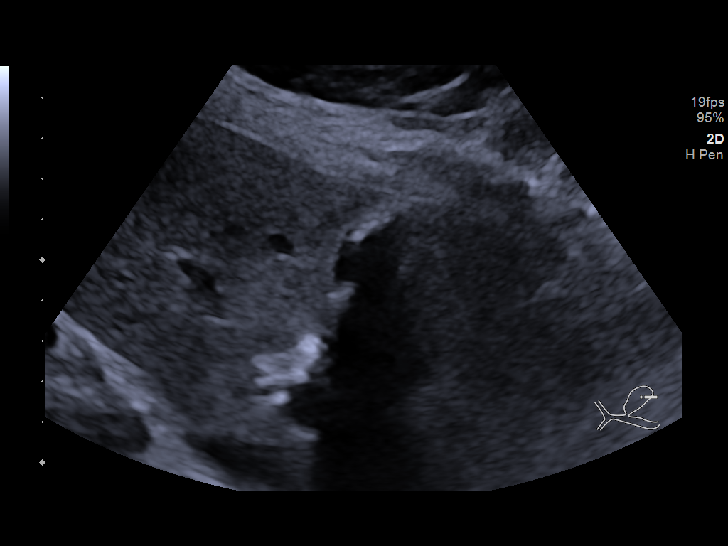
[im 25/67]
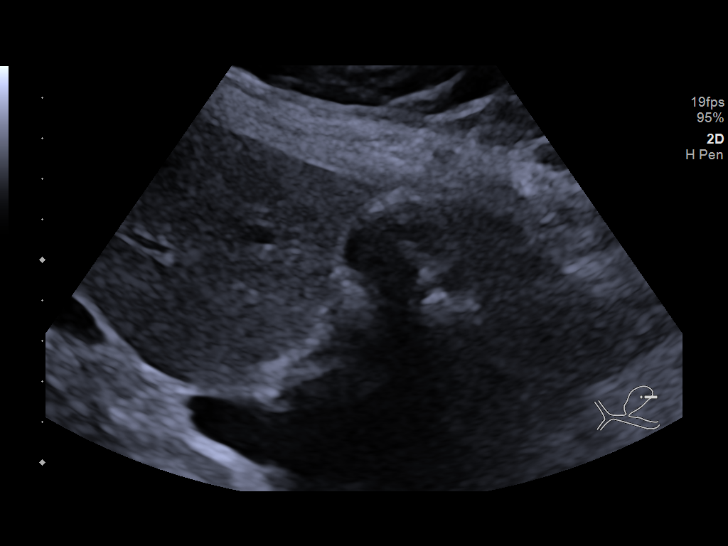
[im 31/67]
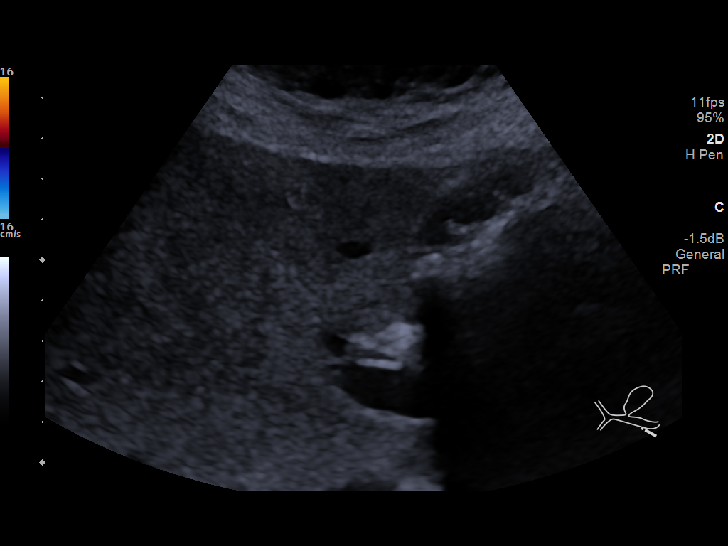
[im 36/67]
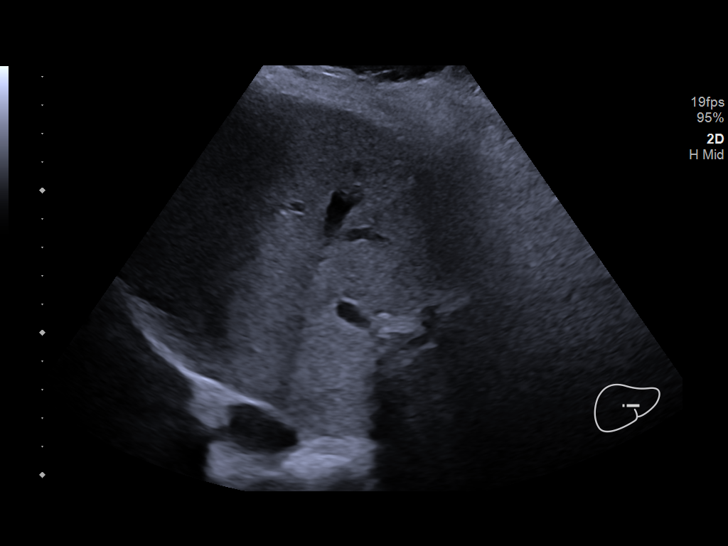
[im 42/67]
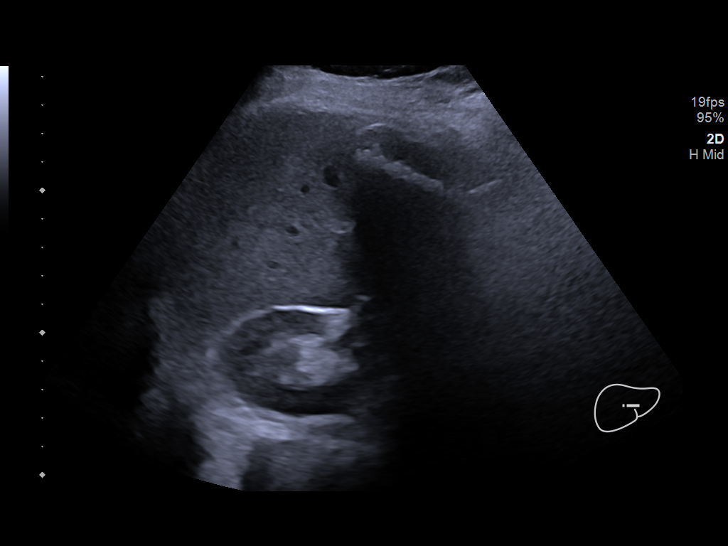
[im 45/67]
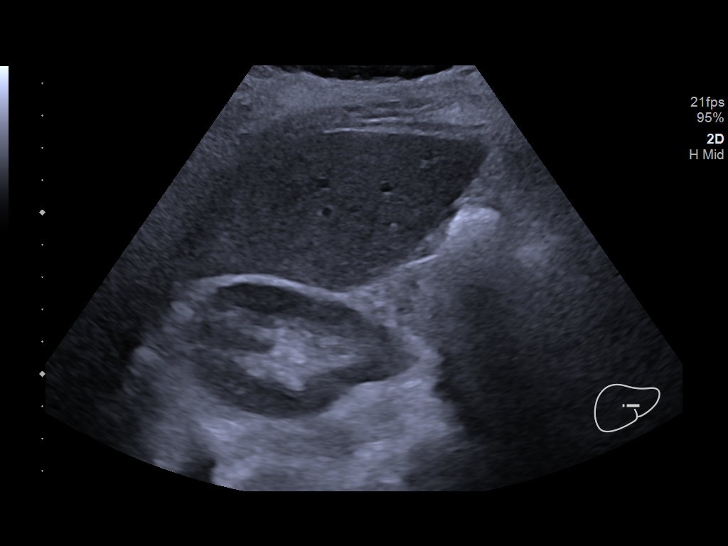
[im 50/67]
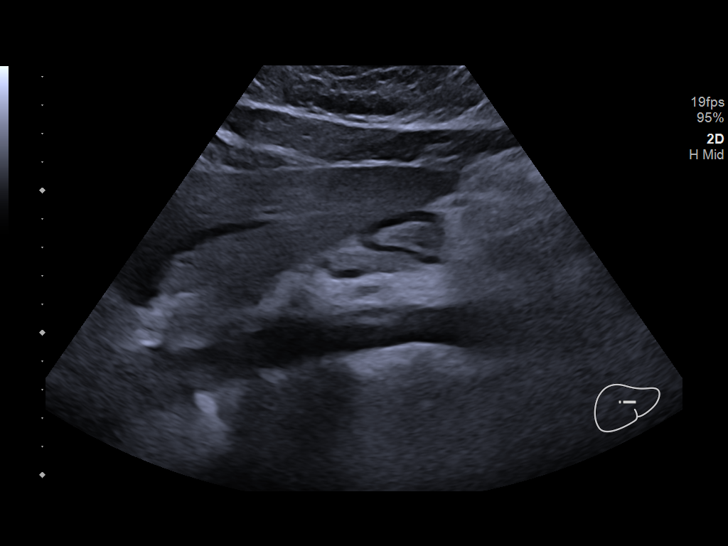
[im 56/67]
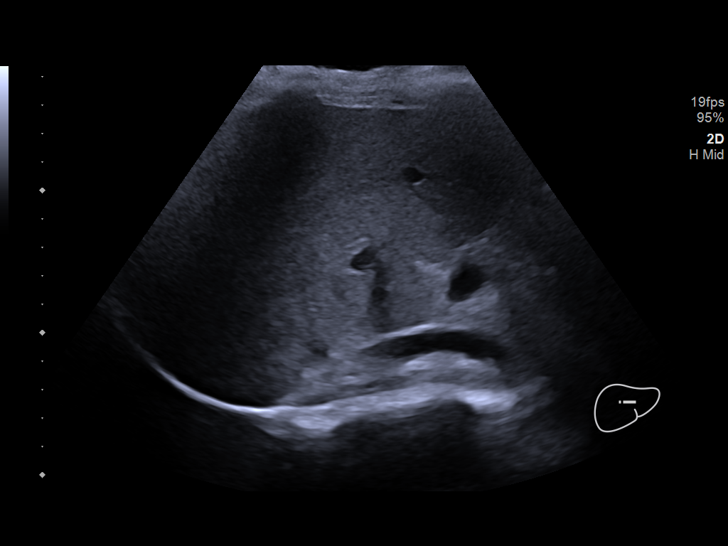
[im 61/67]
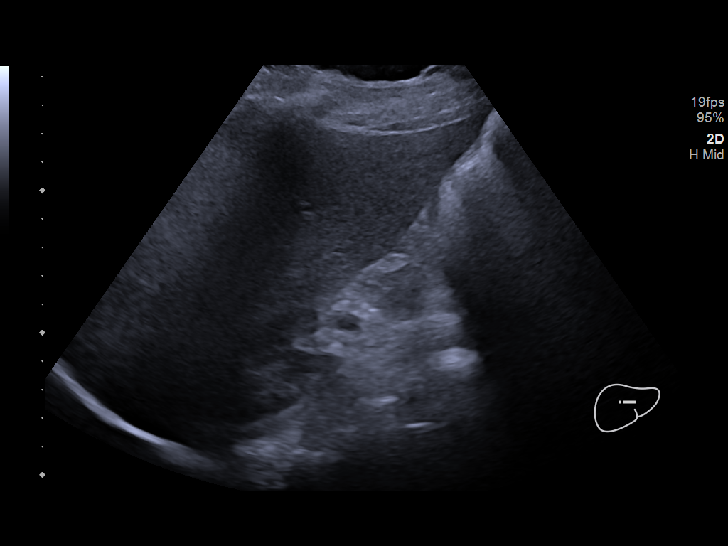
[im 67/67]
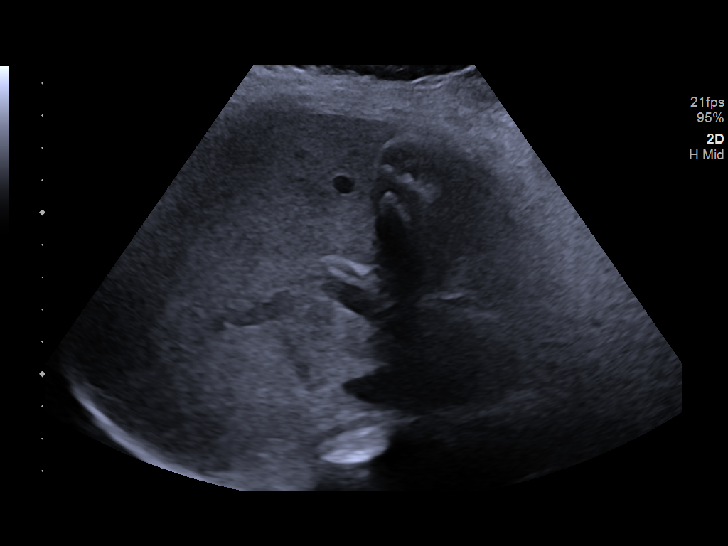

[14 of 25 positions shown; findings below may reference images not displayed]

FINDINGS: Gallbladder:

The gallbladder contains multiple gallstones and gallbladder sludge.
Wall thickening measuring 4.2 mm. No Murphy sign.

Common bile duct:

Diameter: 2.6 mm, normal.

Liver:

Mildly increased echogenicity suggesting fatty change. No focal
lesion or ductal dilatation. Portal vein is patent on color Doppler
imaging with normal direction of blood flow towards the liver.

Other: None.
IMPRESSION: Cholelithiasis and gallbladder sludge. Gallbladder wall thickening.
No definite Murphy sign. Cholecystitis is possible. No evidence of
biliary obstruction.

Mild fatty change of the liver.

## 2024-03-24 DIAGNOSIS — Z0279 Encounter for issue of other medical certificate: Secondary | ICD-10-CM

## 2024-03-26 ENCOUNTER — Ambulatory Visit (HOSPITAL_COMMUNITY)
Admission: RE | Admit: 2024-03-26 | Discharge: 2024-03-26 | Disposition: A | Discharge status: Home / Self Care | Source: Ambulatory Visit | Attending: Adult Health | Admitting: Adult Health

## 2024-03-26 ENCOUNTER — Encounter: Payer: Self-pay | Admitting: Adult Health

## 2024-03-26 ENCOUNTER — Ambulatory Visit: Admitting: Adult Health

## 2024-03-26 ENCOUNTER — Encounter (HOSPITAL_COMMUNITY): Payer: Self-pay

## 2024-03-26 VITALS — BP 143/90 | HR 79 | Ht 65.0 in | Wt 226.0 lb

## 2024-03-26 DIAGNOSIS — R232 Flushing: Secondary | ICD-10-CM | POA: Diagnosis not present

## 2024-03-26 DIAGNOSIS — N926 Irregular menstruation, unspecified: Secondary | ICD-10-CM | POA: Diagnosis not present

## 2024-03-26 DIAGNOSIS — I10 Essential (primary) hypertension: Secondary | ICD-10-CM | POA: Diagnosis not present

## 2024-03-26 DIAGNOSIS — Z1231 Encounter for screening mammogram for malignant neoplasm of breast: Secondary | ICD-10-CM | POA: Insufficient documentation

## 2024-03-26 DIAGNOSIS — Z1331 Encounter for screening for depression: Secondary | ICD-10-CM | POA: Diagnosis not present

## 2024-03-26 DIAGNOSIS — Z01419 Encounter for gynecological examination (general) (routine) without abnormal findings: Secondary | ICD-10-CM

## 2024-03-26 DIAGNOSIS — R61 Generalized hyperhidrosis: Secondary | ICD-10-CM | POA: Diagnosis not present

## 2024-03-26 DIAGNOSIS — N951 Menopausal and female climacteric states: Secondary | ICD-10-CM

## 2024-03-26 NOTE — Progress Notes (Signed)
 Patient ID: Karen Burton, female   DOB: Apr 18, 1976, 48 y.o.   MRN: [id number] History of Present Illness: Karen Burton is a 48 year old black female,single, G1P0010, in for a well woman gyn exam. She is skipping period, has hot flashes and nights before periods start.     Component Value Date/Time   DIAGPAP  05/15/2022 1358    - Negative for intraepithelial lesion or malignancy (NILM)   DIAGPAP  02/13/2019 0000    NEGATIVE FOR INTRAEPITHELIAL LESIONS OR MALIGNANCY.   HPVHIGH Negative 05/15/2022 1358   ADEQPAP  05/15/2022 1358    Satisfactory for evaluation; transformation zone component ABSENT.   ADEQPAP  02/13/2019 0000    Satisfactory for evaluation  endocervical/transformation zone component PRESENT.    PCP is Dr Bluford  Current Medications, Allergies, Past Medical History, Past Surgical History, Family History and Social History were reviewed in [employer] electronic medical record.     Review of Systems: Patient denies any daily headaches, hearing loss, fatigue, blurred vision, shortness of breath, chest pain, abdominal pain, problems with bowel movements, urination, or intercourse.(Not active). No joint pain or mood swings.  See HPI for positives    Physical Exam:BP (!) 143/90 (BP Location: Left Arm, Patient Position: Sitting, Cuff Size: Large)   Pulse 79   Ht 5' 5 (1.651 m)   Wt 226 lb (102.5 kg)   LMP 03/16/2024   BMI 37.61 kg/m   General:  Well developed, well nourished, no acute distress Skin:  Warm and dry Neck:  Midline trachea, normal thyroid , good ROM, no lymphadenopathy Lungs; Clear to auscultation bilaterally Breast:  No dominant palpable mass, retraction, or nipple discharge Cardiovascular: Regular rate and rhythm Abdomen:  Soft, non tender, no hepatosplenomegaly Pelvic:  External genitalia is normal in appearance, no lesions.  The vagina is normal in appearance. Urethra has no lesions or masses. The cervix is smooth.   Uterus is felt to be normal  size, shape, and contour.  No adnexal masses or tenderness noted.Bladder is non tender, no masses felt. Rectal: Deferred Extremities/musculoskeletal:  No swelling or varicosities noted, no clubbing or cyanosis, has brace left ankle, hurt at work with elevator door Psych:  No mood changes, alert and cooperative,seems happy AA is 0 Fall risk is moderate     03/26/2024   10:42 AM 08/29/2023    9:07 AM 06/13/2023   10:02 AM  Depression screen PHQ 2/9  Decreased Interest 2 3 0  Down, Depressed, Hopeless 1 3 0  PHQ - 2 Score 3 6 0  Altered sleeping 1 3   Tired, decreased energy 3 3   Change in appetite 0 1   Feeling bad or failure about yourself  0 1   Trouble concentrating 1 0   Moving slowly or fidgety/restless 0 0   Suicidal thoughts 0 0   PHQ-9 Score 8 14   Difficult doing work/chores  Very difficult    On meds     03/26/2024   10:42 AM 03/28/2023    9:39 AM 09/26/2022    9:39 AM 05/15/2022    1:52 PM  GAD 7 : Generalized Anxiety Score  Nervous, Anxious, on Edge 0 0 0 1  Control/stop worrying 1 2 2 2   Worry too much - different things 1 2 2 2   Trouble relaxing 0 0 1 2  Restless 0 0 0 1  Easily annoyed or irritable 2 0 3 2  Afraid - awful might happen 0 0 1 1  Total GAD 7  Score 4 4 9 11   Anxiety Difficulty  Somewhat difficult Somewhat difficult     Upstream - 03/26/24 1056       Pregnancy Intention Screening   Does the patient want to become pregnant in the next year? Ok Either Way    Does the patient's partner want to become pregnant in the next year? Ok Either Way    Would the patient like to discuss contraceptive options today? No      Contraception Wrap Up   Current Method Abstinence    End Method Abstinence    Contraception Counseling Provided No           Examination chaperoned by Clarita Salt LPN  Impression and plan: 1. Encounter for well woman exam with routine gynecological exam (Primary) Pap and physical in 1 year Labs with PCP Had cologuard last year    2. Perimenopause Discussed perimenopause Review handout  3. Irregular periods Skipping periods, started this year   4. Hot flashes Has before periods   5. Night sweats Has before periods   6. Essential hypertension Continue meds and follow up with PCP, has been good at home   7. Screening mammogram for breast cancer Scheduled for today at 11:45 am at [facility]  - MM 3D SCREENING MAMMOGRAM BILATERAL BREAST; Future

## 2024-03-27 ENCOUNTER — Ambulatory Visit: Payer: Commercial Managed Care - PPO | Admitting: Family Medicine

## 2024-03-31 ENCOUNTER — Encounter: Payer: Self-pay | Admitting: Rheumatology

## 2024-03-31 ENCOUNTER — Other Ambulatory Visit: Payer: Self-pay | Admitting: Family Medicine

## 2024-03-31 DIAGNOSIS — Z111 Encounter for screening for respiratory tuberculosis: Secondary | ICD-10-CM

## 2024-03-31 DIAGNOSIS — Z9225 Personal history of immunosupression therapy: Secondary | ICD-10-CM

## 2024-03-31 DIAGNOSIS — Z79899 Other long term (current) drug therapy: Secondary | ICD-10-CM

## 2024-04-01 ENCOUNTER — Other Ambulatory Visit: Payer: Self-pay

## 2024-04-01 ENCOUNTER — Ambulatory Visit: Payer: Self-pay | Admitting: Adult Health

## 2024-04-01 ENCOUNTER — Other Ambulatory Visit (HOSPITAL_COMMUNITY): Payer: Self-pay

## 2024-04-01 MED ORDER — TRAZODONE HCL 50 MG PO TABS
25.0000 mg | ORAL_TABLET | Freq: Every evening | ORAL | 1 refills | Status: AC | PRN
Start: 2024-04-01 — End: ?
  Filled 2024-04-01: qty 30, 60d supply, fill #0

## 2024-04-02 ENCOUNTER — Ambulatory Visit: Admitting: Family Medicine

## 2024-04-02 ENCOUNTER — Encounter: Payer: Self-pay | Admitting: Family Medicine

## 2024-04-02 VITALS — BP 134/88 | Ht 65.0 in | Wt 224.0 lb

## 2024-04-02 DIAGNOSIS — M5442 Lumbago with sciatica, left side: Secondary | ICD-10-CM | POA: Diagnosis not present

## 2024-04-02 DIAGNOSIS — M5441 Lumbago with sciatica, right side: Secondary | ICD-10-CM | POA: Diagnosis not present

## 2024-04-02 DIAGNOSIS — Z79899 Other long term (current) drug therapy: Secondary | ICD-10-CM | POA: Diagnosis not present

## 2024-04-02 DIAGNOSIS — G8929 Other chronic pain: Secondary | ICD-10-CM | POA: Diagnosis not present

## 2024-04-02 DIAGNOSIS — I10 Essential (primary) hypertension: Secondary | ICD-10-CM | POA: Diagnosis not present

## 2024-04-02 NOTE — Patient Instructions (Addendum)
 Continue your medications.  I feel that this is coming from your back.  Pain management has mentioned injection.  Follow up in 6 months.

## 2024-04-03 ENCOUNTER — Ambulatory Visit: Payer: Self-pay | Admitting: Physician Assistant

## 2024-04-03 LAB — CMP14+EGFR
ALT: 23 IU/L (ref 0–32)
AST: 28 IU/L (ref 0–40)
Albumin: 3.9 g/dL (ref 3.9–4.9)
Alkaline Phosphatase: 89 IU/L (ref 44–121)
BUN/Creatinine Ratio: 15 (ref 9–23)
BUN: 13 mg/dL (ref 6–24)
Bilirubin Total: 0.3 mg/dL (ref 0.0–1.2)
CO2: 23 mmol/L (ref 20–29)
Calcium: 9.2 mg/dL (ref 8.7–10.2)
Chloride: 103 mmol/L (ref 96–106)
Creatinine, Ser: 0.86 mg/dL (ref 0.57–1.00)
Globulin, Total: 2.9 g/dL (ref 1.5–4.5)
Glucose: 87 mg/dL (ref 70–99)
Potassium: 4.4 mmol/L (ref 3.5–5.2)
Sodium: 140 mmol/L (ref 134–144)
Total Protein: 6.8 g/dL (ref 6.0–8.5)
eGFR: 83 mL/min/1.73 (ref >59)

## 2024-04-03 LAB — CBC WITH DIFFERENTIAL/PLATELET
Basophils Absolute: 0 x10E3/uL (ref 0.0–0.2)
Basos: 1 %
EOS (ABSOLUTE): 0.1 x10E3/uL (ref 0.0–0.4)
Eos: 3 %
Hematocrit: 36.7 % (ref 34.0–46.6)
Hemoglobin: 12.1 g/dL (ref 11.1–15.9)
Immature Grans (Abs): 0 x10E3/uL (ref 0.0–0.1)
Immature Granulocytes: 0 %
Lymphocytes Absolute: 1.5 x10E3/uL (ref 0.7–3.1)
Lymphs: 37 %
MCH: 30.1 pg (ref 26.6–33.0)
MCHC: 33 g/dL (ref 31.5–35.7)
MCV: 91 fL (ref 79–97)
Monocytes Absolute: 0.6 x10E3/uL (ref 0.1–0.9)
Monocytes: 15 %
Neutrophils Absolute: 1.8 x10E3/uL (ref 1.4–7.0)
Neutrophils: 44 %
Platelets: 231 x10E3/uL (ref 150–450)
RBC: 4.02 x10E6/uL (ref 3.77–5.28)
RDW: 12.5 % (ref 11.7–15.4)
WBC: 4.1 x10E3/uL (ref 3.4–10.8)

## 2024-04-03 NOTE — Progress Notes (Signed)
 CBC and CMP WNL

## 2024-04-05 MED ORDER — AMLODIPINE BESYLATE 5 MG PO TABS
5.0000 mg | ORAL_TABLET | Freq: Every day | ORAL | 3 refills | Status: AC
  Filled 2024-04-05 – 2024-06-09 (×2): qty 90, 90d supply, fill #0
  Filled 2024-09-03: qty 90, 90d supply, fill #1

## 2024-04-05 MED ORDER — HYDROCHLOROTHIAZIDE 12.5 MG PO TABS
12.5000 mg | ORAL_TABLET | Freq: Every day | ORAL | 3 refills | Status: AC
  Filled 2024-04-05 – 2024-06-09 (×2): qty 90, 90d supply, fill #0
  Filled 2024-09-03: qty 90, 90d supply, fill #1

## 2024-04-05 NOTE — Assessment & Plan Note (Signed)
 Stable. Continue hydrochlorothiazide  and Amlodipine .

## 2024-04-05 NOTE — Progress Notes (Signed)
 Subjective:  Patient ID: Karen Burton, female    DOB: February 15, 1976  Age: 48 y.o. MRN: [id number]  CC:   Chief Complaint  Patient presents with   Medical Management of Chronic Issues   Ankle Injury    Clemens in May at work, left ankle    HPI:  48 year old female presents for follow up.  HTN is stable on Amlodipine  and hydrochlorothiazide .  Patient reports recent ankle injury. Doing okay currently.   She reports ongoing burning pain in her legs. Worse with activity. She follows with pain management. Likely coming from the low back. Will discuss today.   Needs labs today.    Patient Active Problem List   Diagnosis Date Noted   Night sweats 03/26/2024   Irregular periods 03/26/2024   Perimenopause 03/26/2024   Hot flashes 03/26/2024   Chronic low back pain with sciatica 03/28/2023   Obesity (BMI 30-39.9) 03/28/2023   Healthcare maintenance 09/26/2022   Insomnia 09/06/2021   Fibromyalgia 11/06/2016   Uterine leiomyoma 12/20/2015   Essential hypertension 01/12/2013   Rheumatoid arthritis (HCC) 01/12/2013    Social Hx   Social History   Socioeconomic History   Marital status: Single    Spouse name: Not on file   Number of children: 0   Years of education: Not on file   Highest education level: Associate degree: occupational, Scientist, product/process development, or vocational program  Occupational History   Not on file  Tobacco Use   Smoking status: Never    Passive exposure: Never   Smokeless tobacco: Never  Vaping Use   Vaping status: Never Used  Substance and Sexual Activity   Alcohol use: No   Drug use: Never   Sexual activity: Not Currently    Birth control/protection: None, Abstinence  Other Topics Concern   Not on file  Social History Narrative   Not on file   Social Drivers of Health   Financial Resource Strain: Low Risk  (04/01/2024)   Overall Financial Resource Strain (CARDIA)    Difficulty of Paying Living Expenses: Not hard at all  Food Insecurity: No Food  Insecurity (04/01/2024)   Hunger Vital Sign    Worried About Running Out of Food in the Last Year: Never true    Ran Out of Food in the Last Year: Never true  Transportation Needs: No Transportation Needs (04/01/2024)   PRAPARE - Administrator, Civil Service (Medical): No    Lack of Transportation (Non-Medical): No  Physical Activity: Insufficiently Active (04/01/2024)   Exercise Vital Sign    Days of Exercise per Week: 2 days    Minutes of Exercise per Session: 20 min  Stress: No Stress Concern Present (04/01/2024)   [employer] of Occupational Health - Occupational Stress Questionnaire    Feeling of Stress: Not at all  Recent Concern: Stress - Stress Concern Present (03/26/2024)   [employer] of Occupational Health - Occupational Stress Questionnaire    Feeling of Stress: Very much  Social Connections: Socially Isolated (04/01/2024)   Social Connection and Isolation Panel    Frequency of Communication with Friends and Family: More than three times a week    Frequency of Social Gatherings with Friends and Family: Three times a week    Attends Religious Services: Never    Active Member of Clubs or Organizations: No    Attends Engineer, structural: Not on file    Marital Status: Never married    Review of Systems Per HPI  Objective:  BP 134/88   Ht 5' 5 (1.651 m)   Wt 224 lb (101.6 kg)   LMP 03/16/2024   BMI 37.28 kg/m      04/02/2024   10:32 AM 03/26/2024   11:16 AM 03/26/2024   10:48 AM  BP/Weight  Systolic BP 134 143 140  Diastolic BP 88 90 89  Wt. (Lbs) 224  226  BMI 37.28 kg/m2  37.61 kg/m2    Physical Exam Vitals and nursing note reviewed.  Constitutional:      General: She is not in acute distress.    Appearance: Normal appearance.  HENT:     Head: Normocephalic and atraumatic.  Cardiovascular:     Rate and Rhythm: Normal rate and regular rhythm.  Pulmonary:     Effort: Pulmonary effort is normal.     Breath sounds: Normal breath  sounds. No wheezing, rhonchi or rales.  Neurological:     Mental Status: She is alert.  Psychiatric:        Mood and Affect: Mood normal.        Behavior: Behavior normal.     Lab Results  Component Value Date   WBC 4.1 04/02/2024   HGB 12.1 04/02/2024   HCT 36.7 04/02/2024   PLT 231 04/02/2024   GLUCOSE 87 04/02/2024   CHOL 152 11/20/2023   TRIG 89 11/20/2023   HDL 51 11/20/2023   LDLCALC 83 11/20/2023   ALT 23 04/02/2024   AST 28 04/02/2024   NA 140 04/02/2024   K 4.4 04/02/2024   CL 103 04/02/2024   CREATININE 0.86 04/02/2024   BUN 13 04/02/2024   CO2 23 04/02/2024   TSH 3.190 03/06/2022   HGBA1C 5.5 03/06/2022     Assessment & Plan:  Essential hypertension Assessment & Plan: Stable. Continue hydrochlorothiazide  and Amlodipine .  Orders: -     amLODIPine  Besylate; Take 1 tablet (5 mg total) by mouth daily for blood pressure.  Dispense: 90 tablet; Refill: 3 -     hydroCHLOROthiazide ; Take 1 tablet (12.5 mg total) by mouth daily.  Dispense: 90 tablet; Refill: 3  Chronic low back pain with bilateral sciatica, unspecified back pain laterality Assessment & Plan: Prior MRI reviewed. Pain management has briefly discussed injection. Advised patient to discuss with them at follow up.    Follow-up:  6 months  Katelynn Heidler Bluford DO [facility]

## 2024-04-05 NOTE — Assessment & Plan Note (Signed)
 Prior MRI reviewed. Pain management has briefly discussed injection. Advised patient to discuss with them at follow up.

## 2024-04-06 ENCOUNTER — Other Ambulatory Visit (HOSPITAL_COMMUNITY): Payer: Self-pay

## 2024-04-07 ENCOUNTER — Other Ambulatory Visit (HOSPITAL_COMMUNITY): Payer: Self-pay

## 2024-04-07 ENCOUNTER — Other Ambulatory Visit: Payer: Self-pay | Admitting: Family Medicine

## 2024-04-07 ENCOUNTER — Other Ambulatory Visit: Payer: Self-pay

## 2024-04-07 MED ORDER — LOSARTAN POTASSIUM 50 MG PO TABS
50.0000 mg | ORAL_TABLET | Freq: Every day | ORAL | 3 refills | Status: AC
  Filled 2024-04-07: qty 90, 90d supply, fill #0
  Filled 2024-07-05: qty 90, 90d supply, fill #1

## 2024-04-07 NOTE — Progress Notes (Unsigned)
 Office Visit Note  Patient: Karen Burton             Date of Birth: 11-20-1975           MRN: [id number]             PCP: Cook, Jayce G, DO Referring: Cook, Jayce G, DO Visit Date: 04/21/2024 Occupation: @GUAROCC @  Subjective:  Lower back pain   History of Present Illness: Karen Burton is a 48 y.o. female with history of seropositive rheumatoid arthritis and fibromyalgia.  Patient remains on Enbrel  50 mg sq injection once weekly.  She reports that she was recently about 2 weeks overdue for her Enbrel  dose due to requiring a refill.  She denies any signs or symptoms of a rheumatoid arthritis flare recently.  She continues to have generalized myalgias and muscle tenderness due to fibromyalgia.  She remains under the care of pain management and has been taking naltrexone 2 mg daily.  She also remains on lyrica , Cymbalta , and tramadol  as prescribed.  Patient states that she had a severe ankle sprain involving the left ankle in May 2025 and has been under the care of [facility] for the [employer]. case.  She continues to use a brace on the left ankle.  She has been taking meloxicam  sparingly for symptomatic relief. Patient states that her primary concern has been the level of pain in her lower back.  She has difficulty sweeping, mopping, and vacuuming due to severity of symptoms.  Patient is considering proceeding with an injection and may consider increasing the dose of naltrexone as recommended by pain management.  Activities of Daily Living:  Patient reports morning stiffness for 2-3 hours.   Patient Reports nocturnal pain.  Difficulty dressing/grooming: Denies Difficulty climbing stairs: Reports Difficulty getting out of chair: Reports Difficulty using hands for taps, buttons, cutlery, and/or writing: Denies  Review of Systems  Constitutional:  Positive for fatigue.  HENT:  Positive for mouth dryness. Negative for mouth sores.   Eyes:  Positive for  dryness.  Respiratory:  Negative for shortness of breath.   Cardiovascular:  Negative for chest pain and palpitations.  Gastrointestinal:  Positive for constipation. Negative for blood in stool and diarrhea.  Endocrine: Negative for increased urination.  Genitourinary:  Negative for involuntary urination.  Musculoskeletal:  Positive for joint pain, joint pain, joint swelling, myalgias, muscle weakness, morning stiffness, muscle tenderness and myalgias. Negative for gait problem.  Skin:  Positive for sensitivity to sunlight. Negative for color change, rash and hair loss.  Allergic/Immunologic: Positive for susceptible to infections.  Neurological:  Positive for headaches. Negative for dizziness.  Hematological:  Positive for swollen glands.  Psychiatric/Behavioral:  Positive for depressed mood and sleep disturbance. The patient is not nervous/anxious.     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   Obesity (BMI 30-39.9) 03/28/2023   Healthcare maintenance 09/26/2022   Insomnia 09/06/2021   Fibromyalgia 11/06/2016   Uterine leiomyoma 12/20/2015   Essential hypertension 01/12/2013   Rheumatoid arthritis (HCC) 01/12/2013    Past Medical History:  Diagnosis Date   Anemia    Dysmenorrhea 12/20/2015   Eczema    mild   Fibroids 12/20/2015   Fibromyalgia    GERD (gastroesophageal reflux disease)    Hypertension    Menorrhagia with regular  cycle 12/20/2015   PONV (postoperative nausea and vomiting)    Rash    Reflux    Rheumatoid arthritis (HCC)    Rhinitis    chonic   Vitamin D deficiency    Vulvar itching 12/20/2015    Family History  Problem Relation Age of Onset   Breast cancer Mother 61   Diabetes Mother    Hypertension Mother    Asthma Mother    Cancer Mother        breast   Thyroid   disease Mother    Arthritis Mother    Yvone' disease Mother    Fibroids Sister    Cancer Maternal Grandmother        pancreatic   COPD Maternal Aunt    Asthma Maternal Aunt    Gout Maternal Aunt    Past Surgical History:  Procedure Laterality Date   CHOLECYSTECTOMY N/A 02/08/2022   Procedure: LAPAROSCOPIC CHOLECYSTECTOMY;  Surgeon: Evonnie Dorothyann LABOR, DO;  Location: [facility];  Service: General;  Laterality: N/A;   ESOPHAGOGASTRODUODENOSCOPY ENDOSCOPY     IR GENERIC HISTORICAL  02/07/2016   IR RADIOLOGIST EVAL & MGMT 02/07/2016 Norleen Roulette, MD [facility] INTERV RAD   WISDOM TOOTH EXTRACTION     Social History   Social History Narrative   Not on file   Immunization History  Administered Date(s) Administered   DT (Pediatric) 02/20/2013   Influenza-Unspecified 07/18/2009, 05/16/2016, 06/03/2017, 04/07/2018, 04/27/2021   PFIZER(Purple Top)SARS-COV-2 Vaccination 04/01/2020, 04/22/2020   Pneumococcal Polysaccharide-23 05/08/2007     Objective: Vital Signs: BP 131/88   Pulse (!) 105   Temp 97.8 F (36.6 C)   Resp 14   Ht 5' 5 (1.651 m)   Wt 224 lb (101.6 kg)   LMP 03/30/2024 (Exact Date)   BMI 37.28 kg/m    Physical Exam Vitals and nursing note reviewed.  Constitutional:      Appearance: She is well-developed.  HENT:     Head: Normocephalic and atraumatic.  Eyes:     Conjunctiva/sclera: Conjunctivae normal.  Cardiovascular:     Rate and Rhythm: Normal rate and regular rhythm.     Heart sounds: Normal heart sounds.  Pulmonary:     Effort: Pulmonary effort is normal.     Breath sounds: Normal breath sounds.  Abdominal:     General: Bowel sounds are normal.     Palpations: Abdomen is soft.  Musculoskeletal:     Cervical back: Normal range of motion.  Lymphadenopathy:     Cervical: No cervical adenopathy.  Skin:    General: Skin is warm and dry.     Capillary Refill: Capillary refill takes less than 2 seconds.  Neurological:     Mental Status: She is alert and  oriented to person, place, and time.  Psychiatric:        Behavior: Behavior normal.      Musculoskeletal Exam: Generalized hyperalgesia and positive tender points on exam.  C-spine has good range of motion.  Trapezius muscle tension tenderness.  Limited mobility and discomfort of the lumbar spine.  Tenderness over the left piriformis muscle.  Shoulder joints, elbow joints, wrist joints, MCPs, PIPs, DIPs have good range of motion with no synovitis.  Complete fist formation bilaterally.  Hip joints have good range of motion with no groin pain.  Knee joints have good range of motion no warmth or effusion.  Left ankle is in a brace. Right ankle has good ROM with no tenderness or joint swelling.    CDAI Exam: CDAI Score: --  Patient Global: --; Provider Global: -- Swollen: --; Tender: -- Joint Exam 04/21/2024   No joint exam has been documented for this visit   There is currently no information documented on the homunculus. Go to the Rheumatology activity and complete the homunculus joint exam.  Investigation: No additional findings.  Imaging: MM 3D SCREENING MAMMOGRAM BILATERAL BREAST Result Date: 04/01/2024 CLINICAL DATA:  Screening. EXAM: DIGITAL SCREENING BILATERAL MAMMOGRAM WITH TOMOSYNTHESIS AND CAD TECHNIQUE: Bilateral screening digital craniocaudal and mediolateral oblique mammograms were obtained. Bilateral screening digital breast tomosynthesis was performed. The images were evaluated with computer-aided detection. COMPARISON:  Previous exam(s). ACR Breast Density Category c: The breasts are heterogeneously dense, which may obscure small masses. FINDINGS: There are no findings suspicious for malignancy. IMPRESSION: No mammographic evidence of malignancy. A result letter of this screening mammogram will be mailed directly to the patient. RECOMMENDATION: Screening mammogram in one year. (Code:SM-B-01Y) BI-RADS CATEGORY  1: Negative. Electronically Signed   By: Alm Parkins M.D.   On:  04/01/2024 07:29    Recent Labs: Lab Results  Component Value Date   WBC 4.1 04/02/2024   HGB 12.1 04/02/2024   PLT 231 04/02/2024   NA 140 04/02/2024   K 4.4 04/02/2024   CL 103 04/02/2024   CO2 23 04/02/2024   GLUCOSE 87 04/02/2024   BUN 13 04/02/2024   CREATININE 0.86 04/02/2024   BILITOT 0.3 04/02/2024   ALKPHOS 89 04/02/2024   AST 28 04/02/2024   ALT 23 04/02/2024   PROT 6.8 04/02/2024   ALBUMIN 3.9 04/02/2024   CALCIUM 9.2 04/02/2024   GFRAA 92 06/10/2020   QFTBGOLDPLUS NEGATIVE 06/19/2023    Speciality Comments: TB Gold: 05/07/2022 Neg   Prior therapy includes: methotrexate and Plaquenil (neutropenia) and Humira  (painful injections)  Procedures:  No procedures performed Allergies: Ceftin [cefuroxime axetil], Humira  [adalimumab ], and Methotrexate derivatives     Assessment / Plan:     Visit Diagnoses: Rheumatoid arthritis involving multiple sites with positive rheumatoid factor (HCC): She has no synovitis on examination today.  Her rheumatoid arthritis remains stable on Enbrel  50 mg sq injections once weekly.  She is tolerating Enbrel  without any side effects or injection site reactions.  She was recently 2 weeks overdue for her dose of Enbrel  which she attributes to requiring a refill.  She injected Enbrel  last week and is not experiencing any symptoms of a flare. She remains under the care of pain management.  Her primary concern remains with the level of pain in her lower back.  She is considering increasing the dose of naltrexone from 2 mg to 4 mg daily.  She remains on Lyrica , Cymbalta , and tramadol  for symptomatic relief.  She continues with generalized hyperalgesia and positive tender points due to fibromyalgia. She will remain on Enbrel  as monotherapy for management of rheumatoid arthritis.  She was advised to notify us  if she develops any signs or symptoms of a flare.  She will follow-up in the office in 5 months or sooner if needed.  High risk medication use -  Enbrel  50 mg sq injection once weekly. CBC and CMP updated on 04/02/24.  Her next lab work will be due in December and every 3 months.  TB gold negative 06/19/23.  Lipid panel updated on 11/20/23.  Planning to get annual flu vaccine.  Discussed the importance of holding enbrel  if she develops signs or symptoms of an infection and to resume once the infection has completely cleared.   Discussed importance of annual skin cancer screening. She  is followed by dermatology.     Spondylosis of lumbar spine: Chronic pain.  Patient remains under the care of pain management.  She has been taking naltrexone 2 mg daily and remains on Lyrica , Cymbalta , and tramadol .  Patient has had increased difficulty performing responsibilities at home due to discomfort in her lower back.  She is considering proceeding with an injection and may consider increasing the dose of naltrexone to 4 mg as discussed with pain management.  Fibromyalgia: She has generalized hyperalgesia and positive tender points on exam.  She remains under the care of pain management.  She is taking Lyrica  and Cymbalta  as prescribed.  Other fatigue: Chronic, secondary to fibromyalgia and insomnia.  Discussed the importance of regular exercise and good sleep hygiene.   History of insomnia: She takes Ambien  5 mg 1 tablet at bedtime as needed for insomnia.  Other medical conditions are listed as follows:  History of gastroesophageal reflux (GERD)  History of hypertension: Blood pressure is 131/88 today in the office.  Elevated LFTs: AST and ALT were within normal limits on 04/02/2024.  Elevated CK - History of elevated CK.  Patient experiences muscle fatigue if walking prolonged distances.  She experiences a burning sensation primarily affecting both thigh muscles.  No muscular weakness was noted.  Plan to recheck CK with her next lab work --Future order placed today.  Plan: CK  Orders: Orders Placed This Encounter  Procedures   CK   Meds ordered  this encounter  Medications   methocarbamol  (ROBAXIN ) 500 MG tablet    Sig: Take 1 tablet (500 mg total) by mouth 2 (two) times daily as needed for muscle spasms.    Dispense:  30 tablet    Refill:  0    Follow-Up Instructions: Return in about 5 months (around 09/21/2024) for Rheumatoid arthritis, Fibromyalgia.   Waddell CHRISTELLA Craze, PA-C  Note - This record has been created using Dragon software.  Chart creation errors have been sought, but may not always  have been located. Such creation errors do not reflect on  the standard of medical care.

## 2024-04-11 ENCOUNTER — Ambulatory Visit
Admission: EM | Admit: 2024-04-11 | Discharge: 2024-04-11 | Disposition: A | Discharge status: Home / Self Care | Attending: Nurse Practitioner | Admitting: Nurse Practitioner

## 2024-04-11 ENCOUNTER — Encounter: Payer: Self-pay | Admitting: Emergency Medicine

## 2024-04-11 ENCOUNTER — Other Ambulatory Visit (HOSPITAL_COMMUNITY): Payer: Self-pay

## 2024-04-11 DIAGNOSIS — H6993 Unspecified Eustachian tube disorder, bilateral: Secondary | ICD-10-CM | POA: Diagnosis not present

## 2024-04-11 MED ORDER — PREDNISONE 20 MG PO TABS: 40.0000 mg | ORAL_TABLET | Freq: Every day | ORAL | 0 refills | Status: AC

## 2024-04-11 NOTE — ED Triage Notes (Signed)
 States has been feeling fluid in both ears x several weeks.  States on Wednesday ears felt like they were stopped up.  Continues to hear a wishing noise

## 2024-04-11 NOTE — ED Provider Notes (Signed)
 [facility]    CSN: [id number] Arrival date & time: 04/11/24  9182      History   Chief Complaint No chief complaint on file.   HPI Karen Burton is a 48 y.o. female.   The history is provided by the patient.   Patient presents for complaints of bilateral ear pressure, and fullness for the past several weeks.  Patient states that her ears feel as if they are stopped up.  She states she hears a swishing noise when she bends down or when she turns her head a certain way.  Patient states that she also has intermittent pain in the bilateral ears.  She states that she also has an area in her nose that is sore and irritated.  Patient states that she has been taking Sudafed and Zyrtec with minimal relief of her symptoms.  Patient states that she did mention this to her PCP approximately 2 weeks ago, but nothing was done.  She reports that she has seen ENT in the past.   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   Obesity (BMI 30-39.9) 03/28/2023   Healthcare maintenance 09/26/2022   Insomnia 09/06/2021   Fibromyalgia 11/06/2016   Uterine leiomyoma 12/20/2015   Essential hypertension 01/12/2013   Rheumatoid arthritis (HCC) 01/12/2013    Past Surgical History:  Procedure Laterality Date   CHOLECYSTECTOMY N/A 02/08/2022   Procedure: LAPAROSCOPIC CHOLECYSTECTOMY;  Surgeon: Evonnie Dorothyann LABOR, DO;  Location: [facility];  Service: General;  Laterality: N/A;   ESOPHAGOGASTRODUODENOSCOPY ENDOSCOPY     IR GENERIC HISTORICAL  02/07/2016   IR RADIOLOGIST EVAL & MGMT 02/07/2016 Norleen Roulette, MD [facility]   WISDOM TOOTH EXTRACTION      OB History     Gravida  1   Para      Term      Preterm      AB  1   Living         SAB      IAB      Ectopic      Multiple      Live Births               Home Medications    Prior to Admission medications   Medication Sig Start Date End Date Taking? Authorizing Provider  amLODipine  (NORVASC ) 5 MG tablet Take 1 tablet (5 mg total) by mouth daily for blood pressure. 04/05/24   Cook, Jayce G, DO  BIOTIN PO Take by mouth daily.    [provider]  Cholecalciferol (VITAMIN D-3 PO) Take by mouth daily.    [provider]  diclofenac  Sodium (VOLTAREN ) 1 % GEL Apply 2 grams topically to the affected area(s) 4 (four) times daily. 01/16/24     docusate sodium (COLACE) 100  MG capsule Take 100 mg by mouth daily.     [provider]  DULoxetine  (CYMBALTA ) 60 MG capsule Take 1 capsule (60 mg total) by mouth daily. 01/30/24   Cheryl Waddell HERO, PA-C  etanercept  (ENBREL  MINI) 50 MG/ML injection Inject 50 mg into the skin once a week. 03/12/24   Cheryl Waddell HERO, PA-C  hydrochlorothiazide  (HYDRODIURIL ) 12.5 MG tablet Take 1 tablet (12.5 mg total) by mouth daily. 04/05/24   Cook, Jayce G, DO  losartan  (COZAAR ) 50 MG tablet Take 1 tablet (50 mg total) by mouth daily. 04/07/24   Cook, Jayce G, DO  meloxicam  (MOBIC ) 7.5 MG tablet Take 1 tablet (7.5 mg total) by mouth daily as needed. 03/17/24     methocarbamol  (ROBAXIN ) 500 MG tablet Take 1 tablet (500 mg total) by mouth 2 (two) times daily as needed for muscle spasms. 11/20/23   Cheryl Waddell HERO, PA-C  Multiple Vitamins-Minerals (MULTIVITAMIN WOMEN PO) Take 1 tablet by mouth daily.    [provider]  NONFORMULARY OR COMPOUNDED ITEM Take 1 mg by mouth daily.  Naltrexone Patient taking differently: Take 2 mg by mouth daily. Naltrexone-takes 2 qhs 09/13/23   Urbano Albright, MD  pregabalin  (LYRICA ) 50 MG capsule Take 1 capsule (50 mg total) by mouth 3 (three) times daily. 02/21/24   Urbano Albright, MD  traMADol  (ULTRAM ) 50 MG tablet Take 1 tablet (50 mg total) by mouth every 8 (eight) hours as needed for severe pain (pain score 7-10). 02/23/24   Cook, Jayce G, DO  traZODone  (DESYREL ) 50 MG tablet Take 1/2 tablet (25 mg total) by mouth at bedtime as needed. 04/01/24   Cook, Jayce G, DO  tretinoin  (RETIN-A ) 0.05 % cream Apply a small amount to skin every night. Start 2-3x per week and increase use as tolerated. Do not use if pregnant. 02/27/24     zolpidem  (AMBIEN ) 5 MG tablet Take 1 tablet (5 mg total) by mouth at bedtime as needed for sleep. 03/06/22   Cook, Jayce G, DO    Family History Family History  Problem Relation Age of Onset   Breast cancer Mother 12   Diabetes Mother    Hypertension Mother    Asthma Mother    Cancer Mother        breast   Thyroid  disease Mother    Arthritis Mother    Yvone' disease Mother    Fibroids Sister    COPD Maternal Aunt    Asthma Maternal Aunt    Gout Maternal Aunt    Cancer Maternal Grandmother        pancreatic    Social History Social History   Tobacco Use   Smoking status: Never    Passive exposure: Never   Smokeless tobacco: Never  Vaping Use   Vaping status: Never Used  Substance Use Topics   Alcohol use: No   Drug use: Never     Allergies   Ceftin [cefuroxime axetil], Humira  [adalimumab ], and Methotrexate derivatives   Review of Systems Review of Systems Per HPI  Physical Exam Triage Vital Signs ED Triage Vitals  Encounter Vitals Group     BP 04/11/24 0829 131/86     Girls Systolic BP Percentile --      Girls Diastolic BP Percentile --      Boys Systolic BP Percentile --      Boys Diastolic BP Percentile --      Pulse Rate 04/11/24 0829 99     Resp 04/11/24 0829 18  Temp 04/11/24 0829 98.1 F (36.7 C)     Temp Source 04/11/24 0829 Oral     SpO2 04/11/24 0829 96 %     Weight --      Height --      Head Circumference --      Peak Flow --      Pain Score 04/11/24 0831 0     Pain Loc --      Pain Education --      Exclude from Growth Chart --    No data found.  Updated Vital Signs BP 131/86 (BP Location: Right Arm)   Pulse 99   Temp 98.1 F (36.7 C) (Oral)   Resp 18   LMP 04/10/2024 (Exact Date)   SpO2 96%   Visual Acuity Right Eye Distance:   Left Eye Distance:   Bilateral Distance:    Right Eye Near:   Left Eye Near:    Bilateral Near:     Physical Exam Vitals and nursing note reviewed.  Constitutional:      General: She is not in acute distress.    Appearance: Normal appearance.  HENT:     Head: Normocephalic.     Right Ear: Ear canal and external ear normal. A middle ear effusion is present.     Left Ear: Ear canal and external ear normal. A middle ear effusion is present.     Nose: Congestion present.     Right Turbinates: Enlarged and swollen.     Left Turbinates: Enlarged and swollen.     Mouth/Throat:     Lips: Pink.     Pharynx: Uvula midline. Postnasal drip present. No pharyngeal swelling or posterior oropharyngeal erythema.     Comments: Cobblestoning present to posterior oropharynx  Eyes:     Extraocular Movements: Extraocular movements intact.     Conjunctiva/sclera: Conjunctivae normal.     Pupils: Pupils are equal, round, and reactive to light.  Cardiovascular:     Rate and Rhythm: Normal rate and regular rhythm.     Pulses: Normal pulses.     Heart sounds: Normal heart sounds.  Pulmonary:     Effort: Pulmonary effort is normal. No respiratory distress.     Breath sounds: Normal breath sounds. No stridor. No wheezing, rhonchi or rales.  Abdominal:     General: Bowel sounds are normal.     Palpations: Abdomen is soft.     Tenderness: There is no abdominal tenderness.  Musculoskeletal:     Cervical back:  Normal range of motion.  Skin:    General: Skin is warm and dry.  Neurological:     General: No focal deficit present.     Mental Status: She is alert and oriented to person, place, and time.  Psychiatric:        Mood and Affect: Mood normal.        Behavior: Behavior normal.      UC Treatments / Results  Labs (all labs ordered are listed, but only abnormal results are displayed) Labs Reviewed - No data to display  EKG   Radiology No results found.  Procedures Procedures (including critical care time)  Medications Ordered in UC Medications - No data to display  Initial Impression / Assessment and Plan / UC Course  I have reviewed the triage vital signs and the nursing notes.  Pertinent labs & imaging results that were available during my care of the patient were reviewed by me and considered in my medical decision making (see  chart for details).  On exam, the patient's lung sounds are clear throughout, room air sats at 96%.  She does have bilateral middle ear effusions, consistent with a bilateral eustachian tube dysfunction.  She is currently taking cetirizine and Sudafed with minimal relief.  Will start patient on prednisone  40 mg for the next 5 days to help with eustachian tube inflammation.  Supportive care recommendations were provided and discussed with the patient to include fluids, rest, over-the-counter analgesics, and warm compresses to the ears.  Patient was advised if symptoms fail to improve, recommend follow-up with her PCP or ENT for further evaluation.  Patient was in agreement with this plan of care and verbalizes understanding.  All questions were answered.  Patient stable for discharge.   Final Clinical Impressions(s) / UC Diagnoses   Final diagnoses:  None   Discharge Instructions   None    ED Prescriptions   None    PDMP not reviewed this encounter.   Gilmer Etta PARAS, NP 04/11/24 [phone]

## 2024-04-11 NOTE — Discharge Instructions (Signed)
 Take medication as prescribed.  As discussed, you may continue Sudafed and cetirizine as directed. Increase fluids and allow for plenty of rest. You may take over-the-counter Tylenol  as needed for pain, fever, or general discomfort. Apply warm compresses to the ears as needed. Avoid entrance of water inside of the ears while symptoms persist. As discussed, if symptoms fail to improve with this treatment, recommend follow-up with your PCP or ENT for further evaluation. Follow-up as needed.

## 2024-04-14 ENCOUNTER — Other Ambulatory Visit (HOSPITAL_COMMUNITY): Payer: Self-pay

## 2024-04-14 ENCOUNTER — Other Ambulatory Visit: Payer: Self-pay

## 2024-04-14 NOTE — Progress Notes (Signed)
 Specialty Pharmacy Refill Coordination Note  Spoke with Karen Burton  Qpxpdyj [name] is a 47 y.o. female contacted today regarding refills of specialty medication(s) Etanercept  (Enbrel  Mini)  Doses on hand: 0  Injection date: 04/15/24   Patient requested: Delivery   Delivery date: 04/15/24   Verified address: [address] [place] [address]  Medication will be filled on 04/14/24.

## 2024-04-20 ENCOUNTER — Telehealth: Payer: Self-pay | Admitting: *Deleted

## 2024-04-20 ENCOUNTER — Encounter: Payer: Self-pay | Admitting: Physical Medicine & Rehabilitation

## 2024-04-20 NOTE — Telephone Encounter (Signed)
 [facility] ist attempting to request for NALTREXONE HCL (LF) 2MG  CAPSULE 2MG  CAPSULE .

## 2024-04-21 ENCOUNTER — Other Ambulatory Visit (HOSPITAL_COMMUNITY): Payer: Self-pay

## 2024-04-21 ENCOUNTER — Encounter: Payer: Self-pay | Admitting: Physician Assistant

## 2024-04-21 ENCOUNTER — Other Ambulatory Visit: Payer: Self-pay

## 2024-04-21 ENCOUNTER — Ambulatory Visit: Attending: Physician Assistant | Admitting: Physician Assistant

## 2024-04-21 VITALS — BP 131/88 | HR 105 | Temp 97.8°F | Resp 14 | Ht 65.0 in | Wt 224.0 lb

## 2024-04-21 DIAGNOSIS — R7989 Other specified abnormal findings of blood chemistry: Secondary | ICD-10-CM

## 2024-04-21 DIAGNOSIS — Z79899 Other long term (current) drug therapy: Secondary | ICD-10-CM

## 2024-04-21 DIAGNOSIS — M0579 Rheumatoid arthritis with rheumatoid factor of multiple sites without organ or systems involvement: Secondary | ICD-10-CM | POA: Diagnosis not present

## 2024-04-21 DIAGNOSIS — M797 Fibromyalgia: Secondary | ICD-10-CM | POA: Diagnosis not present

## 2024-04-21 DIAGNOSIS — Z87898 Personal history of other specified conditions: Secondary | ICD-10-CM | POA: Diagnosis not present

## 2024-04-21 DIAGNOSIS — M51369 Other intervertebral disc degeneration, lumbar region without mention of lumbar back pain or lower extremity pain: Secondary | ICD-10-CM

## 2024-04-21 DIAGNOSIS — Z8679 Personal history of other diseases of the circulatory system: Secondary | ICD-10-CM | POA: Diagnosis not present

## 2024-04-21 DIAGNOSIS — Z8719 Personal history of other diseases of the digestive system: Secondary | ICD-10-CM

## 2024-04-21 DIAGNOSIS — R5383 Other fatigue: Secondary | ICD-10-CM

## 2024-04-21 DIAGNOSIS — M47816 Spondylosis without myelopathy or radiculopathy, lumbar region: Secondary | ICD-10-CM | POA: Diagnosis not present

## 2024-04-21 DIAGNOSIS — R748 Abnormal levels of other serum enzymes: Secondary | ICD-10-CM

## 2024-04-21 MED ORDER — SUZETRIGINE 50 MG PO TABS
50.0000 mg | ORAL_TABLET | Freq: Two times a day (BID) | ORAL | 0 refills | Status: DC | PRN
  Filled 2024-04-21: qty 30, 15d supply, fill #0

## 2024-04-21 MED ORDER — METHOCARBAMOL 500 MG PO TABS
500.0000 mg | ORAL_TABLET | Freq: Two times a day (BID) | ORAL | 0 refills | Status: AC | PRN
  Filled 2024-04-21: qty 30, 15d supply, fill #0

## 2024-04-21 NOTE — Patient Instructions (Signed)

## 2024-04-22 ENCOUNTER — Ambulatory Visit: Admitting: Physician Assistant

## 2024-04-22 ENCOUNTER — Other Ambulatory Visit (HOSPITAL_COMMUNITY): Payer: Self-pay

## 2024-04-22 ENCOUNTER — Other Ambulatory Visit: Payer: Self-pay

## 2024-05-04 ENCOUNTER — Other Ambulatory Visit: Payer: Self-pay | Admitting: Physician Assistant

## 2024-05-04 ENCOUNTER — Other Ambulatory Visit (HOSPITAL_COMMUNITY): Payer: Self-pay

## 2024-05-05 ENCOUNTER — Other Ambulatory Visit: Payer: Self-pay

## 2024-05-05 ENCOUNTER — Other Ambulatory Visit (HOSPITAL_COMMUNITY): Payer: Self-pay

## 2024-05-05 MED ORDER — DULOXETINE HCL 60 MG PO CPEP
60.0000 mg | ORAL_CAPSULE | Freq: Every day | ORAL | 2 refills | Status: DC
  Filled 2024-05-05 (×2): qty 30, 30d supply, fill #0
  Filled 2024-06-09: qty 30, 30d supply, fill #1
  Filled 2024-07-05: qty 30, 30d supply, fill #2

## 2024-05-05 NOTE — Telephone Encounter (Signed)
 Last Fill: 01/30/2024  Next Visit: 09/23/2024  Last Visit: 04/21/2024  DX: Fibromyalgia   Current Dose per office note on 04/21/2024: She is taking Lyrica  and Cymbalta  as prescribed.   Okay to refill cymbalta ?

## 2024-05-06 ENCOUNTER — Other Ambulatory Visit: Payer: Self-pay

## 2024-05-06 ENCOUNTER — Other Ambulatory Visit (HOSPITAL_COMMUNITY): Payer: Self-pay

## 2024-05-06 ENCOUNTER — Other Ambulatory Visit: Payer: Self-pay | Admitting: Physician Assistant

## 2024-05-06 DIAGNOSIS — M0579 Rheumatoid arthritis with rheumatoid factor of multiple sites without organ or systems involvement: Secondary | ICD-10-CM

## 2024-05-06 MED ORDER — ENBREL MINI 50 MG/ML ~~LOC~~ SOCT
50.0000 mg | SUBCUTANEOUS | 2 refills | Status: DC
  Filled 2024-05-06 – 2024-05-07 (×2): qty 4, 28d supply, fill #0
  Filled 2024-06-09: qty 4, 28d supply, fill #1
  Filled 2024-07-03 – 2024-07-17 (×3): qty 4, 28d supply, fill #2

## 2024-05-06 NOTE — Telephone Encounter (Signed)
 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   Okay to refill Enbrel ?

## 2024-05-07 ENCOUNTER — Other Ambulatory Visit: Payer: Self-pay

## 2024-05-07 ENCOUNTER — Other Ambulatory Visit (HOSPITAL_COMMUNITY): Payer: Self-pay

## 2024-05-07 ENCOUNTER — Encounter (INDEPENDENT_AMBULATORY_CARE_PROVIDER_SITE_OTHER): Payer: Self-pay

## 2024-05-07 DIAGNOSIS — R42 Dizziness and giddiness: Secondary | ICD-10-CM | POA: Diagnosis not present

## 2024-05-07 DIAGNOSIS — H93293 Other abnormal auditory perceptions, bilateral: Secondary | ICD-10-CM | POA: Diagnosis not present

## 2024-05-07 DIAGNOSIS — H9313 Tinnitus, bilateral: Secondary | ICD-10-CM | POA: Diagnosis not present

## 2024-05-07 NOTE — Progress Notes (Signed)
 Specialty Pharmacy Refill Coordination Note  MyChart Questionnaire Submission  Karen Burton is a 48 y.o. female contacted today regarding refills of specialty medication(s) Enbrel .  Doses on hand: 2 (05/08/24 & 05/15/24)  Injection date: 05/22/24  Patient requested: (Patient-Rptd) Delivery   Delivery date: 05/12/24  Verified address: [address] [place] [address]  Medication will be filled on 05/11/24.

## 2024-05-08 ENCOUNTER — Other Ambulatory Visit: Payer: Self-pay

## 2024-05-14 ENCOUNTER — Other Ambulatory Visit (HOSPITAL_COMMUNITY): Payer: Self-pay

## 2024-05-14 ENCOUNTER — Other Ambulatory Visit: Payer: Self-pay

## 2024-05-14 DIAGNOSIS — S93402A Sprain of unspecified ligament of left ankle, initial encounter: Secondary | ICD-10-CM | POA: Diagnosis not present

## 2024-05-14 DIAGNOSIS — M7672 Peroneal tendinitis, left leg: Secondary | ICD-10-CM | POA: Diagnosis not present

## 2024-05-14 MED ORDER — MELOXICAM 7.5 MG PO TABS
7.5000 mg | ORAL_TABLET | Freq: Every day | ORAL | 1 refills | Status: AC | PRN
  Filled 2024-05-14: qty 30, 30d supply, fill #0
  Filled 2024-07-24: qty 30, 30d supply, fill #1

## 2024-05-25 ENCOUNTER — Encounter: Attending: Physical Medicine & Rehabilitation | Admitting: Physical Medicine & Rehabilitation

## 2024-05-25 ENCOUNTER — Encounter: Payer: Self-pay | Admitting: Physical Medicine & Rehabilitation

## 2024-05-25 VITALS — BP 136/93 | HR 80 | Ht 65.0 in | Wt 230.0 lb

## 2024-05-25 DIAGNOSIS — M0579 Rheumatoid arthritis with rheumatoid factor of multiple sites without organ or systems involvement: Secondary | ICD-10-CM | POA: Diagnosis not present

## 2024-05-25 DIAGNOSIS — M545 Low back pain, unspecified: Secondary | ICD-10-CM | POA: Insufficient documentation

## 2024-05-25 DIAGNOSIS — M797 Fibromyalgia: Secondary | ICD-10-CM | POA: Insufficient documentation

## 2024-05-25 DIAGNOSIS — E669 Obesity, unspecified: Secondary | ICD-10-CM | POA: Diagnosis not present

## 2024-05-25 DIAGNOSIS — G8929 Other chronic pain: Secondary | ICD-10-CM | POA: Insufficient documentation

## 2024-05-25 MED ORDER — PREGABALIN 75 MG PO CAPS
75.0000 mg | ORAL_CAPSULE | Freq: Three times a day (TID) | ORAL | 3 refills | Status: AC
  Filled 2024-05-25: qty 90, 30d supply, fill #0
  Filled 2024-07-17: qty 90, 30d supply, fill #1
  Filled 2024-08-28: qty 90, 30d supply, fill #2

## 2024-05-25 NOTE — Progress Notes (Signed)
 Subjective:    Patient ID: Karen Burton, female    DOB: [birth year], 48 y.o.   MRN: [id number]  HPI   HPI   Karen Burton is a 48 y.o. year old female  who  has a past medical history of Anemia, Dysmenorrhea (12/20/2015), Eczema, Fibroids (48/23/2017), Fibromyalgia, GERD (gastroesophageal reflux disease), Hypertension, Menorrhagia with regular cycle (12/20/2015), PONV (postoperative nausea and vomiting), Rash, Reflux, Rheumatoid arthritis (HCC), Rhinitis, Vitamin D deficiency, and Vulvar itching (12/20/2015).   They are presenting to PM&R clinic as a new patient for pain management evaluation. They were referred by Dr. Bluford for treatment of fibromyalgia pain.  She has a history of RA and reports this was diagnosed 17 years ago.  Initially her primary location of pain was her lower back.  She later developed pain in her hips and buttocks bilaterally.  Pain in her hips is throbbing in quality.  Over time she feels as the pain has spread to other locations including her arms and legs.  Her neck and shoulders are often very painful.  She feels like her entire body is stressed and this is contributing to her pain.  She denies shooting pain down her arms or legs.   Patient reports he is generally sensitive to medications and they will often make her sleepy.   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  She also helps to take care of her mother.  After doing a lot of activity she often has to lay in bed for a few days to recover.   She reports poor sleep, sometimes takes Ambien  to help this.   Reports frequent constipation with occasional diarrhea.  Reports history of IBS   Decreased mood at times.  Denies SI or HI.  She reports she has a lot of stress because she feels like the pain could prevent her from being able to care for her mother and could limit her from working.     Red flag symptoms: No red flags for back pain endorsed in Hx or ROS   Medications  tried: Topical medications- Aleve  roll on  Nsaids Ibuprofen- limited by liver enzyme elevation, meloxicam - didn't help much  Tylenol   Helps a little  Opiates  Tramadol - didn't help , used this for short time, helped her sleep  Gabapentin- tried it one time  TCAs  denies  SNRIs  Cymbalta - has been on it for a while Robaxin - didn't help    Other treatments: PT - March 2024, restarted Aquatic therapy TENs unit  has not tried it before  Injections- SI joint injections- didn't help , Trigger point injections- didn't help  Dry needling didn't help   Surgery Denies joint/back surgeries      Prior UDS results:  Labs (Brief)  No results found for: LABOPIA, COCAINSCRNUR, LABBENZ, AMPHETMU, THCU, LABBARB        Interval History 06/13/23   Karen Burton is here for follow-up regarding her chronic pain.  Patient reports that her body wide pain is much improved with aquatic therapy.  Her worst pain continues to be her lower back average about 3 out of 10.  Currently pain is 0 out of 10.  She reports too much sedation with Lyrica  75 mg twice daily but says she did not have this issue with 50 mg dose.     Interval History 09/13/23 Patient reports her pain has been worse recently.  She feels like pool exercises were helping, until there was an  issue with the hot water heater at the pool she is going to.  She plans to restart this soon.  She is tired tolerating Lyrica  50 mg daily, higher doses caused sedation.   Interval History 02/21/2024 Patient is here for follow-up of chronic pain.  She continues to have pain in multiple areas of her body.  Recently pain has been dull and achy and has not the worst in her lower back, upper buttocks.  She is using massage device that does help calm these areas down.  She has not been doing pool exercises recently, she had to stop this after she had a left ankle injury 11/30/2023.  She has full with [facility].  Ortho note indicates she had a left hind foot  contusions, anterior process calcaneus fracture and peroneal tendinopathy.  Initially was wearing a boot on her left leg.  Wearing this caused a lot of pain in her lower back.  More recently she was upgraded to a ASO brace.  She noticed improvement in her overall pain when she started taking Lyrica .  She feels like her body has gotten used to it.   She is also taking low-dose naltrexone, she is not sure if this has had much benefit to her pain yet.    Interval History 05/25/24 Discussed the use of AI scribe software for clinical note transcription with the patient, who gave verbal consent to proceed.   Reports ongoing, widespread body pain. Pain is present everywhere except the jaw. Specific areas of pain include shoulders, elbows, wrists, hands, fingers, toes, side of the leg, lower back, and hips. Describes waking with numbness in hands and experiencing numbness in fingers and toes. Pain in the left hip is described as a sharp pain with certain movements (bending, twisting), which can lead to an inability to walk or lift the leg. This pain is tender over the sciatic area and radiates into the buttocks and lower back. The low back is reported as the most painful area. Pain is exacerbated by bending over, such as when assisting her mother or at work. By the end of the day, pain is severe. Unable to carry objects away from the body due to back pain. Describes a sensation of the body going to fall in half without sitting down when the pain is severe. Sitting down provides some relief but causes a radiating pain up to the mid-back. Lying flat on the back is difficult, and turning in bed is painful, especially turning over the left hip. Feels as though muscles are constantly sore, similar to post-workout soreness, despite a lack of exercise. Has a whooshing sound in the ear and nausea with bending over, which has prevented participation in yoga.  Medication Review - Naltrexone: Unsure about benefit from  the current dose. Will continue at the current dose and reassess. - Lyrica  (pregabalin ): Currently taking 50 mg three times daily. Reports good tolerance and is sometimes taking two 50 mg capsules at once in the morning without increased somnolence. - Tramadol : (ordered by different provider) Reports it causes significant somnolence and is not very effective for pain. Takes half of a 50 mg tablet (25 mg) and still experiences significant sedation. - Meloxicam : Takes in the morning for work. - Tylenol : Takes in the morning for work.  Pain Inventory Average Pain 8 Pain Right Now 7 My pain is constant, sharp, dull, stabbing, tingling, and aching  In the last 24 hours, has pain interfered with the following? General activity 9 Relation with others 10  Enjoyment of life 10 What TIME of day is your pain at its worst? morning  and night Sleep (in general) Poor  Pain is worse with: walking, bending, standing, and some activites Pain improves with: medication Relief from Meds: 2  Family History  Problem Relation Age of Onset   Breast cancer Mother 37   Diabetes Mother    Hypertension Mother    Asthma Mother    Cancer Mother        breast   Thyroid  disease Mother    Arthritis Mother    Yvone' disease Mother    Fibroids Sister    Cancer Maternal Grandmother        pancreatic   COPD Maternal Aunt    Asthma Maternal Aunt    Gout Maternal Aunt    Social History   Socioeconomic History   Marital status: Single    Spouse name: Not on file   Number of children: 0   Years of education: Not on file   Highest education level: Associate degree: occupational, scientist, product/process development, or vocational program  Occupational History   Not on file  Tobacco Use   Smoking status: Never    Passive exposure: Never   Smokeless tobacco: Never  Vaping Use   Vaping status: Never Used  Substance and Sexual Activity   Alcohol use: No   Drug use: Never   Sexual activity: Not Currently    Birth  control/protection: None, Abstinence  Other Topics Concern   Not on file  Social History Narrative   Not on file   Social Drivers of Health   Financial Resource Strain: Low Risk  (04/01/2024)   Overall Financial Resource Strain (CARDIA)    Difficulty of Paying Living Expenses: Not hard at all  Food Insecurity: No Food Insecurity (04/01/2024)   Hunger Vital Sign    Worried About Running Out of Food in the Last Year: Never true    Ran Out of Food in the Last Year: Never true  Transportation Needs: No Transportation Needs (04/01/2024)   PRAPARE - Administrator, Civil Service (Medical): No    Lack of Transportation (Non-Medical): No  Physical Activity: Insufficiently Active (04/01/2024)   Exercise Vital Sign    Days of Exercise per Week: 2 days    Minutes of Exercise per Session: 20 min  Stress: No Stress Concern Present (04/01/2024)   [employer] of Occupational Health - Occupational Stress Questionnaire    Feeling of Stress: Not at all  Recent Concern: Stress - Stress Concern Present (03/26/2024)   [employer] of Occupational Health - Occupational Stress Questionnaire    Feeling of Stress: Very much  Social Connections: Socially Isolated (04/01/2024)   Social Connection and Isolation Panel    Frequency of Communication with Friends and Family: More than three times a week    Frequency of Social Gatherings with Friends and Family: Three times a week    Attends Religious Services: Never    Active Member of Clubs or Organizations: No    Attends Banker Meetings: Not on file    Marital Status: Never married   Past Surgical History:  Procedure Laterality Date   CHOLECYSTECTOMY N/A 02/08/2022   Procedure: LAPAROSCOPIC CHOLECYSTECTOMY;  Surgeon: Evonnie Dorothyann LABOR, DO;  Location: [facility];  Service: General;  Laterality: N/A;   ESOPHAGOGASTRODUODENOSCOPY ENDOSCOPY     IR GENERIC HISTORICAL  02/07/2016   IR RADIOLOGIST EVAL & MGMT 02/07/2016 Norleen Roulette, MD  [facility]   WISDOM TOOTH  EXTRACTION     Past Surgical History:  Procedure Laterality Date   CHOLECYSTECTOMY N/A 02/08/2022   Procedure: LAPAROSCOPIC CHOLECYSTECTOMY;  Surgeon: Evonnie Dorothyann LABOR, DO;  Location: [facility];  Service: General;  Laterality: N/A;   ESOPHAGOGASTRODUODENOSCOPY ENDOSCOPY     IR GENERIC HISTORICAL  02/07/2016   IR RADIOLOGIST EVAL & MGMT 02/07/2016 Norleen Roulette, MD [facility]   WISDOM TOOTH EXTRACTION     Past Medical History:  Diagnosis Date   Anemia    Dysmenorrhea 12/20/2015   Eczema    mild   Fibroids 12/20/2015   Fibromyalgia    GERD (gastroesophageal reflux disease)    Hypertension    Menorrhagia with regular cycle 12/20/2015   PONV (postoperative nausea and vomiting)    Rash    Reflux    Rheumatoid arthritis (HCC)    Rhinitis    chonic   Vitamin D deficiency    Vulvar itching 12/20/2015   BP (!) 136/93   Pulse 80   Ht 5' 5 (1.651 m)   Wt 230 lb (104.3 kg)   SpO2 98%   BMI 38.27 kg/m   Opioid Risk Score:   Fall Risk Score:  `1  Depression screen [facility] 2/9     04/02/2024   10:38 AM 03/26/2024   10:42 AM 08/29/2023    9:07 AM 06/13/2023   10:02 AM 05/03/2023    1:11 PM 03/28/2023    9:39 AM 09/26/2022    9:39 AM  Depression screen PHQ 2/9  Decreased Interest 3 2 3  0 3 1 1   Down, Depressed, Hopeless 1 1 3  0 2 1 1   PHQ - 2 Score 4 3 6  0 5 2 2   Altered sleeping 0 1 3  2 2 1   Tired, decreased energy 3 3 3  3 2  0  Change in appetite 0 0 1  0 0 0  Feeling bad or failure about yourself  0 0 1  1 0 1  Trouble concentrating 0 1 0  0 0 0  Moving slowly or fidgety/restless 0 0 0  0 0 0  Suicidal thoughts 0 0 0  0 0 0  PHQ-9 Score 7 8 14  11 6 4   Difficult doing work/chores   Very difficult  Somewhat difficult Somewhat difficult Somewhat difficult     Review of Systems  Musculoskeletal:  Positive for back pain and neck pain.       B/L shoulder, knee. Hip pain Left ankle pain  All other systems reviewed and are  negative.      Objective:   Physical Exam   Gen: no distress, normal appearing HEENT: oral mucosa pink and moist, NCAT Chest: normal effort, normal rate of breathing Abd: soft, non-distended Ext: no edema Psych: pleasant, appropriate Skin: intact Neuro: Awake, follows commands, cranial nerves II through XII grossly intact, normal speech and language Strength moving all 4 extremities to gravity and resistance Sensory exam normal for light touch and pain in all 4 limbs. No limb ataxia or cerebellar signs. No abnormal tone appreciated.   Musculoskeletal:  Wearing ankle brace left ankle TTP in bilateral arms and legs throughout Physical examination reveals tenderness to palpation over the bilateral paraspinal muscles in the thoracic and lumbar regions. Tenderness noted over the trapezius muscles bilaterally. Pain is elicited with axial loading on the shoulders, referred to the mid-back. Internal rotation of the left hip elicits pain.  Patient indicates very tender areas in her upper glutes   MRI L-spine 2-3 24  IMPRESSION: 1. Lumbar spondylosis and degenerative disc disease, causing mild displacement of the right L2 nerve in the lateral extraforaminal space at L2-3 and borderline right foraminal stenosis at L5-S1. 2. Large heterogeneous presacral structure believed to represent fibroid uterus, only partially included on today's exam.         Assessment & Plan:   1) Fibromyalgia             -Diagnosis of fibromyalgia being consistent with her body wide pain, mood disorder, sleep disorder, IBS history 2) Rheumatoid arthritis             -Appears to be well-controlled continue follow-up with rheumatology 3) Mood disorder             -She declines to discuss this in further detail, denies SI or HI 4) Chronic right lower back pain without sciatica.  Lumbar spondylosis noted on recent MRI 5)Obesity Body mass index is 36.28 kg/m.     -Continue duloxetine  60 mg daily -Continue as  needed Tylenol  -Patient improvement with aquatic therapy.  Think restarting for self aquatic exercises would be beneficial when ankle injury is improved. -Zynex nexwave ordered prior visit -Increase Lyrica  (pregabalin ) back up to 75 mg capsules. Instructed to start with one capsule twice daily and may increase to three times daily as tolerated for improved pain control. Counseled to contact the office if side effects are not tolerable.  -Discussed gradual progression of low impact exercise -Patient appears to have a lot of pain with spinal extension facet loading, MRI L-spine with evidence of L2-3, L3-4 facet arthropathy.  Referral for medial branch block these levals  Discussed trying tai chi with patient -Discussed trying TENS, Zynex expensive however she could try OTC item -Continue low-dose naltrexone 4 mg daily.  -Re-referral to [facility] for formal aquatic physical therapy, as this was beneficial previously. Discussed the option of finding a facility with a heated pool, such as [facility], as cold water at the [facility] was a barrier to exercise. -Continue to encourage weight loss

## 2024-05-26 ENCOUNTER — Other Ambulatory Visit: Payer: Self-pay

## 2024-06-09 ENCOUNTER — Other Ambulatory Visit (HOSPITAL_COMMUNITY): Payer: Self-pay

## 2024-06-09 ENCOUNTER — Encounter (INDEPENDENT_AMBULATORY_CARE_PROVIDER_SITE_OTHER): Payer: Self-pay

## 2024-06-09 ENCOUNTER — Telehealth: Payer: Self-pay | Admitting: Physical Medicine & Rehabilitation

## 2024-06-09 ENCOUNTER — Other Ambulatory Visit: Payer: Self-pay

## 2024-06-09 DIAGNOSIS — M797 Fibromyalgia: Secondary | ICD-10-CM

## 2024-06-09 NOTE — Progress Notes (Signed)
 Specialty Pharmacy Refill Coordination Note  MyChart Questionnaire Submission  Karen Burton is a 48 y.o. female contacted today regarding refills of specialty medication(s) Enbrel .  Doses on hand: (Patient-Rptd) 0   Injection date: (Patient-Rptd) 06/15/24  Patient requested: (Patient-Rptd) Delivery   Delivery date: 06/11/24  Verified address: [address] [place] [address]  Medication will be filled on 06/10/24.

## 2024-06-09 NOTE — Telephone Encounter (Signed)
 This pt called and stated referral that the place the physical therapy referral was sent to doesn't do water therapy. She is wanting it to be resent to [facility] bc they do water therapy

## 2024-06-10 ENCOUNTER — Other Ambulatory Visit: Payer: Self-pay

## 2024-06-13 ENCOUNTER — Other Ambulatory Visit (HOSPITAL_COMMUNITY): Payer: Self-pay

## 2024-07-03 ENCOUNTER — Other Ambulatory Visit (HOSPITAL_COMMUNITY): Payer: Self-pay

## 2024-07-03 ENCOUNTER — Telehealth: Payer: Self-pay

## 2024-07-03 ENCOUNTER — Other Ambulatory Visit: Payer: Self-pay

## 2024-07-03 NOTE — Telephone Encounter (Signed)
 Received notification from [facility] pharmacy that patient requires a new authorization for their medication.  Submitted an URGENT Prior Authorization request to [employer] for ENBREL  via CoverMyMeds. Will update once we receive a response.  Key: [id number]

## 2024-07-06 ENCOUNTER — Other Ambulatory Visit (HOSPITAL_COMMUNITY): Payer: Self-pay

## 2024-07-06 ENCOUNTER — Other Ambulatory Visit: Payer: Self-pay

## 2024-07-06 NOTE — Telephone Encounter (Signed)
 Received notification from [facility] regarding a prior authorization for ENBREL . Authorization has been APPROVED from 07/03/2024 to 07/02/2025. Approval letter sent to scan center.  Authorization # [id number]

## 2024-07-07 ENCOUNTER — Other Ambulatory Visit (HOSPITAL_COMMUNITY): Payer: Self-pay

## 2024-07-09 ENCOUNTER — Other Ambulatory Visit: Payer: Self-pay

## 2024-07-13 ENCOUNTER — Other Ambulatory Visit: Payer: Self-pay

## 2024-07-17 ENCOUNTER — Other Ambulatory Visit: Payer: Self-pay | Admitting: Pharmacy Technician

## 2024-07-17 NOTE — Progress Notes (Signed)
 Specialty Pharmacy Refill Coordination Note  Karen Burton is a 48 y.o. female contacted today regarding refills of specialty medication(s) Etanercept  (Enbrel  Mini)   Patient requested Delivery   Delivery date: 07/22/24   Verified address: [address]  [place]   Medication will be filled on: 07/21/24

## 2024-07-20 ENCOUNTER — Other Ambulatory Visit: Payer: Self-pay | Admitting: Physician Assistant

## 2024-07-20 ENCOUNTER — Other Ambulatory Visit (HOSPITAL_COMMUNITY): Payer: Self-pay

## 2024-07-20 ENCOUNTER — Other Ambulatory Visit: Payer: Self-pay | Admitting: *Deleted

## 2024-07-20 DIAGNOSIS — Z111 Encounter for screening for respiratory tuberculosis: Secondary | ICD-10-CM

## 2024-07-20 DIAGNOSIS — Z79899 Other long term (current) drug therapy: Secondary | ICD-10-CM

## 2024-07-20 DIAGNOSIS — R748 Abnormal levels of other serum enzymes: Secondary | ICD-10-CM

## 2024-07-20 DIAGNOSIS — Z9225 Personal history of immunosupression therapy: Secondary | ICD-10-CM

## 2024-07-20 MED ORDER — DULOXETINE HCL 60 MG PO CPEP
60.0000 mg | ORAL_CAPSULE | Freq: Every day | ORAL | 2 refills | Status: AC
  Filled 2024-07-20 – 2024-08-06 (×4): qty 30, 30d supply, fill #0
  Filled 2024-09-03: qty 30, 30d supply, fill #1

## 2024-07-20 NOTE — Telephone Encounter (Signed)
 Last Fill: 05/05/2024  Next Visit: 09/24/2023  Last Visit: 04/21/2024  Dx: Fibromyalgia   Current Dose per office note on 04/21/2024: She is taking Lyrica  and Cymbalta  as prescribed.   Okay to refill Cymbalta ?

## 2024-07-21 ENCOUNTER — Other Ambulatory Visit (HOSPITAL_COMMUNITY): Payer: Self-pay

## 2024-07-21 DIAGNOSIS — Z9225 Personal history of immunosupression therapy: Secondary | ICD-10-CM | POA: Diagnosis not present

## 2024-07-21 DIAGNOSIS — Z111 Encounter for screening for respiratory tuberculosis: Secondary | ICD-10-CM | POA: Diagnosis not present

## 2024-07-21 DIAGNOSIS — Z79899 Other long term (current) drug therapy: Secondary | ICD-10-CM | POA: Diagnosis not present

## 2024-07-21 DIAGNOSIS — R748 Abnormal levels of other serum enzymes: Secondary | ICD-10-CM | POA: Diagnosis not present

## 2024-07-23 ENCOUNTER — Ambulatory Visit: Payer: Self-pay | Admitting: Physician Assistant

## 2024-07-23 NOTE — Progress Notes (Signed)
 Creatinine is slightly elevated-1.10 and GFR is 62-rest of CMP WNL.  Avoid NSAID use and increase water intake.  CBC WNL CK WNL TB gold pending.

## 2024-07-24 ENCOUNTER — Other Ambulatory Visit (HOSPITAL_COMMUNITY): Payer: Self-pay

## 2024-07-24 ENCOUNTER — Other Ambulatory Visit: Payer: Self-pay | Admitting: Family Medicine

## 2024-07-26 LAB — CMP14+EGFR
ALT: 30 IU/L (ref 0–32)
AST: 33 IU/L (ref 0–40)
Albumin: 4.1 g/dL (ref 3.9–4.9)
Alkaline Phosphatase: 104 IU/L (ref 41–116)
BUN/Creatinine Ratio: 12 (ref 9–23)
BUN: 13 mg/dL (ref 6–24)
Bilirubin Total: 0.3 mg/dL (ref 0.0–1.2)
CO2: 24 mmol/L (ref 20–29)
Calcium: 9.8 mg/dL (ref 8.7–10.2)
Chloride: 101 mmol/L (ref 96–106)
Creatinine, Ser: 1.1 mg/dL — ABNORMAL HIGH (ref 0.57–1.00)
Globulin, Total: 3.2 g/dL (ref 1.5–4.5)
Glucose: 103 mg/dL — ABNORMAL HIGH (ref 70–99)
Potassium: 4 mmol/L (ref 3.5–5.2)
Sodium: 140 mmol/L (ref 134–144)
Total Protein: 7.3 g/dL (ref 6.0–8.5)
eGFR: 62 mL/min/1.73

## 2024-07-26 LAB — CBC WITH DIFFERENTIAL/PLATELET
Basophils Absolute: 0 x10E3/uL (ref 0.0–0.2)
Basos: 1 %
EOS (ABSOLUTE): 0.1 x10E3/uL (ref 0.0–0.4)
Eos: 2 %
Hematocrit: 38 % (ref 34.0–46.6)
Hemoglobin: 11.9 g/dL (ref 11.1–15.9)
Immature Grans (Abs): 0 x10E3/uL (ref 0.0–0.1)
Immature Granulocytes: 0 %
Lymphocytes Absolute: 1.6 x10E3/uL (ref 0.7–3.1)
Lymphs: 41 %
MCH: 27.2 pg (ref 26.6–33.0)
MCHC: 31.3 g/dL — ABNORMAL LOW (ref 31.5–35.7)
MCV: 87 fL (ref 79–97)
Monocytes Absolute: 0.4 x10E3/uL (ref 0.1–0.9)
Monocytes: 11 %
Neutrophils Absolute: 1.8 x10E3/uL (ref 1.4–7.0)
Neutrophils: 45 %
Platelets: 264 x10E3/uL (ref 150–450)
RBC: 4.37 x10E6/uL (ref 3.77–5.28)
RDW: 13.8 % (ref 11.7–15.4)
WBC: 4 x10E3/uL (ref 3.4–10.8)

## 2024-07-26 LAB — QUANTIFERON-TB GOLD PLUS
QuantiFERON Mitogen Value: 5.99 [IU]/mL
QuantiFERON Nil Value: 0.04 [IU]/mL
QuantiFERON TB1 Ag Value: 0.05 [IU]/mL
QuantiFERON TB2 Ag Value: 0.05 [IU]/mL
QuantiFERON-TB Gold Plus: NEGATIVE

## 2024-07-26 LAB — CK: Total CK: 121 U/L (ref 32–182)

## 2024-07-26 NOTE — Progress Notes (Signed)
 TB gold negative

## 2024-07-27 ENCOUNTER — Telehealth: Payer: Self-pay | Admitting: Rheumatology

## 2024-07-27 MED ORDER — ZOLPIDEM TARTRATE 5 MG PO TABS
5.0000 mg | ORAL_TABLET | Freq: Every evening | ORAL | 0 refills | Status: AC | PRN
  Filled 2024-07-27: qty 30, 30d supply, fill #0

## 2024-07-27 MED ORDER — TRAMADOL HCL 50 MG PO TABS
50.0000 mg | ORAL_TABLET | Freq: Three times a day (TID) | ORAL | 0 refills | Status: AC | PRN
  Filled 2024-07-27: qty 15, 5d supply, fill #0

## 2024-07-27 NOTE — Telephone Encounter (Signed)
 Patient left a voicemail (07/24/24 at 2:50 pm) stating she was returning a call regarding her labwork results.

## 2024-07-27 NOTE — Telephone Encounter (Signed)
 Attempted to contact patient and left message to advise patient to call the office. See lab note for details.

## 2024-07-28 ENCOUNTER — Encounter: Attending: Physical Medicine & Rehabilitation | Admitting: Physical Medicine & Rehabilitation

## 2024-07-28 ENCOUNTER — Other Ambulatory Visit: Payer: Self-pay

## 2024-07-28 ENCOUNTER — Encounter: Payer: Self-pay | Admitting: Physical Medicine & Rehabilitation

## 2024-07-28 ENCOUNTER — Other Ambulatory Visit (HOSPITAL_COMMUNITY): Payer: Self-pay

## 2024-07-28 VITALS — BP 151/96 | HR 90 | Ht 65.0 in | Wt 224.2 lb

## 2024-07-28 DIAGNOSIS — M47817 Spondylosis without myelopathy or radiculopathy, lumbosacral region: Secondary | ICD-10-CM | POA: Insufficient documentation

## 2024-07-28 MED ORDER — IOHEXOL 180 MG/ML  SOLN
3.0000 mL | Freq: Once | INTRAMUSCULAR | Status: AC
  Administered 2024-07-28: 3 mL

## 2024-07-28 MED ORDER — LIDOCAINE HCL 1 % IJ SOLN
10.0000 mL | Freq: Once | INTRAMUSCULAR | Status: AC
  Administered 2024-07-28: 10 mL

## 2024-07-28 MED ORDER — LIDOCAINE HCL (PF) 2 % IJ SOLN
5.0000 mL | Freq: Once | INTRAMUSCULAR | Status: AC
  Administered 2024-07-28: 5 mL

## 2024-07-28 NOTE — Progress Notes (Signed)
 Bilateral Lumbar L2, L3, L4  medial branch blocks under fluoroscopic guidance  Indication: Lumbar pain which is not relieved by medication management or other conservative care and interfering with self-care and mobility.  Informed consent was obtained after describing risks and benefits of the procedure with the patient, this includes bleeding, infection, paralysis and medication side effects.  The patient wishes to proceed and has given written consent.  The patient was placed in prone position.  The lumbar area was marked and prepped with Betadine.  One mL of 1% lidocaine  was injected into each of 6 areas into the skin and subcutaneous tissue.  Then a 22-gauge 5in spinal needle was inserted targeting the junction of the left L5 superior articular process and transverse process junction was targeted.  Bone contact was made. Omnipaque  180 was injected x 0.5 mL demonstrating no intravascular uptake. Then a solution containing  2% MPF lidocaine  was injected x 0.5 mL.  Then the left L4 superior articular process in transverse process junction was targeted.  Bone contact was made. Omnipaque  180 was injected x 0.5 mL demonstrating no intravascular uptake.  Then a solution containing2% MPF lidocaine  was injected x 0.5 mL.  Then the left L3 superior articular process in transverse process junction was targeted.  Bone contact was made. Omnipaque  180 was injected x 0.5 mL demonstrating no intravascular uptake.  Then a solution containing2% MPF lidocaine  was injected x 0.5 mL. This same procedure was performed on the right side using the same needle, technique and injectate.  Patient tolerated procedure well.  Post procedure instructions were given.   Lidocaine  1% with preservative multidose, 10ml no waste Lidocaine  2% MPF, 5ml bottle, 3ml used 2ml waste Omnipaque  180 1.5 ml used, 8.5 ml waste

## 2024-07-28 NOTE — Progress Notes (Signed)
" °  PROCEDURE RECORD [facility]   Name: Karen Burton DOB:May 05, 1976 MRN: [id number]  Date:07/28/2024  Physician: Prentice Compton, MD    Nurse/CMA: Deirdre  Allergies: Allergies[1]  Consent Signed: Yes.    Is patient diabetic? No.  CBG today?   Pregnant: No. LMP: Patient's last menstrual period was 07/21/2024 (exact date). (age [age])  Anticoagulants: no Anti-inflammatory: no Antibiotics: no  Procedure: bilateral lumbar 2-3, 3-4 medial branch block  Position: Prone Start Time: 11:03  End Time: 11:20  Fluoro Time: 1:45  RN/CMA ShumakerRN ShumakerRN    Time 10:37 11:29    BP 151/96 146/92    Pulse 90 71    Respirations 16 16    O2 Sat 99 98    S/S 6 6    Pain Level 5/10 4/10     D/C home with self, patient A & O X 3, D/C instructions reviewed, and sits independently.           [1]  Allergies Allergen Reactions   Ceftin [Cefuroxime Axetil] Rash    Itchy Rash on neck, no hives   Humira  [Adalimumab ] Other (See Comments)    Burning at injection site   Methotrexate And Trimetrexate Other (See Comments)    Leukopenia   "

## 2024-07-28 NOTE — Patient Instructions (Addendum)
 Lumbar medial branch blocks were performed. This is to help diagnose the cause of the low back pain. It is important that you keep track of your pain for the first day or 2 after injection. This injection can give you temporary relief that lasts for hours or up to several months. There is no way to predict duration of pain relief.  Please try to compare your pain after injection to for the injection.  If this injection gives you  temporary relief there may be another longer-lasting procedure that may be beneficial call radiofrequency ablation   Please call us  in 2 hrs and let us  know your pain relief

## 2024-07-29 ENCOUNTER — Other Ambulatory Visit: Payer: Self-pay

## 2024-08-03 ENCOUNTER — Other Ambulatory Visit (HOSPITAL_COMMUNITY): Payer: Self-pay

## 2024-08-05 ENCOUNTER — Ambulatory Visit (HOSPITAL_BASED_OUTPATIENT_CLINIC_OR_DEPARTMENT_OTHER): Admitting: Physical Therapy

## 2024-08-06 ENCOUNTER — Other Ambulatory Visit (HOSPITAL_COMMUNITY): Payer: Self-pay

## 2024-08-06 ENCOUNTER — Other Ambulatory Visit: Payer: Self-pay

## 2024-08-11 ENCOUNTER — Other Ambulatory Visit: Payer: Self-pay

## 2024-08-11 ENCOUNTER — Other Ambulatory Visit: Payer: Self-pay | Admitting: Physician Assistant

## 2024-08-11 DIAGNOSIS — M0579 Rheumatoid arthritis with rheumatoid factor of multiple sites without organ or systems involvement: Secondary | ICD-10-CM

## 2024-08-11 MED ORDER — ENBREL MINI 50 MG/ML ~~LOC~~ SOCT
50.0000 mg | SUBCUTANEOUS | 2 refills | Status: AC
  Filled 2024-08-11 – 2024-08-17 (×4): qty 4, 28d supply, fill #0

## 2024-08-11 NOTE — Telephone Encounter (Signed)
 Last Fill: 05/06/2024  Labs: 07/21/2024 Creatinine is slightly elevated-1.10 and GFR is 62-rest of CMP WNL.  Avoid NSAID use and increase water intake.  CBC WNL CK WNL.   TB Gold: 07/21/2024 TB gold negative   Next Visit: 09/24/2023  Last Visit: 04/21/2024  DX: Rheumatoid arthritis involving multiple sites with positive rheumatoid factor   Current Dose per office note 04/21/2024: Enbrel  50 mg sq injection once weekly.   Okay to refill Enbrel ?

## 2024-08-12 ENCOUNTER — Other Ambulatory Visit: Payer: Self-pay

## 2024-08-12 ENCOUNTER — Ambulatory Visit (HOSPITAL_BASED_OUTPATIENT_CLINIC_OR_DEPARTMENT_OTHER): Admitting: Physical Therapy

## 2024-08-13 ENCOUNTER — Other Ambulatory Visit: Payer: Self-pay

## 2024-08-14 ENCOUNTER — Other Ambulatory Visit: Payer: Self-pay

## 2024-08-17 ENCOUNTER — Other Ambulatory Visit: Payer: Self-pay

## 2024-08-17 ENCOUNTER — Other Ambulatory Visit (HOSPITAL_COMMUNITY): Payer: Self-pay

## 2024-08-17 ENCOUNTER — Telehealth: Payer: Self-pay

## 2024-08-17 NOTE — Telephone Encounter (Signed)
 Received notice from [facility] that pt has changed insurances and requires a new PA.  Submitted a Prior Authorization request to [employer] for ENBREL  via CoverMyMeds. Authorization has been APPROVED from 08/17/2024 to 08/17/2025. Approval letter sent to scan center.   CMM KEY: [id number] Authorization#: [id number]

## 2024-08-17 NOTE — Progress Notes (Signed)
 Specialty Pharmacy Refill Coordination Note  Karen Burton is a 49 y.o. female contacted today regarding refills of specialty medication(s) Etanercept  (Enbrel  Mini)   Patient requested (Patient-Rptd) Delivery   Delivery date: 08/26/24   Verified address: (Patient-Rptd) [address] [place] [address]   Medication will be filled on: 08/25/24

## 2024-08-19 ENCOUNTER — Other Ambulatory Visit: Payer: Self-pay

## 2024-08-19 ENCOUNTER — Ambulatory Visit (HOSPITAL_BASED_OUTPATIENT_CLINIC_OR_DEPARTMENT_OTHER): Admitting: Physical Therapy

## 2024-08-21 ENCOUNTER — Other Ambulatory Visit: Payer: Self-pay

## 2024-08-21 NOTE — Progress Notes (Signed)
 Patient updated delivery date 1.30.26, patient confirmed we can change delivery date due to upcoming weather.

## 2024-08-26 ENCOUNTER — Ambulatory Visit (HOSPITAL_BASED_OUTPATIENT_CLINIC_OR_DEPARTMENT_OTHER): Admitting: Physical Therapy

## 2024-08-26 ENCOUNTER — Other Ambulatory Visit: Payer: Self-pay

## 2024-08-27 ENCOUNTER — Other Ambulatory Visit: Payer: Self-pay

## 2024-08-28 ENCOUNTER — Other Ambulatory Visit: Payer: Self-pay

## 2024-08-31 ENCOUNTER — Other Ambulatory Visit: Payer: Self-pay

## 2024-09-03 ENCOUNTER — Other Ambulatory Visit: Payer: Self-pay

## 2024-09-03 ENCOUNTER — Other Ambulatory Visit (HOSPITAL_COMMUNITY): Payer: Self-pay

## 2024-09-03 NOTE — Progress Notes (Signed)
 Specialty Pharmacy Ongoing Clinical Assessment Note  Karen Burton is a 49 y.o. female who is being followed by the specialty pharmacy service for RxSp Rheumatoid Arthritis   Patient's specialty medication(s) reviewed today: Etanercept  (Enbrel  Mini)   Missed doses in the last 4 weeks: 0   Patient/Caregiver did not have any additional questions or concerns.   Therapeutic benefit summary: Patient is achieving benefit   Adverse events/side effects summary: Experienced adverse events/side effects (pain with injection, tolerable at this time)   Patient's therapy is appropriate to: Continue    Goals Addressed             This Visit's Progress    Minimize recurrence of flares   On track    Patient is on track. Patient will maintain adherence.  Patient reports she is well-controlled with no recent flares.          Follow up: 12 months  Silvano LOISE Dolly Specialty Pharmacist

## 2024-09-04 NOTE — Progress Notes (Unsigned)
 "  Office Visit Note  Patient: Natassja Ollis             Date of Birth: Jun 23, 1976           MRN: [id number]             PCP: Cook, Jayce G, DO Referring: Cook, Jayce G, DO Visit Date: 09/10/2024 Occupation: Data Unavailable  Subjective:  No chief complaint on file.   History of Present Illness: Betheny Suchecki is a 49 y.o. female ***     Activities of Daily Living:  Patient reports morning stiffness for *** {minute/hour:19697}.   Patient {ACTIONS;DENIES/REPORTS:21021675::Denies} nocturnal pain.  Difficulty dressing/grooming: {ACTIONS;DENIES/REPORTS:21021675::Denies} Difficulty climbing stairs: {ACTIONS;DENIES/REPORTS:21021675::Denies} Difficulty getting out of chair: {ACTIONS;DENIES/REPORTS:21021675::Denies} Difficulty using hands for taps, buttons, cutlery, and/or writing: {ACTIONS;DENIES/REPORTS:21021675::Denies}  No Rheumatology ROS completed.   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   Obesity (BMI 30-39.9) 03/28/2023   Healthcare maintenance 09/26/2022   Insomnia 09/06/2021   Fibromyalgia 11/06/2016   Uterine leiomyoma 12/20/2015   Essential hypertension 01/12/2013   Rheumatoid arthritis (HCC) 01/12/2013    Past Medical History:  Diagnosis Date   Anemia    Dysmenorrhea 12/20/2015   Eczema    mild   Fibroids 12/20/2015   Fibromyalgia    GERD (gastroesophageal reflux disease)    Hypertension    Menorrhagia with regular cycle 12/20/2015   PONV (postoperative nausea and vomiting)    Rash    Reflux    Rheumatoid arthritis (HCC)    Rhinitis    chonic   Vitamin D deficiency    Vulvar itching 12/20/2015    Family History  Problem Relation Age of Onset   Breast cancer Mother 30   Diabetes Mother    Hypertension Mother    Asthma Mother     Cancer Mother        breast   Thyroid  disease Mother    Arthritis Mother    Yvone' disease Mother    Fibroids Sister    Cancer Maternal Grandmother        pancreatic   COPD Maternal Aunt    Asthma Maternal Aunt    Gout Maternal Aunt    Past Surgical History:  Procedure Laterality Date   CHOLECYSTECTOMY N/A 02/08/2022   Procedure: LAPAROSCOPIC CHOLECYSTECTOMY;  Surgeon: Evonnie Dorothyann LABOR, DO;  Location: [facility];  Service: General;  Laterality: N/A;   ESOPHAGOGASTRODUODENOSCOPY ENDOSCOPY     IR GENERIC HISTORICAL  02/07/2016   IR RADIOLOGIST EVAL & MGMT 02/07/2016 Norleen Roulette, MD [facility] INTERV RAD   WISDOM TOOTH EXTRACTION     Social History[1] Social History   Social History Narrative   Not on file     Immunization History  Administered Date(s) Administered   DT (Pediatric) 02/20/2013   Influenza-Unspecified 07/18/2009, 05/16/2016, 06/03/2017, 04/07/2018, 04/27/2021   PFIZER(Purple Top)SARS-COV-2 Vaccination 04/01/2020, 04/22/2020   Pneumococcal Polysaccharide-23 05/08/2007     Objective: Vital Signs: There were no vitals taken for this visit.   Physical Exam   Musculoskeletal Exam: ***  CDAI Exam: CDAI Score: -- Patient Global: --; Provider Global: -- Swollen: --; Tender: -- Joint Exam 09/10/2024   No joint exam has been documented for this visit   There is currently no information documented on the homunculus. Go to the Rheumatology activity and complete the homunculus joint  exam.  Investigation: No additional findings.  Imaging: No results found.  Recent Labs: Lab Results  Component Value Date   WBC 4.0 07/21/2024   HGB 11.9 07/21/2024   PLT 264 07/21/2024   NA 140 07/21/2024   K 4.0 07/21/2024   CL 101 07/21/2024   CO2 24 07/21/2024   GLUCOSE 103 (H) 07/21/2024   BUN 13 07/21/2024   CREATININE 1.10 (H) 07/21/2024   BILITOT 0.3 07/21/2024   ALKPHOS 104 07/21/2024   AST 33 07/21/2024   ALT 30 07/21/2024   PROT 7.3 07/21/2024   ALBUMIN  4.1 07/21/2024   CALCIUM 9.8 07/21/2024   GFRAA 92 06/10/2020   QFTBGOLDPLUS Negative 07/21/2024    Speciality Comments: TB Gold: 05/07/2022 Neg   Prior therapy includes: methotrexate and Plaquenil (neutropenia) and Humira  (painful injections)  Procedures:  No procedures performed Allergies: Ceftin [cefuroxime axetil], Humira  [adalimumab ], and Methotrexate and trimetrexate   Assessment / Plan:     Visit Diagnoses: No diagnosis found.  Orders: No orders of the defined types were placed in this encounter.  No orders of the defined types were placed in this encounter.   Face-to-face time spent with patient was *** minutes. Greater than 50% of time was spent in counseling and coordination of care.  Follow-Up Instructions: No follow-ups on file.   Shelba SHAUNNA Potters, RT  Note - This record has been created using [employer].  Chart creation errors have been sought, but may not always  have been located. Such creation errors do not reflect on  the standard of medical care.    [1]  Social History Tobacco Use   Smoking status: Never    Passive exposure: Never   Smokeless tobacco: Never  Vaping Use   Vaping status: Never Used  Substance Use Topics   Alcohol use: No   Drug use: Never   "

## 2024-09-10 ENCOUNTER — Ambulatory Visit: Payer: Self-pay | Admitting: Physician Assistant

## 2024-09-10 DIAGNOSIS — Z8719 Personal history of other diseases of the digestive system: Secondary | ICD-10-CM

## 2024-09-10 DIAGNOSIS — R748 Abnormal levels of other serum enzymes: Secondary | ICD-10-CM

## 2024-09-10 DIAGNOSIS — M0579 Rheumatoid arthritis with rheumatoid factor of multiple sites without organ or systems involvement: Secondary | ICD-10-CM

## 2024-09-10 DIAGNOSIS — Z8679 Personal history of other diseases of the circulatory system: Secondary | ICD-10-CM

## 2024-09-10 DIAGNOSIS — Z79899 Other long term (current) drug therapy: Secondary | ICD-10-CM

## 2024-09-10 DIAGNOSIS — M797 Fibromyalgia: Secondary | ICD-10-CM

## 2024-09-10 DIAGNOSIS — R5383 Other fatigue: Secondary | ICD-10-CM

## 2024-09-10 DIAGNOSIS — Z87898 Personal history of other specified conditions: Secondary | ICD-10-CM

## 2024-09-10 DIAGNOSIS — R7989 Other specified abnormal findings of blood chemistry: Secondary | ICD-10-CM

## 2024-09-10 DIAGNOSIS — M47816 Spondylosis without myelopathy or radiculopathy, lumbar region: Secondary | ICD-10-CM

## 2024-09-23 ENCOUNTER — Ambulatory Visit: Admitting: Physician Assistant

## 2024-09-30 ENCOUNTER — Ambulatory Visit: Admitting: Family Medicine

## 2024-10-15 ENCOUNTER — Ambulatory Visit: Payer: Self-pay | Admitting: Family Medicine
